# Patient Record
Sex: Female | Born: 1935 | Race: White | Hispanic: No | Smoking: Former smoker
Health system: Southern US, Community
[De-identification: ages and names within clinical notes are randomized; demographics above are authoritative.]

## PROBLEM LIST (undated history)

## (undated) DIAGNOSIS — C50919 Malignant neoplasm of unspecified site of unspecified female breast: Secondary | ICD-10-CM

## (undated) DIAGNOSIS — R200 Anesthesia of skin: Secondary | ICD-10-CM

## (undated) DIAGNOSIS — Z8489 Family history of other specified conditions: Secondary | ICD-10-CM

## (undated) DIAGNOSIS — E785 Hyperlipidemia, unspecified: Secondary | ICD-10-CM

## (undated) DIAGNOSIS — M719 Bursopathy, unspecified: Secondary | ICD-10-CM

## (undated) DIAGNOSIS — F419 Anxiety disorder, unspecified: Secondary | ICD-10-CM

## (undated) DIAGNOSIS — I493 Ventricular premature depolarization: Secondary | ICD-10-CM

## (undated) DIAGNOSIS — M67919 Unspecified disorder of synovium and tendon, unspecified shoulder: Secondary | ICD-10-CM

## (undated) DIAGNOSIS — R001 Bradycardia, unspecified: Secondary | ICD-10-CM

## (undated) DIAGNOSIS — I10 Essential (primary) hypertension: Secondary | ICD-10-CM

## (undated) DIAGNOSIS — IMO0002 Reserved for concepts with insufficient information to code with codable children: Secondary | ICD-10-CM

## (undated) DIAGNOSIS — J189 Pneumonia, unspecified organism: Secondary | ICD-10-CM

## (undated) DIAGNOSIS — M1710 Unilateral primary osteoarthritis, unspecified knee: Secondary | ICD-10-CM

## (undated) DIAGNOSIS — M216X9 Other acquired deformities of unspecified foot: Secondary | ICD-10-CM

## (undated) DIAGNOSIS — N390 Urinary tract infection, site not specified: Secondary | ICD-10-CM

## (undated) DIAGNOSIS — J449 Chronic obstructive pulmonary disease, unspecified: Secondary | ICD-10-CM

## (undated) DIAGNOSIS — M2170 Unequal limb length (acquired), unspecified site: Secondary | ICD-10-CM

## (undated) DIAGNOSIS — M21069 Valgus deformity, not elsewhere classified, unspecified knee: Secondary | ICD-10-CM

## (undated) HISTORY — DX: Valgus deformity, not elsewhere classified, unspecified knee: M21.069

## (undated) HISTORY — DX: Unilateral primary osteoarthritis, unspecified knee: M17.10

## (undated) HISTORY — DX: Ventricular premature depolarization: I49.3

## (undated) HISTORY — DX: Hyperlipidemia, unspecified: E78.5

## (undated) HISTORY — DX: Bradycardia, unspecified: R00.1

## (undated) HISTORY — PX: APPENDECTOMY: SHX54

## (undated) HISTORY — PX: JOINT REPLACEMENT: SHX530

## (undated) HISTORY — PX: TOTAL HIP ARTHROPLASTY: SHX124

## (undated) HISTORY — DX: Unspecified disorder of synovium and tendon, unspecified shoulder: M67.919

## (undated) HISTORY — PX: BREAST SURGERY: SHX581

## (undated) HISTORY — DX: Unspecified disorder of synovium and tendon, unspecified shoulder: M71.9

## (undated) HISTORY — DX: Reserved for concepts with insufficient information to code with codable children: IMO0002

## (undated) HISTORY — DX: Unequal limb length (acquired), unspecified site: M21.70

## (undated) HISTORY — DX: Malignant neoplasm of unspecified site of unspecified female breast: C50.919

## (undated) HISTORY — DX: Other acquired deformities of unspecified foot: M21.6X9

---

## 1989-01-17 HISTORY — PX: BREAST LUMPECTOMY: SHX2

## 2002-10-24 ENCOUNTER — Other Ambulatory Visit: Admission: RE | Admit: 2002-10-24 | Discharge: 2002-10-24 | Payer: Self-pay | Admitting: Physical Therapy

## 2004-01-02 ENCOUNTER — Emergency Department (HOSPITAL_COMMUNITY): Admission: EM | Admit: 2004-01-02 | Discharge: 2004-01-02 | Payer: Self-pay | Admitting: Emergency Medicine

## 2004-01-18 HISTORY — PX: NASAL SINUS SURGERY: SHX719

## 2004-03-11 ENCOUNTER — Encounter: Admission: RE | Admit: 2004-03-11 | Discharge: 2004-03-11 | Payer: Self-pay | Admitting: Obstetrics and Gynecology

## 2004-12-03 ENCOUNTER — Other Ambulatory Visit: Admission: RE | Admit: 2004-12-03 | Discharge: 2004-12-03 | Payer: Self-pay | Admitting: Obstetrics and Gynecology

## 2005-03-24 ENCOUNTER — Encounter: Admission: RE | Admit: 2005-03-24 | Discharge: 2005-03-24 | Payer: Self-pay | Admitting: Internal Medicine

## 2005-09-27 ENCOUNTER — Encounter: Admission: RE | Admit: 2005-09-27 | Discharge: 2005-09-27 | Payer: Self-pay | Admitting: Orthopedic Surgery

## 2005-11-21 ENCOUNTER — Encounter: Admission: RE | Admit: 2005-11-21 | Discharge: 2005-11-21 | Payer: Self-pay | Admitting: Obstetrics and Gynecology

## 2005-12-21 ENCOUNTER — Encounter: Admission: RE | Admit: 2005-12-21 | Discharge: 2005-12-21 | Payer: Self-pay | Admitting: *Deleted

## 2006-01-18 ENCOUNTER — Ambulatory Visit: Admission: RE | Admit: 2006-01-18 | Discharge: 2006-01-18 | Payer: Self-pay | Admitting: Orthopedic Surgery

## 2006-03-23 ENCOUNTER — Encounter: Admission: RE | Admit: 2006-03-23 | Discharge: 2006-03-23 | Payer: Self-pay | Admitting: Obstetrics and Gynecology

## 2006-04-27 ENCOUNTER — Encounter (INDEPENDENT_AMBULATORY_CARE_PROVIDER_SITE_OTHER): Payer: Self-pay | Admitting: Specialist

## 2006-04-27 ENCOUNTER — Observation Stay (HOSPITAL_COMMUNITY): Admission: RE | Admit: 2006-04-27 | Discharge: 2006-04-28 | Payer: Self-pay | Admitting: Otolaryngology

## 2006-05-02 ENCOUNTER — Encounter: Admission: RE | Admit: 2006-05-02 | Discharge: 2006-05-02 | Payer: Self-pay | Admitting: Otolaryngology

## 2006-05-09 ENCOUNTER — Emergency Department (HOSPITAL_COMMUNITY): Admission: EM | Admit: 2006-05-09 | Discharge: 2006-05-09 | Payer: Self-pay | Admitting: Emergency Medicine

## 2006-05-17 ENCOUNTER — Encounter: Admission: RE | Admit: 2006-05-17 | Discharge: 2006-05-17 | Payer: Self-pay | Admitting: Otolaryngology

## 2006-09-25 ENCOUNTER — Inpatient Hospital Stay (HOSPITAL_COMMUNITY): Admission: RE | Admit: 2006-09-25 | Discharge: 2006-09-28 | Payer: Self-pay | Admitting: Orthopedic Surgery

## 2007-03-26 ENCOUNTER — Encounter: Admission: RE | Admit: 2007-03-26 | Discharge: 2007-03-26 | Payer: Self-pay | Admitting: Obstetrics and Gynecology

## 2008-04-09 ENCOUNTER — Encounter: Admission: RE | Admit: 2008-04-09 | Discharge: 2008-04-09 | Payer: Self-pay | Admitting: Obstetrics and Gynecology

## 2008-11-18 ENCOUNTER — Ambulatory Visit: Payer: Self-pay | Admitting: Vascular Surgery

## 2008-11-18 ENCOUNTER — Ambulatory Visit: Admission: RE | Admit: 2008-11-18 | Discharge: 2008-11-18 | Payer: Self-pay | Admitting: Orthopedic Surgery

## 2008-11-18 ENCOUNTER — Encounter (INDEPENDENT_AMBULATORY_CARE_PROVIDER_SITE_OTHER): Payer: Self-pay | Admitting: Orthopedic Surgery

## 2009-01-17 DIAGNOSIS — R200 Anesthesia of skin: Secondary | ICD-10-CM

## 2009-01-17 HISTORY — DX: Anesthesia of skin: R20.0

## 2009-02-24 ENCOUNTER — Inpatient Hospital Stay (HOSPITAL_COMMUNITY): Admission: RE | Admit: 2009-02-24 | Discharge: 2009-02-27 | Payer: Self-pay | Admitting: Orthopedic Surgery

## 2009-04-27 ENCOUNTER — Encounter: Admission: RE | Admit: 2009-04-27 | Discharge: 2009-04-27 | Payer: Self-pay | Admitting: Obstetrics and Gynecology

## 2009-04-27 DIAGNOSIS — J45909 Unspecified asthma, uncomplicated: Secondary | ICD-10-CM | POA: Insufficient documentation

## 2009-09-01 ENCOUNTER — Ambulatory Visit: Payer: Self-pay | Admitting: Sports Medicine

## 2009-09-01 DIAGNOSIS — M719 Bursopathy, unspecified: Secondary | ICD-10-CM

## 2009-09-01 DIAGNOSIS — M2170 Unequal limb length (acquired), unspecified site: Secondary | ICD-10-CM

## 2009-09-01 DIAGNOSIS — M21069 Valgus deformity, not elsewhere classified, unspecified knee: Secondary | ICD-10-CM | POA: Insufficient documentation

## 2009-09-01 DIAGNOSIS — M1710 Unilateral primary osteoarthritis, unspecified knee: Secondary | ICD-10-CM

## 2009-09-01 DIAGNOSIS — M67919 Unspecified disorder of synovium and tendon, unspecified shoulder: Secondary | ICD-10-CM | POA: Insufficient documentation

## 2009-09-01 DIAGNOSIS — M216X9 Other acquired deformities of unspecified foot: Secondary | ICD-10-CM

## 2009-10-08 ENCOUNTER — Ambulatory Visit: Payer: Self-pay | Admitting: Sports Medicine

## 2010-02-07 ENCOUNTER — Encounter: Payer: Self-pay | Admitting: Otolaryngology

## 2010-02-07 ENCOUNTER — Encounter: Payer: Self-pay | Admitting: Obstetrics and Gynecology

## 2010-02-16 NOTE — Assessment & Plan Note (Signed)
Summary: NP,B KNEE ISSUES AND L SHOULDER PAIN   Vital Signs:  Patient profile:   77 year old female Height:      65 inches Weight:      144 pounds BMI:     24.05 BP sitting:   112 / 71  Vitals Entered By: Lillia Pauls CMA (September 01, 2009 9:50 AM)  CC:  Knee Pain.  History of Present Illness: 77 yo F:  1. Knee Pain: bilateral, L > R, x several years. + catching and locking, especially with going down stairs. Hx of left knee arthroscopy. last xray at Dr. Nilsa Nutting office - "not bone on bone yet." patient is very active - exercises at gym 4 days a week, has trainer 2 days a week. some night pain. PMHx of bilateral knee replacements, with resulting shorter left leg for which she wears a lift "most of the time." has had hyaluronic acid injections in left knee.   2. Shoulder Pain: right side, with decreased ROM, was seen by Dr. Charlann Boxer and given an injection about 4 months ago which helped. she is doing exercises prescribed by him for this. no Hx of xrays.  Allergies (verified): No Known Drug Allergies  Review of Systems MS:  Complains of joint pain and joint swelling; denies joint redness, loss of strength, low back pain, and muscle weakness. Neuro:  Denies falling down, numbness, and tingling.  Physical Exam  General:  Well-developed, well-nourished, in no acute distress; alert, appropriate and cooperative throughout examination. Vitals reviewed. Msk:  Knees Inspection: obvious arthritis changes bilaterally. Palpation: mild tenderness over medial joint lines bilaterally L>R. +crepitus bilaterally. ROM: FROM knees and hips. Ligaments: Normal endpoints. Special: by mouth felt with McMurrays on right but no pain. Standing. Genu valgus. + shorter left leg.   Feet: bilateral mild pes planus, bilateral splayed toes 2-3 and forefoot collapse, bilateral pronation.  Left Shoulder Inspection reveals no abnormalities, atrophy or asymmetry. Palpation:  no tenderness over AC joint or bicipital  groove. ROM: decreased abduction and external rotation. Rotator cuff strength decreased external rotation and abduction. No signs of impingement with negative Neer and Hawkin's tests, empty can. Speeds and Yergason's tests normal. No painful arc and no drop arm sign. No apprehension sign.      Impression & Recommendations:  Problem # 1:  DEGENERATIVE JOINT DISEASE, KNEE (ICD-715.96) see instructions  I think we should modify exercises and try to provide better support to lessen leg length diffence and degree of left genu valgum  Problem # 2:  UNEQUAL LEG LENGTH (ICD-736.81) will add lift ot shoes and develop a custom orthotic to correct  Problem # 3:  OTHER ACQUIRED DEFORMITY OF ANKLE AND FOOT OTHER (ICD-736.79) lots of fooot breakdwon consider orthotics  Problem # 4:  GENU VALGUM (ICD-736.41) 2/2 DJD but will try to lessen stress with wedges lifts in other shoes  Problem # 5:  ROTATOR CUFF SYNDROME, LEFT (ICD-726.10)  Patient Instructions: 1)  Exercises to do daily: See Handout 2)  Try the new heel lift for a few weeks. Come back for custom orthotics. We want to try to correct more of your leg length difference. Bring shoes that you like to wear at the next visit. 3)  Alternate 2 aerobic execises - treadmill, semi-recumbent bike (works your quads), elliptical (works your hips). Don't use an upright bike.  4)  Don't do full squats. No full quad extension. 5)  Keep knee flexion in the 30 degree range - no more that 45 degrees. 6)  Avoid the bent knee quad extension exercises with weights.

## 2010-02-16 NOTE — Assessment & Plan Note (Signed)
Summary: F/U,MC   Vital Signs:  Patient profile:   77 year old female BP sitting:   136 / 80  Vitals Entered By: Lillia Pauls CMA (October 08, 2009 3:16 PM)  History of Present Illness: Patient returns for follow  up  Left shoulder pain:  working motion  and definitely has improved this works with trainer who  is helping a lot does the home motion exercises pain is 30% less  Bilat knee pain wearing lift on left this has helped reduce knee pain w walking and standing avoiding any exercises w deep knee bend and this helps feels that recumbent bike has helped reduce pain at least 20%  Allergies: No Known Drug Allergies  Physical Exam  General:  Well-developed,well-nourished,in no acute distress; alert,appropriate and cooperative throughout examination Msk:  Left shoulder now within 10 deg of full flexion  can do full abduction and  elevation with some rotation of left scapula but mostly pain free IR is only slt limited on back scratch on left  ER this is 20 deg more but still somewhat uncomfortabel when she is abducted and ER  both knees show crepitation but less pain on testing  gait shows genu valgus bilat this improves with wege for relative leg length diff     Impression & Recommendations:  Problem # 1:  ROTATOR CUFF SYNDROME, LEFT (ICD-726.10) mproving keep working ROM w codman exercises  Problem # 2:  GENU VALGUM (ICD-736.41) lessen as much s possible w shoe inserts  doing well so we withheld going to custom orthotics today and will keep using lifts  Problem # 3:  UNEQUAL LEG LENGTH (ICD-736.81) felt lifts added to heels lift with taper added to left shoe and will give more of these when they come in  reck this in 4 mos

## 2010-03-19 ENCOUNTER — Other Ambulatory Visit: Payer: Self-pay | Admitting: Obstetrics and Gynecology

## 2010-03-19 DIAGNOSIS — Z1231 Encounter for screening mammogram for malignant neoplasm of breast: Secondary | ICD-10-CM

## 2010-03-22 ENCOUNTER — Inpatient Hospital Stay (HOSPITAL_BASED_OUTPATIENT_CLINIC_OR_DEPARTMENT_OTHER)
Admission: RE | Admit: 2010-03-22 | Discharge: 2010-03-22 | Disposition: A | Discharge status: Home / Self Care | Payer: Medicare Other | Source: Ambulatory Visit | Attending: Cardiology | Admitting: Cardiology

## 2010-03-22 DIAGNOSIS — R0989 Other specified symptoms and signs involving the circulatory and respiratory systems: Secondary | ICD-10-CM | POA: Insufficient documentation

## 2010-03-22 DIAGNOSIS — R0609 Other forms of dyspnea: Secondary | ICD-10-CM | POA: Insufficient documentation

## 2010-03-22 DIAGNOSIS — R9439 Abnormal result of other cardiovascular function study: Secondary | ICD-10-CM | POA: Insufficient documentation

## 2010-03-22 DIAGNOSIS — R0602 Shortness of breath: Secondary | ICD-10-CM | POA: Insufficient documentation

## 2010-03-22 DIAGNOSIS — I10 Essential (primary) hypertension: Secondary | ICD-10-CM | POA: Insufficient documentation

## 2010-03-26 NOTE — Procedures (Signed)
NAME:  [name] [name], [name] [name]              ACCOUNT NO.:  [id number]  MEDICAL RECORD NO.:  [id number]           PATIENT TYPE:  LOCATION:                                 FACILITY:  PHYSICIAN:  Armanda Magic, M.D.          DATE OF BIRTH:  DATE OF PROCEDURE:  03/22/2010 DATE OF DISCHARGE:                           CARDIAC CATHETERIZATION   REFERRING PHYSICIAN:  Mark A. Perini, MD  PROCEDURES: 1. Left heart catheterization. 2. Coronary angiography. 3. Left ventriculography.  OPERATOR:  Armanda Magic, MD  INDICATION:  Shortness of breath.  COMPLICATIONS:  None.  INTRAVENOUS ACCESS:  Via right femoral artery, 4-French sheath.  INTRAVENOUS MEDICATIONS:  Versed 1 mg and fentanyl 25 mcg.  This is a 77 year old female, who has a history of asthma in the past as well as PVCs and hypertension, who has been having some mild dyspnea on exertion.  Her nuclear stress test showed a very small defect in the anterior apical region, felt most consistent with a breast attenuation artifact, but because of her ongoing shortness of breath, she now presents for cardiac catheterization.  The patient was brought to the cardiac catheterization laboratory in a fasting nonsedated state.  Informed consent was obtained.  The patient was connected to continuous heart rate and pulse oximetry monitoring and blood pressure monitoring.  The right groin was prepped and draped in a sterile fashion.  A 1% Xylocaine was used for local anesthesia.  Using modified Seldinger technique, a 4-French sheath was placed in the right femoral artery.  Under fluoroscopic guidance, a 4-French JL-4 catheter was placed in the left coronary artery.  Multiple cine films were taken at 30-degree RAO and 40-degree LAO views.  This catheter was then exchanged out over a guidewire for a 4-French 3DRC catheter successfully engaging the right coronary ostium.  Multiple cine films were taken at 30-degree RAO and 40-degree LAO views.  This  catheter was then exchanged out over a guidewire for a 4-French angled pigtail catheter which was placed under fluoroscopic guidance in the left ventricular cavity.  The left ventriculography was performed in the 30-degree RAO view using total 25 mL of contrast at 12 mL per second.  The catheter was then pulled back across the aortic valve with no significant gradient noted. At the end of the procedure, all catheters and sheaths were removed. Manual compression was performed until adequate hemostasis was obtained. The patient was transferred back to room in stable condition.  RESULTS:  Left main coronary artery is widely patent and bifurcates into left anterior descending artery and left circumflex artery.  Left anterior descending artery is widely patent throughout its course and gives rise to a large first diagonal branch which is widely patent throughout its course.  Left circumflex is widely patent throughout its course in the AV groove. It gives rise to a high obtuse marginal branch 1 which is a very large vessel and is widely patent and bifurcates into two daughter vessels, both of which are widely patent.  The ongoing circumflex gives rise to a second small obtuse marginal branch and terminates in a third large obtuse marginal  branch which is widely patent.  The right coronary artery is widely patent and bifurcates distally into a posterior descending artery and posterior lateral artery, both of which are widely patent.  Left ventricular pressure 123/11 mmHg, aortic pressure 121/53 mmHg, LVEDP 18 mmHg, LV function normal with EF 55%.  ASSESSMENT: 1. Normal coronary arteries. 2. Normal left ventricular function. 3. Mildly elevated left ventricular end-diastolic pressure with     diastolic dysfunction.  PLAN:  Discharge home after IV fluid and bedrest.  We will continue current medications and add a low dose of hydrochlorothiazide 25 mg daily to help with her elevated  LVEDP.  I am going to drop back her lisinopril to just 10 mg a day from 15 mg a day because her blood pressure has been borderline low in the past and at the last office visit, her blood pressure was 104/58 mmHg.  She will remain on diltiazem 180 mg daily and Toprol-XL 25 mg 3 tablets daily, both for treatment of diastolic dysfunction as well as for hypertension control.  Again, we will drop her lisinopril back 10 mg one tablet a day and start hydrochlorothiazide 25 mg a day.  She will follow up with my nurse practitioner in 2 weeks.     Armanda Magic, M.D.     TT/MEDQ  D:  03/22/2010  T:  03/22/2010  Job:  [id number]  cc:   Loraine Leriche A. Perini, M.D.  Electronically Signed by Armanda Magic M.D. on 03/25/2010 04:20:00 PM

## 2010-03-30 ENCOUNTER — Other Ambulatory Visit: Payer: Self-pay | Admitting: Obstetrics and Gynecology

## 2010-04-02 ENCOUNTER — Ambulatory Visit
Admission: RE | Admit: 2010-04-02 | Discharge: 2010-04-02 | Disposition: A | Discharge status: Home / Self Care | Payer: Medicare Other | Source: Ambulatory Visit | Attending: Obstetrics and Gynecology | Admitting: Obstetrics and Gynecology

## 2010-04-02 ENCOUNTER — Other Ambulatory Visit: Payer: Self-pay | Admitting: Obstetrics and Gynecology

## 2010-04-02 DIAGNOSIS — N6459 Other signs and symptoms in breast: Secondary | ICD-10-CM

## 2010-04-07 LAB — CBC
HCT: 29 % — ABNORMAL LOW (ref 36.0–46.0)
HCT: 29.7 % — ABNORMAL LOW (ref 36.0–46.0)
Hemoglobin: 10.2 g/dL — ABNORMAL LOW (ref 12.0–15.0)
Hemoglobin: 14.2 g/dL (ref 12.0–15.0)
MCHC: 34 g/dL (ref 30.0–36.0)
MCV: 97.2 fL (ref 78.0–100.0)
MCV: 97.6 fL (ref 78.0–100.0)
Platelets: 140 10*3/uL — ABNORMAL LOW (ref 150–400)
Platelets: 159 10*3/uL (ref 150–400)
Platelets: 251 10*3/uL (ref 150–400)
RBC: 4.34 MIL/uL (ref 3.87–5.11)
RDW: 13.4 % (ref 11.5–15.5)
WBC: 9.3 10*3/uL (ref 4.0–10.5)
WBC: 9.5 10*3/uL (ref 4.0–10.5)

## 2010-04-07 LAB — BASIC METABOLIC PANEL
BUN: 13 mg/dL (ref 6–23)
BUN: 29 mg/dL — ABNORMAL HIGH (ref 6–23)
BUN: 8 mg/dL (ref 6–23)
CO2: 30 mEq/L (ref 19–32)
Chloride: 105 mEq/L (ref 96–112)
Chloride: 107 mEq/L (ref 96–112)
Chloride: 99 mEq/L (ref 96–112)
Creatinine, Ser: 0.71 mg/dL (ref 0.4–1.2)
Creatinine, Ser: 0.94 mg/dL (ref 0.4–1.2)
GFR calc Af Amer: >60 mL/min (ref >60)
GFR calc non Af Amer: >60 mL/min (ref >60)
Glucose, Bld: 128 mg/dL — ABNORMAL HIGH (ref 70–99)
Glucose, Bld: 86 mg/dL (ref 70–99)
Glucose, Bld: 93 mg/dL (ref 70–99)
Potassium: 4.4 mEq/L (ref 3.5–5.1)
Potassium: 4.4 mEq/L (ref 3.5–5.1)
Potassium: 4.7 mEq/L (ref 3.5–5.1)
Sodium: 135 mEq/L (ref 135–145)
Sodium: 138 mEq/L (ref 135–145)

## 2010-04-07 LAB — DIFFERENTIAL
Basophils Absolute: 0 10*3/uL (ref 0.0–0.1)
Basophils Relative: 0 % (ref 0–1)
Eosinophils Absolute: 0.1 10*3/uL (ref 0.0–0.7)
Lymphocytes Relative: 16 % (ref 12–46)
Lymphs Abs: 1.4 10*3/uL (ref 0.7–4.0)
Neutro Abs: 6.9 10*3/uL (ref 1.7–7.7)

## 2010-04-07 LAB — URINALYSIS, ROUTINE W REFLEX MICROSCOPIC
Nitrite: NEGATIVE
pH: 6 (ref 5.0–8.0)

## 2010-04-07 LAB — URINE MICROSCOPIC-ADD ON

## 2010-04-07 LAB — PROTIME-INR
INR: 0.99 (ref 0.00–1.49)
Prothrombin Time: 13 seconds (ref 11.6–15.2)

## 2010-05-03 ENCOUNTER — Ambulatory Visit: Payer: Medicare Other

## 2010-05-03 ENCOUNTER — Ambulatory Visit
Admission: RE | Admit: 2010-05-03 | Discharge: 2010-05-03 | Disposition: A | Discharge status: Home / Self Care | Payer: Medicare Other | Source: Ambulatory Visit | Attending: Obstetrics and Gynecology | Admitting: Obstetrics and Gynecology

## 2010-05-03 ENCOUNTER — Other Ambulatory Visit: Payer: Self-pay | Admitting: Diagnostic Radiology

## 2010-05-03 ENCOUNTER — Other Ambulatory Visit: Payer: Self-pay | Admitting: Obstetrics and Gynecology

## 2010-05-03 DIAGNOSIS — N6459 Other signs and symptoms in breast: Secondary | ICD-10-CM

## 2010-06-01 NOTE — Op Note (Signed)
NAME:  [name] [name], [name] [name]              ACCOUNT NO.:  [id number]   MEDICAL RECORD NO.:  [id number]          PATIENT TYPE:  INP   LOCATION:  1616                         FACILITY:  [facility]   PHYSICIAN:  Madlyn Frankel. Charlann Boxer, M.D.  DATE OF BIRTH:  [birth year]   DATE OF PROCEDURE:  09/25/2006  DATE OF DISCHARGE:                               OPERATIVE REPORT   PREOPERATIVE DIAGNOSIS:  Left hip end-stage osteoarthritis with large  acetabular cyst.   POSTOPERATIVE DIAGNOSIS:  Left hip end-stage osteoarthritis with large  acetabular cyst.   PROCEDURE:  Left total hip replacement associated with autograft of the  pelvis of the acetabular cyst.   COMPONENTS USED:  DePuy hip system with a size 54 pinnacle cup, a 32  neutral Marathon liner, a 6 high offset Tri-Lock stem with a 32+1 Delta  screw with ball.   SURGEON:  Charlann Boxer.   ASSISTANT:  Dwyane Luo, P.A.C.   ANESTHESIA:  Spinal plus MAC.   BLOOD LOSS:  250.   COMPLICATIONS:  None.   DRAINS:  None.   INDICATIONS FOR PROCEDURE:  [name] [name] is a 77 year old female who had  been followed for left hip arthritis for a couple of years.  She had  been trying to delay it as much as possible.  She decided about 6 to 8  months ago that she wanted to have this done but was having problems  with chronic sinuses.  Once this was addressed and treated  appropriately, she wished to proceed with this as soon as possible.  We  had reviewed the risks of infection, dislocation, DVT, component  failure, need for revision surgery.  All prior consent was obtained for  this.   PROCEDURE IN DETAIL:  The patient was brought to the operative theater.  Once adequate anesthesia and preoperative antibiotics administered, 1 g  of Ancef, the patient was positioned in the right lateral decubitus  position with the left side up.  The left lower extremity was then  prescrubbed and prepped and draped in a sterile fashion.   A lateral-based incision was made for a posterior  approach to the hip.  The iliotibial band and gluteus fascia were incised posteriorly.  The  short external rotators were identified and taken down separately from  the posterior capsule.  A L capsulotomy was then created and the hip  dislocated.  Landmarks were identified and then based off of  preoperative templating and the anatomic landmarks a neck osteotomy was  made.   Attention was first directed to the femur with starting reamer followed  by a hand reamer, then irrigating the canal to prevent fat emboli.  I  began broaching with a 0 broach and carried it all the way up to a size  6.  The size 6 sat a millimeter or two beneath my neck cut and on the  medial aspect I used a calcar planar to finish this off.  The femoral  canal was then packed and attention directed to the acetabulum.  Acetabular reaming began with a 45 reamer and it was carried up to a 51  reamer initially.  There was a large acetabular cyst noted radiographically and this was  identified intraoperatively.  There was a very large cyst measuring  about 1.5 cm wide and then probably about 1 cm deep.  I curetted this  out, removed all cystic lining.  I irrigated it and then packed it with  femoral head allograft and reamed femoral head out autograft.   This was packed and significant bone graft utilized.  At this point I  reamed again with a 53 reamer, utilized some of this reaming to fill in  further.   At this point a 54 pinnacle cup was impacted at 40 degrees of abduction  and at 20 degrees of forward flexion, anatomically positioned  posteriorly anteriorly beneath the anterior rim.   At this point a single cancellous screw was placed.  I then placed a  neutral Marathon liner.  Attention was now redirected back to the femur  where the fixed high offset stem was then impacted.  A trial reduction  was carried out with the high offset stem 32+1 ball.  Hip range of  motion was noted to be very stable, tolerating  internal rotation to 80  degrees.  There was a few millimeters of shuck, but the patient did have  spinal anesthesia and compared to her contralateral leg her leg lengths  were unchanged.  Patient was stable with extension on external rotation  as well as with abduction on external rotation.  At this point trial  components were removed and the hole eliminator placed.  The cup was  irrigated and the final 32 neutral Marathon liner placed.  On the  femoral side the 6 high offset stem was then impacted to the level with  neck cut and based on this and my trial reduction, a 32+1 Delta ceramic  ball was impacted onto the trunnion and the hip reduced.  The hip was  irrigated.  The posterior capsule was reapproximated superiorly using #1  Ethibond.  FloSeal 5 mL was injected into the posterior capsular  tissues.  The iliotibial band was reapproximated using #1 Ethibond, #1  Vicryl was then run in the gluteal fascia.  The remainder was closed  with #2-0 Vicryl and a running #4-0 Monocryl.  The hip was cleaned,  dried and dressed sterilely with Steri-Strips and a Mepilex dressing.  She was brought to the recovery room in stable condition.      Madlyn Frankel Charlann Boxer, M.D.  Electronically Signed     MDO/MEDQ  D:  09/25/2006  T:  09/25/2006  Job:  [id number]

## 2010-06-01 NOTE — Discharge Summary (Signed)
NAME:  [name] [name], [name] [name]              ACCOUNT NO.:  [id number]   MEDICAL RECORD NO.:  [id number]          PATIENT TYPE:  INP   LOCATION:  1616                         FACILITY:  [facility]   PHYSICIAN:  Madlyn Frankel. Charlann Boxer, M.D.  DATE OF BIRTH:  October 15, 1930   DATE OF ADMISSION:  09/25/2006  DATE OF DISCHARGE:  09/28/2006                               DISCHARGE SUMMARY   DISCHARGE DIAGNOSES:  1. Left hip osteoarthritis.  2. Anxiety.  3. Depression.  4. Hypertension.  5. Reflux disease.  6. Breast cancer.   PROCEDURE:  On September 25, 2006, the patient underwent total hip  replacement by Dr. Charlann Boxer.  Components used were DuPuy hip system.   HISTORY OF PRESENT ILLNESS:  [name] [name] is a 77 year old female who I  have followed in the office for a couple of years for left hip pain.  She had progressive worsening of symptoms, decreased quality of life.  Following workup for sinus issues, chronic sinus problems, she decided  to proceed with surgical intervention.  Risks and benefits have been  discussed and consent obtained prior to the hospitalization.   HOSPITAL COURSE:  The patient was admitted for same day surgery on  September 25, 2006.  She underwent a left total hip replacement which was  uncomplicated.  She was transferred to the orthopedic floor  postoperatively.  Based on the perioperative blood loss anemia, she did  require two units of blood on postoperative day #1 for a postoperative  hematocrit of 23.5.  On postoperative day #2, her hematocrit had  elevated appropriately to 29.5.  She was seen and evaluated by physical  therapy and mobilized without difficulty.  She had no hospital  complications.  On postoperative day #2 her dressing was changed and  found to be dry.  She remained neurovascularly intact, motor and  sensory.   Based on the social situation of her living alone, we opted for short  term nursing facility.  Case management was involved in helping with  placement.   DISCHARGE INSTRUCTIONS:  The patient will be weightbearing as tolerated  left lower extremity with a walker.  Hip precautions will be followed  for posterior hip surgery with no flexion greater than 90 degrees, no  internal rotation, and no adduction.   She will progress with a walker until further instructed in our office.   DISCHARGE MEDICATIONS:  1. Cardia XT 180 mg q.h.s.  2. Zoloft 50 mg q.a.m.  3. Metoprolol 25 mg three tablets in the morning.  4. Lipitor 10 mg q.a.m.  5. Lisinopril 10 mg 1-1/2 tablets q.a.m.  6. Vitamin D 50,000 International Units weekly on Monday.  7. Evista 60 mg q.a.m.  8. Vicodin 5/500 one to two tablets p.o. q.4-6 hours p.r.n. for pain.  9. Robaxin 500 mg p.o. q.i.d. p.r.n. for muscle spasms and pain.  10.Lovenox 40 mg subcu daily for a total of 11 days to end on      October 09, 2006, transitioning at that point to enteric-coated      aspirin 325 mg daily for four weeks.  11.Colace 100 mg p.o. b.i.d. p.r.n.  for constipation.  12.MiraLax 17 grams p.o. daily p.r.n. for constipation.  13.Iron sulfate 325 mg p.o. t.i.d. x3 weeks.   DISCHARGE INSTRUCTIONS:  She may bathe as long as her wound remains dry.  She will follow up in my office in two weeks for wound check and to  check on progress.  She knows to notify the office for any problems or  concerns that she may have including fevers over 102.5, wound drainage,  or persistent pain.      Madlyn Frankel Charlann Boxer, M.D.  Electronically Signed     MDO/MEDQ  D:  09/28/2006  T:  09/28/2006  Job:  [id number]

## 2010-06-01 NOTE — H&P (Signed)
NAME:  [name] [name], [name] [name]              ACCOUNT NO.:  [id number]   MEDICAL RECORD NO.:  [id number]          PATIENT TYPE:  INP   LOCATION:  NA                           FACILITY:  [facility]   PHYSICIAN:  Madlyn Frankel. Charlann Boxer, M.D.  DATE OF BIRTH:  Apr 20, 1930   DATE OF ADMISSION:  DATE OF DISCHARGE:                              HISTORY & PHYSICAL   DATE OF ADMISSION:  September 25, 2006.   PROCEDURE:  Left total hip arthroplasty.   CHIEF COMPLAINT:  Left hip pain.   HISTORY OF PRESENT ILLNESS:  A 77 year old female with a history of left  hip pain secondary to osteoarthritis.  It has been refractory to all  conservative treatments.  She has been presurgically assessed by Dr.  Waynard Edwards and Dr. Patterson Hammersmith.  She had been scheduled in the past, but due to  some persistent and chronic sinusitis, surgery had to be cancelled.  Prior to this surgery, Dr. Charlann Boxer spoke with Dr. Patterson Hammersmith and she was placed  on prophylactic Cipro before this surgery to alleviate any sinusitis.   PAST MEDICAL HISTORY:  Significant for:  1. Osteoarthritis.  2. Anxiety/depression.  3. Hypertension.  4. Reflux disease.  5. Breast cancer.   PAST SURGICAL HISTORY:  Include:  1. Arthroscopic left knee in 2007.  2. Sinus surgery 2008.  3. Breast lumpectomy.   FAMILY HISTORY:  Heart disease, hypertension, arthritis.   SOCIAL HISTORY:  Widowed.  Primary caregiver after surgery, she is  talking about using [facility] rather than SNF placement.   DRUG ALLERGIES:  No known drug allergies.   MEDICATIONS:  1. Toprol-XL 75 mg p.o. daily.  2. Zestril 15 mg p.o. daily.  3. Cardia 180 mg p.o. daily.  4. Zoloft 50 mg p.o. daily.  5. Lipitor 10 mg p.o. daily.  6. Evista p.o. daily.  7. Vitamin D 50,000 units once a week.  8. Cipro 750 mg p.o. b.i.d. 1 week prior to surgery.   REVIEW OF SYSTEMS:  PULMONARY:  History pneumonia 30 years ago.  GASTROINTESTINAL:  History of reflux and hemorrhoids sometimes.  HEMATOLOGY/ONCOLOGY:  Easily  bruises.  Otherwise, see HPI.   PHYSICAL EXAMINATION:  Pulse 72, respirations 18, blood pressure 138/82.  GENERAL:  Awake, alert and oriented, well-developed, well-nourished, no  acute distress.  NECK:  Supple.  No carotid bruits.  CHEST:  Lungs clear to auscultation bilaterally.  BREASTS:  Deferred.  HEART:  Regular rate and rhythm without gallops, clicks, rubs or  murmurs.  ABDOMEN:  Soft, nontender, nondistended.  Bowel sounds present.  GENITOURINARY:  Deferred.  EXTREMITIES:  Left hip has increased pain with internal range of motion.  SKIN:  She has an old bruise from a fall approximately six weeks ago  that is healing well.  NEUROLOGIC:  Intact distal sensibilities.   LABORATORIES:  CBC showed her hemoglobin and hematocrit be 13.2 and 38.3  respectively, platelets 251, white blood cells 5.8.  Basic metabolic:  Glucose 87, sodium 161, potassium 4.3, BUN 20, creatinine 0.8.  Liver  all within normal limits.  TSH 1.19.  UA negative.  EKG, chest x-ray  pending, including  urinalysis and coags.   IMPRESSION:  Left hip osteoarthritis.   PLAN OF ACTION:  Left total hip arthroplasty September 25, 2006, at  [facility] by surgeon Dr. Durene Romans.  Risks and  complications were discussed.  Questions were encouraged, answered and  reviewed.   At time of history and physical postoperative medications including  Lovenox, Robaxin, iron, aspirin, Colace and MiraLax provided.  Pain  medication will be provided at time of surgery.     ______________________________  [name] [name], [place]      Madlyn Frankel. Charlann Boxer, M.D.  Electronically Signed    BLM/MEDQ  D:  09/14/2006  T:  09/15/2006  Job:  [id number]   cc:   Loraine Leriche A. Perini, M.D.  Fax: [phone]

## 2010-06-04 NOTE — H&P (Signed)
NAME:  [name] [name], [name] [name]              ACCOUNT NO.:  [id number]   MEDICAL RECORD NO.:  [id number]          PATIENT TYPE:  INP   LOCATION:  NA                           FACILITY:  [facility]   PHYSICIAN:  Madlyn Frankel. Charlann Boxer, M.D.  DATE OF BIRTH:  1930/07/18   DATE OF ADMISSION:  01/24/2006  DATE OF DISCHARGE:                              HISTORY & PHYSICAL   PROCEDURE:  Left total hip replacement.   CHIEF COMPLAINT:  Left hip pain.   HISTORY OF PRESENT ILLNESS:  This is a 77 year old female with  persistent left hip pain secondary to degenerative joint disease.  Conservative measures have failed to provide significant relief.  She  also has a history of spinal stenosis and disk compression history as  well.  Due to the fact that there has been persistent pain and decreased  activities of daily living, she has elected to proceed forward with a  left total hip replacement due to the fact that there is no other  treatments that can provide significant relief.   PAST MEDICAL HISTORY:  1. Hypertension.  2. Anxiety.  3. Gastroesophageal reflux disease.  4. Urinary tract infection.  5. Breast cancer.  6. Osteoarthritis.  7. Postmenopausal.   PAST SURGICAL HISTORY:  Past surgical history includes a lumpectomy in  1994 and right knee arthroscopic surgery in July2007.   FAMILY HISTORY:  Coronary artery disease, hypertension.   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  Her niece  will be in town to help with her caregiving postoperatively.   DRUG ALLERGIES:  NO KNOWN DRUG ALLERGIES.   MEDICATIONS:  1. Toprol 100 mg one p.o. daily.  2. Zestril 15 mg one p.o. daily.  3. Cardizem 180 mg one p.o. daily.  4. Zoloft 50 mg one p.o. daily.  5. Lipitor 10 mg one p.o. daily.  6. Vitamin D 1000 with calcium.  7. Centrum Silver.  8. Flaxseed oil.   REVIEW OF SYSTEMS:  In the past 2 weeks she has had no new signs or  symptoms of any of her cardiovascular, respiratory,  gastrointestinal,  genitourinary, neurological, musculoskeletal systems.   PHYSICAL EXAMINATION:  VITAL SIGNS:  Temperature 98.3, pulse 60,  respirations 18, blood pressure 124/68.  GENERAL:  This is an awake, alert and oriented, well-developed, well-  nourished 77 year old female in no acute distress.  NECK:  No carotid bruits.  No lymphadenopathy.  CHEST:  Lungs clear to auscultation bilaterally.  BREASTS:  Deferred.  HEART:  Regular rate and rhythm without gallops, clicks, rubs or  murmurs.  ABDOMEN:  Soft, nontender, nondistended.  Bowel sounds present in all  four quadrants.  GENITOURINARY:  Deferred.  EXTREMITIES:  She has painful range of motion, diffuse tenderness.  SKIN:  Dorsalis pedis pulse is positive.  No skin breakdown.  NEUROLOGIC:  Intact sensibilities distally.   LABORATORIES:  Labs are pending January 18, 2006 [facility] presurgical  appointment.   X-rays were reviewed with the patient which show end-stage degenerative  joint disease of her left hip.   IMPRESSION:  Left hip advanced degenerative joint disease.  PLAN OF ACTION:  Left total hip replacement on January 24, 2006, which is  her birthday, by surgeon Dr. Durene Romans.  Risks and complications were  discussed.  All questions were encouraged, answered and reviewed.  Look  forward to treating this very pleasant 77 year old female.     ______________________________  Yetta Glassman. [address], [place]      Madlyn Frankel. Charlann Boxer, M.D.  Electronically Signed    BLM/MEDQ  D:  12/28/2005  T:  12/28/2005  Job:  [id number]

## 2010-06-04 NOTE — Consult Note (Signed)
NAME:  [name] [name], [name] [name]              ACCOUNT NO.:  [id number]   MEDICAL RECORD NO.:  [id number]          PATIENT TYPE:  EMS   LOCATION:  MAJO                         FACILITY:  [facility]   PHYSICIAN:  Antony Contras, MD     DATE OF BIRTH:  [birth year]   DATE OF CONSULTATION:  05/09/2006  DATE OF DISCHARGE:                                 CONSULTATION   REQUESTING SERVICE:  Emergency department.   CHIEF COMPLAINT:  Nose bleed.   HISTORY OF PRESENT ILLNESS:  The patient is a 77 year old white female  who underwent sinonasal surgery by Dr. Gerilyn Pilgrim on April 27, 2006 including  septoplasty, turbinate reductions, and bilateral sinus surgery.  She did  quite well after surgery until 4:30 this morning when she awoke with  severe bleeding from her right nose.  She came to the emergency  department where bleeding was controlled with cocaine pledgets.  Currently, she is not bleeding and has calmed down.  She has no  particular complaints presently except her nose is obstructed.   PAST MEDICAL HISTORY:  1. Hypertension.  2. Anxiety.  3. GERD.  4. UTI.  5. Breast cancer.  6. Osteoarthritis.   PAST SURGICAL HISTORY:  1. As above.  2. Lumpectomy.  3. Right knee laparoscopic surgery.   MEDICATIONS:  Toprol, Zestril, Cardizem, Zoloft, Lipitor, vitamin E,  centrum silver, flaxseed oil.   ALLERGIES:  NO KNOWN DRUG ALLERGIES.   FAMILY HISTORY:  Coronary artery disease, hypertension.   SOCIAL HISTORY:  The patient is widowed.  She denies smoking or alcohol  use.   REASON FOR ADMISSION:  Negative except as listed above.   PHYSICAL EXAMINATION:  VITAL SIGNS:  Pulse 72, blood pressure 138/77,  respirations 20.  GENERAL:  The patient is in no acute distress.  She is pleasant and  cooperative in the emergency department.  HEAD AND FACE:  There are no abnormalities of the head and face.  EARS:  External ears are normal.  External canals are patent.  NOSE:  The external nose is normal.  By anterior  rhinoscopy, there is  clot filling both nasal passages.  The left side clot was partially  removed with suction until no more could be removed effectively.  The  right side clot was not disturbed.  No active bleeding was seen.  Septum  is relatively midline.  MOUTH:  Oral cavity is normal.  There is a clot of blood in the  posterior pharynx.  NECK:  Neck is nontender with no abnormality.  LYMPHATICS:  There is no lymphadenopathy.  CRANIAL NERVES:  II-XII grossly intact.  VOICE:  Voice is normal.   LABORATORY DATA:  White blood count 9.9, hemoglobin 11, platelet count  232,000.  PT 12.8, INR 1, PTT 28.   ASSESSMENT:  1. The patient is a 77 year old white female with right epistaxis 12      days following sinonasal surgery.  2. Post-hemorrhagic anemia.   PLAN:  The bleeding has been effectively controlled by the emergency  room staff with cocaine pledgets.  I have instructed the patient to  completely avoid blowing for  the next couple of weeks.  I have asked her  to use Afrin nasal spray in both nasal passages twice daily for 3 days.  I have also asked her to use saline nasal spray every 4 hours in both  sides.  I have asked her to avoid strenuous activity.  She has a  scheduled appointment tomorrow for follow up regarding her sinonasal  surgery, and I have asked her to keep that appointment.  Regarding her  anemia, I have prescribed ferrous sulfate 325 mg three times a day for  one month, and folic acid 0.1 mg once daily for one month.  I have  instructed her to watch for signs of symptomatic anemia including light-  headedness, heart racing, and syncope.      Antony Contras, MD  Electronically Signed     DDB/MEDQ  D:  05/09/2006  T:  05/09/2006  Job:  [id number]

## 2010-06-04 NOTE — Op Note (Signed)
NAME:  [name] [name]              ACCOUNT NO.:  [id number]   MEDICAL RECORD NO.:  [id number]          PATIENT TYPE:  OBV   LOCATION:  3309                         FACILITY:  [facility]   PHYSICIAN:  Lucky Cowboy, MD         DATE OF BIRTH:  1931/01/06   DATE OF PROCEDURE:  04/27/2006  DATE OF DISCHARGE:                               OPERATIVE REPORT   PREOPERATIVE DIAGNOSES:  1. Left septal deviation.  2. Bilateral inferior turbinate hypertrophy.  3. Chronic bilateral frontal sinusitis.   POSTOPERATIVE DIAGNOSES:  1. Left septal deviation.  2. Bilateral inferior turbinate hypertrophy.  3. Chronic bilateral frontal sinusitis.   PROCEDURE:  Septoplasty, bilateral inferior turbinate reductions, left  total ethmoidectomy, right anterior ethmoidectomy, bilateral frontal  recess explorations, image guidance using Stealth system. Reduction of  both of the middle turbinates with reduction of right concha bullosa.   COMPLICATIONS:  None.   INDICATIONS:  The patient is a 77 year old female with development of  severe bilateral frontal pain and recent episode of profuse left-sided  epistaxis requiring pack placement.  CT scan in the office has revealed  substantial ongoing sinus infection despite extended medical therapy and  steroid therapy.  For these reasons, the above procedures are performed.   PROCEDURE:  The patient was taken to the operating room and placed on  the table in the supine position. She was then placed under endotracheal  anesthesia and rotated clockwise. The neck was gently rotated to the  right.  The septum was injected with 1% lidocaine with 1:100,000  epinephrine.  The face was prepped with Betadine and draped in the  sterile fashion.  After allowing time for vasoconstrictive effect, a  left hemitransfixion incision was made using a #15 blade.  Submucoperichondrial and mucoperiosteal flaps were elevated using a  Risk analyst and a Therapist, nutritional.  The bony  cartilaginous junction  was divided using a Therapist, nutritional and contralateral flaps elevated.  The septum was divided high with open Laren Boom forceps.  Inferiorly, it was taken down using Takahashi forceps.  The anterior  septum was quite deviated to the left as this was the location of the  maxillary crest.  The cartilaginous septum was disconnected from the  underlying bony crest.  The bony crest was partially taken down using a  4-mm osteotome.  This allowed the septum to remain deviated more toward  the midline.  At this point, the inferior 1-2 mm were taken down off of  the quadrangular  cartilage to allow permanent placement more towards  the midline. This allowed much better visualization of the left nasal  cavity.  The Stealth image guided system was applied.  The zero degree  Storz-Hopkins endoscope was used to visualize the left nasal cavity and  1% lidocaine with 1:100,000 of epinephrine used to inject the left  lateral nasal wall in the area of the uncinate process and the  turbinate.  The backbiting forceps was used to take down the mid portion  of the uncinate process.  There was an accessory ostia identified and  connected with the natural ostia  using the backbiting forceps and the  microdebrider.  Very thick mucoid fluid was suctioned out of the left  maxillary sinus.  Once this was performed, the middle turbinate was  medialized and the superior portion taken down with the microdebrider  and Tru-cut forceps.  The ethmoid bulla was entered with the straight  image guided system using the Stealth type system.  Then the  microdebrider was used to free the ethmoid bulla and ethmoid air cells.  The posterior air cells were also was removed in a inferior direction.  This was performed, the frontal recess was identified.  There appeared  to be a very tiny tract from this to the opacified portion of the  frontal sinus.  There was a very hard dense bone which prevented  entry  into the frontal sinus.  This was performed using image guidance while  using the 30 degree Storz-Hopkins endoscope.  Attempts were made to  break through the area and also to use the microdebrider.  These were  not successful and it was decided to defer this until a later date so  that this could be discussed with the patient and the drill for this  region obtained.  At this point, attention was then turned to the right  nasal cavity.  In a similar fashion, the nasal cavity was decongested  with Afrin on cottonoid pledgets and 1% lidocaine with 1:100,000 of  epinephrine used to inject the uncinate process and the middle  turbinate. The concha bullosa was taken down by a vertical incision in  the head of the middle turbinate and taken down the lateral portion of  the middle turbinate.  The inferior portion was also taken down leaving  about one half of the turbinate.  The inferior and superior uncinate  process were taken down using the microdebrider and backbiting forceps.  The maxillary ostia was identified and widely opened using the debrider.  Very thick mucus was suctioned out of the sinus cavity.  The ethmoid  bulla on this side was very contracted and more lateralized.  This was I  entered using a straight image guided probe suction tip.  The  microdebrider was also used.  Dissection continued in an anterior to  posterior direction to the level of the ground lamella and likely  posterior as well.  The agger nasi cell was taken down superiorly and  the frontal recess area identified.  It was entered into. Pus was  evacuated immediately.  A culture was obtained.  The sinus was filled  with purulent fluid.  Once this was performed, both of the inferior  turbinates were injected with 1% lidocaine with 1:100,000 of  epinephrine.  The microdebrider was used to debride the inferior one- half of both of the inferior turbinates and the exposed bone taken down  with Tru-cut forceps.   The suction cautery was used for hemostasis,  bilaterally.  Kennedy packs coated with Bactroban cream were placed in  each of the sinus cavities. Bactroban cream coated Telfa packs were  placed in the nasal cavity.  All packs were tied to one another anterior  to the columella. The nasopharynx and oral cavities were suctioned free  of blood and debris.  The table was rotated counterclockwise 90 degrees  to the original table  position.  The patient was extubated in the  operating room and taken to PACU  stable condition.      Lucky Cowboy, MD  Electronically Signed     SJ/MEDQ  D:  04/27/2006  T:  04/28/2006  Job:  [id number]   cc:   Madlyn Frankel Charlann Boxer, M.D.  Armanda Magic, M.D.

## 2010-08-10 ENCOUNTER — Encounter (INDEPENDENT_AMBULATORY_CARE_PROVIDER_SITE_OTHER): Payer: Medicare Other | Admitting: Ophthalmology

## 2010-08-10 DIAGNOSIS — H3530 Unspecified macular degeneration: Secondary | ICD-10-CM

## 2010-08-10 DIAGNOSIS — H43819 Vitreous degeneration, unspecified eye: Secondary | ICD-10-CM

## 2010-08-10 DIAGNOSIS — H2510 Age-related nuclear cataract, unspecified eye: Secondary | ICD-10-CM

## 2010-08-10 DIAGNOSIS — H35039 Hypertensive retinopathy, unspecified eye: Secondary | ICD-10-CM

## 2010-08-25 ENCOUNTER — Encounter (INDEPENDENT_AMBULATORY_CARE_PROVIDER_SITE_OTHER): Payer: Medicare Other | Admitting: Ophthalmology

## 2010-08-25 DIAGNOSIS — H3530 Unspecified macular degeneration: Secondary | ICD-10-CM

## 2010-08-25 DIAGNOSIS — H43819 Vitreous degeneration, unspecified eye: Secondary | ICD-10-CM

## 2010-10-29 LAB — COMPREHENSIVE METABOLIC PANEL
ALT: 13
AST: 25
Albumin: 3.6
Alkaline Phosphatase: 89
CO2: 31
Chloride: 105
GFR calc Af Amer: >60
Potassium: 4.1
Total Bilirubin: 0.8

## 2010-10-29 LAB — CBC
MCHC: 34.7
MCHC: 34.8
MCV: 89.8
MCV: 93.5
Platelets: 163
Platelets: 215
RBC: 2.52 — ABNORMAL LOW
RBC: 3.87
RDW: 15 — ABNORMAL HIGH
WBC: 7.8
WBC: 7

## 2010-10-29 LAB — BASIC METABOLIC PANEL
BUN: 10
BUN: 5 — ABNORMAL LOW
CO2: 26
Calcium: 7.7 — ABNORMAL LOW
Calcium: 8 — ABNORMAL LOW
Chloride: 103
Chloride: 107
Creatinine, Ser: 0.6
Creatinine, Ser: 0.73
GFR calc Af Amer: >60
GFR calc Af Amer: >60
Glucose, Bld: 115 — ABNORMAL HIGH

## 2010-10-29 LAB — TYPE AND SCREEN: ABO/RH(D): O NEG

## 2010-10-29 LAB — URINALYSIS, ROUTINE W REFLEX MICROSCOPIC
Bilirubin Urine: NEGATIVE
Glucose, UA: NEGATIVE
Hgb urine dipstick: NEGATIVE
Nitrite: NEGATIVE
Specific Gravity, Urine: 1.021
pH: 5.5

## 2010-10-29 LAB — PREPARE RBC (CROSSMATCH)

## 2010-12-09 ENCOUNTER — Emergency Department (HOSPITAL_COMMUNITY): Payer: Medicare Other

## 2010-12-09 ENCOUNTER — Encounter (HOSPITAL_COMMUNITY): Payer: Self-pay | Admitting: Emergency Medicine

## 2010-12-09 ENCOUNTER — Emergency Department (HOSPITAL_COMMUNITY)
Admission: EM | Admit: 2010-12-09 | Discharge: 2010-12-10 | Disposition: A | Discharge status: Home / Self Care | Payer: Medicare Other | Attending: Emergency Medicine | Admitting: Emergency Medicine

## 2010-12-09 DIAGNOSIS — S81009A Unspecified open wound, unspecified knee, initial encounter: Secondary | ICD-10-CM | POA: Insufficient documentation

## 2010-12-09 DIAGNOSIS — S91009A Unspecified open wound, unspecified ankle, initial encounter: Secondary | ICD-10-CM | POA: Insufficient documentation

## 2010-12-09 DIAGNOSIS — M79609 Pain in unspecified limb: Secondary | ICD-10-CM | POA: Insufficient documentation

## 2010-12-09 DIAGNOSIS — S8010XA Contusion of unspecified lower leg, initial encounter: Secondary | ICD-10-CM | POA: Insufficient documentation

## 2010-12-09 DIAGNOSIS — Y92009 Unspecified place in unspecified non-institutional (private) residence as the place of occurrence of the external cause: Secondary | ICD-10-CM | POA: Insufficient documentation

## 2010-12-09 DIAGNOSIS — S81812A Laceration without foreign body, left lower leg, initial encounter: Secondary | ICD-10-CM

## 2010-12-09 DIAGNOSIS — Z79899 Other long term (current) drug therapy: Secondary | ICD-10-CM | POA: Insufficient documentation

## 2010-12-09 DIAGNOSIS — IMO0002 Reserved for concepts with insufficient information to code with codable children: Secondary | ICD-10-CM | POA: Insufficient documentation

## 2010-12-09 HISTORY — DX: Essential (primary) hypertension: I10

## 2010-12-09 MED ORDER — TETANUS-DIPHTH-ACELL PERTUSSIS 5-2.5-18.5 LF-MCG/0.5 IM SUSP
0.5000 mL | Freq: Once | INTRAMUSCULAR | Status: AC
  Administered 2010-12-09: 0.5 mL via INTRAMUSCULAR
  Filled 2010-12-09: qty 0.5

## 2010-12-09 MED ORDER — ACETAMINOPHEN 325 MG PO TABS
650.0000 mg | ORAL_TABLET | Freq: Once | ORAL | Status: AC
  Administered 2010-12-09: 650 mg via ORAL
  Filled 2010-12-09: qty 2

## 2010-12-09 NOTE — ED Notes (Signed)
States that she did not hit her head, reports that was was ambulatory after falling.  Pt has 2+ pulses in bilateral feet.  (L) leg is noted to be wrapped-will leave the wrap on until MD arrives.  Skin warm, dry and intact.  Neuro intact.

## 2010-12-09 NOTE — ED Notes (Signed)
Pt reports walking through the yard and stepping in a hole.  States that she

## 2010-12-09 NOTE — ED Provider Notes (Signed)
History     CSN: [id number] Arrival date & time: 12/09/2010  9:10 PM   First MD Initiated Contact with Patient 12/09/10 2143      Chief Complaint  Patient presents with  . Fall    (Consider location/radiation/quality/duration/timing/severity/associated sxs/prior treatment) HPI History provided by pt.   Pt stepped into a deep, cement hole in dark backyard just PTA.  Sustained laceration to left anterior lower leg.  Pain minimal but bleeding uncontrolled.  Pt is not anti-coagulated.  Last tetanus unknown.  Past Medical History  Diagnosis Date  . Hypertension     Past Surgical History  Procedure Date  . Total hip arthroplasty     No family history on file.  History  Substance Use Topics  . Smoking status: Former Games developer  . Smokeless tobacco: Not on file  . Alcohol Use: Yes     OCCASIONAL    OB History    Grav Para Term Preterm Abortions TAB SAB Ect Mult Living                  Review of Systems  All other systems reviewed and are negative.    Allergies  Review of patient's allergies indicates no known allergies.  Home Medications   Current Outpatient Rx  Name Route Sig Dispense Refill  . ATORVASTATIN CALCIUM 20 MG PO TABS Oral Take 20 mg by mouth daily.      [name] DILTIAZEM HCL 180 MG PO CP24 Oral Take 180 mg by mouth daily.      [name] LISINOPRIL-HYDROCHLOROTHIAZIDE 10-12.5 MG PO TABS Oral Take 1 tablet by mouth daily.      [name] METOPROLOL SUCCINATE 25 MG PO TB24 Oral Take 25 mg by mouth daily.        BP 121/65  Pulse 68  Temp(Src) 97.9 F (36.6 C) (Oral)  SpO2 98%  Physical Exam  Nursing note and vitals reviewed. Constitutional: She is oriented to person, place, and time. She appears well-developed and well-nourished. No distress.  HENT:  Head: Normocephalic and atraumatic.  Eyes:       Normal appearance  Neck: Normal range of motion.  Musculoskeletal:       10cm curved laceration, through adipose tissue, over mid-left tibia.   Large overlying hematoma.     Neurological: She is alert and oriented to person, place, and time.  Psychiatric: She has a normal mood and affect. Her behavior is normal.    ED Course  Procedures (including critical care time) LACERATION REPAIR Performed by: Otilio Miu Authorized by: Otilio Miu Consent: Verbal consent obtained. Risks and benefits: risks, benefits and alternatives were discussed Consent given by: patient Patient identity confirmed: provided demographic data Prepped and Draped in normal sterile fashion Wound explored  Laceration Location: left shin  Laceration Length: 10 cm; curved  No Foreign Bodies seen or palpated  Anesthesia: local infiltration  Local anesthetic: 10mL lidocaine 2% w/ epinephrine and 10ml lidocaine w/out epi  Irrigation method: syringe Amount of cleaning: extensive  Skin closure: prolene 3.0  Number of sutures: 17  Technique: simple interrupted; sterile  Patient tolerance: Patient tolerated the procedure well with no immediate complications.  Labs Reviewed - No data to display Dg Tibia/fibula Left  12/09/2010  *RADIOLOGY REPORT*  Clinical Data: Larey Seat.  Laceration and swelling involving the left lower leg.  LEFT TIBIA AND FIBULA - 2 VIEW 12/09/2010:  Comparison: None.  Findings: Lateral image was obtained using cross-table technique. Soft tissue injury overlying the mid tibia anteriorly.  No acute fracture  involving the tibia or fibula.  Mild osteopenia.  Ankle joint intact.  Severe lateral compartment joint space narrowing at the knee with associated hypertrophic spurring.  Medial joint compartment well preserved.  Mild patellofemoral joint space narrowing.  IMPRESSION: No acute osseous abnormality.  Mild osteopenia.  Osteoarthritis involving the knee.  Soft tissue injury.  Original Report Authenticated By: Arnell Sieving, M.D.     1. Laceration of left lower leg       MDM  Pt presents w/ lac to left lower leg.  Tetanus updated.   Wound cleaned and sutured.  Xray neg for fx/foreign body.  Pt receiving IV morphine as well as ancef currently and will be d/c'd home w/ keflex and vicodin.  She will follow up with Dr. Charlann Boxer for recheck.  Signs of infection discussed.          Otilio Miu, [place] 12/10/10 [phone]

## 2010-12-09 NOTE — ED Notes (Signed)
PT. TRIPPED AFTER STEPPING ON A HOLE AT YARD THIS EVENING , NO LOC, PAIN AT LEFT ANTERIOR SHIN / SKIN TEAR AT LEFT SHIN , DRESSING APPLIED AT TRIAGE.

## 2010-12-10 MED ORDER — CEPHALEXIN 500 MG PO CAPS: 500.0000 mg | ORAL_CAPSULE | Freq: Four times a day (QID) | ORAL | Status: DC

## 2010-12-10 MED ORDER — CEPHALEXIN 500 MG PO CAPS: 500.0000 mg | ORAL_CAPSULE | Freq: Three times a day (TID) | ORAL | Status: DC

## 2010-12-10 MED ORDER — HYDROCODONE-ACETAMINOPHEN 5-325 MG PO TABS: 1.0000 | ORAL_TABLET | ORAL | Status: DC | PRN

## 2010-12-10 MED ORDER — HYDROCODONE-ACETAMINOPHEN 5-500 MG PO TABS: 1.0000 | ORAL_TABLET | Freq: Four times a day (QID) | ORAL | Status: AC | PRN

## 2010-12-10 MED ORDER — CEFAZOLIN SODIUM 1-5 GM-% IV SOLN
1.0000 g | Freq: Once | INTRAVENOUS | Status: AC
  Administered 2010-12-10: 1 g via INTRAVENOUS
  Filled 2010-12-10: qty 50

## 2010-12-10 MED ORDER — MORPHINE SULFATE 4 MG/ML IJ SOLN
4.0000 mg | Freq: Once | INTRAMUSCULAR | Status: DC
  Filled 2010-12-10: qty 1

## 2010-12-10 MED ORDER — CEPHALEXIN 500 MG PO CAPS: 500.0000 mg | ORAL_CAPSULE | Freq: Four times a day (QID) | ORAL | Status: AC

## 2010-12-10 NOTE — ED Provider Notes (Addendum)
Medical screening examination/treatment/procedure(s) were conducted as a shared visit with non-physician practitioner(s) and myself.  I personally evaluated the patient during the encounter.  I assisted with the repair of this complex laceration.  There was extensive clotting within the wound which were removed, wound irrigated out copiously and repaired to the best of our ability.  The repair was performed in a double layered method.  Vicryl sutures were used to approximate the subcutaneous tissues, and ethilon sutures to close the skin.  There was no foreign body noted.    Geoffery Lyons, MD 12/10/10 1610  Geoffery Lyons, MD 12/17/10 [phone]

## 2011-01-18 DIAGNOSIS — J189 Pneumonia, unspecified organism: Secondary | ICD-10-CM

## 2011-01-18 HISTORY — DX: Pneumonia, unspecified organism: J18.9

## 2011-02-18 DIAGNOSIS — R0989 Other specified symptoms and signs involving the circulatory and respiratory systems: Secondary | ICD-10-CM | POA: Diagnosis not present

## 2011-02-18 DIAGNOSIS — R5381 Other malaise: Secondary | ICD-10-CM | POA: Diagnosis not present

## 2011-02-18 DIAGNOSIS — F411 Generalized anxiety disorder: Secondary | ICD-10-CM | POA: Diagnosis not present

## 2011-02-18 DIAGNOSIS — I519 Heart disease, unspecified: Secondary | ICD-10-CM | POA: Diagnosis not present

## 2011-02-23 DIAGNOSIS — E785 Hyperlipidemia, unspecified: Secondary | ICD-10-CM | POA: Diagnosis not present

## 2011-02-24 DIAGNOSIS — R0609 Other forms of dyspnea: Secondary | ICD-10-CM | POA: Diagnosis not present

## 2011-03-22 DIAGNOSIS — I4949 Other premature depolarization: Secondary | ICD-10-CM | POA: Diagnosis not present

## 2011-03-22 DIAGNOSIS — I495 Sick sinus syndrome: Secondary | ICD-10-CM | POA: Diagnosis not present

## 2011-03-22 DIAGNOSIS — R002 Palpitations: Secondary | ICD-10-CM | POA: Diagnosis not present

## 2011-03-22 DIAGNOSIS — I10 Essential (primary) hypertension: Secondary | ICD-10-CM | POA: Diagnosis not present

## 2011-03-23 DIAGNOSIS — H2510 Age-related nuclear cataract, unspecified eye: Secondary | ICD-10-CM | POA: Diagnosis not present

## 2011-03-23 DIAGNOSIS — IMO0002 Reserved for concepts with insufficient information to code with codable children: Secondary | ICD-10-CM | POA: Diagnosis not present

## 2011-03-28 DIAGNOSIS — I495 Sick sinus syndrome: Secondary | ICD-10-CM | POA: Diagnosis not present

## 2011-03-28 DIAGNOSIS — I4949 Other premature depolarization: Secondary | ICD-10-CM | POA: Diagnosis not present

## 2011-03-28 DIAGNOSIS — R002 Palpitations: Secondary | ICD-10-CM | POA: Diagnosis not present

## 2011-03-28 DIAGNOSIS — I10 Essential (primary) hypertension: Secondary | ICD-10-CM | POA: Diagnosis not present

## 2011-03-29 ENCOUNTER — Other Ambulatory Visit: Payer: Self-pay | Admitting: Obstetrics and Gynecology

## 2011-03-29 DIAGNOSIS — N63 Unspecified lump in unspecified breast: Secondary | ICD-10-CM

## 2011-04-25 DIAGNOSIS — H26499 Other secondary cataract, unspecified eye: Secondary | ICD-10-CM | POA: Diagnosis not present

## 2011-04-25 DIAGNOSIS — Z961 Presence of intraocular lens: Secondary | ICD-10-CM | POA: Diagnosis not present

## 2011-04-25 DIAGNOSIS — H18419 Arcus senilis, unspecified eye: Secondary | ICD-10-CM | POA: Diagnosis not present

## 2011-04-28 DIAGNOSIS — Z130 Encounter for screening for diseases of the blood and blood-forming organs and certain disorders involving the immune mechanism: Secondary | ICD-10-CM | POA: Diagnosis not present

## 2011-04-28 DIAGNOSIS — D509 Iron deficiency anemia, unspecified: Secondary | ICD-10-CM | POA: Diagnosis not present

## 2011-04-28 DIAGNOSIS — Z124 Encounter for screening for malignant neoplasm of cervix: Secondary | ICD-10-CM | POA: Diagnosis not present

## 2011-04-28 DIAGNOSIS — N390 Urinary tract infection, site not specified: Secondary | ICD-10-CM | POA: Diagnosis not present

## 2011-04-28 DIAGNOSIS — N72 Inflammatory disease of cervix uteri: Secondary | ICD-10-CM | POA: Diagnosis not present

## 2011-05-05 ENCOUNTER — Ambulatory Visit
Admission: RE | Admit: 2011-05-05 | Discharge: 2011-05-05 | Disposition: A | Discharge status: Home / Self Care | Payer: Medicare Other | Source: Ambulatory Visit | Attending: Obstetrics and Gynecology | Admitting: Obstetrics and Gynecology

## 2011-05-05 DIAGNOSIS — N63 Unspecified lump in unspecified breast: Secondary | ICD-10-CM

## 2011-05-05 DIAGNOSIS — R928 Other abnormal and inconclusive findings on diagnostic imaging of breast: Secondary | ICD-10-CM | POA: Diagnosis not present

## 2011-05-06 DIAGNOSIS — I10 Essential (primary) hypertension: Secondary | ICD-10-CM | POA: Diagnosis not present

## 2011-05-06 DIAGNOSIS — I959 Hypotension, unspecified: Secondary | ICD-10-CM | POA: Diagnosis not present

## 2011-05-06 DIAGNOSIS — R002 Palpitations: Secondary | ICD-10-CM | POA: Diagnosis not present

## 2011-05-20 DIAGNOSIS — R002 Palpitations: Secondary | ICD-10-CM | POA: Diagnosis not present

## 2011-05-20 DIAGNOSIS — I10 Essential (primary) hypertension: Secondary | ICD-10-CM | POA: Diagnosis not present

## 2011-06-06 DIAGNOSIS — Z130 Encounter for screening for diseases of the blood and blood-forming organs and certain disorders involving the immune mechanism: Secondary | ICD-10-CM | POA: Diagnosis not present

## 2011-08-26 ENCOUNTER — Encounter (INDEPENDENT_AMBULATORY_CARE_PROVIDER_SITE_OTHER): Payer: Medicare Other | Admitting: Ophthalmology

## 2011-08-26 DIAGNOSIS — H35039 Hypertensive retinopathy, unspecified eye: Secondary | ICD-10-CM

## 2011-08-26 DIAGNOSIS — H43819 Vitreous degeneration, unspecified eye: Secondary | ICD-10-CM | POA: Diagnosis not present

## 2011-08-26 DIAGNOSIS — H2510 Age-related nuclear cataract, unspecified eye: Secondary | ICD-10-CM

## 2011-08-26 DIAGNOSIS — H3530 Unspecified macular degeneration: Secondary | ICD-10-CM

## 2011-08-26 DIAGNOSIS — I10 Essential (primary) hypertension: Secondary | ICD-10-CM

## 2011-09-07 DIAGNOSIS — M1710 Unilateral primary osteoarthritis, unspecified knee: Secondary | ICD-10-CM | POA: Diagnosis not present

## 2011-09-07 DIAGNOSIS — M19019 Primary osteoarthritis, unspecified shoulder: Secondary | ICD-10-CM | POA: Diagnosis not present

## 2011-09-12 DIAGNOSIS — I495 Sick sinus syndrome: Secondary | ICD-10-CM | POA: Diagnosis not present

## 2011-09-12 DIAGNOSIS — I4949 Other premature depolarization: Secondary | ICD-10-CM | POA: Diagnosis not present

## 2011-09-12 DIAGNOSIS — I10 Essential (primary) hypertension: Secondary | ICD-10-CM | POA: Diagnosis not present

## 2011-09-12 DIAGNOSIS — R42 Dizziness and giddiness: Secondary | ICD-10-CM | POA: Diagnosis not present

## 2011-09-13 DIAGNOSIS — M19019 Primary osteoarthritis, unspecified shoulder: Secondary | ICD-10-CM | POA: Diagnosis not present

## 2011-09-29 DIAGNOSIS — R05 Cough: Secondary | ICD-10-CM | POA: Diagnosis not present

## 2011-09-29 DIAGNOSIS — J40 Bronchitis, not specified as acute or chronic: Secondary | ICD-10-CM | POA: Diagnosis not present

## 2011-10-19 DIAGNOSIS — I4949 Other premature depolarization: Secondary | ICD-10-CM | POA: Diagnosis not present

## 2011-10-19 DIAGNOSIS — I10 Essential (primary) hypertension: Secondary | ICD-10-CM | POA: Diagnosis not present

## 2011-10-19 DIAGNOSIS — R002 Palpitations: Secondary | ICD-10-CM | POA: Diagnosis not present

## 2011-11-08 DIAGNOSIS — Z23 Encounter for immunization: Secondary | ICD-10-CM | POA: Diagnosis not present

## 2011-11-17 DIAGNOSIS — J320 Chronic maxillary sinusitis: Secondary | ICD-10-CM | POA: Diagnosis not present

## 2011-11-17 DIAGNOSIS — J321 Chronic frontal sinusitis: Secondary | ICD-10-CM | POA: Diagnosis not present

## 2012-02-08 DIAGNOSIS — E785 Hyperlipidemia, unspecified: Secondary | ICD-10-CM | POA: Diagnosis not present

## 2012-02-08 DIAGNOSIS — M949 Disorder of cartilage, unspecified: Secondary | ICD-10-CM | POA: Diagnosis not present

## 2012-02-08 DIAGNOSIS — I10 Essential (primary) hypertension: Secondary | ICD-10-CM | POA: Diagnosis not present

## 2012-02-08 DIAGNOSIS — M899 Disorder of bone, unspecified: Secondary | ICD-10-CM | POA: Diagnosis not present

## 2012-02-08 DIAGNOSIS — R82998 Other abnormal findings in urine: Secondary | ICD-10-CM | POA: Diagnosis not present

## 2012-02-15 DIAGNOSIS — Z1331 Encounter for screening for depression: Secondary | ICD-10-CM | POA: Diagnosis not present

## 2012-02-15 DIAGNOSIS — Z0000 Encounter for general adult medical examination without abnormal findings: Secondary | ICD-10-CM | POA: Diagnosis not present

## 2012-02-15 DIAGNOSIS — I519 Heart disease, unspecified: Secondary | ICD-10-CM | POA: Diagnosis not present

## 2012-02-15 DIAGNOSIS — Z79899 Other long term (current) drug therapy: Secondary | ICD-10-CM | POA: Diagnosis not present

## 2012-02-16 DIAGNOSIS — Z1212 Encounter for screening for malignant neoplasm of rectum: Secondary | ICD-10-CM | POA: Diagnosis not present

## 2012-04-02 DIAGNOSIS — I10 Essential (primary) hypertension: Secondary | ICD-10-CM | POA: Diagnosis not present

## 2012-04-02 DIAGNOSIS — R002 Palpitations: Secondary | ICD-10-CM | POA: Diagnosis not present

## 2012-04-04 ENCOUNTER — Other Ambulatory Visit: Payer: Self-pay | Admitting: Obstetrics and Gynecology

## 2012-04-04 DIAGNOSIS — N6039 Fibrosclerosis of unspecified breast: Secondary | ICD-10-CM

## 2012-05-07 ENCOUNTER — Ambulatory Visit
Admission: RE | Admit: 2012-05-07 | Discharge: 2012-05-07 | Disposition: A | Discharge status: Home / Self Care | Payer: Medicare Other | Source: Ambulatory Visit | Attending: Obstetrics and Gynecology | Admitting: Obstetrics and Gynecology

## 2012-05-07 DIAGNOSIS — N6039 Fibrosclerosis of unspecified breast: Secondary | ICD-10-CM

## 2012-05-07 DIAGNOSIS — Z853 Personal history of malignant neoplasm of breast: Secondary | ICD-10-CM | POA: Diagnosis not present

## 2012-05-09 DIAGNOSIS — J189 Pneumonia, unspecified organism: Secondary | ICD-10-CM | POA: Diagnosis not present

## 2012-05-09 DIAGNOSIS — R0989 Other specified symptoms and signs involving the circulatory and respiratory systems: Secondary | ICD-10-CM | POA: Diagnosis not present

## 2012-05-17 DIAGNOSIS — R319 Hematuria, unspecified: Secondary | ICD-10-CM | POA: Diagnosis not present

## 2012-05-17 DIAGNOSIS — Z124 Encounter for screening for malignant neoplasm of cervix: Secondary | ICD-10-CM | POA: Diagnosis not present

## 2012-05-25 DIAGNOSIS — J45909 Unspecified asthma, uncomplicated: Secondary | ICD-10-CM | POA: Diagnosis not present

## 2012-05-25 DIAGNOSIS — I10 Essential (primary) hypertension: Secondary | ICD-10-CM | POA: Diagnosis not present

## 2012-05-25 DIAGNOSIS — IMO0002 Reserved for concepts with insufficient information to code with codable children: Secondary | ICD-10-CM | POA: Diagnosis not present

## 2012-05-25 DIAGNOSIS — J189 Pneumonia, unspecified organism: Secondary | ICD-10-CM | POA: Diagnosis not present

## 2012-05-25 DIAGNOSIS — J449 Chronic obstructive pulmonary disease, unspecified: Secondary | ICD-10-CM | POA: Diagnosis not present

## 2012-05-25 DIAGNOSIS — Z23 Encounter for immunization: Secondary | ICD-10-CM | POA: Diagnosis not present

## 2012-05-29 DIAGNOSIS — J449 Chronic obstructive pulmonary disease, unspecified: Secondary | ICD-10-CM | POA: Diagnosis not present

## 2012-05-29 DIAGNOSIS — M79609 Pain in unspecified limb: Secondary | ICD-10-CM | POA: Diagnosis not present

## 2012-05-29 DIAGNOSIS — J8410 Pulmonary fibrosis, unspecified: Secondary | ICD-10-CM | POA: Diagnosis not present

## 2012-05-29 DIAGNOSIS — L03039 Cellulitis of unspecified toe: Secondary | ICD-10-CM | POA: Diagnosis not present

## 2012-06-06 ENCOUNTER — Other Ambulatory Visit: Payer: Self-pay | Admitting: Internal Medicine

## 2012-06-06 DIAGNOSIS — N63 Unspecified lump in unspecified breast: Secondary | ICD-10-CM

## 2012-06-14 ENCOUNTER — Other Ambulatory Visit: Payer: Medicare Other

## 2012-06-18 DIAGNOSIS — I4949 Other premature depolarization: Secondary | ICD-10-CM | POA: Diagnosis not present

## 2012-06-18 DIAGNOSIS — I495 Sick sinus syndrome: Secondary | ICD-10-CM | POA: Diagnosis not present

## 2012-06-18 DIAGNOSIS — I10 Essential (primary) hypertension: Secondary | ICD-10-CM | POA: Diagnosis not present

## 2012-06-19 ENCOUNTER — Ambulatory Visit
Admission: RE | Admit: 2012-06-19 | Discharge: 2012-06-19 | Disposition: A | Discharge status: Home / Self Care | Payer: Medicare Other | Source: Ambulatory Visit | Attending: Internal Medicine | Admitting: Internal Medicine

## 2012-06-19 DIAGNOSIS — N63 Unspecified lump in unspecified breast: Secondary | ICD-10-CM

## 2012-06-19 DIAGNOSIS — N390 Urinary tract infection, site not specified: Secondary | ICD-10-CM | POA: Diagnosis not present

## 2012-06-19 DIAGNOSIS — R319 Hematuria, unspecified: Secondary | ICD-10-CM | POA: Diagnosis not present

## 2012-06-19 DIAGNOSIS — Z853 Personal history of malignant neoplasm of breast: Secondary | ICD-10-CM | POA: Diagnosis not present

## 2012-06-19 DIAGNOSIS — R922 Inconclusive mammogram: Secondary | ICD-10-CM | POA: Diagnosis not present

## 2012-06-26 DIAGNOSIS — N760 Acute vaginitis: Secondary | ICD-10-CM | POA: Diagnosis not present

## 2012-06-26 DIAGNOSIS — R350 Frequency of micturition: Secondary | ICD-10-CM | POA: Diagnosis not present

## 2012-06-26 DIAGNOSIS — N898 Other specified noninflammatory disorders of vagina: Secondary | ICD-10-CM | POA: Diagnosis not present

## 2012-06-26 DIAGNOSIS — R3915 Urgency of urination: Secondary | ICD-10-CM | POA: Diagnosis not present

## 2012-08-15 DIAGNOSIS — F411 Generalized anxiety disorder: Secondary | ICD-10-CM | POA: Diagnosis not present

## 2012-08-15 DIAGNOSIS — J449 Chronic obstructive pulmonary disease, unspecified: Secondary | ICD-10-CM | POA: Diagnosis not present

## 2012-08-15 DIAGNOSIS — IMO0002 Reserved for concepts with insufficient information to code with codable children: Secondary | ICD-10-CM | POA: Diagnosis not present

## 2012-08-15 DIAGNOSIS — I10 Essential (primary) hypertension: Secondary | ICD-10-CM | POA: Diagnosis not present

## 2012-08-27 ENCOUNTER — Ambulatory Visit (INDEPENDENT_AMBULATORY_CARE_PROVIDER_SITE_OTHER): Payer: Medicare Other | Admitting: Ophthalmology

## 2012-08-27 DIAGNOSIS — H35039 Hypertensive retinopathy, unspecified eye: Secondary | ICD-10-CM

## 2012-08-27 DIAGNOSIS — I10 Essential (primary) hypertension: Secondary | ICD-10-CM | POA: Diagnosis not present

## 2012-08-27 DIAGNOSIS — H3530 Unspecified macular degeneration: Secondary | ICD-10-CM | POA: Diagnosis not present

## 2012-08-27 DIAGNOSIS — H43819 Vitreous degeneration, unspecified eye: Secondary | ICD-10-CM | POA: Diagnosis not present

## 2012-10-08 DIAGNOSIS — J984 Other disorders of lung: Secondary | ICD-10-CM | POA: Diagnosis not present

## 2012-10-08 DIAGNOSIS — J449 Chronic obstructive pulmonary disease, unspecified: Secondary | ICD-10-CM | POA: Diagnosis not present

## 2012-10-19 DIAGNOSIS — M1710 Unilateral primary osteoarthritis, unspecified knee: Secondary | ICD-10-CM | POA: Diagnosis not present

## 2012-10-19 DIAGNOSIS — M25559 Pain in unspecified hip: Secondary | ICD-10-CM | POA: Diagnosis not present

## 2012-10-26 DIAGNOSIS — Z23 Encounter for immunization: Secondary | ICD-10-CM | POA: Diagnosis not present

## 2012-11-09 DIAGNOSIS — M1710 Unilateral primary osteoarthritis, unspecified knee: Secondary | ICD-10-CM | POA: Diagnosis not present

## 2012-12-08 ENCOUNTER — Encounter: Payer: Self-pay | Admitting: Cardiology

## 2012-12-08 ENCOUNTER — Encounter: Payer: Self-pay | Admitting: *Deleted

## 2012-12-17 ENCOUNTER — Encounter: Payer: Self-pay | Admitting: Cardiology

## 2012-12-17 ENCOUNTER — Ambulatory Visit (INDEPENDENT_AMBULATORY_CARE_PROVIDER_SITE_OTHER): Payer: Medicare Other | Admitting: Cardiology

## 2012-12-17 VITALS — BP 114/78 | HR 55 | Ht 65.0 in | Wt 137.0 lb

## 2012-12-17 DIAGNOSIS — I4949 Other premature depolarization: Secondary | ICD-10-CM

## 2012-12-17 DIAGNOSIS — I10 Essential (primary) hypertension: Secondary | ICD-10-CM | POA: Diagnosis not present

## 2012-12-17 DIAGNOSIS — I493 Ventricular premature depolarization: Secondary | ICD-10-CM

## 2012-12-17 DIAGNOSIS — I498 Other specified cardiac arrhythmias: Secondary | ICD-10-CM | POA: Diagnosis not present

## 2012-12-17 DIAGNOSIS — R001 Bradycardia, unspecified: Secondary | ICD-10-CM | POA: Insufficient documentation

## 2012-12-17 NOTE — Progress Notes (Signed)
  [address] [place], [place]  [address] Phone: [phone] Fax:  [phone]  Date:  12/17/2012   ID:  [name] [name], DOB [birth year], MRN [id number]  PCP:  Ezequiel Kayser, MD  Cardiologist: Armanda Magic, MD     History of Present Illness: [name] [name] is a 79 y.o. female with a history of PVC's and HTN.  She is doing great.  She went to [place] on a trip this summer and did well.  She denies any palpitations, chest pain, SOB, DOE, LE edema, dizziness or syncope.   Wt Readings from Last 3 Encounters:  12/17/12 137 lb (62.143 kg)  09/01/09 144 lb (65.318 kg)     Past Medical History  Diagnosis Date  . DEGENERATIVE JOINT DISEASE, KNEE   . GENU VALGUM   . 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 ROTATOR CUFF SYNDROME, LEFT   . UNEQUAL LEG LENGTH   . Bradycardia     asymptomatic  . PVC's (premature ventricular contractions)   . Hypertension   . Hyperlipidemia   . Breast CA     Current Outpatient Prescriptions  Medication Sig Dispense Refill  . aspirin 81 MG tablet Take 81 mg by mouth daily.      [name] atorvastatin (LIPITOR) 20 MG tablet Take 20 mg by mouth daily.        . Cyanocobalamin (B-12) 1000 MCG CAPS Take by mouth daily.      [name] escitalopram (LEXAPRO) 5 MG tablet Take 5 mg by mouth daily.      . Ibandronate Sodium (BONIVA PO) Take 100 mg by mouth. once a month      . IRON, FERROUS GLUCONATE, PO Take by mouth daily.      [name] lisinopril (PRINIVIL,ZESTRIL) 20 MG tablet Take 20 mg by mouth daily.      [name] LORazepam (ATIVAN) 0.5 MG tablet Take 0.5 mg by mouth every 4 (four) hours as needed for anxiety.      . metoprolol (LOPRESSOR) 100 MG tablet Take 100 mg by mouth 2 (two) times daily.       No current facility-administered medications for this visit.    Allergies:    Allergies  Allergen Reactions  . Cardizem [Diltiazem Hcl]     hypotension    Social History:  The patient  reports that she has quit smoking. She does not have any smokeless  tobacco history on file. She reports that she drinks alcohol.   Family History:  The patient's family history is not on file.   ROS:  Please see the history of present illness.      All other systems reviewed and negative.   PHYSICAL EXAM: VS:  BP 114/78  Pulse 55  Ht 5\' 5"  (1.651 m)  Wt 137 lb (62.143 kg)  BMI 22.80 kg/m2 Well nourished, well developed, in no acute distress HEENT: normal Neck: no JVD Cardiac:  normal S1, S2; RRR; no murmur Lungs:  clear to auscultation bilaterally, no wheezing, rhonchi or rales Abd: soft, nontender, no hepatomegaly Ext: no edema Skin: warm and dry Neuro:  CNs 2-12 intact, no focal abnormalities noted  EKG:  Sinus bradycardia     ASSESSMENT AND PLAN:  1. HTN - well controlled  - continue Lisinopril and metoprolol 2. PVC's - continue beta blocker 3.  Asymptomatic bradycardia  Followup with me in 6 months  Signed, Armanda Magic, MD 12/17/2012 3:18 PM

## 2012-12-17 NOTE — Patient Instructions (Signed)
Your physician recommends that you continue on your current medications as directed. Please refer to the Current Medication list given to you today.  Your physician wants you to follow-up in: 6 Months with Dr Turner You will receive a reminder letter in the mail two months in advance. If you don't receive a letter, please call our office to schedule the follow-up appointment.  

## 2013-01-04 DIAGNOSIS — Z79899 Other long term (current) drug therapy: Secondary | ICD-10-CM | POA: Diagnosis not present

## 2013-01-04 DIAGNOSIS — I10 Essential (primary) hypertension: Secondary | ICD-10-CM | POA: Diagnosis not present

## 2013-01-31 DIAGNOSIS — M1710 Unilateral primary osteoarthritis, unspecified knee: Secondary | ICD-10-CM | POA: Diagnosis not present

## 2013-01-31 DIAGNOSIS — IMO0002 Reserved for concepts with insufficient information to code with codable children: Secondary | ICD-10-CM | POA: Diagnosis not present

## 2013-02-22 DIAGNOSIS — I10 Essential (primary) hypertension: Secondary | ICD-10-CM | POA: Diagnosis not present

## 2013-02-22 DIAGNOSIS — R82998 Other abnormal findings in urine: Secondary | ICD-10-CM | POA: Diagnosis not present

## 2013-02-22 DIAGNOSIS — M899 Disorder of bone, unspecified: Secondary | ICD-10-CM | POA: Diagnosis not present

## 2013-02-22 DIAGNOSIS — M949 Disorder of cartilage, unspecified: Secondary | ICD-10-CM | POA: Diagnosis not present

## 2013-02-22 DIAGNOSIS — E785 Hyperlipidemia, unspecified: Secondary | ICD-10-CM | POA: Diagnosis not present

## 2013-02-27 DIAGNOSIS — IMO0002 Reserved for concepts with insufficient information to code with codable children: Secondary | ICD-10-CM | POA: Diagnosis not present

## 2013-02-27 DIAGNOSIS — M1710 Unilateral primary osteoarthritis, unspecified knee: Secondary | ICD-10-CM | POA: Diagnosis not present

## 2013-02-27 DIAGNOSIS — M25569 Pain in unspecified knee: Secondary | ICD-10-CM | POA: Diagnosis not present

## 2013-03-01 DIAGNOSIS — F411 Generalized anxiety disorder: Secondary | ICD-10-CM | POA: Diagnosis not present

## 2013-03-01 DIAGNOSIS — J309 Allergic rhinitis, unspecified: Secondary | ICD-10-CM | POA: Diagnosis not present

## 2013-03-01 DIAGNOSIS — Z0000 Encounter for general adult medical examination without abnormal findings: Secondary | ICD-10-CM | POA: Diagnosis not present

## 2013-03-01 DIAGNOSIS — J449 Chronic obstructive pulmonary disease, unspecified: Secondary | ICD-10-CM | POA: Diagnosis not present

## 2013-03-01 DIAGNOSIS — J45909 Unspecified asthma, uncomplicated: Secondary | ICD-10-CM | POA: Diagnosis not present

## 2013-03-01 DIAGNOSIS — I519 Heart disease, unspecified: Secondary | ICD-10-CM | POA: Diagnosis not present

## 2013-03-01 DIAGNOSIS — E785 Hyperlipidemia, unspecified: Secondary | ICD-10-CM | POA: Diagnosis not present

## 2013-03-01 DIAGNOSIS — Z1331 Encounter for screening for depression: Secondary | ICD-10-CM | POA: Diagnosis not present

## 2013-03-01 DIAGNOSIS — IMO0002 Reserved for concepts with insufficient information to code with codable children: Secondary | ICD-10-CM | POA: Diagnosis not present

## 2013-03-06 DIAGNOSIS — M899 Disorder of bone, unspecified: Secondary | ICD-10-CM | POA: Diagnosis not present

## 2013-03-13 DIAGNOSIS — M19019 Primary osteoarthritis, unspecified shoulder: Secondary | ICD-10-CM | POA: Diagnosis not present

## 2013-03-27 ENCOUNTER — Other Ambulatory Visit: Payer: Self-pay

## 2013-03-27 DIAGNOSIS — Z1231 Encounter for screening mammogram for malignant neoplasm of breast: Secondary | ICD-10-CM

## 2013-04-18 DIAGNOSIS — K08109 Complete loss of teeth, unspecified cause, unspecified class: Secondary | ICD-10-CM | POA: Diagnosis not present

## 2013-05-09 ENCOUNTER — Ambulatory Visit: Payer: Medicare Other

## 2013-05-16 ENCOUNTER — Ambulatory Visit: Payer: Medicare Other

## 2013-06-05 DIAGNOSIS — M25569 Pain in unspecified knee: Secondary | ICD-10-CM | POA: Diagnosis not present

## 2013-06-05 DIAGNOSIS — M129 Arthropathy, unspecified: Secondary | ICD-10-CM | POA: Diagnosis not present

## 2013-06-11 ENCOUNTER — Ambulatory Visit: Payer: Medicare Other

## 2013-06-11 ENCOUNTER — Ambulatory Visit
Admission: RE | Admit: 2013-06-11 | Discharge: 2013-06-11 | Disposition: A | Discharge status: Home / Self Care | Payer: Medicare Other | Source: Ambulatory Visit

## 2013-06-11 DIAGNOSIS — Z1231 Encounter for screening mammogram for malignant neoplasm of breast: Secondary | ICD-10-CM

## 2013-06-12 DIAGNOSIS — Z01419 Encounter for gynecological examination (general) (routine) without abnormal findings: Secondary | ICD-10-CM | POA: Diagnosis not present

## 2013-06-24 ENCOUNTER — Telehealth: Payer: Self-pay | Admitting: *Deleted

## 2013-06-24 NOTE — Telephone Encounter (Signed)
Patient requests metoprolol refill, but she stated that she takes the metoprolol xl 100mg  qd. It this correct? This is not what is reflected on the chart. Please advise. Thanks, MI

## 2013-06-24 NOTE — Telephone Encounter (Signed)
yes

## 2013-06-24 NOTE — Telephone Encounter (Signed)
Dr. Radford Pax, [facility] records show Toprol Xl 100 mg qd, but list here shows Lopressor 100 mg BID. Can pt continue Toprol Xl 100 mg qd?

## 2013-06-25 ENCOUNTER — Other Ambulatory Visit: Payer: Self-pay | Admitting: *Deleted

## 2013-06-25 MED ORDER — METOPROLOL SUCCINATE ER 100 MG PO TB24: 100.0000 mg | ORAL_TABLET | Freq: Every day | ORAL | Status: DC

## 2013-06-25 NOTE — Telephone Encounter (Signed)
To Miny, please fill.

## 2013-07-03 ENCOUNTER — Telehealth: Payer: Self-pay | Admitting: Cardiology

## 2013-07-08 ENCOUNTER — Ambulatory Visit (INDEPENDENT_AMBULATORY_CARE_PROVIDER_SITE_OTHER): Payer: Medicare Other | Admitting: General Surgery

## 2013-07-08 ENCOUNTER — Telehealth: Payer: Self-pay | Admitting: Cardiology

## 2013-07-08 DIAGNOSIS — I493 Ventricular premature depolarization: Secondary | ICD-10-CM

## 2013-07-08 DIAGNOSIS — I4949 Other premature depolarization: Secondary | ICD-10-CM

## 2013-07-08 NOTE — Telephone Encounter (Signed)
yes

## 2013-07-08 NOTE — Telephone Encounter (Signed)
TO Dr Radford Pax did you need her to do EKG?

## 2013-07-08 NOTE — Progress Notes (Signed)
Pt came in today to have a EKG per Dr Radford Pax due to a Episode yesterday of PVCs as pt stated. She stated that today she only took 50 Mg of her toprol vs her 100 MG like she is prescribed. I stated until she hears otherwise from Dr Radford Pax I would like her to take her full dosage as prescribed. She said once she goes home she will take the other half of the pill. BP  Was 121/68 Pulse 52  Pt was oked to go by DOD Dr. Marlou Porch who looked at EKG for me.   TO Dr Radford Pax to make aware.

## 2013-07-08 NOTE — Telephone Encounter (Signed)
TO TT to make aware.

## 2013-07-08 NOTE — Telephone Encounter (Signed)
New message     Pt states that she talked to Dr Radford Pax sunday and she want her to come in today for an EKG because she is having PVC's.  Pt want to come in this afternoon. Please call

## 2013-07-08 NOTE — Telephone Encounter (Signed)
Nurse visits only done in AM. I will have pt come in at 3 so I will be available to do EKG for Dr Radford Pax.   Sending to Dr Radford Pax to make aware. I will ask Dr Marlou Porch to look at EKG before I let pt go and then will keep it on your cart to review in office  Tomorrow.

## 2013-07-09 ENCOUNTER — Other Ambulatory Visit: Payer: Self-pay | Admitting: General Surgery

## 2013-07-09 DIAGNOSIS — I493 Ventricular premature depolarization: Secondary | ICD-10-CM

## 2013-07-09 DIAGNOSIS — R001 Bradycardia, unspecified: Secondary | ICD-10-CM

## 2013-07-09 MED ORDER — METOPROLOL SUCCINATE ER 50 MG PO TB24: 50.0000 mg | ORAL_TABLET | Freq: Every day | ORAL | Status: DC

## 2013-07-09 NOTE — Progress Notes (Signed)
Pt is aware, Med list updated and monitor ordered for pt.

## 2013-07-09 NOTE — Progress Notes (Signed)
To Dr Radford Pax to make aware.

## 2013-07-09 NOTE — Progress Notes (Signed)
Please have her keep Toprol at 50mg  daily instead of 100mg  due to bradycardia and get a 24 hour Holter monitor to assess average HR on Toprol 50mg  daily and assess PVC load

## 2013-07-15 ENCOUNTER — Encounter (INDEPENDENT_AMBULATORY_CARE_PROVIDER_SITE_OTHER): Payer: Medicare Other

## 2013-07-15 ENCOUNTER — Encounter: Payer: Self-pay | Admitting: *Deleted

## 2013-07-15 DIAGNOSIS — I498 Other specified cardiac arrhythmias: Secondary | ICD-10-CM

## 2013-07-15 DIAGNOSIS — I4949 Other premature depolarization: Secondary | ICD-10-CM

## 2013-07-15 DIAGNOSIS — I493 Ventricular premature depolarization: Secondary | ICD-10-CM

## 2013-07-15 DIAGNOSIS — R001 Bradycardia, unspecified: Secondary | ICD-10-CM

## 2013-07-15 NOTE — Progress Notes (Unsigned)
Patient ID: [name] [name], female   DOB: November 21, 1930, 80 y.o.   MRN: [id number] E-cardio 24 hour holter monitor applied to patient.

## 2013-07-18 ENCOUNTER — Telehealth: Payer: Self-pay | Admitting: Cardiology

## 2013-07-18 NOTE — Telephone Encounter (Signed)
Received request from Nurse fax box, documents faxed for surgical clearance. To: Cydney Ok Fax number: [phone] Attention: 7.2.15/km

## 2013-07-24 ENCOUNTER — Telehealth: Payer: Self-pay | Admitting: Cardiology

## 2013-07-24 NOTE — Telephone Encounter (Signed)
Please let patient know that heart monitor showed NSR with rare PVC's.  PVC load 2%. Her complaints of palpitations correspond with her PVC's.  Her average HR is 67bpm.  Unless her palpitations are real bothersome, I recommend continuing current therapy

## 2013-07-25 NOTE — Telephone Encounter (Signed)
lmtrc

## 2013-07-29 ENCOUNTER — Telehealth: Payer: Self-pay | Admitting: Cardiology

## 2013-07-29 DIAGNOSIS — S59909A Unspecified injury of unspecified elbow, initial encounter: Secondary | ICD-10-CM | POA: Diagnosis not present

## 2013-07-29 DIAGNOSIS — S022XXA Fracture of nasal bones, initial encounter for closed fracture: Secondary | ICD-10-CM | POA: Diagnosis not present

## 2013-07-29 DIAGNOSIS — S99919A Unspecified injury of unspecified ankle, initial encounter: Secondary | ICD-10-CM | POA: Diagnosis not present

## 2013-07-29 DIAGNOSIS — S298XXA Other specified injuries of thorax, initial encounter: Secondary | ICD-10-CM | POA: Diagnosis not present

## 2013-07-29 DIAGNOSIS — W19XXXA Unspecified fall, initial encounter: Secondary | ICD-10-CM | POA: Diagnosis not present

## 2013-07-29 DIAGNOSIS — T07XXXA Unspecified multiple injuries, initial encounter: Secondary | ICD-10-CM | POA: Diagnosis not present

## 2013-07-29 DIAGNOSIS — S1093XA Contusion of unspecified part of neck, initial encounter: Secondary | ICD-10-CM | POA: Diagnosis not present

## 2013-07-29 DIAGNOSIS — S0003XA Contusion of scalp, initial encounter: Secondary | ICD-10-CM | POA: Diagnosis not present

## 2013-07-29 DIAGNOSIS — M79609 Pain in unspecified limb: Secondary | ICD-10-CM | POA: Diagnosis not present

## 2013-07-29 DIAGNOSIS — S0990XA Unspecified injury of head, initial encounter: Secondary | ICD-10-CM | POA: Diagnosis not present

## 2013-07-29 DIAGNOSIS — S41009A Unspecified open wound of unspecified shoulder, initial encounter: Secondary | ICD-10-CM | POA: Diagnosis not present

## 2013-07-29 DIAGNOSIS — I10 Essential (primary) hypertension: Secondary | ICD-10-CM | POA: Diagnosis not present

## 2013-07-29 DIAGNOSIS — M542 Cervicalgia: Secondary | ICD-10-CM | POA: Diagnosis not present

## 2013-07-29 DIAGNOSIS — IMO0002 Reserved for concepts with insufficient information to code with codable children: Secondary | ICD-10-CM | POA: Diagnosis not present

## 2013-07-29 DIAGNOSIS — Z23 Encounter for immunization: Secondary | ICD-10-CM | POA: Diagnosis not present

## 2013-07-29 DIAGNOSIS — S51809A Unspecified open wound of unspecified forearm, initial encounter: Secondary | ICD-10-CM | POA: Diagnosis not present

## 2013-07-29 DIAGNOSIS — J449 Chronic obstructive pulmonary disease, unspecified: Secondary | ICD-10-CM | POA: Diagnosis not present

## 2013-07-29 DIAGNOSIS — W108XXA Fall (on) (from) other stairs and steps, initial encounter: Secondary | ICD-10-CM | POA: Diagnosis not present

## 2013-07-29 DIAGNOSIS — S6990XA Unspecified injury of unspecified wrist, hand and finger(s), initial encounter: Secondary | ICD-10-CM | POA: Diagnosis not present

## 2013-07-29 NOTE — Telephone Encounter (Signed)
The patient is aware of Dr. Theodosia Blender impression on her monitor.

## 2013-07-29 NOTE — Telephone Encounter (Signed)
New message     Want monitor results 

## 2013-07-29 NOTE — Telephone Encounter (Signed)
I left a message for the patient to call. 

## 2013-07-29 NOTE — Telephone Encounter (Signed)
Attempted to call the patient. Went to Designer, television/film set. I will call back.

## 2013-07-29 NOTE — Telephone Encounter (Signed)
Follow up     Returned Heather's call

## 2013-07-30 NOTE — Telephone Encounter (Signed)
LMTRC

## 2013-07-31 ENCOUNTER — Encounter: Payer: Self-pay | Admitting: General Surgery

## 2013-07-31 NOTE — Telephone Encounter (Signed)
Letter sent to pt to make aware.

## 2013-08-05 DIAGNOSIS — S51809A Unspecified open wound of unspecified forearm, initial encounter: Secondary | ICD-10-CM | POA: Diagnosis not present

## 2013-08-05 DIAGNOSIS — M1710 Unilateral primary osteoarthritis, unspecified knee: Secondary | ICD-10-CM | POA: Diagnosis not present

## 2013-08-07 DIAGNOSIS — S51809A Unspecified open wound of unspecified forearm, initial encounter: Secondary | ICD-10-CM | POA: Diagnosis not present

## 2013-08-09 DIAGNOSIS — S51809A Unspecified open wound of unspecified forearm, initial encounter: Secondary | ICD-10-CM | POA: Diagnosis not present

## 2013-08-27 ENCOUNTER — Ambulatory Visit (INDEPENDENT_AMBULATORY_CARE_PROVIDER_SITE_OTHER): Payer: Medicare Other | Admitting: Ophthalmology

## 2013-08-27 DIAGNOSIS — I10 Essential (primary) hypertension: Secondary | ICD-10-CM | POA: Diagnosis not present

## 2013-08-27 DIAGNOSIS — H35039 Hypertensive retinopathy, unspecified eye: Secondary | ICD-10-CM | POA: Diagnosis not present

## 2013-08-27 DIAGNOSIS — H3530 Unspecified macular degeneration: Secondary | ICD-10-CM

## 2013-08-27 DIAGNOSIS — H35379 Puckering of macula, unspecified eye: Secondary | ICD-10-CM | POA: Diagnosis not present

## 2013-08-27 DIAGNOSIS — H43819 Vitreous degeneration, unspecified eye: Secondary | ICD-10-CM

## 2013-08-28 NOTE — Progress Notes (Signed)
  [place], [address], [place]  [address] Phone: [phone] Fax:  [phone]  Date:  08/29/2013   ID:  [name] [name], DOB 07-May-1930, MRN [id number]  PCP:  Jerlyn Ly, MD  Cardiologist:  Fransico Him, MD     History of Present Illness: [name] [name] is a 80 y.o. female with a history of PVC's and HTN.  She denies any palpitations, chest pain, SOB, DOE, LE edema, dizziness or syncope.    Wt Readings from Last 3 Encounters:  08/29/13 139 lb 6.4 oz (63.231 kg)  12/17/12 137 lb (62.143 kg)  09/01/09 144 lb (65.318 kg)     Past Medical History  Diagnosis Date  . DEGENERATIVE JOINT DISEASE, KNEE   . GENU VALGUM   . 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 ROTATOR CUFF SYNDROME, LEFT   . UNEQUAL LEG LENGTH   . Bradycardia     asymptomatic  . PVC's (premature ventricular contractions)   . Hypertension   . Hyperlipidemia   . Breast CA     Current Outpatient Prescriptions  Medication Sig Dispense Refill  . aspirin 81 MG tablet Take 81 mg by mouth daily.      [name] atorvastatin (LIPITOR) 20 MG tablet Take 20 mg by mouth daily.        . Cyanocobalamin (B-12) 1000 MCG CAPS Take by mouth daily.      [name] escitalopram (LEXAPRO) 5 MG tablet Take 10 mg by mouth daily.       . Ibandronate Sodium (BONIVA PO) Take 150 mg by mouth. once a month      . IRON, FERROUS GLUCONATE, PO Take by mouth daily.      [name] lisinopril (PRINIVIL,ZESTRIL) 20 MG tablet Take 20 mg by mouth daily.      [name] LORazepam (ATIVAN) 0.5 MG tablet Take 0.5 mg by mouth every 4 (four) hours as needed for anxiety.      . metoprolol succinate (TOPROL-XL) 50 MG 24 hr tablet Take 1 tablet (50 mg total) by mouth daily. Take with or immediately following a meal.  90 tablet  0   No current facility-administered medications for this visit.    Allergies:    Allergies  Allergen Reactions  . Cardizem [Diltiazem Hcl]     hypotension    Social History:  The patient  reports that she has quit smoking.  She does not have any smokeless tobacco history on file. She reports that she drinks alcohol.   Family History:  The patient's family history is not on file.   ROS:  Please see the history of present illness.      All other systems reviewed and negative.   PHYSICAL EXAM: VS:  BP 120/78  Pulse 60  Wt 139 lb 6.4 oz (63.231 kg) Well nourished, well developed, in no acute distress HEENT: normal Neck: no JVD Cardiac:  normal S1, S2; RRR; no murmur Lungs:  clear to auscultation bilaterally, no wheezing, rhonchi or rales Abd: soft, nontender, no hepatomegaly Ext: no edema Skin: warm and dry Neuro:  CNs 2-12 intact, no focal abnormalities noted  ASSESSMENT AND PLAN:  1. HTN - well controlled - continue Lisinopril and metoprolol  2. PVC's - continue beta blocker       3. Asymptomatic bradycardia   Followup with me in 6 months    Signed, Fransico Him, MD 08/29/2013 4:27 PM

## 2013-08-29 ENCOUNTER — Ambulatory Visit (INDEPENDENT_AMBULATORY_CARE_PROVIDER_SITE_OTHER): Payer: Medicare Other | Admitting: Cardiology

## 2013-08-29 ENCOUNTER — Encounter: Payer: Self-pay | Admitting: Cardiology

## 2013-08-29 VITALS — BP 120/78 | HR 60 | Wt 139.4 lb

## 2013-08-29 DIAGNOSIS — I4949 Other premature depolarization: Secondary | ICD-10-CM

## 2013-08-29 DIAGNOSIS — I10 Essential (primary) hypertension: Secondary | ICD-10-CM

## 2013-08-29 DIAGNOSIS — I498 Other specified cardiac arrhythmias: Secondary | ICD-10-CM

## 2013-08-29 DIAGNOSIS — I493 Ventricular premature depolarization: Secondary | ICD-10-CM

## 2013-08-29 DIAGNOSIS — R001 Bradycardia, unspecified: Secondary | ICD-10-CM

## 2013-08-29 NOTE — Patient Instructions (Signed)
Your physician recommends that you continue on your current medications as directed. Please refer to the Current Medication list given to you today.  Your physician wants you to follow-up in: 6 months with Dr Turner You will receive a reminder letter in the mail two months in advance. If you don't receive a letter, please call our office to schedule the follow-up appointment.  

## 2013-08-30 DIAGNOSIS — M25569 Pain in unspecified knee: Secondary | ICD-10-CM | POA: Diagnosis not present

## 2013-09-06 ENCOUNTER — Other Ambulatory Visit (HOSPITAL_COMMUNITY): Payer: Self-pay | Admitting: Orthopedic Surgery

## 2013-09-06 ENCOUNTER — Encounter (HOSPITAL_COMMUNITY): Payer: Self-pay | Admitting: Pharmacy Technician

## 2013-09-06 NOTE — Patient Instructions (Addendum)
[place]  09/06/2013   Your procedure is scheduled on: Tuesday September 1st, 2015  Report to [facility] Main Entrance and follow signs to  [place] at 955 AM.  Call this number if you have problems the morning of surgery [phone]   Remember:  Do not eat food or drink liquids :After Midnight.     Take these medicines the morning of surgery with A SIP OF WATER: TOPROL, LEXAPRO                               You may not have any metal on your body including hair pins and piercings  Do not wear jewelry, make-up, lotions, powders, or deodorant.   Men may shave face and neck.  Do not bring valuables to the hospital. [place].  Contacts, dentures or bridgework may not be worn into surgery.  Leave suitcase in the car. After surgery it may be brought to your room.  For patients admitted to the hospital, checkout time is 11:00 AM the day of discharge.   ________________________________________________________________________  [facility] - Preparing for Surgery Before surgery, you can play an important role.  Because skin is not sterile, your skin needs to be as free of germs as possible.  You can reduce the number of germs on your skin by washing with CHG (chlorahexidine gluconate) soap before surgery.  CHG is an antiseptic cleaner which kills germs and bonds with the skin to continue killing germs even after washing. Please DO NOT use if you have an allergy to CHG or antibacterial soaps.  If your skin becomes reddened/irritated stop using the CHG and inform your nurse when you arrive at Short Stay. Do not shave (including legs and underarms) for at least 48 hours prior to the first CHG shower.  You may shave your face/neck. Please follow these instructions carefully:  1.  Shower with CHG Soap the night before surgery and the  morning of Surgery.  2.  If you choose to wash your hair, wash your hair first as usual with your   normal  shampoo.  3.  After you shampoo, rinse your hair and body thoroughly to remove the  shampoo.                           4.  Use CHG as you would any other liquid soap.  You can apply chg directly  to the skin and wash                       Gently with a scrungie or clean washcloth.  5.  Apply the CHG Soap to your body ONLY FROM THE NECK DOWN.   Do not use on face/ open                           Wound or open sores. Avoid contact with eyes, ears mouth and genitals (private parts).                       Wash face,  Genitals (private parts) with your normal soap.             6.  Wash thoroughly, paying special attention to the area where your surgery  will be performed.  7.  Thoroughly rinse your body with warm water from the neck down.  8.  DO NOT shower/wash with your normal soap after using and rinsing off  the CHG Soap.                9.  Pat yourself dry with a clean towel.            10.  Wear clean pajamas.            11.  Place clean sheets on your bed the night of your first shower and do not  sleep with pets. Day of Surgery : Do not apply any lotions/deodorants the morning of surgery.  Please wear clean clothes to the hospital/surgery center.  FAILURE TO FOLLOW THESE INSTRUCTIONS MAY RESULT IN THE CANCELLATION OF YOUR SURGERY PATIENT SIGNATURE_________________________________  NURSE SIGNATURE__________________________________  ________________________________________________________________________   Adam Phenix  An incentive spirometer is a tool that can help keep your lungs clear and active. This tool measures how well you are filling your lungs with each breath. Taking long deep breaths may help reverse or decrease the chance of developing breathing (pulmonary) problems (especially infection) following:  A long period of time when you are unable to move or be active. BEFORE THE PROCEDURE   If the spirometer includes an indicator to show your best effort, your  nurse or respiratory therapist will set it to a desired goal.  If possible, sit up straight or lean slightly forward. Try not to slouch.  Hold the incentive spirometer in an upright position. INSTRUCTIONS FOR USE  1. Sit on the edge of your bed if possible, or sit up as far as you can in bed or on a chair. 2. Hold the incentive spirometer in an upright position. 3. Breathe out normally. 4. Place the mouthpiece in your mouth and seal your lips tightly around it. 5. Breathe in slowly and as deeply as possible, raising the piston or the ball toward the top of the column. 6. Hold your breath for 3-5 seconds or for as long as possible. Allow the piston or ball to fall to the bottom of the column. 7. Remove the mouthpiece from your mouth and breathe out normally. 8. Rest for a few seconds and repeat Steps 1 through 7 at least 10 times every 1-2 hours when you are awake. Take your time and take a few normal breaths between deep breaths. 9. The spirometer may include an indicator to show your best effort. Use the indicator as a goal to work toward during each repetition. 10. After each set of 10 deep breaths, practice coughing to be sure your lungs are clear. If you have an incision (the cut made at the time of surgery), support your incision when coughing by placing a pillow or rolled up towels firmly against it. Once you are able to get out of bed, walk around indoors and cough well. You may stop using the incentive spirometer when instructed by your caregiver.  RISKS AND COMPLICATIONS  Take your time so you do not get dizzy or light-headed.  If you are in pain, you may need to take or ask for pain medication before doing incentive spirometry. It is harder to take a deep breath if you are having pain. AFTER USE  Rest and breathe slowly and easily.  It can be helpful to keep track of a log of your progress. Your caregiver can provide you with a simple table to help with this. If you are using the  spirometer at home, follow these instructions: [place] IF:   You are having difficultly using the spirometer.  You have trouble using the spirometer as often as instructed.  Your pain medication is not giving enough relief while using the spirometer.  You develop fever of 100.5 F (38.1 C) or higher. SEEK IMMEDIATE MEDICAL CARE IF:   You cough up bloody sputum that had not been present before.  You develop fever of 102 F (38.9 C) or greater.  You develop worsening pain at or near the incision site. MAKE SURE YOU:   Understand these instructions.  Will watch your condition.  Will get help right away if you are not doing well or get worse. Document Released: 05/16/2006 Document Revised: 03/28/2011 Document Reviewed: 07/17/2006 Exit[employer] Patient Information 2014 Exit[employer], [place].   ________________________________________________________________________  WHAT IS A BLOOD TRANSFUSION? Blood Transfusion Information  A transfusion is the replacement of blood or some of its parts. Blood is made up of multiple cells which provide different functions.  Red blood cells carry oxygen and are used for blood loss replacement.  White blood cells fight against infection.  Platelets control bleeding.  Plasma helps clot blood.  Other blood products are available for specialized needs, such as hemophilia or other clotting disorders. BEFORE THE TRANSFUSION  Who gives blood for transfusions?   Healthy volunteers who are fully evaluated to make sure their blood is safe. This is blood bank blood. Transfusion therapy is the safest it has ever been in the practice of medicine. Before blood is taken from a donor, a complete history is taken to make sure that person has no history of diseases nor engages in risky social behavior (examples are intravenous drug use or sexual activity with multiple partners). The donor's travel history is screened to minimize risk of transmitting  infections, such as malaria. The donated blood is tested for signs of infectious diseases, such as HIV and hepatitis. The blood is then tested to be sure it is compatible with you in order to minimize the chance of a transfusion reaction. If you or a relative donates blood, this is often done in anticipation of surgery and is not appropriate for emergency situations. It takes many days to process the donated blood. RISKS AND COMPLICATIONS Although transfusion therapy is very safe and saves many lives, the main dangers of transfusion include:   Getting an infectious disease.  Developing a transfusion reaction. This is an allergic reaction to something in the blood you were given. Every precaution is taken to prevent this. The decision to have a blood transfusion has been considered carefully by your caregiver before blood is given. Blood is not given unless the benefits outweigh the risks. AFTER THE TRANSFUSION  Right after receiving a blood transfusion, you will usually feel much better and more energetic. This is especially true if your red blood cells have gotten low (anemic). The transfusion raises the level of the red blood cells which carry oxygen, and this usually causes an energy increase.  The nurse administering the transfusion will monitor you carefully for complications. HOME CARE INSTRUCTIONS  No special instructions are needed after a transfusion. You may find your energy is better. Speak with your caregiver about any limitations on activity for underlying diseases you may have. SEEK MEDICAL CARE IF:   Your condition is not improving after your transfusion.  You develop redness or irritation at the intravenous (IV) site. SEEK IMMEDIATE MEDICAL CARE IF:  Any of the following symptoms occur over the  next 12 hours:  Shaking chills.  You have a temperature by mouth above 102 F (38.9 C), not controlled by medicine.  Chest, back, or muscle pain.  People around you feel you are  not acting correctly or are confused.  Shortness of breath or difficulty breathing.  Dizziness and fainting.  You get a rash or develop hives.  You have a decrease in urine output.  Your urine turns a dark color or changes to pink, red, or brown. Any of the following symptoms occur over the next 10 days:  You have a temperature by mouth above 102 F (38.9 C), not controlled by medicine.  Shortness of breath.  Weakness after normal activity.  The white part of the eye turns yellow (jaundice).  You have a decrease in the amount of urine or are urinating less often.  Your urine turns a dark color or changes to pink, red, or brown. Document Released: 01/01/2000 Document Revised: 03/28/2011 Document Reviewed: 08/20/2007 [facility] Patient Information 2014 [place], [place].  _______________________________________________________________________

## 2013-09-06 NOTE — Progress Notes (Signed)
Cardiac clearance note dr Tressia Miners turner on chart Medical clearnce note dr Joylene Draft on chart lov dr turner cardiology 08-29-13 epic ekg 07-08-13 epic holter monitor report 07-15-13 epic

## 2013-09-06 NOTE — Telephone Encounter (Signed)
error 

## 2013-09-10 DIAGNOSIS — R82998 Other abnormal findings in urine: Secondary | ICD-10-CM | POA: Diagnosis not present

## 2013-09-10 DIAGNOSIS — N390 Urinary tract infection, site not specified: Secondary | ICD-10-CM | POA: Diagnosis not present

## 2013-09-11 ENCOUNTER — Encounter (HOSPITAL_COMMUNITY): Payer: Self-pay

## 2013-09-11 ENCOUNTER — Ambulatory Visit (HOSPITAL_COMMUNITY)
Admission: RE | Admit: 2013-09-11 | Discharge: 2013-09-11 | Disposition: A | Discharge status: Home / Self Care | Payer: Medicare Other | Source: Ambulatory Visit | Attending: Anesthesiology | Admitting: Anesthesiology

## 2013-09-11 ENCOUNTER — Encounter (HOSPITAL_COMMUNITY)
Admission: RE | Admit: 2013-09-11 | Discharge: 2013-09-11 | Disposition: A | Discharge status: Home / Self Care | Payer: Medicare Other | Source: Ambulatory Visit | Attending: Orthopedic Surgery | Admitting: Orthopedic Surgery

## 2013-09-11 DIAGNOSIS — C50919 Malignant neoplasm of unspecified site of unspecified female breast: Secondary | ICD-10-CM | POA: Insufficient documentation

## 2013-09-11 DIAGNOSIS — M19019 Primary osteoarthritis, unspecified shoulder: Secondary | ICD-10-CM | POA: Diagnosis not present

## 2013-09-11 DIAGNOSIS — M4120 Other idiopathic scoliosis, site unspecified: Secondary | ICD-10-CM | POA: Insufficient documentation

## 2013-09-11 DIAGNOSIS — IMO0002 Reserved for concepts with insufficient information to code with codable children: Secondary | ICD-10-CM | POA: Insufficient documentation

## 2013-09-11 DIAGNOSIS — I10 Essential (primary) hypertension: Secondary | ICD-10-CM | POA: Diagnosis not present

## 2013-09-11 HISTORY — DX: Anxiety disorder, unspecified: F41.9

## 2013-09-11 HISTORY — DX: Urinary tract infection, site not specified: N39.0

## 2013-09-11 HISTORY — DX: Family history of other specified conditions: Z84.89

## 2013-09-11 HISTORY — DX: Pneumonia, unspecified organism: J18.9

## 2013-09-11 HISTORY — DX: Chronic obstructive pulmonary disease, unspecified: J44.9

## 2013-09-11 LAB — CBC
HCT: 40.7 % (ref 36.0–46.0)
HEMOGLOBIN: 13.7 g/dL (ref 12.0–15.0)
MCH: 32.5 pg (ref 26.0–34.0)
MCHC: 33.7 g/dL (ref 30.0–36.0)
MCV: 96.4 fL (ref 78.0–100.0)
Platelets: 194 10*3/uL (ref 150–400)
RBC: 4.22 MIL/uL (ref 3.87–5.11)
RDW: 12.8 % (ref 11.5–15.5)
WBC: 6 10*3/uL (ref 4.0–10.5)

## 2013-09-11 LAB — SURGICAL PCR SCREEN
MRSA, PCR: NEGATIVE
STAPHYLOCOCCUS AUREUS: NEGATIVE

## 2013-09-11 LAB — BASIC METABOLIC PANEL
ANION GAP: 11 (ref 5–15)
BUN: 17 mg/dL (ref 6–23)
CALCIUM: 9.5 mg/dL (ref 8.4–10.5)
CHLORIDE: 98 meq/L (ref 96–112)
CO2: 26 mEq/L (ref 19–32)
CREATININE: 0.81 mg/dL (ref 0.50–1.10)
GFR calc non Af Amer: 66 mL/min — ABNORMAL LOW (ref >90)
GFR, EST AFRICAN AMERICAN: 77 mL/min — AB (ref >90)
Glucose, Bld: 91 mg/dL (ref 70–99)
Potassium: 4.7 mEq/L (ref 3.7–5.3)
Sodium: 135 mEq/L — ABNORMAL LOW (ref 137–147)

## 2013-09-11 LAB — PROTIME-INR
INR: 1.03 (ref 0.00–1.49)
PROTHROMBIN TIME: 13.5 s (ref 11.6–15.2)

## 2013-09-11 LAB — APTT: APTT: 29 s (ref 24–37)

## 2013-09-11 NOTE — H&P (Signed)
TOTAL KNEE ADMISSION H&P  Patient is being admitted for left total knee arthroplasty.  Subjective:  Chief Complaint:    Left knee pain.  HPI: JALEISA BROSE, 80 y.o. female, has a history of pain and functional disability in the left knee due to arthritis and has failed non-surgical conservative treatments for greater than 12 weeks to include NSAID's and/or analgesics, corticosteriod injections and activity modification.  Onset of symptoms was gradual, starting 2+ years ago with gradually worsening course since that time. The patient noted no past surgery on the left knee(s).  Patient currently rates pain in the left knee(s) at 5 out of 10 with activity. Patient has worsening of pain with activity and weight bearing, pain that interferes with activities of daily living, pain with passive range of motion, crepitus and joint swelling.  Patient has evidence of periarticular osteophytes and joint space narrowing by imaging studies.  There is no active infection.  Risks, benefits and expectations were discussed with the patient.  Risks including but not limited to the risk of anesthesia, blood clots, nerve damage, blood vessel damage, failure of the prosthesis, infection and up to and including death.  Patient understand the risks, benefits and expectations and wishes to proceed with surgery.   PCP: Jerlyn Ly, MD  D/C Plans:      SNF - [facility]  Post-op Meds:       No Rx given  Tranexamic Acid:      To be given - IV   Decadron:      Is to be given  FYI:     ASA post-op  Norco post-op    Patient Active Problem List   Diagnosis Date Noted  . Bradycardia   . PVC's (premature ventricular contractions)   . Hypertension   . DEGENERATIVE JOINT DISEASE, KNEE 09/01/2009  . ROTATOR CUFF SYNDROME, LEFT 09/01/2009  . GENU VALGUM 09/01/2009  . 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 UNEQUAL LEG LENGTH 09/01/2009   Past Medical History  Diagnosis Date  . DEGENERATIVE  JOINT DISEASE, KNEE   . GENU VALGUM   . 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 ROTATOR CUFF SYNDROME, LEFT     CANNOT LIFT LEFT ARM ALL THE WAY UP  . UNEQUAL LEG LENGTH   . Bradycardia     asymptomatic  . PVC's (premature ventricular contractions)   . Hypertension   . Hyperlipidemia   . COPD (chronic obstructive pulmonary disease)     MILD, NO INHALERS USED  . Pneumonia 2013    X 2  . Anxiety   . UTI (urinary tract infection)     STARTED AUGMENTIN  ON 09-10-13  . Breast CA 1993 OR 1994    LEFT, SURGERY AND RADIATION DONE  . Family history of anesthesia complication     NEPHEW NAUSEA/VOMITING    Past Surgical History  Procedure Laterality Date  . Total hip arthroplasty Bilateral LEFT 2008 AND RIGHT 2010  . Breast surgery Left 1993 OR 1994    LUMPECTOMY AND RADIATION DONE  . Appendectomy  AGE 77  . Nasal sinus surgery  2006    No prescriptions prior to admission   Allergies  Allergen Reactions  . Cardizem [Diltiazem Hcl]     hypotension  . Other     PAIN MEDICATION CAUSES NAUSEA--? name    History  Substance Use Topics  . Smoking status: Former Smoker -- 0.25 packs/day for 15 years    Types:  Cigarettes    Quit date: 01/18/1992  . Smokeless tobacco: Never Used  . Alcohol Use: Yes     Comment: WINE 2 GLASSES PER DAY    No family history on file.   Review of Systems  Constitutional: Negative.   HENT: Negative.   Eyes: Negative.   Respiratory: Negative.   Cardiovascular: Negative.   Gastrointestinal: Negative.   Genitourinary: Negative.   Musculoskeletal: Positive for joint pain.  Skin: Negative.   Neurological: Negative.   Endo/Heme/Allergies: Negative.   Psychiatric/Behavioral: The patient is nervous/anxious.     Objective:  Physical Exam  Constitutional: She is oriented to person, place, and time. She appears well-developed and well-nourished.  HENT:  Head: Normocephalic and atraumatic.  Mouth/Throat: Oropharynx is clear and moist.   Eyes: Pupils are equal, round, and reactive to light.  Neck: Neck supple. No JVD present. No tracheal deviation present. No thyromegaly present.  Cardiovascular: Normal rate, regular rhythm, normal heart sounds and intact distal pulses.   Respiratory: Effort normal and breath sounds normal. No stridor. No respiratory distress. She has no wheezes.  GI: Soft. There is no tenderness. There is no guarding.  Musculoskeletal:       Left knee: She exhibits decreased range of motion, swelling, abnormal alignment (valgus) and bony tenderness. She exhibits no deformity, no laceration and no erythema. Tenderness found.  Lymphadenopathy:    She has no cervical adenopathy.  Neurological: She is alert and oriented to person, place, and time.  Skin: Skin is warm and dry.  Psychiatric: She has a normal mood and affect.    Vital signs in last 24 hours: Temp:  [98 F (36.7 C)] 98 F (36.7 C) (08/26 1113) Pulse Rate:  [60] 60 (08/26 1113) Resp:  [18] 18 (08/26 1113) BP: (130)/(78) 130/78 mmHg (08/26 1113) SpO2:  [98 %] 98 % (08/26 1113) Weight:  [63.504 kg (140 lb)] 63.504 kg (140 lb) (08/26 1113)  Labs:   Estimated body mass index is 22.80 kg/(m^2) as calculated from the following:   Height as of 12/17/12: 5\' 5"  (1.651 m).   Weight as of 12/17/12: 62.143 kg (137 lb).   Imaging Review Plain radiographs demonstrate moderate degenerative joint disease of the left knee(s). The overall alignment is mild valgus. The bone quality appears to be good for age and reported activity level.  Assessment/Plan:  End stage arthritis, left knee   The patient history, physical examination, clinical judgment of the provider and imaging studies are consistent with end stage degenerative joint disease of the left knee(s) and total knee arthroplasty is deemed medically necessary. The treatment options including medical management, injection therapy arthroscopy and arthroplasty were discussed at length. The risks and  benefits of total knee arthroplasty were presented and reviewed. The risks due to aseptic loosening, infection, stiffness, patella tracking problems, thromboembolic complications and other imponderables were discussed. The patient acknowledged the explanation, agreed to proceed with the plan and consent was signed. Patient is being admitted for inpatient treatment for surgery, pain control, PT, OT, prophylactic antibiotics, VTE prophylaxis, progressive ambulation and ADL's and discharge planning. The patient is planning to be discharged to skilled nursing facility.     [place] Djimon Lundstrom   PA-C  09/11/2013, 3:11 PM

## 2013-09-11 NOTE — Progress Notes (Signed)
UA AND MICRO 09-10-13 DR  MARK PERINI ON CHART

## 2013-09-17 ENCOUNTER — Inpatient Hospital Stay (HOSPITAL_COMMUNITY)
Admission: RE | Admit: 2013-09-17 | Discharge: 2013-09-19 | DRG: 470 | Disposition: A | Discharge status: Skilled Nursing Facility | Payer: Medicare Other | Source: Ambulatory Visit | Attending: Orthopedic Surgery | Admitting: Orthopedic Surgery

## 2013-09-17 ENCOUNTER — Encounter (HOSPITAL_COMMUNITY): Payer: Medicare Other | Admitting: Anesthesiology

## 2013-09-17 ENCOUNTER — Inpatient Hospital Stay (HOSPITAL_COMMUNITY): Payer: Medicare Other | Admitting: Anesthesiology

## 2013-09-17 ENCOUNTER — Encounter (HOSPITAL_COMMUNITY)
Admission: RE | Disposition: A | Discharge status: Skilled Nursing Facility | Payer: Self-pay | Source: Ambulatory Visit | Attending: Orthopedic Surgery

## 2013-09-17 ENCOUNTER — Encounter (HOSPITAL_COMMUNITY): Payer: Self-pay

## 2013-09-17 DIAGNOSIS — D500 Iron deficiency anemia secondary to blood loss (chronic): Secondary | ICD-10-CM | POA: Diagnosis present

## 2013-09-17 DIAGNOSIS — Z87891 Personal history of nicotine dependence: Secondary | ICD-10-CM | POA: Diagnosis not present

## 2013-09-17 DIAGNOSIS — M2540 Effusion, unspecified joint: Secondary | ICD-10-CM | POA: Diagnosis present

## 2013-09-17 DIAGNOSIS — Z471 Aftercare following joint replacement surgery: Secondary | ICD-10-CM | POA: Diagnosis not present

## 2013-09-17 DIAGNOSIS — Z96649 Presence of unspecified artificial hip joint: Secondary | ICD-10-CM | POA: Diagnosis not present

## 2013-09-17 DIAGNOSIS — D649 Anemia, unspecified: Secondary | ICD-10-CM | POA: Diagnosis not present

## 2013-09-17 DIAGNOSIS — M25569 Pain in unspecified knee: Secondary | ICD-10-CM | POA: Diagnosis not present

## 2013-09-17 DIAGNOSIS — Z96659 Presence of unspecified artificial knee joint: Secondary | ICD-10-CM | POA: Diagnosis not present

## 2013-09-17 DIAGNOSIS — M1710 Unilateral primary osteoarthritis, unspecified knee: Secondary | ICD-10-CM | POA: Diagnosis not present

## 2013-09-17 DIAGNOSIS — I10 Essential (primary) hypertension: Secondary | ICD-10-CM | POA: Diagnosis present

## 2013-09-17 DIAGNOSIS — J4489 Other specified chronic obstructive pulmonary disease: Secondary | ICD-10-CM | POA: Diagnosis present

## 2013-09-17 DIAGNOSIS — E785 Hyperlipidemia, unspecified: Secondary | ICD-10-CM | POA: Diagnosis present

## 2013-09-17 DIAGNOSIS — Z96652 Presence of left artificial knee joint: Secondary | ICD-10-CM

## 2013-09-17 DIAGNOSIS — Z853 Personal history of malignant neoplasm of breast: Secondary | ICD-10-CM | POA: Diagnosis not present

## 2013-09-17 DIAGNOSIS — M6281 Muscle weakness (generalized): Secondary | ICD-10-CM | POA: Diagnosis not present

## 2013-09-17 DIAGNOSIS — J449 Chronic obstructive pulmonary disease, unspecified: Secondary | ICD-10-CM | POA: Diagnosis not present

## 2013-09-17 DIAGNOSIS — D62 Acute posthemorrhagic anemia: Secondary | ICD-10-CM | POA: Diagnosis not present

## 2013-09-17 DIAGNOSIS — F329 Major depressive disorder, single episode, unspecified: Secondary | ICD-10-CM | POA: Diagnosis not present

## 2013-09-17 DIAGNOSIS — F3289 Other specified depressive episodes: Secondary | ICD-10-CM | POA: Diagnosis not present

## 2013-09-17 HISTORY — PX: TOTAL KNEE ARTHROPLASTY: SHX125

## 2013-09-17 HISTORY — DX: Anesthesia of skin: R20.0

## 2013-09-17 LAB — TYPE AND SCREEN
ABO/RH(D): O NEG
Antibody Screen: NEGATIVE

## 2013-09-17 SURGERY — ARTHROPLASTY, KNEE, TOTAL
Anesthesia: Spinal | Site: Knee | Laterality: Left

## 2013-09-17 MED ORDER — MIDAZOLAM HCL 5 MG/5ML IJ SOLN
INTRAMUSCULAR | Status: DC | PRN
  Administered 2013-09-17 (×2): 1 mg via INTRAVENOUS

## 2013-09-17 MED ORDER — DEXAMETHASONE SODIUM PHOSPHATE 10 MG/ML IJ SOLN: 10.0000 mg | Freq: Once | INTRAMUSCULAR | Status: DC

## 2013-09-17 MED ORDER — PROMETHAZINE HCL 25 MG/ML IJ SOLN: 6.2500 mg | INTRAMUSCULAR | Status: DC | PRN

## 2013-09-17 MED ORDER — OXYCODONE HCL 5 MG/5ML PO SOLN
5.0000 mg | Freq: Once | ORAL | Status: DC | PRN
  Filled 2013-09-17: qty 5

## 2013-09-17 MED ORDER — FERROUS SULFATE 325 (65 FE) MG PO TABS
325.0000 mg | ORAL_TABLET | Freq: Three times a day (TID) | ORAL | Status: DC
  Administered 2013-09-17 – 2013-09-19 (×5): 325 mg via ORAL
  Filled 2013-09-17 (×8): qty 1

## 2013-09-17 MED ORDER — OXYCODONE HCL 5 MG PO TABS: 5.0000 mg | ORAL_TABLET | Freq: Once | ORAL | Status: DC | PRN

## 2013-09-17 MED ORDER — ESCITALOPRAM OXALATE 10 MG PO TABS
10.0000 mg | ORAL_TABLET | Freq: Every morning | ORAL | Status: DC
  Administered 2013-09-18 – 2013-09-19 (×2): 10 mg via ORAL
  Filled 2013-09-17 (×2): qty 1

## 2013-09-17 MED ORDER — PHENYLEPHRINE HCL 10 MG/ML IJ SOLN
INTRAMUSCULAR | Status: DC | PRN
  Administered 2013-09-17: 80 ug via INTRAVENOUS

## 2013-09-17 MED ORDER — CEFAZOLIN SODIUM-DEXTROSE 2-3 GM-% IV SOLR
2.0000 g | Freq: Four times a day (QID) | INTRAVENOUS | Status: AC
  Administered 2013-09-17 – 2013-09-18 (×2): 2 g via INTRAVENOUS
  Filled 2013-09-17 (×2): qty 50

## 2013-09-17 MED ORDER — SODIUM CHLORIDE 0.9 % IR SOLN
Status: DC | PRN
  Administered 2013-09-17: 1000 mL

## 2013-09-17 MED ORDER — SODIUM CHLORIDE 0.9 % IV SOLN
1000.0000 mg | Freq: Once | INTRAVENOUS | Status: AC
  Administered 2013-09-17: 1000 mg via INTRAVENOUS
  Filled 2013-09-17: qty 10

## 2013-09-17 MED ORDER — BUPIVACAINE-EPINEPHRINE (PF) 0.25% -1:200000 IJ SOLN
INTRAMUSCULAR | Status: AC
  Filled 2013-09-17: qty 30

## 2013-09-17 MED ORDER — KETOROLAC TROMETHAMINE 30 MG/ML IJ SOLN
INTRAMUSCULAR | Status: AC
  Filled 2013-09-17: qty 1

## 2013-09-17 MED ORDER — METOCLOPRAMIDE HCL 10 MG PO TABS: 5.0000 mg | ORAL_TABLET | Freq: Three times a day (TID) | ORAL | Status: DC | PRN

## 2013-09-17 MED ORDER — CELECOXIB 200 MG PO CAPS
200.0000 mg | ORAL_CAPSULE | Freq: Two times a day (BID) | ORAL | Status: DC
  Administered 2013-09-17 – 2013-09-19 (×4): 200 mg via ORAL
  Filled 2013-09-17 (×5): qty 1

## 2013-09-17 MED ORDER — DIPHENHYDRAMINE HCL 25 MG PO CAPS: 25.0000 mg | ORAL_CAPSULE | Freq: Four times a day (QID) | ORAL | Status: DC | PRN

## 2013-09-17 MED ORDER — BUPIVACAINE LIPOSOME 1.3 % IJ SUSP
20.0000 mL | Freq: Once | INTRAMUSCULAR | Status: AC
  Administered 2013-09-17: 20 mL
  Filled 2013-09-17: qty 20

## 2013-09-17 MED ORDER — LACTATED RINGERS IV SOLN
INTRAVENOUS | Status: DC
  Administered 2013-09-17: 1000 mL via INTRAVENOUS
  Administered 2013-09-17 (×2): via INTRAVENOUS

## 2013-09-17 MED ORDER — MENTHOL 3 MG MT LOZG
1.0000 | LOZENGE | OROMUCOSAL | Status: DC | PRN
  Filled 2013-09-17: qty 9

## 2013-09-17 MED ORDER — PROPOFOL 10 MG/ML IV BOLUS
INTRAVENOUS | Status: DC | PRN
  Administered 2013-09-17: 20 mg via INTRAVENOUS

## 2013-09-17 MED ORDER — PROPOFOL INFUSION 10 MG/ML OPTIME
INTRAVENOUS | Status: DC | PRN
  Administered 2013-09-17: 75 ug/kg/min via INTRAVENOUS

## 2013-09-17 MED ORDER — POTASSIUM CHLORIDE 2 MEQ/ML IV SOLN
INTRAVENOUS | Status: DC
  Administered 2013-09-17: 19:00:00 via INTRAVENOUS
  Filled 2013-09-17 (×6): qty 1000

## 2013-09-17 MED ORDER — PROPOFOL 10 MG/ML IV BOLUS
INTRAVENOUS | Status: AC
  Filled 2013-09-17: qty 20

## 2013-09-17 MED ORDER — FENTANYL CITRATE 0.05 MG/ML IJ SOLN
INTRAMUSCULAR | Status: DC | PRN
  Administered 2013-09-17: 50 ug via INTRAVENOUS

## 2013-09-17 MED ORDER — MIDAZOLAM HCL 2 MG/2ML IJ SOLN
INTRAMUSCULAR | Status: AC
  Filled 2013-09-17: qty 2

## 2013-09-17 MED ORDER — DIPHENOXYLATE-ATROPINE 2.5-0.025 MG PO TABS: 1.0000 | ORAL_TABLET | Freq: Four times a day (QID) | ORAL | Status: DC | PRN

## 2013-09-17 MED ORDER — ONDANSETRON HCL 4 MG/2ML IJ SOLN: 4.0000 mg | Freq: Four times a day (QID) | INTRAMUSCULAR | Status: DC | PRN

## 2013-09-17 MED ORDER — HYDROMORPHONE HCL PF 1 MG/ML IJ SOLN: 0.2500 mg | INTRAMUSCULAR | Status: DC | PRN

## 2013-09-17 MED ORDER — ALUM & MAG HYDROXIDE-SIMETH 200-200-20 MG/5ML PO SUSP: 30.0000 mL | ORAL | Status: DC | PRN

## 2013-09-17 MED ORDER — ONDANSETRON HCL 4 MG/2ML IJ SOLN
INTRAMUSCULAR | Status: DC | PRN
  Administered 2013-09-17: 4 mg via INTRAVENOUS

## 2013-09-17 MED ORDER — ONDANSETRON HCL 4 MG/2ML IJ SOLN
INTRAMUSCULAR | Status: AC
  Filled 2013-09-17: qty 2

## 2013-09-17 MED ORDER — METOPROLOL SUCCINATE ER 50 MG PO TB24
50.0000 mg | ORAL_TABLET | Freq: Every morning | ORAL | Status: DC
  Administered 2013-09-19: 50 mg via ORAL
  Filled 2013-09-17 (×2): qty 1

## 2013-09-17 MED ORDER — HYDROCODONE-ACETAMINOPHEN 7.5-325 MG PO TABS
1.0000 | ORAL_TABLET | ORAL | Status: DC
  Administered 2013-09-17 – 2013-09-18 (×2): 1 via ORAL
  Filled 2013-09-17 (×2): qty 1

## 2013-09-17 MED ORDER — DEXAMETHASONE SODIUM PHOSPHATE 10 MG/ML IJ SOLN
10.0000 mg | Freq: Once | INTRAMUSCULAR | Status: AC
  Administered 2013-09-18: 10 mg via INTRAVENOUS
  Filled 2013-09-17: qty 1

## 2013-09-17 MED ORDER — DOCUSATE SODIUM 100 MG PO CAPS
100.0000 mg | ORAL_CAPSULE | Freq: Two times a day (BID) | ORAL | Status: DC
  Administered 2013-09-17 – 2013-09-19 (×4): 100 mg via ORAL

## 2013-09-17 MED ORDER — ONDANSETRON HCL 4 MG PO TABS
4.0000 mg | ORAL_TABLET | Freq: Four times a day (QID) | ORAL | Status: DC | PRN
  Filled 2013-09-17: qty 1

## 2013-09-17 MED ORDER — FENTANYL CITRATE 0.05 MG/ML IJ SOLN
INTRAMUSCULAR | Status: AC
  Filled 2013-09-17: qty 5

## 2013-09-17 MED ORDER — CEFAZOLIN SODIUM-DEXTROSE 2-3 GM-% IV SOLR
2.0000 g | INTRAVENOUS | Status: AC
  Administered 2013-09-17: 2 g via INTRAVENOUS

## 2013-09-17 MED ORDER — KETOROLAC TROMETHAMINE 30 MG/ML IJ SOLN
INTRAMUSCULAR | Status: DC | PRN
  Administered 2013-09-17: 30 mg via INTRAVENOUS

## 2013-09-17 MED ORDER — MAGNESIUM CITRATE PO SOLN: 1.0000 | Freq: Once | ORAL | Status: AC | PRN

## 2013-09-17 MED ORDER — METHOCARBAMOL 500 MG PO TABS
500.0000 mg | ORAL_TABLET | Freq: Four times a day (QID) | ORAL | Status: DC | PRN
  Administered 2013-09-18 – 2013-09-19 (×3): 500 mg via ORAL
  Filled 2013-09-17 (×3): qty 1

## 2013-09-17 MED ORDER — SODIUM CHLORIDE 0.9 % IJ SOLN
INTRAMUSCULAR | Status: DC | PRN
  Administered 2013-09-17: 30 mL via INTRAVENOUS

## 2013-09-17 MED ORDER — METOCLOPRAMIDE HCL 5 MG/ML IJ SOLN: 5.0000 mg | Freq: Three times a day (TID) | INTRAMUSCULAR | Status: DC | PRN

## 2013-09-17 MED ORDER — MEPERIDINE HCL 50 MG/ML IJ SOLN: 6.2500 mg | INTRAMUSCULAR | Status: DC | PRN

## 2013-09-17 MED ORDER — HYDROMORPHONE HCL PF 1 MG/ML IJ SOLN: 0.5000 mg | INTRAMUSCULAR | Status: DC | PRN

## 2013-09-17 MED ORDER — KETOTIFEN FUMARATE 0.025 % OP SOLN
1.0000 [drp] | Freq: Two times a day (BID) | OPHTHALMIC | Status: DC | PRN
  Filled 2013-09-17: qty 5

## 2013-09-17 MED ORDER — ASPIRIN EC 325 MG PO TBEC
325.0000 mg | DELAYED_RELEASE_TABLET | Freq: Two times a day (BID) | ORAL | Status: DC
  Administered 2013-09-18 – 2013-09-19 (×3): 325 mg via ORAL
  Filled 2013-09-17 (×5): qty 1

## 2013-09-17 MED ORDER — 0.9 % SODIUM CHLORIDE (POUR BTL) OPTIME
TOPICAL | Status: DC | PRN
  Administered 2013-09-17: 1000 mL

## 2013-09-17 MED ORDER — PHENOL 1.4 % MT LIQD
1.0000 | OROMUCOSAL | Status: DC | PRN
Start: 2013-09-17 — End: 2013-09-19
  Filled 2013-09-17: qty 177

## 2013-09-17 MED ORDER — POLYETHYLENE GLYCOL 3350 17 G PO PACK
17.0000 g | PACK | Freq: Two times a day (BID) | ORAL | Status: DC
  Administered 2013-09-18 – 2013-09-19 (×3): 17 g via ORAL

## 2013-09-17 MED ORDER — BISACODYL 10 MG RE SUPP: 10.0000 mg | Freq: Every day | RECTAL | Status: DC | PRN

## 2013-09-17 MED ORDER — EPHEDRINE SULFATE 50 MG/ML IJ SOLN
INTRAMUSCULAR | Status: DC | PRN
  Administered 2013-09-17: 10 mg via INTRAVENOUS

## 2013-09-17 MED ORDER — SODIUM CHLORIDE 0.9 % IJ SOLN
INTRAMUSCULAR | Status: AC
  Filled 2013-09-17: qty 10

## 2013-09-17 MED ORDER — BUPIVACAINE-EPINEPHRINE (PF) 0.25% -1:200000 IJ SOLN
INTRAMUSCULAR | Status: DC | PRN
  Administered 2013-09-17: 30 mL

## 2013-09-17 MED ORDER — CEFAZOLIN SODIUM-DEXTROSE 2-3 GM-% IV SOLR
INTRAVENOUS | Status: AC
  Filled 2013-09-17: qty 50

## 2013-09-17 MED ORDER — METHOCARBAMOL 1000 MG/10ML IJ SOLN
500.0000 mg | Freq: Four times a day (QID) | INTRAMUSCULAR | Status: DC | PRN
  Administered 2013-09-17: 500 mg via INTRAVENOUS
  Filled 2013-09-17: qty 5

## 2013-09-17 MED ORDER — PHENYLEPHRINE HCL 10 MG/ML IJ SOLN
10.0000 mg | INTRAVENOUS | Status: DC | PRN
  Administered 2013-09-17: 10 ug/min via INTRAVENOUS

## 2013-09-17 MED ORDER — ATORVASTATIN CALCIUM 20 MG PO TABS
20.0000 mg | ORAL_TABLET | Freq: Every evening | ORAL | Status: DC
  Administered 2013-09-17 – 2013-09-18 (×2): 20 mg via ORAL
  Filled 2013-09-17 (×3): qty 1

## 2013-09-17 SURGICAL SUPPLY — 53 items
ADH SKN CLS APL DERMABOND .7 (GAUZE/BANDAGES/DRESSINGS) ×1
BAG SPEC THK2 15X12 ZIP CLS (MISCELLANEOUS)
BAG ZIPLOCK 12X15 (MISCELLANEOUS) IMPLANT
BANDAGE ELASTIC 6 VELCRO ST LF (GAUZE/BANDAGES/DRESSINGS) ×2 IMPLANT
BANDAGE ESMARK 6X9 LF (GAUZE/BANDAGES/DRESSINGS) ×1 IMPLANT
BLADE SAW SGTL 13.0X1.19X90.0M (BLADE) ×2 IMPLANT
BNDG CMPR 9X6 STRL LF SNTH (GAUZE/BANDAGES/DRESSINGS) ×1
BNDG ESMARK 6X9 LF (GAUZE/BANDAGES/DRESSINGS) ×2
BOWL SMART MIX CTS (DISPOSABLE) ×2 IMPLANT
CAPT RP KNEE ×1 IMPLANT
CEMENT HV SMART SET (Cement) ×2 IMPLANT
CUFF TOURN SGL QUICK 34 (TOURNIQUET CUFF) ×2
CUFF TRNQT CYL 34X4X40X1 (TOURNIQUET CUFF) ×1 IMPLANT
DERMABOND ADVANCED (GAUZE/BANDAGES/DRESSINGS) ×1
DERMABOND ADVANCED .7 DNX12 (GAUZE/BANDAGES/DRESSINGS) ×1 IMPLANT
DRAPE EXTREMITY T 121X128X90 (DRAPE) ×2 IMPLANT
DRAPE POUCH INSTRU U-SHP 10X18 (DRAPES) ×2 IMPLANT
DRAPE U-SHAPE 47X51 STRL (DRAPES) ×2 IMPLANT
DRSG AQUACEL AG ADV 3.5X10 (GAUZE/BANDAGES/DRESSINGS) ×2 IMPLANT
DURAPREP 26ML APPLICATOR (WOUND CARE) ×4 IMPLANT
ELECT REM PT RETURN 9FT ADLT (ELECTROSURGICAL) ×2
ELECTRODE REM PT RTRN 9FT ADLT (ELECTROSURGICAL) ×1 IMPLANT
FACESHIELD WRAPAROUND (MASK) ×12 IMPLANT
FACESHIELD WRAPAROUND OR TEAM (MASK) ×5 IMPLANT
GLOVE BIOGEL PI IND STRL 7.5 (GLOVE) ×1 IMPLANT
GLOVE BIOGEL PI IND STRL 8.5 (GLOVE) ×1 IMPLANT
GLOVE BIOGEL PI INDICATOR 7.5 (GLOVE) ×1
GLOVE BIOGEL PI INDICATOR 8.5 (GLOVE) ×1
GLOVE ECLIPSE 8.0 STRL XLNG CF (GLOVE) ×2 IMPLANT
GLOVE ORTHO TXT STRL SZ7.5 (GLOVE) ×4 IMPLANT
GOWN SPEC L3 XXLG W/TWL (GOWN DISPOSABLE) ×3 IMPLANT
GOWN STRL REUS W/TWL LRG LVL3 (GOWN DISPOSABLE) ×4 IMPLANT
HANDPIECE INTERPULSE COAX TIP (DISPOSABLE) ×2
KIT BASIN OR (CUSTOM PROCEDURE TRAY) ×2 IMPLANT
MANIFOLD NEPTUNE II (INSTRUMENTS) ×2 IMPLANT
NDL SAFETY ECLIPSE 18X1.5 (NEEDLE) ×1 IMPLANT
NEEDLE HYPO 18GX1.5 SHARP (NEEDLE) ×2
PACK TOTAL JOINT (CUSTOM PROCEDURE TRAY) ×2 IMPLANT
POSITIONER SURGICAL ARM (MISCELLANEOUS) ×2 IMPLANT
SET HNDPC FAN SPRY TIP SCT (DISPOSABLE) ×1 IMPLANT
SET PAD KNEE POSITIONER (MISCELLANEOUS) ×2 IMPLANT
SUCTION FRAZIER 12FR DISP (SUCTIONS) ×2 IMPLANT
SUT MNCRL AB 4-0 PS2 18 (SUTURE) ×2 IMPLANT
SUT VIC AB 1 CT1 36 (SUTURE) ×2 IMPLANT
SUT VIC AB 2-0 CT1 27 (SUTURE) ×6
SUT VIC AB 2-0 CT1 TAPERPNT 27 (SUTURE) ×3 IMPLANT
SUT VLOC 180 0 24IN GS25 (SUTURE) ×2 IMPLANT
SYR 50ML LL SCALE MARK (SYRINGE) ×2 IMPLANT
TOWEL OR 17X26 10 PK STRL BLUE (TOWEL DISPOSABLE) ×2 IMPLANT
TOWEL OR NON WOVEN STRL DISP B (DISPOSABLE) IMPLANT
TRAY FOLEY CATH 14FRSI W/METER (CATHETERS) ×2 IMPLANT
WATER STERILE IRR 1500ML POUR (IV SOLUTION) ×2 IMPLANT
WRAP KNEE MAXI GEL POST OP (GAUZE/BANDAGES/DRESSINGS) ×2 IMPLANT

## 2013-09-17 NOTE — Transfer of Care (Signed)
Immediate Anesthesia Transfer of Care Note  Patient: [name] [name]  Procedure(s) Performed: Procedure(s) (LRB): LEFT TOTAL KNEE ARTHROPLASTY (Left)  Patient Location: PACU  Anesthesia Type: Spinal  Level of Consciousness: sedated, patient cooperative and responds to stimulation  Airway & Oxygen Therapy: Patient Spontanous Breathing and Patient connected to face mask oxgen  Post-op Assessment: Report given to PACU RN and Post -op Vital signs reviewed and stable  Post vital signs: Reviewed and stable  Complications: No apparent anesthesia complications

## 2013-09-17 NOTE — Op Note (Signed)
NAME:  [name] [name]                      MEDICAL RECORD NO.:  [id number]                             FACILITY:  [facility]      PHYSICIAN:  Pietro Cassis. Alvan Dame, M.D.  DATE OF BIRTH:  [birth year]      DATE OF PROCEDURE:  09/17/2013                                     OPERATIVE REPORT         PREOPERATIVE DIAGNOSIS:  Left knee osteoarthritis.      POSTOPERATIVE DIAGNOSIS:  Left knee osteoarthritis.      FINDINGS:  The patient was noted to have complete loss of cartilage and   bone-on-bone arthritis with associated osteophytes in the lateral and patellofemoral compartments of   the knee with a significant synovitis and associated effusion.      PROCEDURE:  Left total knee replacement.      COMPONENTS USED:  DePuy rotating platform posterior stabilized knee   system, a size 3 femur, 3 tibia, 12.5 mm PS insert, and 38 patellar   button.      SURGEON:  Pietro Cassis. Alvan Dame, M.D.      ASSISTANT:  Danae Orleans, PA-C.      ANESTHESIA:  Spinal.      SPECIMENS:  None.      COMPLICATION:  None.      DRAINS:  None.  EBL: <100cc      TOURNIQUET TIME:   Total Tourniquet Time Documented: Thigh (Left) - 31 minutes Total: Thigh (Left) - 31 minutes  .      The patient was stable to the recovery room.      INDICATION FOR PROCEDURE:  [name] [name] is a 80 y.o. female patient of   mine.  The patient had been seen, evaluated, and treated conservatively in the   office with medication, activity modification, and injections.  The patient had   radiographic changes of bone-on-bone arthritis with endplate sclerosis and osteophytes noted.      The patient failed conservative measures including medication, injections, and activity modification, and at this point was ready for more definitive measures.   Based on the radiographic changes and failed conservative measures, the patient   decided to proceed with total knee replacement.  Risks of infection,   DVT, component failure, need for revision  surgery, postop course, and   expectations were all   discussed and reviewed.  Consent was obtained for benefit of pain   relief.      PROCEDURE IN DETAIL:  The patient was brought to the operative theater.   Once adequate anesthesia, preoperative antibiotics, 2 gm of Ancef administered, the patient was positioned supine with the left thigh tourniquet placed.  The  left lower extremity was prepped and draped in sterile fashion.  A time-   out was performed identifying the patient, planned procedure, and   extremity.      The left lower extremity was placed in the [facility] leg holder.  The leg was   exsanguinated, tourniquet elevated to 250 mmHg.  A midline incision was   made followed by median parapatellar arthrotomy.  Following initial   exposure, attention was first  directed to the patella.  Precut   measurement was noted to be 23 mm.  I resected down to 14 mm and used a   38 patellar button to restore patellar height as well as cover the cut   surface.      The lug holes were drilled and a metal shim was placed to protect the   patella from retractors and saw blades.      At this point, attention was now directed to the femur.  The femoral   canal was opened with a drill, irrigated to try to prevent fat emboli.  An   intramedullary rod was passed at 5 degrees valgus, 9 mm of bone was   resected off the distal femur due to pre-operative hyperextension.  Following this resection, the tibia was   subluxated anteriorly.  Using the extramedullary guide, 4 mm of bone was resected off   the proximal lateral tibia.  We confirmed the gap would be   stable medially and laterally with a 10 mm insert as well as confirmed   the cut was perpendicular in the coronal plane, checking with an alignment rod.      Once this was done, I sized the femur to be a size 3 in the anterior-   posterior dimension, chose a standard component based on medial and   lateral dimension.  The size 3 rotation block  was then pinned in   position anterior referenced using the C-clamp to set rotation.  The   anterior, posterior, and  chamfer cuts were made without difficulty nor   notching making certain that I was along the anterior cortex to help   with flexion gap stability.      The final box cut was made off the lateral aspect of distal femur.      At this point, the tibia was sized to be a size 3, the size 3 tray was   then pinned in position through the medial third of the tubercle,   drilled, and keel punched.  Trial reduction was now carried with a 3 femur,  3 tibia, a 10 then 12.5 mm insert, and the 38 patella botton.  The knee was brought to   extension, full extension with good flexion stability with the patella   tracking through the trochlea without application of pressure.  Given   all these findings, the trial components removed.  Final components were   opened and cement was mixed.  The knee was irrigated with normal saline   solution and pulse lavage.  The synovial lining was   then injected with 20cc of Exparel, 30cc of 0.25% Marcaine with epinephrine and 1 cc of Toradol,   total of 61 cc.      The knee was irrigated.  Final implants were then cemented onto clean and   dried cut surfaces of bone with the knee brought to extension with a 12.5 mm trial insert.      Once the cement had fully cured, the excess cement was removed   throughout the knee.  I confirmed I was satisfied with the range of   motion and stability, and the final 12.5 mm PS insert was chosen.  It was   placed into the knee.      The tourniquet had been let down at 31 minutes.  No significant   hemostasis required.  The   extensor mechanism was then reapproximated using #1 Vicryl and #0 V-lock sutures with the knee  in flexion.  The   remaining wound was closed with 2-0 Vicryl and running 4-0 Monocryl.   The knee was cleaned, dried, dressed sterilely using Dermabond and   Aquacel dressing. .  The patient was  then   brought to recovery room in stable condition, tolerating the procedure   well.   Please note that Physician Assistant, Danae Orleans, was present for the entirety of the case, and was utilized for pre-operative positioning, peri-operative retractor management, general facilitation of the procedure.  He was also utilized for primary wound closure at the end of the case.              Pietro Cassis Alvan Dame, M.D.    09/17/2013 10:47 PM

## 2013-09-17 NOTE — Anesthesia Postprocedure Evaluation (Signed)
  Anesthesia Post-op Note  Patient: [name] [name]  Procedure(s) Performed: Procedure(s) (LRB): LEFT TOTAL KNEE ARTHROPLASTY (Left)  Patient Location: PACU  Anesthesia Type: Spinal  Level of Consciousness: awake and alert   Airway and Oxygen Therapy: Patient Spontanous Breathing  Post-op Pain: mild  Post-op Assessment: Post-op Vital signs reviewed, Patient's Cardiovascular Status Stable, Respiratory Function Stable, Patent Airway and No signs of Nausea or vomiting  Last Vitals:  Filed Vitals:   09/17/13 1600  BP: 129/68  Pulse: 64  Temp:   Resp: 19    Post-op Vital Signs: stable   Complications: No apparent anesthesia complications

## 2013-09-17 NOTE — Progress Notes (Signed)
Pt arrived unit from PACU, alert and oriented, able to verbalized needs. VSS, received report from [place], [place] from PACU. WILL continue with current plan of care.

## 2013-09-17 NOTE — Anesthesia Preprocedure Evaluation (Addendum)
Anesthesia Evaluation  Patient identified by MRN, date of birth, ID band Patient awake    Reviewed: Allergy & Precautions, H&P , NPO status , Patient's Chart, lab work & pertinent test results  History of Anesthesia Complications Negative for: history of anesthetic complications  Airway Mallampati: II TM Distance: >3 FB Neck ROM: Full    Dental no notable dental hx.    Pulmonary pneumonia -, COPDformer smoker,  breath sounds clear to auscultation  Pulmonary exam normal       Cardiovascular hypertension, Pt. on medications Rhythm:Regular Rate:Normal     Neuro/Psych negative neurological ROS  negative psych ROS   GI/Hepatic negative GI ROS, Neg liver ROS,   Endo/Other  negative endocrine ROS  Renal/GU negative Renal ROS     Musculoskeletal  (+) Arthritis -, Osteoarthritis,    Abdominal   Peds  Hematology negative hematology ROS (+)   Anesthesia Other Findings   Reproductive/Obstetrics negative OB ROS                          Anesthesia Physical Anesthesia Plan  ASA: III  Anesthesia Plan: Spinal   Post-op Pain Management:    Induction:   Airway Management Planned:   Additional Equipment:   Intra-op Plan:   Post-operative Plan:   Informed Consent: I have reviewed the patients History and Physical, chart, labs and discussed the procedure including the risks, benefits and alternatives for the proposed anesthesia with the patient or authorized representative who has indicated his/her understanding and acceptance.   Dental advisory given  Plan Discussed with: CRNA  Anesthesia Plan Comments:        Anesthesia Quick Evaluation

## 2013-09-17 NOTE — Interval H&P Note (Signed)
History and Physical Interval Note:  09/17/2013 1:26 PM  [name] [name]  has presented today for surgery, with the diagnosis of left knee osteoarthritis  The various methods of treatment have been discussed with the patient and family. After consideration of risks, benefits and other options for treatment, the patient has consented to  Procedure(s): LEFT TOTAL KNEE ARTHROPLASTY (Left) as a surgical intervention .  The patient's history has been reviewed, patient examined, no change in status, stable for surgery.  I have reviewed the patient's chart and labs.  Questions were answered to the patient's satisfaction.     Mauri Pole

## 2013-09-18 ENCOUNTER — Encounter (HOSPITAL_COMMUNITY): Payer: Self-pay | Admitting: Orthopedic Surgery

## 2013-09-18 DIAGNOSIS — D500 Iron deficiency anemia secondary to blood loss (chronic): Secondary | ICD-10-CM | POA: Diagnosis not present

## 2013-09-18 LAB — CBC
HCT: 32 % — ABNORMAL LOW (ref 36.0–46.0)
Hemoglobin: 10.7 g/dL — ABNORMAL LOW (ref 12.0–15.0)
MCH: 32.4 pg (ref 26.0–34.0)
MCHC: 33.4 g/dL (ref 30.0–36.0)
MCV: 97 fL (ref 78.0–100.0)
PLATELETS: 154 10*3/uL (ref 150–400)
RBC: 3.3 MIL/uL — ABNORMAL LOW (ref 3.87–5.11)
RDW: 12.7 % (ref 11.5–15.5)
WBC: 6.5 10*3/uL (ref 4.0–10.5)

## 2013-09-18 LAB — BASIC METABOLIC PANEL
ANION GAP: 9 (ref 5–15)
BUN: 14 mg/dL (ref 6–23)
CO2: 24 mEq/L (ref 19–32)
Calcium: 8.5 mg/dL (ref 8.4–10.5)
Chloride: 104 mEq/L (ref 96–112)
Creatinine, Ser: 0.78 mg/dL (ref 0.50–1.10)
GFR, EST AFRICAN AMERICAN: 88 mL/min — AB (ref >90)
GFR, EST NON AFRICAN AMERICAN: 76 mL/min — AB (ref >90)
Glucose, Bld: 87 mg/dL (ref 70–99)
Potassium: 3.9 mEq/L (ref 3.7–5.3)
SODIUM: 137 meq/L (ref 137–147)

## 2013-09-18 MED ORDER — FERROUS SULFATE 325 (65 FE) MG PO TABS: 325.0000 mg | ORAL_TABLET | Freq: Three times a day (TID) | ORAL | Status: DC

## 2013-09-18 MED ORDER — POLYETHYLENE GLYCOL 3350 17 G PO PACK: 17.0000 g | PACK | Freq: Two times a day (BID) | ORAL | Status: DC | PRN

## 2013-09-18 MED ORDER — ACETAMINOPHEN 325 MG PO TABS: 325.0000 mg | ORAL_TABLET | Freq: Four times a day (QID) | ORAL | Status: DC | PRN

## 2013-09-18 MED ORDER — DSS 100 MG PO CAPS: 100.0000 mg | ORAL_CAPSULE | Freq: Two times a day (BID) | ORAL | Status: DC | PRN

## 2013-09-18 MED ORDER — TRAMADOL HCL 50 MG PO TABS: 50.0000 mg | ORAL_TABLET | Freq: Four times a day (QID) | ORAL | Status: DC | PRN

## 2013-09-18 MED ORDER — ACETAMINOPHEN 325 MG PO TABS
325.0000 mg | ORAL_TABLET | Freq: Four times a day (QID) | ORAL | Status: DC | PRN
  Administered 2013-09-18 (×2): 325 mg via ORAL
  Administered 2013-09-19: 650 mg via ORAL
  Filled 2013-09-18 (×2): qty 1
  Filled 2013-09-18: qty 2

## 2013-09-18 MED ORDER — ASPIRIN 325 MG PO TBEC: 325.0000 mg | DELAYED_RELEASE_TABLET | Freq: Two times a day (BID) | ORAL | Status: AC

## 2013-09-18 NOTE — Progress Notes (Signed)
Clinical Social Work Department BRIEF PSYCHOSOCIAL ASSESSMENT 09/18/2013  Patient:  [name] [name], [name] [name]     Account Number:  [id number]     Admit date:  09/17/2013  Clinical Social Worker:  Lacie Scotts  Date/Time:  09/18/2013 12:20 PM  Referred by:  Physician  Date Referred:  09/18/2013 Referred for  SNF Placement   Other Referral:   Interview type:  Patient Other interview type:    PSYCHOSOCIAL DATA Living Status:  ALONE Admitted from facility:   Level of care:   Primary support name:  Charlette Caffey Primary support relationship to patient:  FAMILY Degree of support available:   supportive    CURRENT CONCERNS Current Concerns  Post-Acute Placement   Other Concerns:    SOCIAL WORK ASSESSMENT / PLAN Pt is an 80 yr old female living at home prior to hospitalization. CSW met with pt to assist with d/c planning . This is a planned admission. Pt will most likely will need ST Rehab following hospital d/c. CSW has contacted SNF and D/C plan has been confirmed. CSW will continue to follow to assist with d/c planning to SNF.   Assessment/plan status:  Psychosocial Support/Ongoing Assessment of Needs Other assessment/ plan:   Information/referral to community resources:   Insurance coverage for SNF and ambulance transport reviewed.    PATIENT'S/FAMILY'S RESPONSE TO PLAN OF CARE: Pt's mood is bright. She had little sleep following surgery but pain is being controlled. Pt is looking forward to rehab at [facility].    Werner Lean LCSW [phone]

## 2013-09-18 NOTE — Progress Notes (Signed)
OT Cancellation Note  Patient Details Name: [name] [name] MRN: [id number] DOB: 1930/09/17   Cancelled Treatment:    Reason Eval/Treat Not Completed: Other (comment) Pt is Medicare/Medicaid and current D/C plan is SNF. No apparent immediate acute care OT needs, therefore will defer OT to SNF. If OT eval is needed please call Acute Rehab Dept. at [facility] 09/18/2013, 8:07 AM Lesle Chris, OTR/L [phone] 09/18/2013

## 2013-09-18 NOTE — Progress Notes (Signed)
A swish/ click noise noted when PT was bending the knee. PA called. No new orders given. PA stated they will follow up in the AM 09/19/13.

## 2013-09-18 NOTE — Discharge Summary (Signed)
Physician Discharge Summary  Patient ID: [name] [name] MRN: [id number] DOB/AGE: 04/05/1930 80 y.o.  Admit date: 09/17/2013 Discharge date:    09/19/2013  Procedures:  Procedure(s) (LRB): LEFT TOTAL KNEE ARTHROPLASTY (Left)  Attending Physician:  Dr. Paralee Cancel   Admission Diagnoses:   Left knee pain  Discharge Diagnoses:  Principal Problem:   S/P left TKA Active Problems:   Expected blood loss anemia  Past Medical History  Diagnosis Date  . DEGENERATIVE JOINT DISEASE, KNEE   . GENU VALGUM   . 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 ROTATOR CUFF SYNDROME, LEFT     CANNOT LIFT LEFT ARM ALL THE WAY UP  . UNEQUAL LEG LENGTH   . Bradycardia     asymptomatic  . PVC's (premature ventricular contractions)   . Hypertension   . Hyperlipidemia   . COPD (chronic obstructive pulmonary disease)     MILD, NO INHALERS USED  . Pneumonia 2013    X 2  . Anxiety   . UTI (urinary tract infection)     STARTED AUGMENTIN  ON 09-10-13  . Breast CA 1993 OR 1994    LEFT, SURGERY AND RADIATION DONE  . Family history of anesthesia complication     NEPHEW NAUSEA/VOMITING  . Numbness of leg 2011    just left shin    HPI: [name] [name], 80 y.o. female, has a history of pain and functional disability in the left knee due to arthritis and has failed non-surgical conservative treatments for greater than 12 weeks to include NSAID's and/or analgesics, corticosteriod injections and activity modification. Onset of symptoms was gradual, starting 2+ years ago with gradually worsening course since that time. The patient noted no past surgery on the left knee(s). Patient currently rates pain in the left knee(s) at 5 out of 10 with activity. Patient has worsening of pain with activity and weight bearing, pain that interferes with activities of daily living, pain with passive range of motion, crepitus and joint swelling. Patient has evidence of periarticular osteophytes and joint space  narrowing by imaging studies. There is no active infection. Risks, benefits and expectations were discussed with the patient. Risks including but not limited to the risk of anesthesia, blood clots, nerve damage, blood vessel damage, failure of the prosthesis, infection and up to and including death. Patient understand the risks, benefits and expectations and wishes to proceed with surgery.  PCP: Jerlyn Ly, MD   Discharged Condition: good  Hospital Course:  Patient underwent the above stated procedure on 09/17/2013. Patient tolerated the procedure well and brought to the recovery room in good condition and subsequently to the floor.  POD #1 BP: 130/59 ; Pulse: 51 ; Temp: 98.3 F (36.8 C) ; Resp: 16  Patient reports pain as mild, pain controlled. No events throughout the night. Still planning on SNF upon discharge. Dorsiflexion/plantar flexion intact, incision: dressing C/D/I, no cellulitis present and compartment soft.   LABS  Basename    HGB  10.7  HCT  32.0   POD #2  BP: 123/60 ; Pulse: 74 ; Temp: 98.6 F (37 C) ; Resp: 16  Patient reports pain as mild, controlled with APAP. No events throughout the night. Ready to be discharged to skilled nursing facility. Dorsiflexion/plantar flexion intact, incision: dressing C/D/I, no cellulitis present and compartment soft.   LABS  Basename    HGB  10.8  HCT  32.2    Discharge Exam: General appearance: alert, cooperative and no distress Extremities:  Homans sign is negative, no sign of DVT, no edema, redness or tenderness in the calves or thighs and no ulcers, gangrene or trophic changes  Disposition:     Skilled nursing facility with follow up in 2 weeks   Follow-up Information   Follow up with Mauri Pole, MD. Schedule an appointment as soon as possible for a visit in 2 weeks.   Specialty:  Orthopedic Surgery   Contact information:   [address] [place] [address] [phone]       Discharge  Instructions   Call MD / Call 911    Complete by:  As directed   If you experience chest pain or shortness of breath, CALL 911 and be transported to the hospital emergency room.  If you develope a fever above 101 F, pus (white drainage) or increased drainage or redness at the wound, or calf pain, call your surgeon's office.     Change dressing    Complete by:  As directed   Maintain surgical dressing for 10-14 days, or until follow up in the clinic.     Constipation Prevention    Complete by:  As directed   Drink plenty of fluids.  Prune juice may be helpful.  You may use a stool softener, such as Colace (over the counter) 100 mg twice a day.  Use MiraLax (over the counter) for constipation as needed.     Diet - low sodium heart healthy    Complete by:  As directed      Discharge instructions    Complete by:  As directed   Maintain surgical dressing for 10-14 days, or until follow up in the clinic. Follow up in 2 weeks at [facility]. Call with any questions or concerns.     Increase activity slowly as tolerated    Complete by:  As directed      TED hose    Complete by:  As directed   Use stockings (TED hose) for 2 weeks on both leg(s).  You may remove them at night for sleeping.     Weight bearing as tolerated    Complete by:  As directed   Laterality:  left  Extremity:  Lower             Medication List    STOP taking these medications       amoxicillin-clavulanate 500-125 MG per tablet  Commonly known as:  AUGMENTIN     BONIVA PO     CHEWABLE IRON PO      TAKE these medications       acetaminophen 325 MG tablet  Commonly known as:  TYLENOL  Take 1-2 tablets (325-650 mg total) by mouth every 6 (six) hours as needed for mild pain or moderate pain.     aspirin 325 MG EC tablet  Take 1 tablet (325 mg total) by mouth 2 (two) times daily.     atorvastatin 20 MG tablet  Commonly known as:  LIPITOR  Take 20 mg by mouth every evening.     DSS 100 MG Caps    Take 100 mg by mouth 2 (two) times daily as needed for mild constipation.     escitalopram 10 MG tablet  Commonly known as:  LEXAPRO  Take 10 mg by mouth every morning.     ferrous sulfate 325 (65 FE) MG tablet  Take 1 tablet (325 mg total) by mouth 3 (three) times daily after meals.     lisinopril 20  MG tablet  Commonly known as:  PRINIVIL,ZESTRIL  Take 20 mg by mouth every morning.     LOMOTIL 2.5-0.025 MG per tablet  Generic drug:  diphenoxylate-atropine  Take 1 tablet by mouth 4 (four) times daily as needed for diarrhea or loose stools.     metoprolol succinate 50 MG 24 hr tablet  Commonly known as:  TOPROL-XL  Take 50 mg by mouth every morning. Take with or immediately following a meal.     multivitamin with minerals Tabs tablet  Take 1 tablet by mouth daily.     polyethylene glycol packet  Commonly known as:  MIRALAX / GLYCOLAX  Take 17 g by mouth 2 (two) times daily as needed.     THERA TEARS ALLERGY 0.025 % ophthalmic solution  Generic drug:  ketotifen  Place 1 drop into both eyes 2 (two) times daily as needed (Dry eyes).     traMADol 50 MG tablet  Commonly known as:  ULTRAM  Take 1-2 tablets (50-100 mg total) by mouth every 6 (six) hours as needed.     Vitamin D (Ergocalciferol) 50000 UNITS Caps capsule  Commonly known as:  DRISDOL  Take 50,000 Units by mouth every 7 (seven) days. Mondays         Signed: [place] Pugh. Anniebell Bedore   PA-C  09/18/2013, 8:58 PM

## 2013-09-18 NOTE — Evaluation (Signed)
Physical Therapy Evaluation Patient Details Name: [name] [name] MRN: [id number] DOB: [birth year] Today's Date: 09/18/2013   History of Present Illness  L TKA, h/o B THA  Clinical Impression  **Pt is s/p TKA resulting in the deficits listed below (see PT Problem List). ** Pt will benefit from skilled PT to increase their independence and safety with mobility to allow discharge to the venue listed below.   Pt ambulated 29' with RW with minimal pain and performed TKA exercises with min A. She is independent with SLR, L knee ROM 15-85*. Overall good mobility. Activity tolerance limited by dizziness. VSS. Pt would like to try decreasing pain medication, RN notified. Excellent progress expected. ** *     SNF    Equipment Recommendations  None recommended by PT    Recommendations for Other Services OT consult     Precautions / Restrictions Precautions Precautions: Knee Restrictions Weight Bearing Restrictions: No Other Position/Activity Restrictions: WBAT      Mobility  Bed Mobility Overal bed mobility: Needs Assistance Bed Mobility: Supine to Sit     Supine to sit: HOB elevated     General bed mobility comments: min A to support LLE  Transfers Overall transfer level: Needs assistance Equipment used: Rolling walker (2 wheeled) Transfers: Sit to/from Stand Sit to Stand: Min guard         General transfer comment: min/guard for safety  Ambulation/Gait   Ambulation Distance (Feet): 35 Feet Assistive device: Rolling walker (2 wheeled) Gait Pattern/deviations: Step-to pattern   Gait velocity interpretation: Below normal speed for age/gender General Gait Details: distance limited by dizziness, VSS  Stairs            Wheelchair Mobility    Modified Rankin (Stroke Patients Only)       Balance                                             Pertinent Vitals/Pain Pain Assessment: 0-10 Pain Score: 3  Pain Location: L knee with  walking Pain Descriptors / Indicators: Sore Pain Intervention(s): Monitored during session;Premedicated before session;Ice applied  BP 124/69 sitting with reported dizziness SaO2 96% on RA HR 57    Home Living Family/patient expects to be discharged to:: Skilled nursing facility Living Arrangements: Alone     Home Access: Stairs to enter   Entrance Stairs-Number of Steps: 2 Home Layout: One level Home Equipment: Walker - 2 wheels;Cane - single point;Bedside commode      Prior Function Level of Independence: Independent               Hand Dominance        Extremity/Trunk Assessment   Upper Extremity Assessment: LUE deficits/detail       LUE Deficits / Details: "bone on bone" in L shoulder, limited elevation   Lower Extremity Assessment: LLE deficits/detail   LLE Deficits / Details: knee flexion AAROM 85*, extension -15* AROM in sitting; SLR 3/5  Cervical / Trunk Assessment: Normal  Communication   Communication: No difficulties  Cognition Arousal/Alertness: Awake/alert Behavior During Therapy: WFL for tasks assessed/performed Overall Cognitive Status: Within Functional Limits for tasks assessed                      General Comments      Exercises Total Joint Exercises Ankle Circles/Pumps: AROM;Both;15 reps;Supine Quad Sets: AROM;Left;15 reps;Supine Short Arc Quad:  AROM;Left;10 reps;Supine Heel Slides: AAROM;Left;15 reps;Supine Long Arc Quad: AROM;Left;5 reps;Seated Goniometric ROM: 85* flexion AAROM, -15* AROM extension L knee      Assessment/Plan    PT Assessment Patient needs continued PT services  PT Diagnosis Difficulty walking;Acute pain   PT Problem List Decreased strength;Decreased range of motion;Decreased activity tolerance;Decreased mobility  PT Treatment Interventions Gait training;DME instruction;Therapeutic exercise;Therapeutic activities;Patient/family education;Neuromuscular re-education;Functional mobility training   PT  Goals (Current goals can be found in the Care Plan section) Acute Rehab PT Goals Patient Stated Goal: return to active, independent livestyle, does strength training with a trainer PT Goal Formulation: With patient Time For Goal Achievement: 10/02/13 Potential to Achieve Goals: Good    Frequency 7X/week   Barriers to discharge        Co-evaluation               End of Session Equipment Utilized During Treatment: Gait belt Activity Tolerance: Patient tolerated treatment well Patient left: in chair;with call bell/phone within reach Nurse Communication: Mobility status         Time: 3220-2542 PT Time Calculation (min): 47 min   Charges:   PT Evaluation $Initial PT Evaluation Tier I: 1 Procedure PT Treatments $Gait Training: 8-22 mins $Therapeutic Exercise: 8-22 mins   PT G Codes:          Philomena Doheny 09/18/2013, 9:36 AM [phone]

## 2013-09-18 NOTE — Progress Notes (Signed)
Clinical Social Work Department CLINICAL SOCIAL WORK PLACEMENT NOTE 09/18/2013  Patient:  [name] [name], [name] [name]  Account Number:  [id number] Admit date:  09/17/2013  Clinical Social Worker:  Werner Lean, LCSW  Date/time:  09/18/2013 01:18 PM  Clinical Social Work is seeking post-discharge placement for this patient at the following level of care:   SKILLED NURSING   (*CSW will update this form in Epic as items are completed)     Patient/family provided with [facility] Department of Clinical Social Work's list of facilities offering this level of care within the geographic area requested by the patient (or if unable, by the patient's family).  09/18/2013  Patient/family informed of their freedom to choose among providers that offer the needed level of care, that participate in Medicare, Medicaid or managed care program needed by the patient, have an available bed and are willing to accept the patient.    Patient/family informed of [facility]' ownership interest in [facility], as well as of the fact that they are under no obligation to receive care at this facility.  PASARR submitted to EDS on 09/18/2013 PASARR number received on 09/18/2013  FL2 transmitted to all facilities in geographic area requested by pt/family on  09/18/2013 FL2 transmitted to all facilities within larger geographic area on   Patient informed that his/her managed care company has contracts with or will negotiate with  certain facilities, including the following:     Patient/family informed of bed offers received:  09/18/2013 Patient chooses bed at [place] Physician recommends and patient chooses bed at    Patient to be transferred to  on   Patient to be transferred to facility by  Patient and family notified of transfer on  Name of family member notified:    The following physician request were entered in Epic:   Additional Comments:  Werner Lean LCSW [phone]

## 2013-09-18 NOTE — Progress Notes (Signed)
Physical Therapy Treatment Patient Details Name: [name] [name] MRN: [id number] DOB: 03/13/1930 Today's Date: 09/18/2013    History of Present Illness L TKA, h/o B THA    PT Comments    *With knee flexion exercises audible squishing/slurping sound was noted coming from L knee. RN Amy notified, she paged MD who stated this was likely excess fluid behind patella and is not concerning. Pt is making excellent progress with mobility. She walked 37' with RW with supervision and completed TKA exercises without assist. She is independent with SLR. Pt is highly motivated and puts forth good effort.  **  Follow Up Recommendations  SNF     Equipment Recommendations  None recommended by PT    Recommendations for Other Services OT consult     Precautions / Restrictions Precautions Precautions: Knee Restrictions Weight Bearing Restrictions: No Other Position/Activity Restrictions: WBAT    Mobility  Bed Mobility Overal bed mobility: Needs Assistance Bed Mobility: Supine to Sit     Supine to sit: HOB elevated     General bed mobility comments: up in chair  Transfers Overall transfer level: Needs assistance Equipment used: Rolling walker (2 wheeled) Transfers: Sit to/from Stand Sit to Stand: Supervision         General transfer comment: cues for hand placement  Ambulation/Gait Ambulation/Gait assistance: Supervision Ambulation Distance (Feet): 155 Feet Assistive device: Rolling walker (2 wheeled) Gait Pattern/deviations: Step-to pattern Gait velocity: WFL Gait velocity interpretation: Below normal speed for age/gender General Gait Details: supervision for safety, cues for posture   Stairs            Wheelchair Mobility    Modified Rankin (Stroke Patients Only)       Balance                                    Cognition Arousal/Alertness: Awake/alert Behavior During Therapy: WFL for tasks assessed/performed Overall Cognitive Status:  Within Functional Limits for tasks assessed                      Exercises Total Joint Exercises Ankle Circles/Pumps: AROM;Both;15 reps;Supine Quad Sets: AROM;Left;15 reps;Supine Short Arc Quad: AROM;Left;Supine;20 reps Heel Slides: AAROM;Left;15 reps;Supine Hip ABduction/ADduction: AROM;Left;10 reps;Supine Straight Leg Raises: AROM;Left;10 reps;Supine Long Arc Quad: AROM;Left;Seated;15 reps Goniometric ROM: 85* flexion AAROM, -15* extension AROM L knee    General Comments        Pertinent Vitals/Pain Pain Assessment: 0-10 Pain Score: 3  Pain Location: L knee with walking Pain Descriptors / Indicators: Sore Pain Intervention(s): Monitored during session;Premedicated before session;Ice applied    Home Living Family/patient expects to be discharged to:: Skilled nursing facility Living Arrangements: Alone     Home Access: Stairs to enter   Home Layout: One level Home Equipment: Walker - 2 wheels;Cane - single point;Bedside commode      Prior Function Level of Independence: Independent          PT Goals (current goals can now be found in the care plan section) Acute Rehab PT Goals Patient Stated Goal: return to active, independent livestyle, does strength training with a trainer PT Goal Formulation: With patient Time For Goal Achievement: 10/02/13 Potential to Achieve Goals: Good Progress towards PT goals: Progressing toward goals    Frequency  7X/week    PT Plan Current plan remains appropriate    Co-evaluation  End of Session Equipment Utilized During Treatment: Gait belt Activity Tolerance: Patient tolerated treatment well Patient left: in chair;with call bell/phone within reach     Time: 1128-1208 PT Time Calculation (min): 40 min  Charges:  $Gait Training: 8-22 mins $Therapeutic Exercise: 8-22 mins $Therapeutic Activity: 8-22 mins                    G Codes:      Philomena Doheny 09/18/2013, 12:19  PM [phone]

## 2013-09-18 NOTE — Care Management Note (Signed)
    Page 1 of 1   09/18/2013     2:38:12 PM CARE MANAGEMENT NOTE 09/18/2013  Patient:  [name] [name], [name] [name]   Account Number:  [id number]  Date Initiated:  09/18/2013  Documentation initiated by:  [facility]  Subjective/Objective Assessment:   YQM:GNOI TOTAL KNEE ARTHROPLASTY (Left)     Action/Plan:   followed by Arville Go but SNF for rehab   Anticipated DC Date:  09/18/2013   Anticipated DC Plan:  [place]  CM consult      Choice offered to / List presented to:             Status of service:  Completed, signed off Medicare Important Message given?  NA - LOS <3 / Initial given by admissions (If response is "NO", the following Medicare IM given date fields will be blank) Date Medicare IM given:   Medicare IM given by:   Date Additional Medicare IM given:   Additional Medicare IM given by:    Discharge Disposition:  [place]  Per UR Regulation:  Reviewed for med. necessity/level of care/duration of stay  If discussed at [place] of Stay Meetings, dates discussed:    Comments:  09/18/13 14:30 CM notes though pt followed by Arville Go, recc for SNF; CSW arranging for SNF dc today.  No other CM needs were communicated.  Mariane Masters, BSN, CM [phone].

## 2013-09-18 NOTE — Progress Notes (Signed)
     Subjective: 1 Day Post-Op Procedure(s) (LRB): LEFT TOTAL KNEE ARTHROPLASTY (Left)   Patient reports pain as mild, pain controlled. No events throughout the night. Still planning on SNF upon discharge.  Objective:   VITALS:   Filed Vitals:   09/18/13 0450  BP: 130/59  Pulse: 51  Temp: 98.3 F (36.8 C)  Resp: 16    Dorsiflexion/Plantar flexion intact Incision: dressing C/D/I No cellulitis present Compartment soft  LABS  Recent Labs  09/18/13 0435  HGB 10.7*  HCT 32.0*  WBC 6.5  PLT 154     Recent Labs  09/18/13 0435  NA 137  K 3.9  BUN 14  CREATININE 0.78  GLUCOSE 87     Assessment/Plan: 1 Day Post-Op Procedure(s) (LRB): LEFT TOTAL KNEE ARTHROPLASTY (Left) Foley cath d/c'ed Advance diet Up with therapy D/C IV fluids Discharge to SNF eventually, when ready  Expected ABLA  Treated with iron and will observe      West Pugh. Anniebell Bedore   PAC  09/18/2013, 8:27 AM

## 2013-09-19 DIAGNOSIS — Z96659 Presence of unspecified artificial knee joint: Secondary | ICD-10-CM | POA: Diagnosis not present

## 2013-09-19 DIAGNOSIS — F3289 Other specified depressive episodes: Secondary | ICD-10-CM | POA: Diagnosis not present

## 2013-09-19 DIAGNOSIS — I10 Essential (primary) hypertension: Secondary | ICD-10-CM | POA: Diagnosis not present

## 2013-09-19 DIAGNOSIS — M25569 Pain in unspecified knee: Secondary | ICD-10-CM | POA: Diagnosis not present

## 2013-09-19 DIAGNOSIS — M1710 Unilateral primary osteoarthritis, unspecified knee: Secondary | ICD-10-CM | POA: Diagnosis not present

## 2013-09-19 DIAGNOSIS — E785 Hyperlipidemia, unspecified: Secondary | ICD-10-CM | POA: Diagnosis not present

## 2013-09-19 DIAGNOSIS — F329 Major depressive disorder, single episode, unspecified: Secondary | ICD-10-CM | POA: Diagnosis not present

## 2013-09-19 DIAGNOSIS — D649 Anemia, unspecified: Secondary | ICD-10-CM | POA: Diagnosis not present

## 2013-09-19 DIAGNOSIS — M6281 Muscle weakness (generalized): Secondary | ICD-10-CM | POA: Diagnosis not present

## 2013-09-19 DIAGNOSIS — IMO0002 Reserved for concepts with insufficient information to code with codable children: Secondary | ICD-10-CM | POA: Diagnosis not present

## 2013-09-19 DIAGNOSIS — D500 Iron deficiency anemia secondary to blood loss (chronic): Secondary | ICD-10-CM | POA: Diagnosis not present

## 2013-09-19 DIAGNOSIS — Z471 Aftercare following joint replacement surgery: Secondary | ICD-10-CM | POA: Diagnosis not present

## 2013-09-19 DIAGNOSIS — J449 Chronic obstructive pulmonary disease, unspecified: Secondary | ICD-10-CM | POA: Diagnosis not present

## 2013-09-19 LAB — CBC
HCT: 32.2 % — ABNORMAL LOW (ref 36.0–46.0)
Hemoglobin: 10.8 g/dL — ABNORMAL LOW (ref 12.0–15.0)
MCH: 32.7 pg (ref 26.0–34.0)
MCHC: 33.5 g/dL (ref 30.0–36.0)
MCV: 97.6 fL (ref 78.0–100.0)
PLATELETS: 165 10*3/uL (ref 150–400)
RBC: 3.3 MIL/uL — ABNORMAL LOW (ref 3.87–5.11)
RDW: 12.7 % (ref 11.5–15.5)
WBC: 8.9 10*3/uL (ref 4.0–10.5)

## 2013-09-19 LAB — BASIC METABOLIC PANEL
ANION GAP: 9 (ref 5–15)
BUN: 11 mg/dL (ref 6–23)
CALCIUM: 8.6 mg/dL (ref 8.4–10.5)
CHLORIDE: 106 meq/L (ref 96–112)
CO2: 24 mEq/L (ref 19–32)
Creatinine, Ser: 0.68 mg/dL (ref 0.50–1.10)
GFR calc Af Amer: >90 mL/min (ref >90)
GFR calc non Af Amer: 80 mL/min — ABNORMAL LOW (ref >90)
Glucose, Bld: 116 mg/dL — ABNORMAL HIGH (ref 70–99)
Potassium: 4.4 mEq/L (ref 3.7–5.3)
SODIUM: 139 meq/L (ref 137–147)

## 2013-09-19 NOTE — Progress Notes (Signed)
     Subjective: 2 Days Post-Op Procedure(s) (LRB): LEFT TOTAL KNEE ARTHROPLASTY (Left)   Patient reports pain as mild, controlled with APAP. No events throughout the night. Ready to be discharged to skilled nursing facility.  Objective:   VITALS:   Filed Vitals:   09/19/13 0638  BP: 123/60  Pulse: 74  Temp: 98.6 F (37 C)  Resp: 16    Dorsiflexion/Plantar flexion intact Incision: dressing C/D/I No cellulitis present Compartment soft  LABS  Recent Labs  09/18/13 0435 09/19/13 0453  HGB 10.7* 10.8*  HCT 32.0* 32.2*  WBC 6.5 8.9  PLT 154 165     Recent Labs  09/18/13 0435 09/19/13 0453  NA 137 139  K 3.9 4.4  BUN 14 11  CREATININE 0.78 0.68  GLUCOSE 87 116*     Assessment/Plan: 2 Days Post-Op Procedure(s) (LRB): LEFT TOTAL KNEE ARTHROPLASTY (Left) Up with therapy Discharge to SNF Follow up in 2 weeks at [facility]. Follow up with OLIN,Keevon Henney D in 2 weeks.  Contact information:  [facility] [address] [place] [phone]    Expected ABLA  Treated with iron and will observe    West Pugh. Bryten Maher   PAC  09/19/2013, 8:49 AM

## 2013-09-19 NOTE — Progress Notes (Signed)
Physical Therapy Treatment Patient Details Name: [name] [name] MRN: [id number] DOB: 1930-12-28 Today's Date: 09/19/2013    History of Present Illness L TKA, h/o B THA    PT Comments    Pt  Progressing very well. For rehab today.  Follow Up Recommendations  SNF     Equipment Recommendations  None recommended by PT    Recommendations for Other Services       Precautions / Restrictions Precautions Precautions: Knee Restrictions Weight Bearing Restrictions: No    Mobility  Bed Mobility                  Transfers Overall transfer level: Needs assistance Equipment used: Rolling walker (2 wheeled) Transfers: Sit to/from Stand Sit to Stand: Supervision         General transfer comment: cues for hand placement  Ambulation/Gait Ambulation/Gait assistance: Supervision Ambulation Distance (Feet): 250 Feet Assistive device: Rolling walker (2 wheeled) Gait Pattern/deviations: Step-through pattern Gait velocity: WFL   General Gait Details: supervision for safety, cues for posture, try to heel strike   Stairs            Wheelchair Mobility    Modified Rankin (Stroke Patients Only)       Balance                                    Cognition Arousal/Alertness: Awake/alert                          Exercises Total Joint Exercises Ankle Circles/Pumps: AROM;Both;15 reps;Supine Quad Sets: AROM;Left;15 reps;Supine;Both Straight Leg Raises: AROM;Left;10 reps;Seated Long Arc Quad: AROM;Left;Seated;15 reps Knee Flexion: AROM;Left;10 reps;Seated Goniometric ROM: 10-90 A ROM L Knee    General Comments        Pertinent Vitals/Pain Pain Assessment: No/denies pain    Home Living                      Prior Function            PT Goals (current goals can now be found in the care plan section) Progress towards PT goals: Progressing toward goals    Frequency  7X/week    PT Plan Current plan remains  appropriate    Co-evaluation             End of Session           Time: 4580-9983 PT Time Calculation (min): 30 min  Charges:  $Gait Training: 8-22 mins $Therapeutic Exercise: 8-22 mins                    G Codes:      Marcelino Freestone PT [phone]  09/19/2013, 10:51 AM

## 2013-09-20 ENCOUNTER — Encounter: Payer: Self-pay | Admitting: Adult Health

## 2013-09-20 ENCOUNTER — Other Ambulatory Visit: Payer: Self-pay | Admitting: *Deleted

## 2013-09-20 ENCOUNTER — Non-Acute Institutional Stay (SKILLED_NURSING_FACILITY): Payer: Medicare Other | Admitting: Adult Health

## 2013-09-20 DIAGNOSIS — F329 Major depressive disorder, single episode, unspecified: Secondary | ICD-10-CM | POA: Diagnosis not present

## 2013-09-20 DIAGNOSIS — K5901 Slow transit constipation: Secondary | ICD-10-CM | POA: Insufficient documentation

## 2013-09-20 DIAGNOSIS — D500 Iron deficiency anemia secondary to blood loss (chronic): Secondary | ICD-10-CM | POA: Diagnosis not present

## 2013-09-20 DIAGNOSIS — I10 Essential (primary) hypertension: Secondary | ICD-10-CM | POA: Diagnosis not present

## 2013-09-20 DIAGNOSIS — F3289 Other specified depressive episodes: Secondary | ICD-10-CM

## 2013-09-20 DIAGNOSIS — IMO0002 Reserved for concepts with insufficient information to code with codable children: Secondary | ICD-10-CM | POA: Diagnosis not present

## 2013-09-20 DIAGNOSIS — M1710 Unilateral primary osteoarthritis, unspecified knee: Secondary | ICD-10-CM | POA: Diagnosis not present

## 2013-09-20 DIAGNOSIS — F32A Depression, unspecified: Secondary | ICD-10-CM

## 2013-09-20 DIAGNOSIS — F339 Major depressive disorder, recurrent, unspecified: Secondary | ICD-10-CM | POA: Insufficient documentation

## 2013-09-20 DIAGNOSIS — K5900 Constipation, unspecified: Secondary | ICD-10-CM

## 2013-09-20 MED ORDER — DIPHENOXYLATE-ATROPINE 2.5-0.025 MG PO TABS: 1.0000 | ORAL_TABLET | Freq: Four times a day (QID) | ORAL | Status: DC | PRN

## 2013-09-20 NOTE — Progress Notes (Signed)
Patient ID: [name] [name], female   DOB: 11-03-1930, 80 y.o.   MRN: [id number]              PROGRESS NOTE  DATE: 09/20/2013  FACILITY: Nursing Home Location: [facility]  LEVEL OF CARE: SNF (31)  Acute Visit  CHIEF COMPLAINT:  Follow-up Hospitalization  HISTORY OF PRESENT ILLNESS: This is an 80 year old female who has been admitted to [facility] on 09/19/13 from [facility] with DJD status post left total knee arthroplasty. She has been admitted for a short-term rehabilitation.     REASSESSMENT OF ONGOING PROBLEM(S):  HTN: Pt 's HTN remains stable.  Denies CP, sob, DOE, pedal edema, headaches, dizziness or visual disturbances.  No complications from the medications currently being used.  Last BP : 119/73  ANEMIA: The anemia has been stable. The patient denies fatigue, melena or hematochezia. No complications from the medications currently being used. 9/15 hgb 10.8  DEPRESSION: The depression remains stable. Patient denies ongoing feelings of sadness, insomnia, anedhonia or lack of appetite. No complications reported from the medications currently being used. Staff do not report behavioral problems.  PAST MEDICAL HISTORY : Reviewed.  No changes/see problem list  CURRENT MEDICATIONS: Reviewed per MAR/see medication list  REVIEW OF SYSTEMS:  GENERAL: no change in appetite, no fatigue, no weight changes, no fever, chills or weakness RESPIRATORY: no cough, SOB, DOE, wheezing, hemoptysis CARDIAC: no chest pain, edema or palpitations GI: no abdominal pain, diarrhea, constipation, heart burn, nausea or vomiting  PHYSICAL EXAMINATION  GENERAL: no acute distress, normal body habitus SKIN:  Left knee surgical wound is dry EYES: conjunctivae normal, sclerae normal, normal eye lids NECK: supple, trachea midline, no neck masses, no thyroid tenderness, no thyromegaly LYMPHATICS: no LAN in the neck, no supraclavicular LAN RESPIRATORY: breathing is even &  unlabored, BS CTAB CARDIAC: RRR, no murmur,no extra heart sounds, no edema GI: abdomen soft, normal BS, no masses, no tenderness, no hepatomegaly, no splenomegaly EXTREMITIES:  Able to move all 4 extremities; walks with walker PSYCHIATRIC: the patient is alert & oriented to person, affect & behavior appropriate  LABS/RADIOLOGY: Labs reviewed: Basic Metabolic Panel:  Recent Labs  09/11/13 1150 09/18/13 0435 09/19/13 0453  NA 135* 137 139  K 4.7 3.9 4.4  CL 98 104 106  CO2 26 24 24   GLUCOSE 91 87 116*  BUN 17 14 11   CREATININE 0.81 0.78 0.68  CALCIUM 9.5 8.5 8.6   CBC:  Recent Labs  09/11/13 1150 09/18/13 0435 09/19/13 0453  WBC 6.0 6.5 8.9  HGB 13.7 10.7* 10.8*  HCT 40.7 32.0* 32.2*  MCV 96.4 97.0 97.6  PLT 194 154 165   EXAM: CHEST  2 VIEW   COMPARISON:  05/17/2006   FINDINGS: Cardiomediastinal silhouette is stable. Mild mid thoracic dextroscoliosis. Mild lower thoracic levoscoliosis. Degenerative changes bilateral shoulders. No acute infiltrate or pulmonary edema. Hyperinflation again noted. Mild degenerative changes thoracic spine.   IMPRESSION: No active cardiopulmonary disease. Hyperinflation. Degenerative changes. Mild S-shaped thoracic scoliosis.   ASSESSMENT/PLAN:  DJD status post left total knee arthroplasty - for rehabilitation  Anemia, acute blood loss -  continue ferrous sulfate Hyperlipidemia - continue Lipitor Depression - continue Lexapro Hypertension - well controlled; continue lisinopril Constipation - stable; continue MiraLax   CPT CODE: 74081  Seth Bake - NP [facility] [phone]

## 2013-09-20 NOTE — Telephone Encounter (Signed)
[facility] 

## 2013-09-24 ENCOUNTER — Non-Acute Institutional Stay (SKILLED_NURSING_FACILITY): Payer: Medicare Other | Admitting: Internal Medicine

## 2013-09-24 DIAGNOSIS — D500 Iron deficiency anemia secondary to blood loss (chronic): Secondary | ICD-10-CM | POA: Diagnosis not present

## 2013-09-24 DIAGNOSIS — IMO0002 Reserved for concepts with insufficient information to code with codable children: Secondary | ICD-10-CM | POA: Diagnosis not present

## 2013-09-24 DIAGNOSIS — M1710 Unilateral primary osteoarthritis, unspecified knee: Secondary | ICD-10-CM

## 2013-09-24 DIAGNOSIS — J449 Chronic obstructive pulmonary disease, unspecified: Secondary | ICD-10-CM | POA: Diagnosis not present

## 2013-09-24 DIAGNOSIS — I10 Essential (primary) hypertension: Secondary | ICD-10-CM

## 2013-09-24 DIAGNOSIS — J4489 Other specified chronic obstructive pulmonary disease: Secondary | ICD-10-CM

## 2013-09-26 DIAGNOSIS — G8929 Other chronic pain: Secondary | ICD-10-CM | POA: Diagnosis not present

## 2013-09-26 DIAGNOSIS — J449 Chronic obstructive pulmonary disease, unspecified: Secondary | ICD-10-CM | POA: Insufficient documentation

## 2013-09-26 DIAGNOSIS — M25569 Pain in unspecified knee: Secondary | ICD-10-CM | POA: Diagnosis not present

## 2013-09-26 NOTE — Progress Notes (Signed)
HISTORY & PHYSICAL  DATE: 09/24/2013   FACILITY: [facility]  LEVEL OF CARE: SNF (31)  ALLERGIES:  Allergies  Allergen Reactions  . Cardizem [Diltiazem Hcl]     hypotension  . Other     PAIN MEDICATION CAUSES NAUSEA--? name    CHIEF COMPLAINT:  Manage left knee osteoarthritis, acute blood loss anemia and COPD  HISTORY OF PRESENT ILLNESS: Patient is an 80 year old Caucasian female.  KNEE OSTEOARTHRITIS: Patient had a history of pain and functional disability in the knee due to end-stage osteoarthritis and has failed nonsurgical conservative treatments. Patient had worsening of pain with activity and weight bearing, pain that interfered with activities of daily living & pain with passive range of motion. Therefore patient underwent total knee arthroplasty and tolerated the procedure well. Patient is admitted to this facility for sort short-term rehabilitation. Patient denies knee pain.  ANEMIA: The anemia has been stable. The patient denies fatigue, melena or hematochezia. No complications from the medications currently being used. Postoperatively the patient suffered acute blood loss. Last hemoglobins are 10.7 and 10.8.  COPD: the COPD remains stable.  Pt denies sob, cough, wheezing or declining exercise tolerance.  No complications from the medications presently being used.  PAST MEDICAL HISTORY :  Past Medical History  Diagnosis Date  . DEGENERATIVE JOINT DISEASE, KNEE   . GENU VALGUM   . 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 ROTATOR CUFF SYNDROME, LEFT     CANNOT LIFT LEFT ARM ALL THE WAY UP  . UNEQUAL LEG LENGTH   . Bradycardia     asymptomatic  . PVC's (premature ventricular contractions)   . Hypertension   . Hyperlipidemia   . COPD (chronic obstructive pulmonary disease)     MILD, NO INHALERS USED  . Pneumonia 2013    X 2  . Anxiety   . UTI (urinary tract infection)     STARTED AUGMENTIN  ON 09-10-13  . Breast CA 1993  OR 1994    LEFT, SURGERY AND RADIATION DONE  . Family history of anesthesia complication     NEPHEW NAUSEA/VOMITING  . Numbness of leg 2011    just left shin    PAST SURGICAL HISTORY: Past Surgical History  Procedure Laterality Date  . Total hip arthroplasty Bilateral LEFT 2008 AND RIGHT 2010  . Breast surgery Left 1993 OR 1994    LUMPECTOMY AND RADIATION DONE  . Appendectomy  AGE 42  . Nasal sinus surgery  2006  . Total knee arthroplasty Left 09/17/2013    Procedure: LEFT TOTAL KNEE ARTHROPLASTY;  Surgeon: Mauri Pole, MD;  Location: [facility];  Service: Orthopedics;  Laterality: Left;    SOCIAL HISTORY:  reports that she quit smoking about 21 years ago. Her smoking use included Cigarettes. She has a 3.75 pack-year smoking history. She has never used smokeless tobacco. She reports that she drinks alcohol. She reports that she does not use illicit drugs.  FAMILY HISTORY: None  CURRENT MEDICATIONS: Reviewed per MAR/see medication list  REVIEW OF SYSTEMS:  See HPI otherwise 14 point ROS is negative.  PHYSICAL EXAMINATION  VS:  See VS section  GENERAL: no acute distress, normal body habitus EYES: conjunctivae normal, sclerae normal, normal eye lids MOUTH/THROAT: lips without lesions,no lesions in the mouth,tongue is without lesions,uvula elevates in midline NECK: supple, trachea midline, no neck masses, no thyroid tenderness, no thyromegaly LYMPHATICS: no LAN in the neck, no supraclavicular LAN RESPIRATORY:  breathing is even & unlabored, BS CTAB CARDIAC: RRR, no murmur,no extra heart sounds, +2 lower extremity edema GI:  ABDOMEN: abdomen soft, normal BS, no masses, no tenderness  LIVER/SPLEEN: no hepatomegaly, no splenomegaly MUSCULOSKELETAL: HEAD: normal to inspection  EXTREMITIES: LEFT UPPER EXTREMITY: full range of motion, normal strength & tone RIGHT UPPER EXTREMITY:  full range of motion, normal strength & tone LEFT LOWER EXTREMITY:  range of motion not tested due to  surgery, normal strength & tone RIGHT LOWER EXTREMITY:  full range of motion, normal strength & tone PSYCHIATRIC: the patient is alert & oriented to person, affect & behavior appropriate  LABS/RADIOLOGY:  Labs reviewed: Basic Metabolic Panel:  Recent Labs  09/11/13 1150 09/18/13 0435 09/19/13 0453  NA 135* 137 139  K 4.7 3.9 4.4  CL 98 104 106  CO2 26 24 24   GLUCOSE 91 87 116*  BUN 17 14 11   CREATININE 0.81 0.78 0.68  CALCIUM 9.5 8.5 8.6   CBC:  Recent Labs  09/11/13 1150 09/18/13 0435 09/19/13 0453  WBC 6.0 6.5 8.9  HGB 13.7 10.7* 10.8*  HCT 40.7 32.0* 32.2*  MCV 96.4 97.0 97.6  PLT 194 154 165    CHEST  2 VIEW   COMPARISON:  05/17/2006   FINDINGS: Cardiomediastinal silhouette is stable. Mild mid thoracic dextroscoliosis. Mild lower thoracic levoscoliosis. Degenerative changes bilateral shoulders. No acute infiltrate or pulmonary edema. Hyperinflation again noted. Mild degenerative changes thoracic spine.   IMPRESSION: No active cardiopulmonary disease. Hyperinflation. Degenerative changes. Mild S-shaped thoracic scoliosis.    ASSESSMENT/PLAN:  Left knee osteoarthritis-status post total knee arthroplasty. The patient had completed in facility rehabilitation and is scheduled to be discharged today. Acute blood loss anemia-stable. Continue iron. COPD-compensated Hypertension-well-controlled Hyperlipidemia-continue Lipitor  I have reviewed patient's medical records received at admission/from hospitalization.  CPT CODE: 38937  Jianna Drabik Y Reshard Guillet, [place] [phone]

## 2013-09-27 ENCOUNTER — Telehealth: Payer: Self-pay | Admitting: Cardiology

## 2013-09-27 NOTE — Telephone Encounter (Signed)
New message      Pt took her bp 92/60 and pulse 60.  She had surgery on her knee 3 days ago.  She has not been drinking a lot of water.  Should she go back to taking 10mg  of lisinopril and not the toprol.  Last night she had a rapid heart beat.

## 2013-09-27 NOTE — Telephone Encounter (Signed)
Pt is aware.  

## 2013-09-27 NOTE — Telephone Encounter (Signed)
To Dr Turner to advise 

## 2013-09-27 NOTE — Telephone Encounter (Signed)
Please advise 

## 2013-09-27 NOTE — Telephone Encounter (Signed)
Take 1/2 tablet of Toprol today and hold Lisinopril.  Increase PO intake.  If BP>110/60 tomorrow then ok to go back to full dose Toprol and Lisinopril

## 2013-09-30 DIAGNOSIS — G8929 Other chronic pain: Secondary | ICD-10-CM | POA: Diagnosis not present

## 2013-09-30 DIAGNOSIS — M25569 Pain in unspecified knee: Secondary | ICD-10-CM | POA: Diagnosis not present

## 2013-10-01 ENCOUNTER — Telehealth: Payer: Self-pay | Admitting: Cardiology

## 2013-10-01 NOTE — Telephone Encounter (Signed)
TO Dr Turner to advise.  

## 2013-10-01 NOTE — Telephone Encounter (Signed)
Hold on restarting lisinopril for now

## 2013-10-01 NOTE — Telephone Encounter (Signed)
Pt is aware. Med list was updated.

## 2013-10-01 NOTE — Telephone Encounter (Signed)
New Message  Pt called to report she has taken her BP.She has readings for the pat two days   09/14 BP 114/81 pulse 72  09/15 BP 117/74 pulse 74   Pt states she was advised to stop the lisinopril on 09/12. Her pulse has slowed down. Pt states that she has taken metoprolol for the past 2 days at 100 mg and today she has began 50 mg of metoprolol. She has not restarted lisinopril. Pt is requesting a call back to determine when she should resume lisinopril. Please call back to discuss.

## 2013-10-02 DIAGNOSIS — M25569 Pain in unspecified knee: Secondary | ICD-10-CM | POA: Diagnosis not present

## 2013-10-02 DIAGNOSIS — G8929 Other chronic pain: Secondary | ICD-10-CM | POA: Diagnosis not present

## 2013-10-03 DIAGNOSIS — M25469 Effusion, unspecified knee: Secondary | ICD-10-CM | POA: Diagnosis not present

## 2013-10-03 DIAGNOSIS — Z96659 Presence of unspecified artificial knee joint: Secondary | ICD-10-CM | POA: Diagnosis not present

## 2013-10-04 ENCOUNTER — Telehealth: Payer: Self-pay | Admitting: Cardiology

## 2013-10-04 DIAGNOSIS — M25569 Pain in unspecified knee: Secondary | ICD-10-CM | POA: Diagnosis not present

## 2013-10-04 DIAGNOSIS — G8929 Other chronic pain: Secondary | ICD-10-CM | POA: Diagnosis not present

## 2013-10-04 NOTE — Telephone Encounter (Signed)
Pt is aware. She has PT today adn will see how she's feeling after

## 2013-10-04 NOTE — Telephone Encounter (Signed)
Pt had some swelling on cath sight. Dr Dr Sueanne Margarita gave pt muscle relaxer. She took it last night and she didn't sleep well. When she woke up she felt dizzy and then the BP was 80/67 P 64. Pt states she has had some dizziness still. But she feels a little better. Pt did not have this problem prior to taking the Tizanidine. She wanted you to be aware she still is only taking the metoprolol. We had discontinued her Lisinopril earlier this week. Pt has been drinking water and resting as well. Pt stated that the Tizanidine could cause Low blood pressure. Pt stated she will never take that medication ever again. To Dr Radford Pax to advise.

## 2013-10-04 NOTE — Telephone Encounter (Signed)
New problem:           Pt called about her BP went down very low got up this morning felt dizzy 80/57   10:05am.   TIZANIDINE 4mg  Dr Sueanne Margarita pescrid this.  Pt has questions about her medications please give her a call back.   Pt will be home until 12:30p

## 2013-10-04 NOTE — Telephone Encounter (Signed)
Please have patient see her PCP today 

## 2013-10-04 NOTE — Telephone Encounter (Signed)
Pt states when we call back it is ok to LVM

## 2013-10-07 DIAGNOSIS — G8929 Other chronic pain: Secondary | ICD-10-CM | POA: Diagnosis not present

## 2013-10-07 DIAGNOSIS — M25569 Pain in unspecified knee: Secondary | ICD-10-CM | POA: Diagnosis not present

## 2013-10-09 DIAGNOSIS — M25569 Pain in unspecified knee: Secondary | ICD-10-CM | POA: Diagnosis not present

## 2013-10-09 DIAGNOSIS — G8929 Other chronic pain: Secondary | ICD-10-CM | POA: Diagnosis not present

## 2013-10-11 DIAGNOSIS — M25569 Pain in unspecified knee: Secondary | ICD-10-CM | POA: Diagnosis not present

## 2013-10-11 DIAGNOSIS — G8929 Other chronic pain: Secondary | ICD-10-CM | POA: Diagnosis not present

## 2013-10-14 DIAGNOSIS — M25569 Pain in unspecified knee: Secondary | ICD-10-CM | POA: Diagnosis not present

## 2013-10-14 DIAGNOSIS — G8929 Other chronic pain: Secondary | ICD-10-CM | POA: Diagnosis not present

## 2013-10-16 DIAGNOSIS — M25569 Pain in unspecified knee: Secondary | ICD-10-CM | POA: Diagnosis not present

## 2013-10-18 DIAGNOSIS — G8929 Other chronic pain: Secondary | ICD-10-CM | POA: Diagnosis not present

## 2013-10-18 DIAGNOSIS — M25562 Pain in left knee: Secondary | ICD-10-CM | POA: Diagnosis not present

## 2013-10-21 DIAGNOSIS — M25562 Pain in left knee: Secondary | ICD-10-CM | POA: Diagnosis not present

## 2013-10-21 DIAGNOSIS — G8929 Other chronic pain: Secondary | ICD-10-CM | POA: Diagnosis not present

## 2013-10-23 ENCOUNTER — Other Ambulatory Visit: Payer: Self-pay | Admitting: *Deleted

## 2013-10-23 DIAGNOSIS — G8929 Other chronic pain: Secondary | ICD-10-CM | POA: Diagnosis not present

## 2013-10-23 DIAGNOSIS — M25562 Pain in left knee: Secondary | ICD-10-CM | POA: Diagnosis not present

## 2013-10-23 MED ORDER — METOPROLOL SUCCINATE ER 50 MG PO TB24: 50.0000 mg | ORAL_TABLET | Freq: Every morning | ORAL | Status: DC

## 2013-10-25 DIAGNOSIS — M25562 Pain in left knee: Secondary | ICD-10-CM | POA: Diagnosis not present

## 2013-10-25 DIAGNOSIS — G8929 Other chronic pain: Secondary | ICD-10-CM | POA: Diagnosis not present

## 2013-10-29 DIAGNOSIS — Z471 Aftercare following joint replacement surgery: Secondary | ICD-10-CM | POA: Diagnosis not present

## 2013-10-29 DIAGNOSIS — Z96652 Presence of left artificial knee joint: Secondary | ICD-10-CM | POA: Diagnosis not present

## 2013-11-08 DIAGNOSIS — Z23 Encounter for immunization: Secondary | ICD-10-CM | POA: Diagnosis not present

## 2013-11-08 DIAGNOSIS — D649 Anemia, unspecified: Secondary | ICD-10-CM | POA: Diagnosis not present

## 2013-11-08 DIAGNOSIS — Z283 Underimmunization status: Secondary | ICD-10-CM | POA: Diagnosis not present

## 2013-11-08 DIAGNOSIS — Z79899 Other long term (current) drug therapy: Secondary | ICD-10-CM | POA: Diagnosis not present

## 2013-12-09 ENCOUNTER — Telehealth: Payer: Self-pay | Admitting: Cardiology

## 2013-12-09 MED ORDER — LISINOPRIL 10 MG PO TABS: 10.0000 mg | ORAL_TABLET | Freq: Every day | ORAL | Status: DC

## 2013-12-09 NOTE — Telephone Encounter (Signed)
Instructed patient to take 10 mg Lisinopril daily and to check BP everyday for a week and call with results.

## 2013-12-09 NOTE — Telephone Encounter (Signed)
New message     Patient calling regarding blood pressure. Will explain when the nurse call back did not want to go into details on the phone .

## 2013-12-09 NOTE — Telephone Encounter (Signed)
Pt st her BP on Saturday was 155/87. Previously, pt was taken off Lisinopril for hypotension s/p knee replacement. Pt st on Saturday, she started taking Lisinopril 20 mg daily again for blood pressure. This AM, BP = 115/65.   Pt wants to know if Dr. Radford Pax is OK with adding the Lisinopril or if she wants to add another medication instead.  To Dr. Radford Pax for review and recommendations.

## 2013-12-09 NOTE — Telephone Encounter (Signed)
Have her take Lisinopril 10mg  daily and check BP daily for a week and call with results

## 2013-12-16 ENCOUNTER — Telehealth: Payer: Self-pay | Admitting: Cardiology

## 2013-12-16 NOTE — Telephone Encounter (Signed)
New message     Need to give nurse bp readings

## 2013-12-17 NOTE — Telephone Encounter (Signed)
F/U   Patient returning call to report BP readings beginning:  Mon 132/65 pulse 67, 2:30pm 123/68 pulse 66 Tues  12:45 pm 107/70 pulse 67  Wednesday 3:05pm 105/69 pulse 70, 6:45pm 127/69 pulse 63 Thurs 1:25pm 110/70 pulse 73  Fridiay 8:00 am 137/78 61 pulse,  12:11pm 127/74 pulse 64  Sat 7:20am 144/82 pulse 57,  9:20pm 120/70 pulse 64 Sun 10:45a 116/64 pulse 53,  6:00pm 130/72 pulse 64  Please contact patient at  [phone]

## 2013-12-17 NOTE — Telephone Encounter (Signed)
BP controlled - no changes in meds

## 2013-12-17 NOTE — Telephone Encounter (Signed)
To Dr. Turner for review. 

## 2013-12-17 NOTE — Telephone Encounter (Signed)
Left message to call back  

## 2013-12-18 NOTE — Telephone Encounter (Signed)
Informed patient that per Dr. Radford Pax, her BP is controlled and there will be no changes in medications.  Patient agrees with treatment plan.

## 2013-12-25 DIAGNOSIS — R1013 Epigastric pain: Secondary | ICD-10-CM | POA: Diagnosis not present

## 2013-12-27 ENCOUNTER — Encounter: Payer: Self-pay | Admitting: Cardiology

## 2013-12-27 DIAGNOSIS — Z6823 Body mass index (BMI) 23.0-23.9, adult: Secondary | ICD-10-CM | POA: Diagnosis not present

## 2013-12-27 DIAGNOSIS — E785 Hyperlipidemia, unspecified: Secondary | ICD-10-CM | POA: Diagnosis not present

## 2013-12-27 DIAGNOSIS — R1013 Epigastric pain: Secondary | ICD-10-CM | POA: Diagnosis not present

## 2013-12-30 DIAGNOSIS — R1013 Epigastric pain: Secondary | ICD-10-CM | POA: Diagnosis not present

## 2013-12-30 DIAGNOSIS — I10 Essential (primary) hypertension: Secondary | ICD-10-CM | POA: Diagnosis not present

## 2013-12-30 DIAGNOSIS — M5136 Other intervertebral disc degeneration, lumbar region: Secondary | ICD-10-CM | POA: Diagnosis not present

## 2014-01-02 ENCOUNTER — Other Ambulatory Visit: Payer: Self-pay | Admitting: Cardiology

## 2014-01-02 DIAGNOSIS — R1013 Epigastric pain: Secondary | ICD-10-CM | POA: Diagnosis not present

## 2014-01-16 DIAGNOSIS — R350 Frequency of micturition: Secondary | ICD-10-CM | POA: Diagnosis not present

## 2014-01-16 DIAGNOSIS — K529 Noninfective gastroenteritis and colitis, unspecified: Secondary | ICD-10-CM | POA: Diagnosis not present

## 2014-01-22 DIAGNOSIS — R197 Diarrhea, unspecified: Secondary | ICD-10-CM | POA: Diagnosis not present

## 2014-01-22 DIAGNOSIS — Z471 Aftercare following joint replacement surgery: Secondary | ICD-10-CM | POA: Diagnosis not present

## 2014-01-22 DIAGNOSIS — K219 Gastro-esophageal reflux disease without esophagitis: Secondary | ICD-10-CM | POA: Diagnosis not present

## 2014-01-22 DIAGNOSIS — Z96652 Presence of left artificial knee joint: Secondary | ICD-10-CM | POA: Diagnosis not present

## 2014-01-27 DIAGNOSIS — R197 Diarrhea, unspecified: Secondary | ICD-10-CM | POA: Diagnosis not present

## 2014-01-27 DIAGNOSIS — A09 Infectious gastroenteritis and colitis, unspecified: Secondary | ICD-10-CM | POA: Diagnosis not present

## 2014-02-26 ENCOUNTER — Ambulatory Visit (INDEPENDENT_AMBULATORY_CARE_PROVIDER_SITE_OTHER): Payer: Medicare Other | Admitting: Cardiology

## 2014-02-26 ENCOUNTER — Encounter: Payer: Self-pay | Admitting: Cardiology

## 2014-02-26 VITALS — BP 124/86 | HR 59 | Ht 64.5 in | Wt 142.4 lb

## 2014-02-26 DIAGNOSIS — I493 Ventricular premature depolarization: Secondary | ICD-10-CM | POA: Diagnosis not present

## 2014-02-26 DIAGNOSIS — R001 Bradycardia, unspecified: Secondary | ICD-10-CM

## 2014-02-26 DIAGNOSIS — I10 Essential (primary) hypertension: Secondary | ICD-10-CM | POA: Diagnosis not present

## 2014-02-26 NOTE — Patient Instructions (Signed)
Your physician recommends that you continue on your current medications as directed. Please refer to the Current Medication list given to you today.  Your physician wants you to follow-up in: 6 months with Dr Turner You will receive a reminder letter in the mail two months in advance. If you don't receive a letter, please call our office to schedule the follow-up appointment.  

## 2014-02-26 NOTE — Progress Notes (Signed)
Cardiology Office Note   Date:  02/26/2014   ID:  [name] [name], DOB 03-Feb-1930, MRN [id number]  PCP:  Jerlyn Ly, MD  Cardiologist:   Sueanne Margarita, MD   No chief complaint on file.     History of Present Illness: [name] [name] is a 81 y.o. female with a history of PVC's and HTN. She denies any palpitations, chest pain, SOB, DOE, LE edema, dizziness or syncope. She has not had any PVC's since I saw her last and she thinks that they are anxiety drivin.       Past Medical History  Diagnosis Date  . DEGENERATIVE JOINT DISEASE, KNEE   . GENU VALGUM   . 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 ROTATOR CUFF SYNDROME, LEFT     CANNOT LIFT LEFT ARM ALL THE WAY UP  . UNEQUAL LEG LENGTH   . Bradycardia     asymptomatic  . PVC's (premature ventricular contractions)   . Hypertension   . Hyperlipidemia   . COPD (chronic obstructive pulmonary disease)     MILD, NO INHALERS USED  . Pneumonia 2013    X 2  . Anxiety   . UTI (urinary tract infection)     STARTED AUGMENTIN  ON 09-10-13  . Breast CA 1993 OR 1994    LEFT, SURGERY AND RADIATION DONE  . Family history of anesthesia complication     NEPHEW NAUSEA/VOMITING  . Numbness of leg 2011    just left shin    Past Surgical History  Procedure Laterality Date  . Total hip arthroplasty Bilateral LEFT 2008 AND RIGHT 2010  . Breast surgery Left 1993 OR 1994    LUMPECTOMY AND RADIATION DONE  . Appendectomy  AGE 48  . Nasal sinus surgery  2006  . Total knee arthroplasty Left 09/17/2013    Procedure: LEFT TOTAL KNEE ARTHROPLASTY;  Surgeon: Mauri Pole, MD;  Location: [facility];  Service: Orthopedics;  Laterality: Left;     Current Outpatient Prescriptions  Medication Sig Dispense Refill  . ANUCORT-HC 25 MG suppository Place 25 mg rectally as needed. For irritation  1  . atorvastatin (LIPITOR) 20 MG tablet Take 20 mg by mouth every evening.     . diphenoxylate-atropine (LOMOTIL) 2.5-0.025 MG per tablet Take  1 tablet by mouth 4 (four) times daily as needed for diarrhea or loose stools. 120 tablet 0  . escitalopram (LEXAPRO) 10 MG tablet Take 10 mg by mouth every morning.    [name] ketotifen (THERA TEARS ALLERGY) 0.025 % ophthalmic solution Place 1 drop into both eyes 2 (two) times daily as needed (Dry eyes).    [name] lisinopril (PRINIVIL,ZESTRIL) 10 MG tablet Take 1 tablet (10 mg total) by mouth daily. 30 tablet 3  . metoprolol succinate (TOPROL-XL) 50 MG 24 hr tablet TAKE 1 TABLET EVERY MORNING WITH OR IMMEDIATELY FOLLOWING A MEAL 90 tablet 0  . Multiple Vitamin (MULTIVITAMIN WITH MINERALS) TABS tablet Take 1 tablet by mouth daily.    [name] NEXIUM 40 MG capsule Take 40 mg by mouth as needed. For indigestion  0  . Vitamin D, Ergocalciferol, (DRISDOL) 50000 UNITS CAPS capsule Take 50,000 Units by mouth every 7 (seven) days. Mondays     No current facility-administered medications for this visit.    Allergies:   Cardizem and Other    Social History:  The patient  reports that she quit smoking about 22 years ago. Her smoking use included Cigarettes. She has a 3.75 pack-year  smoking history. She has never used smokeless tobacco. She reports that she drinks alcohol. She reports that she does not use illicit drugs.   Family History:  The patient's family history includes Diabetes in her mother; Heart Problems in her father; Hypertension in her brother.    ROS:  Please see the history of present illness.   Otherwise, review of systems are positive for none.   All other systems are reviewed and negative.    PHYSICAL EXAM: VS:  BP 124/86 mmHg  Pulse 59  Ht 5' 4.5" (1.638 m)  Wt 142 lb 6.4 oz (64.592 kg)  BMI 24.07 kg/m2  SpO2 98% , BMI Body mass index is 24.07 kg/(m^2). GEN: Well nourished, well developed, in no acute distress HEENT: normal Neck: no JVD, carotid bruits, or masses Cardiac: RRR; no murmurs, rubs, or gallops,no edema  Respiratory:  clear to auscultation bilaterally, normal work of  breathing GI: soft, nontender, nondistended, + BS MS: no deformity or atrophy Skin: warm and dry, no rash Neuro:  Strength and sensation are intact Psych: euthymic mood, full affect   EKG:  EKG is not ordered today.    Recent Labs: 09/19/2013: BUN 11; Creatinine 0.68; Hemoglobin 10.8*; Platelets 165; Potassium 4.4; Sodium 139    Lipid Panel No results found for: CHOL, TRIG, HDL, CHOLHDL, VLDL, LDLCALC, LDLDIRECT    Wt Readings from Last 3 Encounters:  02/26/14 142 lb 6.4 oz (64.592 kg)  09/20/13 140 lb (63.504 kg)  09/17/13 140 lb (63.504 kg)     ASSESSMENT AND PLAN:  1. HTN - well controlled - continue Lisinopril and metoprolol  2. PVC's - continue beta blocker  3.   Asymptomatic bradycardia     Current medicines are reviewed at length with the patient today.  The patient does not have concerns regarding medicines.  The following changes have been made:  no change  Labs/ tests ordered today include: BMET  No orders of the defined types were placed in this encounter.     Disposition:   FU with me in 6 months   Signed, Sueanne Margarita, MD  02/26/2014 3:46 PM    [facility] [place], [place], [place]  [address] Phone: [phone]; Fax: [phone]

## 2014-03-04 DIAGNOSIS — M19012 Primary osteoarthritis, left shoulder: Secondary | ICD-10-CM | POA: Diagnosis not present

## 2014-03-07 ENCOUNTER — Telehealth: Payer: Self-pay | Admitting: Cardiology

## 2014-03-07 DIAGNOSIS — M859 Disorder of bone density and structure, unspecified: Secondary | ICD-10-CM | POA: Diagnosis not present

## 2014-03-07 DIAGNOSIS — Z008 Encounter for other general examination: Secondary | ICD-10-CM | POA: Diagnosis not present

## 2014-03-07 DIAGNOSIS — I10 Essential (primary) hypertension: Secondary | ICD-10-CM | POA: Diagnosis not present

## 2014-03-07 DIAGNOSIS — E785 Hyperlipidemia, unspecified: Secondary | ICD-10-CM | POA: Diagnosis not present

## 2014-03-07 DIAGNOSIS — N390 Urinary tract infection, site not specified: Secondary | ICD-10-CM | POA: Diagnosis not present

## 2014-03-07 NOTE — Telephone Encounter (Signed)
New message      Pt c/o BP issue: STAT if pt c/o blurred vision, one-sided weakness or slurred speech  1. What are your last 5 BP readings? 87/60, 109/68  2. Are you having any other symptoms (ex. Dizziness, headache, blurred vision, passed out)? Feel light headed 3. What is your BP issue? Pt feels like her bp is too low

## 2014-03-07 NOTE — Telephone Encounter (Signed)
Left message for patient to stay hydrated this weekend and to not take her BP medications if her BP is 110/65 or below.  Instructed patient to go to the ER if her BP drops low and she has symptoms.  Instructed patient I will follow up with her on Monday to check on her.

## 2014-03-10 NOTE — Telephone Encounter (Signed)
Patient's BP logs for the weekend: 2/19: 1040PM 122/79 2/20: 0920 138/92 - took Toprol          0925 115/84          1200 137/88 - took lisinopril          2230 108/69 2/21: AM 113/85- took Toprol          Lunch time- 133/83 -took lisinopril          2018 105/60  Patient extremely worried about her BP, saying some readings were too low. Explained to patient that all her readings were normal values of no concern unless she is symptomatic as well. Patient st she has been having sinus issues, so she is unsure whether her "lightheadedness" described Friday is due to low BP or sinus pressure.  Patient requests Dr. Radford Pax to review her medications and provide recommendations.

## 2014-03-10 NOTE — Telephone Encounter (Signed)
Decrease Lisinopril to 5mg  daily and recheck BP daily for a week and call

## 2014-03-11 MED ORDER — LISINOPRIL 5 MG PO TABS: 5.0000 mg | ORAL_TABLET | Freq: Every day | ORAL | Status: DC

## 2014-03-11 NOTE — Telephone Encounter (Signed)
Instructed patient to DECREASE LISINOPRIL to 5 mg daily and to check BP daily for a week and call with results. Patient agrees with treatment plan.

## 2014-03-14 DIAGNOSIS — I5189 Other ill-defined heart diseases: Secondary | ICD-10-CM | POA: Diagnosis not present

## 2014-03-14 DIAGNOSIS — Z0000 Encounter for general adult medical examination without abnormal findings: Secondary | ICD-10-CM | POA: Diagnosis not present

## 2014-03-14 DIAGNOSIS — Z6823 Body mass index (BMI) 23.0-23.9, adult: Secondary | ICD-10-CM | POA: Diagnosis not present

## 2014-03-14 DIAGNOSIS — Z1389 Encounter for screening for other disorder: Secondary | ICD-10-CM | POA: Diagnosis not present

## 2014-03-14 DIAGNOSIS — I10 Essential (primary) hypertension: Secondary | ICD-10-CM | POA: Diagnosis not present

## 2014-03-14 DIAGNOSIS — J45909 Unspecified asthma, uncomplicated: Secondary | ICD-10-CM | POA: Diagnosis not present

## 2014-03-14 DIAGNOSIS — E785 Hyperlipidemia, unspecified: Secondary | ICD-10-CM | POA: Diagnosis not present

## 2014-03-14 DIAGNOSIS — J449 Chronic obstructive pulmonary disease, unspecified: Secondary | ICD-10-CM | POA: Diagnosis not present

## 2014-03-14 DIAGNOSIS — J309 Allergic rhinitis, unspecified: Secondary | ICD-10-CM | POA: Diagnosis not present

## 2014-03-14 DIAGNOSIS — M1990 Unspecified osteoarthritis, unspecified site: Secondary | ICD-10-CM | POA: Diagnosis not present

## 2014-03-14 DIAGNOSIS — M5380 Other specified dorsopathies, site unspecified: Secondary | ICD-10-CM | POA: Diagnosis not present

## 2014-03-16 ENCOUNTER — Other Ambulatory Visit: Payer: Self-pay | Admitting: Cardiology

## 2014-03-18 DIAGNOSIS — Z1212 Encounter for screening for malignant neoplasm of rectum: Secondary | ICD-10-CM | POA: Diagnosis not present

## 2014-03-19 ENCOUNTER — Telehealth: Payer: Self-pay | Admitting: Cardiology

## 2014-03-19 NOTE — Telephone Encounter (Signed)
To Dr. Turner.  

## 2014-03-19 NOTE — Telephone Encounter (Signed)
Left message for patient that BP is good and to continue current medications. Instructed her to call if she has any questions.

## 2014-03-19 NOTE — Telephone Encounter (Signed)
BP good - continue current meds

## 2014-03-19 NOTE — Telephone Encounter (Signed)
Pt c/o BP issue: STAT if pt c/o blurred vision, one-sided weakness or slurred speech  1. What are your last 5 BP readings? 2.24.16   113/78   63 pulse 2.25.16   116/75   65 pulse 2.26.16    132/83  68 pulse 2.27.16    125/82  66 pulse 2.28.16    117/70  58 pulse 2.29.16     115/79  70 pulse 3.1.16      115/77  66 pulse  2. Are you having any other symptoms (ex. Dizziness, headache, blurred vision, passed out)? NO  3. What is your BP issue? Pt was told by Valetta Fuller to call back with these BP readings. Please call pt if you have any questions.

## 2014-05-01 ENCOUNTER — Other Ambulatory Visit: Payer: Self-pay

## 2014-05-01 DIAGNOSIS — Z853 Personal history of malignant neoplasm of breast: Secondary | ICD-10-CM

## 2014-05-01 DIAGNOSIS — Z9889 Other specified postprocedural states: Secondary | ICD-10-CM

## 2014-05-01 DIAGNOSIS — Z1231 Encounter for screening mammogram for malignant neoplasm of breast: Secondary | ICD-10-CM

## 2014-05-28 DIAGNOSIS — M19012 Primary osteoarthritis, left shoulder: Secondary | ICD-10-CM | POA: Diagnosis not present

## 2014-05-28 DIAGNOSIS — M25512 Pain in left shoulder: Secondary | ICD-10-CM | POA: Diagnosis not present

## 2014-05-28 DIAGNOSIS — M542 Cervicalgia: Secondary | ICD-10-CM | POA: Diagnosis not present

## 2014-05-28 DIAGNOSIS — M5030 Other cervical disc degeneration, unspecified cervical region: Secondary | ICD-10-CM | POA: Diagnosis not present

## 2014-06-06 ENCOUNTER — Encounter: Payer: Self-pay | Admitting: Cardiology

## 2014-06-18 ENCOUNTER — Ambulatory Visit
Admission: RE | Admit: 2014-06-18 | Discharge: 2014-06-18 | Disposition: A | Discharge status: Home / Self Care | Payer: Medicare Other | Source: Ambulatory Visit

## 2014-06-18 DIAGNOSIS — Z1231 Encounter for screening mammogram for malignant neoplasm of breast: Secondary | ICD-10-CM | POA: Diagnosis not present

## 2014-06-18 DIAGNOSIS — Z853 Personal history of malignant neoplasm of breast: Secondary | ICD-10-CM

## 2014-06-18 DIAGNOSIS — Z9889 Other specified postprocedural states: Secondary | ICD-10-CM

## 2014-06-23 DIAGNOSIS — E785 Hyperlipidemia, unspecified: Secondary | ICD-10-CM | POA: Diagnosis not present

## 2014-06-24 DIAGNOSIS — M1990 Unspecified osteoarthritis, unspecified site: Secondary | ICD-10-CM | POA: Diagnosis not present

## 2014-07-02 DIAGNOSIS — M19012 Primary osteoarthritis, left shoulder: Secondary | ICD-10-CM | POA: Diagnosis not present

## 2014-07-12 DIAGNOSIS — M542 Cervicalgia: Secondary | ICD-10-CM | POA: Diagnosis not present

## 2014-08-04 DIAGNOSIS — J449 Chronic obstructive pulmonary disease, unspecified: Secondary | ICD-10-CM | POA: Diagnosis not present

## 2014-08-04 DIAGNOSIS — J309 Allergic rhinitis, unspecified: Secondary | ICD-10-CM | POA: Diagnosis not present

## 2014-08-04 DIAGNOSIS — Z6822 Body mass index (BMI) 22.0-22.9, adult: Secondary | ICD-10-CM | POA: Diagnosis not present

## 2014-08-04 DIAGNOSIS — I10 Essential (primary) hypertension: Secondary | ICD-10-CM | POA: Diagnosis not present

## 2014-08-04 DIAGNOSIS — J45909 Unspecified asthma, uncomplicated: Secondary | ICD-10-CM | POA: Diagnosis not present

## 2014-08-04 DIAGNOSIS — R05 Cough: Secondary | ICD-10-CM | POA: Diagnosis not present

## 2014-08-04 DIAGNOSIS — M5380 Other specified dorsopathies, site unspecified: Secondary | ICD-10-CM | POA: Diagnosis not present

## 2014-08-15 DIAGNOSIS — M19012 Primary osteoarthritis, left shoulder: Secondary | ICD-10-CM | POA: Diagnosis not present

## 2014-08-15 DIAGNOSIS — M542 Cervicalgia: Secondary | ICD-10-CM | POA: Diagnosis not present

## 2014-08-27 ENCOUNTER — Ambulatory Visit (INDEPENDENT_AMBULATORY_CARE_PROVIDER_SITE_OTHER): Payer: Medicare Other | Admitting: Cardiology

## 2014-08-27 ENCOUNTER — Encounter: Payer: Self-pay | Admitting: Cardiology

## 2014-08-27 VITALS — BP 146/80 | HR 58 | Ht 64.5 in | Wt 137.8 lb

## 2014-08-27 DIAGNOSIS — I10 Essential (primary) hypertension: Secondary | ICD-10-CM | POA: Diagnosis not present

## 2014-08-27 DIAGNOSIS — R001 Bradycardia, unspecified: Secondary | ICD-10-CM | POA: Diagnosis not present

## 2014-08-27 DIAGNOSIS — I493 Ventricular premature depolarization: Secondary | ICD-10-CM

## 2014-08-27 NOTE — Progress Notes (Signed)
Cardiology Office Note   Date:  08/27/2014   ID:  [name] [name], DOB 01-12-1931, MRN [id number]  PCP:  Jerlyn Ly, MD    Chief Complaint  Patient presents with  . Follow-up    PVC's      History of Present Illness: [name] [name] is a 81 y.o. female with a history of PVC's and HTN. She denies any palpitations, chest pain, SOB, DOE, LE edema, dizziness or syncope. She has not had any PVC's since I saw her last and she thinks that they are anxiety drivin.      Past Medical History  Diagnosis Date  . DEGENERATIVE JOINT DISEASE, KNEE   . GENU VALGUM   . 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 ROTATOR CUFF SYNDROME, LEFT     CANNOT LIFT LEFT ARM ALL THE WAY UP  . UNEQUAL LEG LENGTH   . Bradycardia     asymptomatic  . PVC's (premature ventricular contractions)   . Hypertension   . Hyperlipidemia   . COPD (chronic obstructive pulmonary disease)     MILD, NO INHALERS USED  . Pneumonia 2013    X 2  . Anxiety   . UTI (urinary tract infection)     STARTED AUGMENTIN  ON 09-10-13  . Breast CA 1993 OR 1994    LEFT, SURGERY AND RADIATION DONE  . Family history of anesthesia complication     NEPHEW NAUSEA/VOMITING  . Numbness of leg 2011    just left shin    Past Surgical History  Procedure Laterality Date  . Total hip arthroplasty Bilateral LEFT 2008 AND RIGHT 2010  . Breast surgery Left 1993 OR 1994    LUMPECTOMY AND RADIATION DONE  . Appendectomy  AGE 74  . Nasal sinus surgery  2006  . Total knee arthroplasty Left 09/17/2013    Procedure: LEFT TOTAL KNEE ARTHROPLASTY;  Surgeon: Mauri Pole, MD;  Location: [facility];  Service: Orthopedics;  Laterality: Left;     Current Outpatient Prescriptions  Medication Sig Dispense Refill  . ANUCORT-HC 25 MG suppository Place 25 mg rectally as needed. For irritation  1  . atorvastatin (LIPITOR) 20 MG tablet Take 20 mg by mouth every evening.     [name] BONIVA 150 MG tablet Take 150 mg by mouth  every 30 (thirty) days.    . diphenoxylate-atropine (LOMOTIL) 2.5-0.025 MG per tablet Take 1 tablet by mouth 4 (four) times daily as needed for diarrhea or loose stools. 120 tablet 0  . escitalopram (LEXAPRO) 10 MG tablet Take 10 mg by mouth every morning.    . eszopiclone (LUNESTA) 2 MG TABS tablet Take 2 mg by mouth as needed for sleep.   0  . ketotifen (THERA TEARS ALLERGY) 0.025 % ophthalmic solution Place 1 drop into both eyes 2 (two) times daily as needed (Dry eyes).    [name] lisinopril (PRINIVIL,ZESTRIL) 5 MG tablet Take 1 tablet (5 mg total) by mouth daily. 30 tablet 6  . metoprolol succinate (TOPROL-XL) 50 MG 24 hr tablet Take 1 tablet (50 mg total) by mouth daily. 90 tablet 1  . Multiple Vitamin (MULTIVITAMIN WITH MINERALS) TABS tablet Take 1 tablet by mouth daily.    [name] NEXIUM 40 MG capsule Take 40 mg by mouth as needed. For indigestion  0  . predniSONE (DELTASONE) 5 MG tablet Take 5 mg by mouth daily.  0  .  Vitamin D, Ergocalciferol, (DRISDOL) 50000 UNITS CAPS capsule Take 50,000 Units by mouth every 7 (seven) days. Mondays     No current facility-administered medications for this visit.    Allergies:   Cardizem and Other    Social History:  The patient  reports that she quit smoking about 22 years ago. Her smoking use included Cigarettes. She has a 3.75 pack-year smoking history. She has never used smokeless tobacco. She reports that she drinks alcohol. She reports that she does not use illicit drugs.   Family History:  The patient's family history includes Diabetes in her mother; Heart Problems in her father; Hypertension in her brother.    ROS:  Please see the history of present illness.   Otherwise, review of systems are positive for none.   All other systems are reviewed and negative.    PHYSICAL EXAM: VS:  BP 146/80 mmHg  Pulse 58  Ht 5' 4.5" (1.638 m)  Wt 137 lb 12.8 oz (62.506 kg)  BMI 23.30 kg/m2 , BMI Body mass index is 23.3 kg/(m^2). GEN: Well nourished, well  developed, in no acute distress HEENT: normal Neck: no JVD, carotid bruits, or masses Cardiac: RRR; no murmurs, rubs, or gallops,no edema  Respiratory:  clear to auscultation bilaterally, normal work of breathing GI: soft, nontender, nondistended, + BS MS: no deformity or atrophy Skin: warm and dry, no rash Neuro:  Strength and sensation are intact Psych: euthymic mood, full affect   EKG:  EKG is ordered today. The ekg ordered today demonstrates sinus bradycardia at 58bpm with PVC   Recent Labs: 09/19/2013: BUN 11; Creatinine, Ser 0.68; Hemoglobin 10.8*; Platelets 165; Potassium 4.4; Sodium 139    Lipid Panel No results found for: CHOL, TRIG, HDL, CHOLHDL, VLDL, LDLCALC, LDLDIRECT    Wt Readings from Last 3 Encounters:  08/27/14 137 lb 12.8 oz (62.506 kg)  02/26/14 142 lb 6.4 oz (64.592 kg)  09/20/13 140 lb (63.504 kg)    ASSESSMENT AND PLAN:  1. HTN - borderline controlled but at home - continue Lisinopril and metoprolol  2. PVC's - continue beta blocker  3. Asymptomatic bradycardia     Current medicines are reviewed at length with the patient today.  The patient does not have concerns regarding medicines.  The following changes have been made:  no change  Labs/ tests ordered today: See above Assessment and Plan No orders of the defined types were placed in this encounter.     Disposition:   FU with me in 1 year  Signed, Sueanne Margarita, MD  08/27/2014 4:20 PM    [facility] [place], [place], [place]  [address] Phone: [phone]; Fax: [phone]

## 2014-08-27 NOTE — Patient Instructions (Signed)

## 2014-08-28 ENCOUNTER — Telehealth: Payer: Self-pay | Admitting: Cardiology

## 2014-08-28 MED ORDER — LISINOPRIL 5 MG PO TABS: 5.0000 mg | ORAL_TABLET | Freq: Every day | ORAL | Status: DC

## 2014-08-28 NOTE — Telephone Encounter (Signed)
New message     Pt c/o BP issue: STAT if pt c/o blurred vision, one-sided weakness or slurred speech  1. What are your last 5 BP readings? B/p today @ 8:30am 155/95 pulse 60   2. Are you having any other symptoms (ex. Dizziness, headache, blurred vision, passed out)? No  3. What is your BP issue? B/p running higher than normal  Pt is taking prednisone that pt and Dr Radford Pax talked about Please call to discuss

## 2014-08-28 NOTE — Telephone Encounter (Signed)
Patient st she was concerned because her BP was 155/95 this morning before she took her medications. A couple hours after she took her medications, patient's BP dropped down to 136/79. Informed patient that this is fine - to continue to monitor BP and to call if her BP remains elevated after her medication. Patient agrees with treatment plan and is grateful for call back. Patient also requests Lisinopril refill called to [facility]. Rx sent.

## 2014-08-29 ENCOUNTER — Telehealth: Payer: Self-pay | Admitting: Cardiology

## 2014-08-29 MED ORDER — LISINOPRIL 10 MG PO TABS: 10.0000 mg | ORAL_TABLET | Freq: Every day | ORAL | Status: DC

## 2014-08-29 NOTE — Telephone Encounter (Signed)
New message     Pt c/o BP issue: STAT if pt c/o blurred vision, one-sided weakness or slurred speech  1. What are your last 5 BP readings? 162/97 this morning @ 8:10  2. Are you having any other symptoms (ex. Dizziness, headache, blurred vision, passed out)? No  3. What is your BP issue? Running higher than normal; Pt states feeling a little pressure in back of head

## 2014-08-29 NOTE — Telephone Encounter (Signed)
OK to increase Lisinopril to 10mg  daily and check BP daily for a week and call the office with results.  If BP remains >150/74mmHg call the office.

## 2014-08-29 NOTE — Telephone Encounter (Signed)
Called patient back about her elevated BP. This morning her BP was 162/97 an hour after taking her medications.  Patient has not taken her prednisone today. Patient wants to see if she can take another lisinopril to help. Patient stated she was on lisinopril 20 mg in the past. Patient still has 10 mg and 20 mg tablets of the lisinopril she can take. Informed patient that message will be sent to Dr. Radford Pax to see if she can increase her lisinopril. Patient seems very anxious about her BP. Encouraged patient to take it easy and relax, that the office will call her back shortly. Will send message to Dr. Radford Pax.

## 2014-08-29 NOTE — Telephone Encounter (Signed)
Advised patient of Dr. Theodosia Blender recommendations. New rx sent to [facility] per pt request. Patient verbalized understanding and agreeable to plan.

## 2014-09-05 ENCOUNTER — Telehealth: Payer: Self-pay | Admitting: Cardiology

## 2014-09-05 ENCOUNTER — Ambulatory Visit (INDEPENDENT_AMBULATORY_CARE_PROVIDER_SITE_OTHER): Payer: Medicare Other | Admitting: Ophthalmology

## 2014-09-05 NOTE — Telephone Encounter (Signed)
Pt provided below BP and HR readings from the past week. Pt worried her BP is still elevatedfrom time to time.  Pt educated that BP is expected to elevated from time to time over the day, she is not to worry as long as it is not staying there. Pt was on Prednisone but finished it a little over a week ago.  Pt stated that she would like to go back to working out with her trainer, doing light weights, if Dr. Radford Pax says it is okay. Pt states she has not worked with her trainer or exercised over the past week as she was worried it would increase her BP to much.

## 2014-09-05 NOTE — Telephone Encounter (Signed)
Pt aware she can exercise and BP look good according to Dr. Radford Pax. Pt stated she is seeing Dr. Alvan Dame next week and make sure he and Dr. Radford Pax are on the same page with her medications. Pt has no other questions at this time.

## 2014-09-05 NOTE — Telephone Encounter (Signed)
Patient's BP has improved after stopping steroids.  OK to exercise and training

## 2014-09-05 NOTE — Telephone Encounter (Signed)
Left message for pt to call back to let her know Dr. Radford Pax says it is okay for her to exercise and train.

## 2014-09-05 NOTE — Telephone Encounter (Signed)
New message     Pt was to call today with b/p reading for the week: 8-11 8:30am 155/95  Pulse 60; 1pm 136/79 pulse 65 8-12 162/97 pulse 65 8-13 118/77 pulse 63 8-14 11:30am 149/97 pulse 58; 1pm 124/77 pulse 58 8-15 117/78 pulse 62 8-16 129/77 pulse 63 8-17 131/77 pulse 65 8-18 121/76 pulse 61    Can pt do light weight training now? Pt is now taking lorazapam 1/2 pill along with lisinopril Please call to discuss

## 2014-09-10 DIAGNOSIS — I10 Essential (primary) hypertension: Secondary | ICD-10-CM | POA: Diagnosis not present

## 2014-09-10 DIAGNOSIS — H3531 Nonexudative age-related macular degeneration: Secondary | ICD-10-CM | POA: Diagnosis not present

## 2014-09-10 DIAGNOSIS — H18413 Arcus senilis, bilateral: Secondary | ICD-10-CM | POA: Diagnosis not present

## 2014-09-10 DIAGNOSIS — Z961 Presence of intraocular lens: Secondary | ICD-10-CM | POA: Diagnosis not present

## 2014-09-11 ENCOUNTER — Other Ambulatory Visit: Payer: Self-pay | Admitting: Cardiology

## 2014-09-30 DIAGNOSIS — G5602 Carpal tunnel syndrome, left upper limb: Secondary | ICD-10-CM | POA: Diagnosis not present

## 2014-10-13 DIAGNOSIS — M19012 Primary osteoarthritis, left shoulder: Secondary | ICD-10-CM | POA: Diagnosis not present

## 2014-10-13 DIAGNOSIS — M25512 Pain in left shoulder: Secondary | ICD-10-CM | POA: Diagnosis not present

## 2014-10-15 ENCOUNTER — Ambulatory Visit (INDEPENDENT_AMBULATORY_CARE_PROVIDER_SITE_OTHER): Payer: Medicare Other | Admitting: Ophthalmology

## 2014-10-15 DIAGNOSIS — H43813 Vitreous degeneration, bilateral: Secondary | ICD-10-CM

## 2014-10-15 DIAGNOSIS — H35033 Hypertensive retinopathy, bilateral: Secondary | ICD-10-CM

## 2014-10-15 DIAGNOSIS — H35372 Puckering of macula, left eye: Secondary | ICD-10-CM | POA: Diagnosis not present

## 2014-10-15 DIAGNOSIS — I10 Essential (primary) hypertension: Secondary | ICD-10-CM

## 2014-10-15 DIAGNOSIS — H3531 Nonexudative age-related macular degeneration: Secondary | ICD-10-CM | POA: Diagnosis not present

## 2014-10-17 DIAGNOSIS — M25512 Pain in left shoulder: Secondary | ICD-10-CM | POA: Diagnosis not present

## 2014-10-17 DIAGNOSIS — M5032 Other cervical disc degeneration, mid-cervical region: Secondary | ICD-10-CM | POA: Diagnosis not present

## 2014-10-17 DIAGNOSIS — M542 Cervicalgia: Secondary | ICD-10-CM | POA: Diagnosis not present

## 2014-10-17 DIAGNOSIS — M19012 Primary osteoarthritis, left shoulder: Secondary | ICD-10-CM | POA: Diagnosis not present

## 2014-10-23 DIAGNOSIS — G5602 Carpal tunnel syndrome, left upper limb: Secondary | ICD-10-CM | POA: Diagnosis not present

## 2014-11-05 DIAGNOSIS — G5602 Carpal tunnel syndrome, left upper limb: Secondary | ICD-10-CM | POA: Diagnosis not present

## 2014-11-17 DIAGNOSIS — G5602 Carpal tunnel syndrome, left upper limb: Secondary | ICD-10-CM | POA: Diagnosis not present

## 2014-11-17 DIAGNOSIS — Z4789 Encounter for other orthopedic aftercare: Secondary | ICD-10-CM | POA: Diagnosis not present

## 2014-11-22 DIAGNOSIS — Z23 Encounter for immunization: Secondary | ICD-10-CM | POA: Diagnosis not present

## 2014-12-15 DIAGNOSIS — Z4789 Encounter for other orthopedic aftercare: Secondary | ICD-10-CM | POA: Diagnosis not present

## 2014-12-15 DIAGNOSIS — G5602 Carpal tunnel syndrome, left upper limb: Secondary | ICD-10-CM | POA: Diagnosis not present

## 2015-01-21 DIAGNOSIS — M25512 Pain in left shoulder: Secondary | ICD-10-CM | POA: Diagnosis not present

## 2015-01-21 DIAGNOSIS — M4802 Spinal stenosis, cervical region: Secondary | ICD-10-CM | POA: Diagnosis not present

## 2015-02-05 DIAGNOSIS — M19012 Primary osteoarthritis, left shoulder: Secondary | ICD-10-CM | POA: Diagnosis not present

## 2015-02-06 DIAGNOSIS — G5602 Carpal tunnel syndrome, left upper limb: Secondary | ICD-10-CM | POA: Diagnosis not present

## 2015-02-06 DIAGNOSIS — Z4789 Encounter for other orthopedic aftercare: Secondary | ICD-10-CM | POA: Diagnosis not present

## 2015-02-13 DIAGNOSIS — M19012 Primary osteoarthritis, left shoulder: Secondary | ICD-10-CM | POA: Diagnosis not present

## 2015-05-04 DIAGNOSIS — N390 Urinary tract infection, site not specified: Secondary | ICD-10-CM | POA: Diagnosis not present

## 2015-05-04 DIAGNOSIS — I10 Essential (primary) hypertension: Secondary | ICD-10-CM | POA: Diagnosis not present

## 2015-05-04 DIAGNOSIS — R8299 Other abnormal findings in urine: Secondary | ICD-10-CM | POA: Diagnosis not present

## 2015-05-04 DIAGNOSIS — E784 Other hyperlipidemia: Secondary | ICD-10-CM | POA: Diagnosis not present

## 2015-05-08 ENCOUNTER — Other Ambulatory Visit: Payer: Self-pay

## 2015-05-08 DIAGNOSIS — Z9889 Other specified postprocedural states: Secondary | ICD-10-CM

## 2015-05-08 DIAGNOSIS — Z853 Personal history of malignant neoplasm of breast: Secondary | ICD-10-CM

## 2015-05-08 DIAGNOSIS — Z1231 Encounter for screening mammogram for malignant neoplasm of breast: Secondary | ICD-10-CM

## 2015-05-11 DIAGNOSIS — Z23 Encounter for immunization: Secondary | ICD-10-CM | POA: Diagnosis not present

## 2015-05-11 DIAGNOSIS — R600 Localized edema: Secondary | ICD-10-CM | POA: Diagnosis not present

## 2015-05-11 DIAGNOSIS — Z0000 Encounter for general adult medical examination without abnormal findings: Secondary | ICD-10-CM | POA: Diagnosis not present

## 2015-05-11 DIAGNOSIS — C50919 Malignant neoplasm of unspecified site of unspecified female breast: Secondary | ICD-10-CM | POA: Diagnosis not present

## 2015-05-11 DIAGNOSIS — H9190 Unspecified hearing loss, unspecified ear: Secondary | ICD-10-CM | POA: Insufficient documentation

## 2015-05-11 DIAGNOSIS — J45998 Other asthma: Secondary | ICD-10-CM | POA: Diagnosis not present

## 2015-05-11 DIAGNOSIS — J449 Chronic obstructive pulmonary disease, unspecified: Secondary | ICD-10-CM | POA: Diagnosis not present

## 2015-05-11 DIAGNOSIS — I5189 Other ill-defined heart diseases: Secondary | ICD-10-CM | POA: Diagnosis not present

## 2015-05-11 DIAGNOSIS — Z1389 Encounter for screening for other disorder: Secondary | ICD-10-CM | POA: Diagnosis not present

## 2015-05-11 DIAGNOSIS — F418 Other specified anxiety disorders: Secondary | ICD-10-CM | POA: Diagnosis not present

## 2015-05-11 DIAGNOSIS — M899 Disorder of bone, unspecified: Secondary | ICD-10-CM | POA: Insufficient documentation

## 2015-05-11 DIAGNOSIS — D6489 Other specified anemias: Secondary | ICD-10-CM | POA: Diagnosis not present

## 2015-05-11 DIAGNOSIS — Z6822 Body mass index (BMI) 22.0-22.9, adult: Secondary | ICD-10-CM | POA: Diagnosis not present

## 2015-05-11 DIAGNOSIS — E784 Other hyperlipidemia: Secondary | ICD-10-CM | POA: Diagnosis not present

## 2015-05-15 DIAGNOSIS — Z961 Presence of intraocular lens: Secondary | ICD-10-CM | POA: Diagnosis not present

## 2015-05-15 DIAGNOSIS — H00019 Hordeolum externum unspecified eye, unspecified eyelid: Secondary | ICD-10-CM | POA: Diagnosis not present

## 2015-06-03 DIAGNOSIS — H00019 Hordeolum externum unspecified eye, unspecified eyelid: Secondary | ICD-10-CM | POA: Diagnosis not present

## 2015-06-03 DIAGNOSIS — Z961 Presence of intraocular lens: Secondary | ICD-10-CM | POA: Diagnosis not present

## 2015-06-12 DIAGNOSIS — M859 Disorder of bone density and structure, unspecified: Secondary | ICD-10-CM | POA: Diagnosis not present

## 2015-06-19 ENCOUNTER — Ambulatory Visit: Payer: Medicare Other

## 2015-06-30 ENCOUNTER — Ambulatory Visit
Admission: RE | Admit: 2015-06-30 | Discharge: 2015-06-30 | Disposition: A | Discharge status: Home / Self Care | Payer: Medicare Other | Source: Ambulatory Visit

## 2015-06-30 DIAGNOSIS — Z1231 Encounter for screening mammogram for malignant neoplasm of breast: Secondary | ICD-10-CM | POA: Diagnosis not present

## 2015-06-30 DIAGNOSIS — Z9889 Other specified postprocedural states: Secondary | ICD-10-CM

## 2015-06-30 DIAGNOSIS — Z853 Personal history of malignant neoplasm of breast: Secondary | ICD-10-CM

## 2015-07-01 ENCOUNTER — Encounter: Payer: Self-pay | Admitting: Cardiology

## 2015-07-01 ENCOUNTER — Ambulatory Visit (INDEPENDENT_AMBULATORY_CARE_PROVIDER_SITE_OTHER): Payer: Medicare Other | Admitting: Cardiology

## 2015-07-01 VITALS — BP 124/64 | HR 60 | Ht 64.5 in | Wt 137.0 lb

## 2015-07-01 DIAGNOSIS — I10 Essential (primary) hypertension: Secondary | ICD-10-CM | POA: Diagnosis not present

## 2015-07-01 DIAGNOSIS — R001 Bradycardia, unspecified: Secondary | ICD-10-CM | POA: Diagnosis not present

## 2015-07-01 DIAGNOSIS — I493 Ventricular premature depolarization: Secondary | ICD-10-CM | POA: Diagnosis not present

## 2015-07-01 NOTE — Progress Notes (Signed)
Cardiology Office Note    Date:  07/01/2015   ID:  [name] [name], DOB 1930-04-21, MRN [id number]  PCP:  Jerlyn Ly, MD  Cardiologist:  Fransico Him, MD   Chief Complaint  Patient presents with  . Hypertension  . Irregular Heart Beat    History of Present Illness:  [name] [name] is a 82 y.o. female with a history of PVC's and HTN. She denies any palpitations, chest pain, SOB, DOE, LE edema, dizziness or syncope. She has had 1 episode of palpitations back in February while riding the train to [place] and lasted about 1/2 hour but has not had any since then.     Past Medical History  Diagnosis Date  . DEGENERATIVE JOINT DISEASE, KNEE   . GENU VALGUM   . 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 ROTATOR CUFF SYNDROME, LEFT     CANNOT LIFT LEFT ARM ALL THE WAY UP  . UNEQUAL LEG LENGTH   . Bradycardia     asymptomatic  . PVC's (premature ventricular contractions)   . Hypertension   . Hyperlipidemia   . COPD (chronic obstructive pulmonary disease) (HCC)     MILD, NO INHALERS USED  . Pneumonia 2013    X 2  . Anxiety   . UTI (urinary tract infection)     STARTED AUGMENTIN  ON 09-10-13  . Breast CA ([facility] [place]) 1993 OR 1994    LEFT, SURGERY AND RADIATION DONE  . Family history of anesthesia complication     NEPHEW NAUSEA/VOMITING  . Numbness of leg 2011    just left shin    Past Surgical History  Procedure Laterality Date  . Total hip arthroplasty Bilateral LEFT 2008 AND RIGHT 2010  . Breast surgery Left 1993 OR 1994    LUMPECTOMY AND RADIATION DONE  . Appendectomy  AGE 44  . Nasal sinus surgery  2006  . Total knee arthroplasty Left 09/17/2013    Procedure: LEFT TOTAL KNEE ARTHROPLASTY;  Surgeon: Mauri Pole, MD;  Location: [facility];  Service: Orthopedics;  Laterality: Left;    Current Medications: Outpatient Prescriptions Prior to Visit  Medication Sig Dispense Refill  . ANUCORT-HC 25 MG suppository Place 25 mg rectally as needed. For irritation  1  .  atorvastatin (LIPITOR) 20 MG tablet Take 20 mg by mouth every evening.     [name] BONIVA 150 MG tablet Take 150 mg by mouth every 30 (thirty) days.    . diphenoxylate-atropine (LOMOTIL) 2.5-0.025 MG per tablet Take 1 tablet by mouth 4 (four) times daily as needed for diarrhea or loose stools. 120 tablet 0  . escitalopram (LEXAPRO) 10 MG tablet Take 10 mg by mouth every morning.    . eszopiclone (LUNESTA) 2 MG TABS tablet Take 2 mg by mouth as needed for sleep.   0  . ketotifen (THERA TEARS ALLERGY) 0.025 % ophthalmic solution Place 1 drop into both eyes 2 (two) times daily as needed (Dry eyes).    [name] lisinopril (PRINIVIL,ZESTRIL) 10 MG tablet Take 1 tablet (10 mg total) by mouth daily. 90 tablet 3  . metoprolol succinate (TOPROL-XL) 50 MG 24 hr tablet TAKE 1 TABLET DAILY 90 tablet 3  . Multiple Vitamin (MULTIVITAMIN WITH MINERALS) TABS tablet Take 1 tablet by mouth daily.    [name] NEXIUM 40 MG capsule Take 40 mg by mouth as needed. For indigestion  0  . Vitamin D, Ergocalciferol, (DRISDOL) 50000 UNITS CAPS capsule Take 50,000 Units by mouth every 7 (  seven) days. Mondays    . predniSONE (DELTASONE) 5 MG tablet Take 5 mg by mouth daily. Reported on 07/01/2015  0   No facility-administered medications prior to visit.     Allergies:   Cardizem and Other   Social History   Social History  . Marital Status: Widowed    Spouse Name: N/A  . Number of Children: N/A  . Years of Education: N/A   Social History Main Topics  . Smoking status: Former Smoker -- 0.25 packs/day for 15 years    Types: Cigarettes    Quit date: 01/18/1992  . Smokeless tobacco: Never Used  . Alcohol Use: Yes     Comment: WINE 2 GLASSES PER DAY  . Drug Use: No  . Sexual Activity: Not Asked   Other Topics Concern  . None   Social History Narrative     Family History:  The patient's family history includes Diabetes in her mother; Heart Problems in her father; Hypertension in her brother.   ROS:   Please see the history of  present illness.    ROS All other systems reviewed and are negative.   PHYSICAL EXAM:   VS:  BP 124/64 mmHg  Pulse 60  Ht 5' 4.5" (1.638 m)  Wt 137 lb (62.143 kg)  BMI 23.16 kg/m2   GEN: Well nourished, well developed, in no acute distress HEENT: normal Neck: no JVD, carotid bruits, or masses Cardiac: RRR; no murmurs, rubs, or gallops,no edema.  Intact distal pulses bilaterally.  Respiratory:  clear to auscultation bilaterally, normal work of breathing GI: soft, nontender, nondistended, + BS MS: no deformity or atrophy Skin: warm and dry, no rash Neuro:  Alert and Oriented x 3, Strength and sensation are intact Psych: euthymic mood, full affect  Wt Readings from Last 3 Encounters:  07/01/15 137 lb (62.143 kg)  08/27/14 137 lb 12.8 oz (62.506 kg)  02/26/14 142 lb 6.4 oz (64.592 kg)      Studies/Labs Reviewed:   EKG:  EKG is not ordered today.    Recent Labs: No results found for requested labs within last 365 days.   Lipid Panel No results found for: CHOL, TRIG, HDL, CHOLHDL, VLDL, LDLCALC, LDLDIRECT  Additional studies/ records that were reviewed today include:  none    ASSESSMENT:    1. PVC's (premature ventricular contractions)   2. Essential hypertension   3. Bradycardia      PLAN:  In order of problems listed above:  1. PVC's - controlled on BB therapy.  Continue Toprol. 2. HTN - controlled.  Continue BB and ACE I. 3. Bradycardia - drug induced.  HR 60 today and asymptomatic.    Medication Adjustments/Labs and Tests Ordered: Current medicines are reviewed at length with the patient today.  Concerns regarding medicines are outlined above.  Medication changes, Labs and Tests ordered today are listed in the Patient Instructions below.  There are no Patient Instructions on file for this visit.   Signed, Fransico Him, MD  07/01/2015 8:54 AM    [facility], [place], [place]  [address] Phone: [phone]; Fax:  [phone]

## 2015-07-01 NOTE — Patient Instructions (Signed)

## 2015-07-06 ENCOUNTER — Other Ambulatory Visit: Payer: Self-pay | Admitting: Cardiology

## 2015-07-16 NOTE — H&P (Signed)
[name] [name] is an 82 y.o. female.    Chief Complaint: left shoulder pain  HPI: Pt is a 82 y.o. female complaining of left shoulder pain for multiple years. Pain had continually increased since the beginning. X-rays in the clinic show end-stage arthritic changes of the left shoulder. Pt has tried various conservative treatments which have failed to alleviate their symptoms, including injections and therapy. Various options are discussed with the patient. Risks, benefits and expectations were discussed with the patient. Patient understand the risks, benefits and expectations and wishes to proceed with surgery.   PCP:  Jerlyn Ly, MD  D/C Plans: Home  PMH: Past Medical History  Diagnosis Date  . DEGENERATIVE JOINT DISEASE, KNEE   . GENU VALGUM   . 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 ROTATOR CUFF SYNDROME, LEFT     CANNOT LIFT LEFT ARM ALL THE WAY UP  . UNEQUAL LEG LENGTH   . Bradycardia     asymptomatic  . PVC's (premature ventricular contractions)   . Hypertension   . Hyperlipidemia   . COPD (chronic obstructive pulmonary disease) (HCC)     MILD, NO INHALERS USED  . Pneumonia 2013    X 2  . Anxiety   . UTI (urinary tract infection)     STARTED AUGMENTIN  ON 09-10-13  . Breast CA ([place]) 1993 OR 1994    LEFT, SURGERY AND RADIATION DONE  . Family history of anesthesia complication     NEPHEW NAUSEA/VOMITING  . Numbness of leg 2011    just left shin    PSH: Past Surgical History  Procedure Laterality Date  . Total hip arthroplasty Bilateral LEFT 2008 AND RIGHT 2010  . Breast surgery Left 1993 OR 1994    LUMPECTOMY AND RADIATION DONE  . Appendectomy  AGE 28  . Nasal sinus surgery  2006  . Total knee arthroplasty Left 09/17/2013    Procedure: LEFT TOTAL KNEE ARTHROPLASTY;  Surgeon: Mauri Pole, MD;  Location: [facility];  Service: Orthopedics;  Laterality: Left;    Social History:  reports that she quit smoking about 23 years ago. Her smoking use  included Cigarettes. She has a 3.75 pack-year smoking history. She has never used smokeless tobacco. She reports that she drinks alcohol. She reports that she does not use illicit drugs.  Allergies:  Allergies  Allergen Reactions  . Cardizem [Diltiazem Hcl]     hypotension  . Other     PAIN MEDICATION CAUSES NAUSEA--? name    Medications: No current facility-administered medications for this encounter.   Current Outpatient Prescriptions  Medication Sig Dispense Refill  . ANUCORT-HC 25 MG suppository Place 25 mg rectally as needed. For irritation  1  . atorvastatin (LIPITOR) 20 MG tablet Take 20 mg by mouth every evening.     [name] BONIVA 150 MG tablet Take 150 mg by mouth every 30 (thirty) days.    . diphenoxylate-atropine (LOMOTIL) 2.5-0.025 MG per tablet Take 1 tablet by mouth 4 (four) times daily as needed for diarrhea or loose stools. 120 tablet 0  . escitalopram (LEXAPRO) 10 MG tablet Take 10 mg by mouth every morning.    . eszopiclone (LUNESTA) 2 MG TABS tablet Take 2 mg by mouth as needed for sleep.   0  . ketotifen (THERA TEARS ALLERGY) 0.025 % ophthalmic solution Place 1 drop into both eyes 2 (two) times daily as needed (Dry eyes).    [name] lisinopril (PRINIVIL,ZESTRIL) 10 MG tablet take 1 tablet  by mouth daily 90 tablet 3  . metoprolol succinate (TOPROL-XL) 50 MG 24 hr tablet TAKE 1 TABLET DAILY 90 tablet 3  . Multiple Vitamin (MULTIVITAMIN WITH MINERALS) TABS tablet Take 1 tablet by mouth daily.    [name] NEXIUM 40 MG capsule Take 40 mg by mouth as needed. For indigestion  0  . Vitamin D, Ergocalciferol, (DRISDOL) 50000 UNITS CAPS capsule Take 50,000 Units by mouth every 7 (seven) days. Mondays      No results found for this or any previous visit (from the past 48 hour(s)). No results found.  ROS: Pain with rom of the left upper extremity  Physical Exam:  Alert and oriented 82 y.o. female in no acute distress Cranial nerves 2-12 intact Cervical spine: full rom with no  tenderness, nv intact distally Chest: active breath sounds bilaterally, no wheeze rhonchi or rales Heart: regular rate and rhythm, no murmur Abd: non tender non distended with active bowel sounds Hip is stable with rom  Left shoulder with moderate limitation with rom and strength nv intact distally No rashes or edema  Assessment/Plan Assessment: left rotator cuff arthropathy  Plan: Patient will undergo a left reverse total shoulder by Dr. Veverly Fells at [facility]. Risks benefits and expectations were discussed with the patient. Patient understand risks, benefits and expectations and wishes to proceed.

## 2015-07-17 ENCOUNTER — Telehealth: Payer: Self-pay | Admitting: Cardiology

## 2015-07-17 NOTE — Telephone Encounter (Signed)
Patient reports she took her medications today, checked her BP about 1 hour later, and the reading was 153/75. Patient is asymptomatic. Reassured the patient that BP of 153/75 is not life threatening. She will check her BP 2 hours after taking BP medications over the weekend and call next week with results. She was grateful for call.

## 2015-07-17 NOTE — Telephone Encounter (Signed)
Pt has another appt and will be leaving soon-pt requesting call before she leaves 1130 if possible

## 2015-07-17 NOTE — Telephone Encounter (Signed)
New Message:   Please call,her blood pressure is up,it is 153/75.This was an hour after she had taken the medicine.

## 2015-07-23 ENCOUNTER — Encounter (HOSPITAL_COMMUNITY)
Admission: RE | Admit: 2015-07-23 | Discharge: 2015-07-23 | Disposition: A | Discharge status: Home / Self Care | Payer: Medicare Other | Source: Ambulatory Visit | Attending: Orthopedic Surgery | Admitting: Orthopedic Surgery

## 2015-07-23 ENCOUNTER — Encounter (HOSPITAL_COMMUNITY): Payer: Self-pay

## 2015-07-23 DIAGNOSIS — M19012 Primary osteoarthritis, left shoulder: Secondary | ICD-10-CM | POA: Insufficient documentation

## 2015-07-23 DIAGNOSIS — Z7983 Long term (current) use of bisphosphonates: Secondary | ICD-10-CM | POA: Insufficient documentation

## 2015-07-23 DIAGNOSIS — E785 Hyperlipidemia, unspecified: Secondary | ICD-10-CM | POA: Diagnosis not present

## 2015-07-23 DIAGNOSIS — Z01812 Encounter for preprocedural laboratory examination: Secondary | ICD-10-CM | POA: Insufficient documentation

## 2015-07-23 DIAGNOSIS — Z79899 Other long term (current) drug therapy: Secondary | ICD-10-CM | POA: Insufficient documentation

## 2015-07-23 DIAGNOSIS — Z87891 Personal history of nicotine dependence: Secondary | ICD-10-CM | POA: Diagnosis not present

## 2015-07-23 DIAGNOSIS — J449 Chronic obstructive pulmonary disease, unspecified: Secondary | ICD-10-CM | POA: Insufficient documentation

## 2015-07-23 DIAGNOSIS — I10 Essential (primary) hypertension: Secondary | ICD-10-CM | POA: Diagnosis not present

## 2015-07-23 LAB — CBC
HEMATOCRIT: 39 % (ref 36.0–46.0)
HEMOGLOBIN: 12.6 g/dL (ref 12.0–15.0)
MCH: 32.2 pg (ref 26.0–34.0)
MCHC: 32.3 g/dL (ref 30.0–36.0)
MCV: 99.7 fL (ref 78.0–100.0)
Platelets: 193 10*3/uL (ref 150–400)
RBC: 3.91 MIL/uL (ref 3.87–5.11)
RDW: 12.7 % (ref 11.5–15.5)
WBC: 6.3 10*3/uL (ref 4.0–10.5)

## 2015-07-23 LAB — BASIC METABOLIC PANEL
Anion gap: 4 — ABNORMAL LOW (ref 5–15)
BUN: 19 mg/dL (ref 6–20)
CALCIUM: 9.1 mg/dL (ref 8.9–10.3)
CHLORIDE: 106 mmol/L (ref 101–111)
CO2: 28 mmol/L (ref 22–32)
CREATININE: 0.88 mg/dL (ref 0.44–1.00)
GFR calc non Af Amer: 59 mL/min — ABNORMAL LOW (ref >60)
Glucose, Bld: 108 mg/dL — ABNORMAL HIGH (ref 65–99)
Potassium: 4 mmol/L (ref 3.5–5.1)
SODIUM: 138 mmol/L (ref 135–145)

## 2015-07-23 LAB — SURGICAL PCR SCREEN
MRSA, PCR: NEGATIVE
STAPHYLOCOCCUS AUREUS: NEGATIVE

## 2015-07-23 NOTE — Pre-Procedure Instructions (Signed)
    FRANCOISE EGGETT  07/23/2015      [facility], [place] [address] Phone: [phone] Fax: [phone]  [place], [place] [address] Phone: [phone] Fax: [phone]    Your procedure is scheduled on 07/31/15.  Report to [facility] Admitting at 7 A.M.  Call this number if you have problems the morning of surgery:  [phone]   Remember:  Do not eat food or drink liquids after midnight.  Take these medicines the morning of surgery with A SIP OF WATER --all inhalers,lexapro,metoprolol,singular,nexium   Do not wear jewelry, make-up or nail polish.  Do not wear lotions, powders, or perfumes.  You may wear deoderant.  Do not shave 48 hours prior to surgery.  Men may shave face and neck.  Do not bring valuables to the hospital.  [facility] is not responsible for any belongings or valuables.  Contacts, dentures or bridgework may not be worn into surgery.  Leave your suitcase in the car.  After surgery it may be brought to your room.  For patients admitted to the hospital, discharge time will be determined by your treatment team.  Patients discharged the day of surgery will not be allowed to drive home.   Name and phone number of your driver:    Special instructions:   Please read over the following fact sheets that you were given. MRSA Information

## 2015-07-31 ENCOUNTER — Encounter (HOSPITAL_COMMUNITY)
Admission: RE | Disposition: A | Discharge status: Home Health | Payer: Self-pay | Source: Ambulatory Visit | Attending: Orthopedic Surgery

## 2015-07-31 ENCOUNTER — Inpatient Hospital Stay (HOSPITAL_COMMUNITY): Payer: Medicare Other | Admitting: Anesthesiology

## 2015-07-31 ENCOUNTER — Encounter (HOSPITAL_COMMUNITY): Payer: Self-pay | Admitting: Surgery

## 2015-07-31 ENCOUNTER — Inpatient Hospital Stay (HOSPITAL_COMMUNITY): Payer: Medicare Other

## 2015-07-31 ENCOUNTER — Inpatient Hospital Stay (HOSPITAL_COMMUNITY)
Admission: RE | Admit: 2015-07-31 | Discharge: 2015-08-01 | DRG: 483 | Disposition: A | Discharge status: Home Health | Payer: Medicare Other | Source: Ambulatory Visit | Attending: Orthopedic Surgery | Admitting: Orthopedic Surgery

## 2015-07-31 DIAGNOSIS — J449 Chronic obstructive pulmonary disease, unspecified: Secondary | ICD-10-CM | POA: Diagnosis present

## 2015-07-31 DIAGNOSIS — G8918 Other acute postprocedural pain: Secondary | ICD-10-CM | POA: Diagnosis not present

## 2015-07-31 DIAGNOSIS — Z853 Personal history of malignant neoplasm of breast: Secondary | ICD-10-CM

## 2015-07-31 DIAGNOSIS — F419 Anxiety disorder, unspecified: Secondary | ICD-10-CM | POA: Diagnosis present

## 2015-07-31 DIAGNOSIS — E785 Hyperlipidemia, unspecified: Secondary | ICD-10-CM | POA: Diagnosis present

## 2015-07-31 DIAGNOSIS — Z96643 Presence of artificial hip joint, bilateral: Secondary | ICD-10-CM | POA: Diagnosis present

## 2015-07-31 DIAGNOSIS — M19012 Primary osteoarthritis, left shoulder: Secondary | ICD-10-CM | POA: Diagnosis present

## 2015-07-31 DIAGNOSIS — Z96612 Presence of left artificial shoulder joint: Secondary | ICD-10-CM

## 2015-07-31 DIAGNOSIS — Z96619 Presence of unspecified artificial shoulder joint: Secondary | ICD-10-CM

## 2015-07-31 DIAGNOSIS — Z87891 Personal history of nicotine dependence: Secondary | ICD-10-CM | POA: Diagnosis not present

## 2015-07-31 DIAGNOSIS — I10 Essential (primary) hypertension: Secondary | ICD-10-CM | POA: Diagnosis present

## 2015-07-31 DIAGNOSIS — Z79899 Other long term (current) drug therapy: Secondary | ICD-10-CM

## 2015-07-31 DIAGNOSIS — Z471 Aftercare following joint replacement surgery: Secondary | ICD-10-CM | POA: Diagnosis not present

## 2015-07-31 DIAGNOSIS — Z96652 Presence of left artificial knee joint: Secondary | ICD-10-CM | POA: Diagnosis present

## 2015-07-31 DIAGNOSIS — M25512 Pain in left shoulder: Secondary | ICD-10-CM | POA: Diagnosis not present

## 2015-07-31 HISTORY — PX: REVERSE SHOULDER ARTHROPLASTY: SHX5054

## 2015-07-31 SURGERY — ARTHROPLASTY, SHOULDER, TOTAL, REVERSE
Anesthesia: General | Site: Shoulder | Laterality: Left

## 2015-07-31 MED ORDER — FENTANYL CITRATE (PF) 100 MCG/2ML IJ SOLN
INTRAMUSCULAR | Status: AC
  Administered 2015-07-31: 50 ug via INTRAVENOUS
  Filled 2015-07-31: qty 2

## 2015-07-31 MED ORDER — METOCLOPRAMIDE HCL 5 MG/ML IJ SOLN: 5.0000 mg | Freq: Three times a day (TID) | INTRAMUSCULAR | Status: DC | PRN

## 2015-07-31 MED ORDER — ONDANSETRON HCL 4 MG/2ML IJ SOLN: 4.0000 mg | Freq: Four times a day (QID) | INTRAMUSCULAR | Status: DC | PRN

## 2015-07-31 MED ORDER — CHLORHEXIDINE GLUCONATE 4 % EX LIQD: 60.0000 mL | Freq: Once | CUTANEOUS | Status: DC

## 2015-07-31 MED ORDER — FENTANYL CITRATE (PF) 100 MCG/2ML IJ SOLN
INTRAMUSCULAR | Status: DC | PRN
  Administered 2015-07-31: 100 ug via INTRAVENOUS

## 2015-07-31 MED ORDER — LIDOCAINE HCL (CARDIAC) 20 MG/ML IV SOLN
INTRAVENOUS | Status: DC | PRN
  Administered 2015-07-31: 30 mg via INTRAVENOUS

## 2015-07-31 MED ORDER — SENNA 8.6 MG PO TABS
1.0000 | ORAL_TABLET | Freq: Two times a day (BID) | ORAL | Status: DC
  Administered 2015-07-31 – 2015-08-01 (×3): 8.6 mg via ORAL
  Filled 2015-07-31 (×2): qty 1

## 2015-07-31 MED ORDER — MENTHOL 3 MG MT LOZG: 1.0000 | LOZENGE | OROMUCOSAL | Status: DC | PRN

## 2015-07-31 MED ORDER — ACETAMINOPHEN 325 MG PO TABS
650.0000 mg | ORAL_TABLET | Freq: Four times a day (QID) | ORAL | Status: DC | PRN
  Administered 2015-08-01: 650 mg via ORAL
  Filled 2015-07-31: qty 2

## 2015-07-31 MED ORDER — NEOSTIGMINE METHYLSULFATE 10 MG/10ML IV SOLN
INTRAVENOUS | Status: DC | PRN
  Administered 2015-07-31: 3 mg via INTRAVENOUS

## 2015-07-31 MED ORDER — CEFAZOLIN SODIUM-DEXTROSE 2-4 GM/100ML-% IV SOLN
2.0000 g | Freq: Four times a day (QID) | INTRAVENOUS | Status: AC
  Administered 2015-07-31 – 2015-08-01 (×3): 2 g via INTRAVENOUS
  Filled 2015-07-31 (×3): qty 100

## 2015-07-31 MED ORDER — 0.9 % SODIUM CHLORIDE (POUR BTL) OPTIME
TOPICAL | Status: DC | PRN
  Administered 2015-07-31: 1000 mL

## 2015-07-31 MED ORDER — MORPHINE SULFATE (PF) 2 MG/ML IV SOLN: 1.0000 mg | INTRAVENOUS | Status: DC | PRN

## 2015-07-31 MED ORDER — ACETAMINOPHEN 325 MG PO TABS: 325.0000 mg | ORAL_TABLET | ORAL | Status: DC | PRN

## 2015-07-31 MED ORDER — DEXTROSE 5 % IV SOLN
10.0000 mg | INTRAVENOUS | Status: DC | PRN
  Administered 2015-07-31: 50 ug/min via INTRAVENOUS

## 2015-07-31 MED ORDER — PHENYLEPHRINE HCL 10 MG/ML IJ SOLN
INTRAMUSCULAR | Status: DC | PRN
  Administered 2015-07-31: 80 ug via INTRAVENOUS

## 2015-07-31 MED ORDER — EPHEDRINE SULFATE 50 MG/ML IJ SOLN
INTRAMUSCULAR | Status: DC | PRN
  Administered 2015-07-31: 5 mg via INTRAVENOUS

## 2015-07-31 MED ORDER — MIDAZOLAM HCL 2 MG/2ML IJ SOLN
INTRAMUSCULAR | Status: AC
  Filled 2015-07-31: qty 2

## 2015-07-31 MED ORDER — POLYETHYLENE GLYCOL 3350 17 G PO PACK: 17.0000 g | PACK | Freq: Every day | ORAL | Status: DC | PRN

## 2015-07-31 MED ORDER — OXYCODONE HCL 5 MG PO TABS: 5.0000 mg | ORAL_TABLET | Freq: Once | ORAL | Status: DC | PRN

## 2015-07-31 MED ORDER — METOCLOPRAMIDE HCL 5 MG PO TABS: 5.0000 mg | ORAL_TABLET | Freq: Three times a day (TID) | ORAL | Status: DC | PRN

## 2015-07-31 MED ORDER — LACTATED RINGERS IV SOLN
INTRAVENOUS | Status: DC
  Administered 2015-07-31 (×2): via INTRAVENOUS

## 2015-07-31 MED ORDER — PHENOL 1.4 % MT LIQD
1.0000 | OROMUCOSAL | Status: DC | PRN
Start: 2015-07-31 — End: 2015-08-01

## 2015-07-31 MED ORDER — ONDANSETRON HCL 4 MG PO TABS: 4.0000 mg | ORAL_TABLET | Freq: Four times a day (QID) | ORAL | Status: DC | PRN

## 2015-07-31 MED ORDER — HYDROCODONE-ACETAMINOPHEN 5-325 MG PO TABS: 1.0000 | ORAL_TABLET | ORAL | Status: DC | PRN

## 2015-07-31 MED ORDER — FENTANYL CITRATE (PF) 100 MCG/2ML IJ SOLN
50.0000 ug | Freq: Once | INTRAMUSCULAR | Status: AC
  Administered 2015-07-31: 50 ug via INTRAVENOUS

## 2015-07-31 MED ORDER — BUPIVACAINE-EPINEPHRINE (PF) 0.5% -1:200000 IJ SOLN
INTRAMUSCULAR | Status: DC | PRN
  Administered 2015-07-31: 25 mL via PERINEURAL

## 2015-07-31 MED ORDER — ACETAMINOPHEN 160 MG/5ML PO SOLN: 325.0000 mg | ORAL | Status: DC | PRN

## 2015-07-31 MED ORDER — HYDROMORPHONE HCL 1 MG/ML IJ SOLN: 0.2500 mg | INTRAMUSCULAR | Status: DC | PRN

## 2015-07-31 MED ORDER — FENTANYL CITRATE (PF) 250 MCG/5ML IJ SOLN
INTRAMUSCULAR | Status: AC
  Filled 2015-07-31: qty 5

## 2015-07-31 MED ORDER — SODIUM CHLORIDE 0.9 % IV SOLN
INTRAVENOUS | Status: DC
  Administered 2015-07-31: 15:00:00 via INTRAVENOUS

## 2015-07-31 MED ORDER — ONDANSETRON HCL 4 MG/2ML IJ SOLN
INTRAMUSCULAR | Status: DC | PRN
  Administered 2015-07-31: 4 mg via INTRAVENOUS

## 2015-07-31 MED ORDER — OXYCODONE HCL 5 MG/5ML PO SOLN: 5.0000 mg | Freq: Once | ORAL | Status: DC | PRN

## 2015-07-31 MED ORDER — PROPOFOL 10 MG/ML IV BOLUS
INTRAVENOUS | Status: DC | PRN
  Administered 2015-07-31: 100 mg via INTRAVENOUS

## 2015-07-31 MED ORDER — CEFAZOLIN SODIUM-DEXTROSE 2-4 GM/100ML-% IV SOLN
2.0000 g | INTRAVENOUS | Status: AC
  Administered 2015-07-31: 2 g via INTRAVENOUS
  Filled 2015-07-31: qty 100

## 2015-07-31 MED ORDER — GLYCOPYRROLATE 0.2 MG/ML IJ SOLN
INTRAMUSCULAR | Status: DC | PRN
  Administered 2015-07-31: .5 mg via INTRAVENOUS

## 2015-07-31 MED ORDER — ACETAMINOPHEN 650 MG RE SUPP: 650.0000 mg | Freq: Four times a day (QID) | RECTAL | Status: DC | PRN

## 2015-07-31 MED ORDER — BUPIVACAINE-EPINEPHRINE 0.25% -1:200000 IJ SOLN
INTRAMUSCULAR | Status: DC | PRN
  Administered 2015-07-31: 8 mL

## 2015-07-31 MED ORDER — HYDROCODONE-ACETAMINOPHEN 5-325 MG PO TABS: 1.0000 | ORAL_TABLET | Freq: Four times a day (QID) | ORAL | Status: DC | PRN

## 2015-07-31 MED ORDER — ACETAMINOPHEN 500 MG PO TABS
1000.0000 mg | ORAL_TABLET | Freq: Four times a day (QID) | ORAL | Status: AC
Start: 2015-07-31 — End: 2015-08-01
  Administered 2015-07-31 – 2015-08-01 (×3): 1000 mg via ORAL
  Filled 2015-07-31 (×4): qty 2

## 2015-07-31 MED ORDER — BUPIVACAINE-EPINEPHRINE (PF) 0.25% -1:200000 IJ SOLN
INTRAMUSCULAR | Status: AC
  Filled 2015-07-31: qty 30

## 2015-07-31 MED ORDER — ROCURONIUM BROMIDE 100 MG/10ML IV SOLN
INTRAVENOUS | Status: DC | PRN
  Administered 2015-07-31: 30 mg via INTRAVENOUS

## 2015-07-31 SURGICAL SUPPLY — 68 items
BIT DRILL 170X2.5X (BIT) IMPLANT
BIT DRILL 5/64X5 DISP (BIT) ×3 IMPLANT
BIT DRL 170X2.5X (BIT)
BLADE SAG 18X100X1.27 (BLADE) ×3 IMPLANT
CAPT SHLDR REVTOTAL 1 ×2 IMPLANT
CLOSURE WOUND 1/2 X4 (GAUZE/BANDAGES/DRESSINGS) ×1
COVER SURGICAL LIGHT HANDLE (MISCELLANEOUS) ×3 IMPLANT
DRAPE IMP U-DRAPE 54X76 (DRAPES) ×6 IMPLANT
DRAPE INCISE IOBAN 66X45 STRL (DRAPES) ×3 IMPLANT
DRAPE ORTHO SPLIT 77X108 STRL (DRAPES) ×6
DRAPE SURG ORHT 6 SPLT 77X108 (DRAPES) ×2 IMPLANT
DRAPE U-SHAPE 47X51 STRL (DRAPES) ×3 IMPLANT
DRAPE X-RAY CASS 24X20 (DRAPES) IMPLANT
DRILL 2.5 (BIT)
DRSG ADAPTIC 3X8 NADH LF (GAUZE/BANDAGES/DRESSINGS) ×3 IMPLANT
DRSG PAD ABDOMINAL 8X10 ST (GAUZE/BANDAGES/DRESSINGS) ×3 IMPLANT
DURAPREP 26ML APPLICATOR (WOUND CARE) ×3 IMPLANT
ELECT BLADE 4.0 EZ CLEAN MEGAD (MISCELLANEOUS) ×3
ELECT NDL TIP 2.8 STRL (NEEDLE) ×1 IMPLANT
ELECT NEEDLE TIP 2.8 STRL (NEEDLE) ×3 IMPLANT
ELECT REM PT RETURN 9FT ADLT (ELECTROSURGICAL) ×3
ELECTRODE BLDE 4.0 EZ CLN MEGD (MISCELLANEOUS) ×1 IMPLANT
ELECTRODE REM PT RTRN 9FT ADLT (ELECTROSURGICAL) ×1 IMPLANT
GAUZE SPONGE 4X4 12PLY STRL (GAUZE/BANDAGES/DRESSINGS) ×3 IMPLANT
GLOVE BIOGEL PI ORTHO PRO 7.5 (GLOVE) ×2
GLOVE BIOGEL PI ORTHO PRO SZ8 (GLOVE) ×2
GLOVE ORTHO TXT STRL SZ7.5 (GLOVE) ×3 IMPLANT
GLOVE PI ORTHO PRO STRL 7.5 (GLOVE) ×1 IMPLANT
GLOVE PI ORTHO PRO STRL SZ8 (GLOVE) ×1 IMPLANT
GLOVE SURG ORTHO 8.5 STRL (GLOVE) ×3 IMPLANT
GOWN STRL REUS W/ TWL LRG LVL3 (GOWN DISPOSABLE) ×1 IMPLANT
GOWN STRL REUS W/ TWL XL LVL3 (GOWN DISPOSABLE) ×2 IMPLANT
GOWN STRL REUS W/TWL LRG LVL3 (GOWN DISPOSABLE) ×3
GOWN STRL REUS W/TWL XL LVL3 (GOWN DISPOSABLE) ×6
HANDPIECE INTERPULSE COAX TIP (DISPOSABLE)
KIT BASIN OR (CUSTOM PROCEDURE TRAY) ×3 IMPLANT
KIT ROOM TURNOVER OR (KITS) ×3 IMPLANT
MANIFOLD NEPTUNE II (INSTRUMENTS) ×3 IMPLANT
NDL 1/2 CIR MAYO (NEEDLE) ×1 IMPLANT
NDL HYPO 25GX1X1/2 BEV (NEEDLE) ×1 IMPLANT
NEEDLE 1/2 CIR MAYO (NEEDLE) ×3 IMPLANT
NEEDLE HYPO 25GX1X1/2 BEV (NEEDLE) ×3 IMPLANT
NS IRRIG 1000ML POUR BTL (IV SOLUTION) ×3 IMPLANT
PACK SHOULDER (CUSTOM PROCEDURE TRAY) ×3 IMPLANT
PAD ARMBOARD 7.5X6 YLW CONV (MISCELLANEOUS) ×6 IMPLANT
PIN METAGLENE 2.5 (PIN) IMPLANT
SET HNDPC FAN SPRY TIP SCT (DISPOSABLE) IMPLANT
SLING ARM FOAM STRAP MED (SOFTGOODS) ×2 IMPLANT
SLING ARM LRG ADULT FOAM STRAP (SOFTGOODS) IMPLANT
SLING ARM MED ADULT FOAM STRAP (SOFTGOODS) IMPLANT
SPONGE LAP 18X18 X RAY DECT (DISPOSABLE) IMPLANT
SPONGE LAP 4X18 X RAY DECT (DISPOSABLE) ×3 IMPLANT
STRIP CLOSURE SKIN 1/2X4 (GAUZE/BANDAGES/DRESSINGS) ×2 IMPLANT
SUCTION FRAZIER HANDLE 10FR (MISCELLANEOUS) ×2
SUCTION TUBE FRAZIER 10FR DISP (MISCELLANEOUS) ×1 IMPLANT
SUT FIBERWIRE #2 38 T-5 BLUE (SUTURE) ×6
SUT MNCRL AB 4-0 PS2 18 (SUTURE) ×3 IMPLANT
SUT VIC AB 2-0 CT1 27 (SUTURE) ×3
SUT VIC AB 2-0 CT1 TAPERPNT 27 (SUTURE) ×1 IMPLANT
SUT VICRYL 0 CT 1 36IN (SUTURE) ×3 IMPLANT
SUTURE FIBERWR #2 38 T-5 BLUE (SUTURE) ×2 IMPLANT
SYR CONTROL 10ML LL (SYRINGE) ×3 IMPLANT
TOWEL OR 17X24 6PK STRL BLUE (TOWEL DISPOSABLE) ×3 IMPLANT
TOWEL OR 17X26 10 PK STRL BLUE (TOWEL DISPOSABLE) ×3 IMPLANT
TOWER CARTRIDGE SMART MIX (DISPOSABLE) IMPLANT
TRAY FOLEY CATH 16FRSI W/METER (SET/KITS/TRAYS/PACK) IMPLANT
WATER STERILE IRR 1000ML POUR (IV SOLUTION) ×3 IMPLANT
YANKAUER SUCT BULB TIP NO VENT (SUCTIONS) ×3 IMPLANT

## 2015-07-31 NOTE — Op Note (Signed)
NAME:  [name] [name], [name] [name]              ACCOUNT NO.:  [id number]  MEDICAL RECORD NO.:  [id number]  LOCATION:  [facility]                         FACILITY:  [facility]  PHYSICIAN:  Doran Heater. Veverly Fells, M.D. DATE OF BIRTH:  October 06, 1930  DATE OF PROCEDURE:  07/31/2015 DATE OF DISCHARGE:                              OPERATIVE REPORT   PREOPERATIVE DIAGNOSIS:  Left shoulder end-stage arthritis with rotator cuff insufficiency.  POSTOPERATIVE DIAGNOSIS:  Left shoulder end-stage arthritis with rotator cuff insufficiency.  PROCEDURE PERFORMED:  Left shoulder reverse total shoulder arthroplasty using DePuy Delta Xtend prosthesis.  ATTENDING SURGEON:  Doran Heater. Veverly Fells, MD.  ASSISTANT:  Abbott Pao. Dixon, PA-C, who was scrubbed the entire procedure and necessary for satisfactory completion of surgery.  ANESTHESIA:  General anesthesia was used plus interscalene block.  ESTIMATED BLOOD LOSS:  Minimal.  FLUID REPLACEMENT:  1200 mL crystalloid.  INSTRUMENT COUNTS:  Correct.  COMPLICATIONS:  There were no complications.  ANTIBIOTICS:  Perioperative antibiotics were given.  INDICATIONS:  The patient is an 82 year old female with worsening left shoulder pain and dysfunction secondary to rotator cuff tear arthropathy.  The patient has had progressive pain despite conservative management and also significant functional loss.  This is now interfering with ADLs and impacting her independence.  We discussed the options for management with the patient including continued conservative management versus surgical treatment with reverse shoulder arthroplasty. The patient elected to proceed with surgery to restore function, eliminate pain and informed consent obtained.  DESCRIPTION OF PROCEDURE:  After an adequate level of anesthesia achieved, the patient was positioned in the modified beach-chair position.  Left shoulder was correctly identified, sterilely prepped and draped in usual manner.  Time-out was called.  We  entered the shoulder using standard deltopectoral incision started at the coracoid process, extending down to the anterior humerus.  Dissection carried out through the subcutaneous tissues using Bovie electrocautery.  Cephalic vein identified, taken laterally with the deltoid, pectoralis taken medially. Conjoint tendon identified and retracted medially.  The biceps was tenodesed through the pectoralis insertion.  We then released the subscapularis subperiosteally off the lesser tuberosity.  This subscapularis was attenuated and thinned, and we went ahead and whipstitched with #2 FiberWire suture just to provide protection for the axillary nerve.  We retracted the soft tissue sleeve/subscapularis off the medial humerus and down through to the inferior humeral neck.  We then released the biceps tendon and placed the Brown retractor, extending the shoulder and then released the remaining rotator cuff, which was in poor condition.  We did preserve the teres minor, that muscle felt may help the patient functionally.  We then went ahead and entered the proximal humerus with a 6-mm reamer.  We reamed up to a size 12 and then placed our intramedullary head resection guide.  We resected the head in 10 degrees of retroversion and then removed excess osteophytes and then did our metaphyseal preparation with the drill and reamed with the metaphyseal reamer for the epi-1 left metaphyseal component.  We then attached the epi-1 left metaphysis to the 12 body and impacted that in position in 10 degrees of retroversion.  We then reduced the shoulder and subluxed it posteriorly, we removed the  remaining portion of the supraspinatus and infraspinatus tendon remnant and left the teres minor intact.  We removed the biceps and the labrum, we gained 360-degree exposure, there was significant retroversion noted and thinning of the glenoid.  We were careful with our preparation, find the center point with our  guidepin and then reaming gently down to subchondral bone.  We drilled our central peg hole and impacted the metaglene in position in a good position inferiorly on the glenoid and then was able to get a 48 screw inferiorly in the inter-table between the anterior portion and posterior portion of the scapular body.  We then placed a 30 at the base of the coracoid.  These were both lock screws and an 18 nonlocked anteriorly.  We did not have bone that to be place one posteriorly.  We had a well-fixed metaglene and then placed a 38 standard glenosphere and positioned, impacted that and screwed into place.  We were careful to protect the axillary nerve.  We then went ahead and reduced the shoulder with initially a 38+ 3 poly, felt like we could probably get a +6.  We then removed the trial components, irrigated the humeral shaft well and then using impaction grafting technique with bone graft from the humeral head, we impacted the HA- coated 12 body epi-1 left metaphysis, set on 0 and place in 10 degrees retroversion and impacted that in position with great purchase and stability.  We then used a 38+ 6 real poly, impacted that in place, reduced the shoulder with a nice little snap and good stability both in neutral, and extension and external rotation.  We checked the axillary nerve that was not under undue tension.  We irrigated thoroughly, the subscap could not be repaired, we removed that remaining tissue and then went ahead and did our deltopectoral repair with 0 Vicryl suture followed by 2-0 Vicryl for subcutaneous closure and 4-0 Monocryl for skin.  Steri-Strips applied followed by a sterile dressing.  The patient tolerated the surgery well.     Doran Heater. Veverly Fells, M.D.     SRN/MEDQ  D:  07/31/2015  T:  07/31/2015  Job:  [id number]

## 2015-07-31 NOTE — Brief Op Note (Signed)
07/31/2015  10:26 AM  PATIENT:  Elgie Congo Langston  82 y.o. female  PRE-OPERATIVE DIAGNOSIS:  End stage shoulder OA, rotator cuff insufficiency  POST-OPERATIVE DIAGNOSIS:  End stage shoulder OA, rotator cuff insufficiency  PROCEDURE:  Procedure(s): LEFT REVERSE SHOULDER ARTHROPLASTY (Left) DePuy Delta Xtend  SURGEON:  Surgeon(s) and Role:    * Netta Cedars, MD - Primary  PHYSICIAN ASSISTANT:   ASSISTANTS: Ventura Bruns, PA-C   ANESTHESIA:   regional and general  EBL:  Total I/O In: 1000 [I.V.:1000] Out: 100 [Blood:100]  BLOOD ADMINISTERED:none  DRAINS: none   LOCAL MEDICATIONS USED:  MARCAINE     SPECIMEN:  No Specimen  DISPOSITION OF SPECIMEN:  N/A  COUNTS:  YES  TOURNIQUET:  * No tourniquets in log *  DICTATION: .Other Dictation: Dictation Number [id number]  PLAN OF CARE: Admit to inpatient   PATIENT DISPOSITION:  PACU - hemodynamically stable.   Delay start of Pharmacological VTE agent (>24hrs) due to surgical blood loss or risk of bleeding: not applicable

## 2015-07-31 NOTE — Anesthesia Postprocedure Evaluation (Signed)
Anesthesia Post Note  Patient: [name] [name]  Procedure(s) Performed: Procedure(s) (LRB): LEFT REVERSE SHOULDER ARTHROPLASTY (Left)  Patient location during evaluation: PACU Anesthesia Type: General and Regional Level of consciousness: awake Pain management: pain level controlled Vital Signs Assessment: post-procedure vital signs reviewed and stable Respiratory status: spontaneous breathing Cardiovascular status: stable Postop Assessment: no signs of nausea or vomiting Anesthetic complications: no    Last Vitals:  Filed Vitals:   07/31/15 1330 07/31/15 1347  BP:  127/56  Pulse: 65 62  Temp: 36.7 C 36.4 C  Resp: 21     Last Pain:  Filed Vitals:   07/31/15 1414  PainSc: 0-No pain                 Regina Coppolino

## 2015-07-31 NOTE — Transfer of Care (Signed)
Immediate Anesthesia Transfer of Care Note  Patient: [name] [name]  Procedure(s) Performed: Procedure(s): LEFT REVERSE SHOULDER ARTHROPLASTY (Left)  Patient Location: PACU  Anesthesia Type:GA combined with regional for post-op pain  Level of Consciousness: awake, alert  and oriented  Airway & Oxygen Therapy: Patient Spontanous Breathing and Patient connected to nasal cannula oxygen  Post-op Assessment: Report given to RN and Post -op Vital signs reviewed and stable  Post vital signs: Reviewed and stable  Last Vitals:  Filed Vitals:   07/31/15 0830 07/31/15 0835  BP: 163/68 177/84  Pulse: 58 64  Temp:    Resp: 20 19    Last Pain: There were no vitals filed for this visit.    Patients Stated Pain Goal: 3 ([phone])  Complications: No apparent anesthesia complications

## 2015-07-31 NOTE — Discharge Instructions (Signed)
Ice to the shoulder as much as possible.  May remove the sling as you would like to.  If needing to rest the arm, can hug a pillow.  Do instructed exercises on your own every hour to promote circulation for the arm.    Keep the arm in front of you so that you can see your elbow.  Keep the incision clean and dry and covered for one week, then ok to get it wet in the shower.  Do not push up out of the chair with the operative arm  Follow up in two weeks in the office  [phone]

## 2015-07-31 NOTE — Interval H&P Note (Signed)
History and Physical Interval Note:  07/31/2015 8:07 AM  [name] [name]  has presented today for surgery, with the diagnosis of arthritis  The various methods of treatment have been discussed with the patient and family. After consideration of risks, benefits and other options for treatment, the patient has consented to  Procedure(s): LEFT REVERSE SHOULDER ARTHROPLASTY (Left) as a surgical intervention .  The patient's history has been reviewed, patient examined, no change in status, stable for surgery.  I have reviewed the patient's chart and labs.  Questions were answered to the patient's satisfaction.     Phebe Dettmer,STEVEN R

## 2015-07-31 NOTE — Anesthesia Preprocedure Evaluation (Addendum)
Anesthesia Evaluation  Patient identified by MRN, date of birth, ID band Patient awake    Reviewed: Allergy & Precautions, H&P , NPO status , Patient's Chart, lab work & pertinent test results  History of Anesthesia Complications Negative for: history of anesthetic complications  Airway Mallampati: II  TM Distance: >3 FB Neck ROM: Full    Dental no notable dental hx.    Pulmonary pneumonia, COPD, former smoker,    Pulmonary exam normal breath sounds clear to auscultation       Cardiovascular hypertension, Pt. on medications Normal cardiovascular exam Rhythm:Regular Rate:Normal     Neuro/Psych negative neurological ROS  negative psych ROS   GI/Hepatic negative GI ROS, Neg liver ROS,   Endo/Other  negative endocrine ROS  Renal/GU negative Renal ROS     Musculoskeletal  (+) Arthritis , Osteoarthritis,    Abdominal   Peds  Hematology negative hematology ROS (+)   Anesthesia Other Findings   Reproductive/Obstetrics negative OB ROS                            Anesthesia Physical  Anesthesia Plan  ASA: III  Anesthesia Plan: General and Regional   Post-op Pain Management: GA combined w/ Regional for post-op pain   Induction: Intravenous  Airway Management Planned: Oral ETT  Additional Equipment:   Intra-op Plan:   Post-operative Plan: Extubation in OR  Informed Consent: I have reviewed the patients History and Physical, chart, labs and discussed the procedure including the risks, benefits and alternatives for the proposed anesthesia with the patient or authorized representative who has indicated his/her understanding and acceptance.   Dental advisory given  Plan Discussed with: Anesthesiologist and Surgeon  Anesthesia Plan Comments:        Anesthesia Quick Evaluation

## 2015-07-31 NOTE — Anesthesia Procedure Notes (Addendum)
Procedure Name: Intubation Date/Time: 07/31/2015 8:51 AM Performed by: Maryland Pink Pre-anesthesia Checklist: Patient identified, Emergency Drugs available, Suction available, Patient being monitored and Timeout performed Patient Re-evaluated:Patient Re-evaluated prior to inductionOxygen Delivery Method: Circle system utilized Preoxygenation: Pre-oxygenation with 100% oxygen Intubation Type: IV induction Ventilation: Mask ventilation without difficulty Laryngoscope Size: Mac and 4 Grade View: Grade I Tube type: Oral Tube size: 7.0 mm Number of attempts: 1 Airway Equipment and Method: Stylet and LTA kit utilized Placement Confirmation: ETT inserted through vocal cords under direct vision,  positive ETCO2 and breath sounds checked- equal and bilateral Secured at: 20 cm Tube secured with: Tape Dental Injury: Teeth and Oropharynx as per pre-operative assessment    Anesthesia Regional Block:  Interscalene brachial plexus block  Pre-Anesthetic Checklist: ,, timeout performed, Correct Patient, Correct Site, Correct Laterality, Correct Procedure, Correct Position, site marked, Risks and benefits discussed,  Surgical consent,  Pre-op evaluation,  At surgeon's request and post-op pain management  Laterality: Upper and Left  Prep: chloraprep       Needles:  Injection technique: Single-shot  Needle Type: Echogenic Stimulator Needle          Additional Needles:  Procedures: ultrasound guided (picture in chart) Interscalene brachial plexus block Narrative:  Injection made incrementally with aspirations every 5 mL.  Performed by: Personally  Anesthesiologist: Caraline Deutschman  Additional Notes: H+P and labs reviewed, risks and benefits discussed with patient, procedure tolerated well without complications

## 2015-07-31 NOTE — Progress Notes (Signed)
Upper denture plate ret to pt & put in mouth.

## 2015-08-01 LAB — BASIC METABOLIC PANEL
Anion gap: 7 (ref 5–15)
BUN: 8 mg/dL (ref 6–20)
CHLORIDE: 102 mmol/L (ref 101–111)
CO2: 30 mmol/L (ref 22–32)
Calcium: 8.7 mg/dL — ABNORMAL LOW (ref 8.9–10.3)
Creatinine, Ser: 0.76 mg/dL (ref 0.44–1.00)
GFR calc Af Amer: >60 mL/min (ref >60)
GFR calc non Af Amer: >60 mL/min (ref >60)
Glucose, Bld: 101 mg/dL — ABNORMAL HIGH (ref 65–99)
POTASSIUM: 4.1 mmol/L (ref 3.5–5.1)
SODIUM: 139 mmol/L (ref 135–145)

## 2015-08-01 LAB — HEMOGLOBIN AND HEMATOCRIT, BLOOD
HCT: 32.5 % — ABNORMAL LOW (ref 36.0–46.0)
Hemoglobin: 10.6 g/dL — ABNORMAL LOW (ref 12.0–15.0)

## 2015-08-01 NOTE — Progress Notes (Signed)
   Subjective:  Patient reports pain as mild to moderate.  Denies N/V/CP/SOB. No c/o.   Objective:   VITALS:   Filed Vitals:   07/31/15 1404 07/31/15 2012 07/31/15 2347 08/01/15 0417  BP:  92/48 91/57 112/57  Pulse:  58 61 61  Temp:  97.7 F (36.5 C) 97.8 F (36.6 C) 98.4 F (36.9 C)  TempSrc:  Oral Axillary Oral  Resp:  22 18 16   Weight:      SpO2: 98% 94% 93% 97%    ABD soft Incision: dressing C/D/I (+) AIN, PIN, U SILT Ax, MC, R, U, M 2+ radial  Lab Results  Component Value Date   WBC 6.3 07/23/2015   HGB 10.6* 08/01/2015   HCT 32.5* 08/01/2015   MCV 99.7 07/23/2015   PLT 193 07/23/2015   BMET    Component Value Date/Time   NA 139 08/01/2015 0606   K 4.1 08/01/2015 0606   CL 102 08/01/2015 0606   CO2 30 08/01/2015 0606   GLUCOSE 101* 08/01/2015 0606   BUN 8 08/01/2015 0606   CREATININE 0.76 08/01/2015 0606   CALCIUM 8.7* 08/01/2015 0606   GFRNONAA >60 08/01/2015 0606   GFRAA >60 08/01/2015 0606     Assessment/Plan: 1 Day Post-Op   Active Problems:   S/P shoulder replacement   Advance diet Up with therapy PO pain control OT D/C home today vs tomorrow depending on pain control with therapy   Lyric Rossano, Horald Pollen 08/01/2015, 8:15 AM   Rod Can, MD Cell [phone]

## 2015-08-01 NOTE — Discharge Summary (Signed)
Physician Discharge Summary  Patient ID: [name] [name] MRN: [id number] DOB/AGE: Jul 01, 1930 82 y.o.  Admit date: 07/31/2015 Discharge date: 08/02/2015  Admission Diagnoses:  S/P shoulder replacement  Discharge Diagnoses:  Principal Problem:   S/P shoulder replacement   Past Medical History  Diagnosis Date  . DEGENERATIVE JOINT DISEASE, KNEE   . GENU VALGUM   . 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 ROTATOR CUFF SYNDROME, LEFT     CANNOT LIFT LEFT ARM ALL THE WAY UP  . UNEQUAL LEG LENGTH   . Bradycardia     asymptomatic  . PVC's (premature ventricular contractions)   . Hypertension   . Hyperlipidemia   . COPD (chronic obstructive pulmonary disease) (HCC)     MILD, NO INHALERS USED  . Pneumonia 2013    X 2  . Anxiety   . UTI (urinary tract infection)     STARTED AUGMENTIN  ON 09-10-13  . Breast CA ([place]) 1993 OR 1994    LEFT, SURGERY AND RADIATION DONE  . Family history of anesthesia complication     NEPHEW NAUSEA/VOMITING  . Numbness of leg 2011    just left shin    Surgeries: Procedure(s): LEFT REVERSE SHOULDER ARTHROPLASTY on 07/31/2015   Consultants (if any):    Discharged Condition: Improved  Hospital Course: [name] [name] is an 82 y.o. female who was admitted 07/31/2015 with a diagnosis of S/P shoulder replacement and went to the operating room on 07/31/2015 and underwent the above named procedures.    She was given perioperative antibiotics:      Anti-infectives    Start     Dose/Rate Route Frequency Ordered Stop   07/31/15 1430  ceFAZolin (ANCEF) IVPB 2g/100 mL premix     2 g 200 mL/hr over 30 Minutes Intravenous Every 6 hours 07/31/15 1404 08/01/15 0324   07/31/15 0900  ceFAZolin (ANCEF) IVPB 2g/100 mL premix     2 g 200 mL/hr over 30 Minutes Intravenous To [facility] 07/31/15 0708 07/31/15 0851    .  She was given sequential compression devices and early ambulation for DVT prophylaxis.  She benefited maximally from the  hospital stay and there were no complications.    Recent vital signs:  Filed Vitals:   07/31/15 2347 08/01/15 0417  BP: 91/57 112/57  Pulse: 61 61  Temp: 97.8 F (36.6 C) 98.4 F (36.9 C)  Resp: 18 16    Recent laboratory studies:  Lab Results  Component Value Date   HGB 10.6* 08/01/2015   HGB 12.6 07/23/2015   HGB 10.8* 09/19/2013   Lab Results  Component Value Date   WBC 6.3 07/23/2015   PLT 193 07/23/2015   Lab Results  Component Value Date   INR 1.03 09/11/2013   Lab Results  Component Value Date   NA 139 08/01/2015   K 4.1 08/01/2015   CL 102 08/01/2015   CO2 30 08/01/2015   BUN 8 08/01/2015   CREATININE 0.76 08/01/2015   GLUCOSE 101* 08/01/2015    Discharge Medications:     Medication List    TAKE these medications        ALIGN PO  Take 1 tablet by mouth daily.     amoxicillin 500 MG capsule  Commonly known as:  AMOXIL  Take 2,000 mg by mouth See admin instructions. Pt takes prior to dental appointments     atorvastatin 20 MG tablet  Commonly known as:  LIPITOR  Take 20 mg by mouth  every evening.     BIOTIN PO  Take 1 tablet by mouth daily as needed (supplement).     diphenoxylate-atropine 2.5-0.025 MG tablet  Commonly known as:  LOMOTIL  Take 1 tablet by mouth 4 (four) times daily as needed for diarrhea or loose stools.     escitalopram 10 MG tablet  Commonly known as:  LEXAPRO  Take 10 mg by mouth every morning.     eszopiclone 2 MG Tabs tablet  Commonly known as:  LUNESTA  Take 1 mg by mouth at bedtime as needed for sleep.     HYDROcodone-acetaminophen 5-325 MG tablet  Commonly known as:  NORCO  Take 1 tablet by mouth every 6 (six) hours as needed for moderate pain.     lisinopril 10 MG tablet  Commonly known as:  PRINIVIL,ZESTRIL  take 1 tablet by mouth daily     LORazepam 0.5 MG tablet  Commonly known as:  ATIVAN  Take 0.25 mg by mouth every 8 (eight) hours as needed for anxiety.     metoprolol succinate 50 MG 24 hr tablet   Commonly known as:  TOPROL-XL  TAKE 1 TABLET DAILY     montelukast 10 MG tablet  Commonly known as:  SINGULAIR  Take 10 mg by mouth daily.     multivitamin with minerals Tabs tablet  Take 1 tablet by mouth daily.     NEXIUM 40 MG capsule  Generic drug:  esomeprazole  Take 40 mg by mouth daily as needed (indigestion). For indigestion     PROAIR RESPICLICK 123XX123 (90 Base) MCG/ACT Aepb  Generic drug:  Albuterol Sulfate  Inhale 1 puff into the lungs every 6 (six) hours as needed (wheezing).     THERATEARS ALLERGY 0.025 % ophthalmic solution  Generic drug:  ketotifen  Place 1 drop into both eyes 2 (two) times daily as needed (Dry eyes).     Vitamin D (Ergocalciferol) 50000 units Caps capsule  Commonly known as:  DRISDOL  Take 50,000 Units by mouth every 7 (seven) days. Mondays     ZYLET OP  Place 1 drop into both eyes daily as needed (dry eyes).        Diagnostic Studies: Dg Shoulder Left Port  07/31/2015  CLINICAL DATA:  Status post left total hip arthroplasty. EXAM: LEFT SHOULDER - 1 VIEW COMPARISON:  A a FINDINGS: The surgical changes from a reversed shoulder arthroplasty. The glenoid and humeral components appear well seated. No complicating features are demonstrated. The [facility] joint is intact. The visualized left lung is grossly clear. IMPRESSION: Well seated components of a reverse left total shoulder arthroplasty. Electronically Signed   By: Marijo Sanes M.D.   On: 07/31/2015 11:35    Disposition: 01-Home or Self Care  Discharge Instructions    Call MD / Call 911    Complete by:  As directed   If you experience chest pain or shortness of breath, CALL 911 and be transported to the hospital emergency room.  If you develope a fever above 101 F, pus (white drainage) or increased drainage or redness at the wound, or calf pain, call your surgeon's office.     Constipation Prevention    Complete by:  As directed   Drink plenty of fluids.  Prune juice may be helpful.  You may use a  stool softener, such as Colace (over the counter) 100 mg twice a day.  Use MiraLax (over the counter) for constipation as needed.     Diet - low sodium  heart healthy    Complete by:  As directed      Driving restrictions    Complete by:  As directed   No driving for 6 weeks     Increase activity slowly as tolerated    Complete by:  As directed      Lifting restrictions    Complete by:  As directed   No lifting for 6 weeks           Follow-up Information    Follow up with NORRIS,STEVEN R, MD. Call in 2 weeks.   Specialty:  Orthopedic Surgery   Why:  [id number]   Contact information:   [address] [place] [address] [id number]        Signed: Elie Goody 08/02/2015, 8:50 AM

## 2015-08-01 NOTE — Progress Notes (Signed)
Occupational Therapy Evaluation Patient Details Name: [name] [name] MRN: [id number] DOB: [birth year] Today's Date: 08/01/2015    History of Present Illness s/p L reverse TSA   Clinical Impression   PTA, pt independent with ADL and mobility and very active. Pt making excellent progress with ADL and ROM of LUE. Will return @ lunchtime for second session and complete education. Pt will be appropriate for D/C when medically stable. Recommend HHOT/PT due to pt living alone and having intermittent assistance. Pt motivated to return to PLOF.    Follow Up Recommendations  Home health OT;Supervision - Intermittent    Equipment Recommendations  None recommended by OT    Recommendations for Other Services       Precautions / Restrictions Precautions Precautions: Shoulder Type of Shoulder Precautions: AROM/PROM shoulder FF 0-90; ER 0-30; Abd 0-60 Shoulder Interventions: Shoulder sling/immobilizer;For comfort (needs to sleep in sling) Precaution Booklet Issued: Yes (comment) Required Braces or Orthoses: Sling Restrictions Weight Bearing Restrictions: Yes LUE Weight Bearing: Non weight bearing      Mobility Bed Mobility Overal bed mobility: Modified Independent                Transfers Overall transfer level: Needs assistance Equipment used: 1 person hand held assist Transfers: Sit to/from [employer] Transfers Sit to Stand: Supervision Stand pivot transfers: Min guard       General transfer comment: minimal unsteadiness    Balance Overall balance assessment: Needs assistance   Sitting balance-Leahy Scale: Normal       Standing balance-Leahy Scale: Fair                              ADL Overall ADL's : Needs assistance/impaired Eating/Feeding: Set up;Sitting   Grooming: Set up;Standing   Upper Body Bathing: Minimal assitance;Sitting   Lower Body Bathing: Set up;Sit to/from stand   Upper Body Dressing : Moderate assistance   Lower Body  Dressing: Set up;Sit to/from stand   Toilet Transfer: Ambulation;Min Psychiatric nurse Details (indicate cue type and reason): minimal unsteadiness Toileting- Clothing Manipulation and Hygiene: Minimal assistance       Functional mobility during ADLs: Min guard General ADL Comments: Began education on compensatory techniques for ADL and shoulder precautions     Vision     Perception     Praxis      Pertinent Vitals/Pain Pain Assessment: 0-10 Pain Score: 5  Pain Location: L shoulder Pain Descriptors / Indicators: Aching;Sore;Discomfort Pain Intervention(s): Limited activity within patient's tolerance;Repositioned;Ice applied     Hand Dominance Right   Extremity/Trunk Assessment Upper Extremity Assessment Upper Extremity Assessment: LUE deficits/detail LUE Deficits / Details: minimal finger numbness. Able to achieve full elbow flex/ext. able to touch L hand to ear and complete lap slides. completed FF 0-30; Abd 0-30; ER o-30 LUE Coordination: decreased gross motor   Lower Extremity Assessment Lower Extremity Assessment: Overall WFL for tasks assessed   Cervical / Trunk Assessment Cervical / Trunk Assessment: Normal   Communication Communication Communication: No difficulties   Cognition Arousal/Alertness: Awake/alert Behavior During Therapy: WFL for tasks assessed/performed Overall Cognitive Status: Within Functional Limits for tasks assessed                     General Comments       Exercises Exercises: Shoulder     Shoulder Instructions Shoulder Instructions Donning/doffing shirt without moving shoulder: Moderate assistance Method for sponge bathing under operated UE: Minimal assistance Donning/doffing sling/immobilizer:  Moderate assistance Correct positioning of sling/immobilizer: Minimal assistance ROM for elbow, wrist and digits of operated UE: Supervision/safety Sling wearing schedule (on at all times/off for ADL's): Minimal  assistance Proper positioning of operated UE when showering: Minimal assistance Positioning of UE while sleeping: Minimal assistance    Home Living Family/patient expects to be discharged to:: Private residence Living Arrangements: Alone;Non-relatives/Friends Available Help at Discharge: Friend(s);Available PRN/intermittently Type of Home: House Home Access: Stairs to enter [employer] of Steps: 2 Entrance Stairs-Rails: None Home Layout: One level     Bathroom Shower/Tub: Occupational psychologist: Standard Bathroom Accessibility: Yes How Accessible: Accessible via walker Home Equipment: Grab bars - tub/shower;Shower seat - built in          Prior Functioning/Environment Level of Independence: Independent        Comments: very active. exercises 3x/wk; has trainer    OT Diagnosis: Generalized weakness;Acute pain   OT Problem List: Decreased strength;Decreased range of motion;Impaired balance (sitting and/or standing);Decreased safety awareness;Decreased knowledge of use of DME or AE;Decreased knowledge of precautions;Impaired UE functional use;Pain   OT Treatment/Interventions: Self-care/ADL training;Therapeutic exercise;DME and/or AE instruction;Therapeutic activities;Patient/family education    OT Goals(Current goals can be found in the care plan section) Acute Rehab OT Goals Patient Stated Goal: to get back to being active OT Goal Formulation: With patient Time For Goal Achievement: 08/08/15 Potential to Achieve Goals: Good ADL Goals Pt Will Perform Upper Body Bathing: with supervision;sitting Pt Will Perform Upper Body Dressing: with supervision;sitting Pt/caregiver will Perform Home Exercise Program: Increased ROM;Left upper extremity;With Supervision;With written HEP provided (active protocol per orders)  OT Frequency: Min 2X/week   Barriers to D/C:            Co-evaluation              End of Session Equipment Utilized During  Treatment: Gait belt Nurse Communication: Mobility status  Activity Tolerance: Patient tolerated treatment well Patient left: in chair;with call bell/phone within reach   Time: 0830-0920 OT Time Calculation (min): 50 min Charges:  OT General Charges $OT Visit: 1 Procedure OT Evaluation $OT Eval Moderate Complexity: 1 Procedure OT Treatments $Self Care/Home Management : 23-37 mins G-Codes:    Meleah Demeyer,HILLARY 08-19-2015, 10:54 AM   Maurie Boettcher, OTR/L  [phone] August 19, 2015

## 2015-08-01 NOTE — Progress Notes (Signed)
Occupational Therapy Treatment Patient Details Name: [name] [name] MRN: [id number] DOB: 1930/08/30 Today's Date: 08/01/2015    History of present illness s/p L reverse TSA   OT comments  Making excellent progress but requires cues for HEP and to not "overuse" LUE for functional tasks. Recommend pt have HHPT/OT for safe D/C home and maximize functional use LUE as pt lives alone. Pt safe to D/C home when medically stable.   Follow Up Recommendations  Home health OT;Supervision - Intermittent    Equipment Recommendations  None recommended by OT    Recommendations for Other Services      Precautions / Restrictions Precautions Precautions: Shoulder Type of Shoulder Precautions: AROM/PROM shoulder FF 0-90; ER 0-30; Abd 0-60 Shoulder Interventions: Shoulder sling/immobilizer;For comfort Precaution Booklet Issued: Yes (comment) Required Braces or Orthoses: Sling Restrictions Weight Bearing Restrictions: Yes LUE Weight Bearing: Non weight bearing       Mobility Bed Mobility                  Transfers Overall transfer level: Modified independent                    Balance Overall balance assessment: Modified Independent                                 ADL                                       Functional mobility during ADLs: Supervision/safety General ADL Comments: Reveiwed compensatory techniques for ADL. Pt verbalized understanding. Pt able to demonstrte donning/doffing sling independently and able to state how to correctly position LUE when sitting and when in supine. Requiires cues to not "overuse" LUE.       Vision                     Perception     Praxis      Cognition   Behavior During Therapy: [name] for tasks assessed/performed Overall Cognitive Status: Within Functional Limits for tasks assessed                       Extremity/Trunk Assessment               Exercises Shoulder  Exercises Shoulder Flexion: AAROM;Left;10 reps Shoulder ABduction: AAROM;Left;10 reps Shoulder External Rotation: AROM;Left;10 reps Elbow Flexion: AROM;Left;10 reps;Seated Elbow Extension: AROM;Left;10 reps;Seated Digit Composite Flexion: AROM;Left;10 reps Composite Extension: AROM;Left;10 reps Donning/doffing shirt without moving shoulder: Supervision/safety;Patient able to independently direct caregiver Method for sponge bathing under operated UE: Supervision/safety Donning/doffing sling/immobilizer: Supervision/safety Correct positioning of sling/immobilizer: Modified independent ROM for elbow, wrist and digits of operated UE: Supervision/safety Sling wearing schedule (on at all times/off for ADL's): Supervision/safety Proper positioning of operated UE when showering: Supervision/safety Positioning of UE while sleeping: Supervision/safety   Shoulder Instructions Shoulder Instructions Donning/doffing shirt without moving shoulder: Supervision/safety;Patient able to independently direct caregiver Method for sponge bathing under operated UE: Supervision/safety Donning/doffing sling/immobilizer: Supervision/safety Correct positioning of sling/immobilizer: Modified independent ROM for elbow, wrist and digits of operated UE: Supervision/safety Sling wearing schedule (on at all times/off for ADL's): Supervision/safety Proper positioning of operated UE when showering: Supervision/safety Positioning of UE while sleeping: Supervision/safety     General Comments      Pertinent Vitals/ Pain  Pain Assessment: 0-10 Pain Score: 4  Pain Location: L shoulder Pain Descriptors / Indicators: Aching;Sore Pain Intervention(s): Limited activity within patient's tolerance;Repositioned;Ice applied  Home Living                                          Prior Functioning/Environment              Frequency Min 2X/week     Progress Toward Goals  OT Goals(current goals  can now be found in the care plan section)  Progress towards OT goals: Progressing toward goals  Acute Rehab OT Goals Patient Stated Goal: to get back to being active OT Goal Formulation: With patient Time For Goal Achievement: 08/08/15 Potential to Achieve Goals: Good ADL Goals Pt Will Perform Upper Body Bathing: with supervision;sitting Pt Will Perform Upper Body Dressing: with supervision;sitting Pt/caregiver will Perform Home Exercise Program: Increased ROM;Left upper extremity;With Supervision;With written HEP provided  Plan Discharge plan remains appropriate    Co-evaluation                 End of Session Equipment Utilized During Treatment: Gait belt   Activity Tolerance Patient tolerated treatment well   Patient Left in chair;with call bell/phone within reach   Nurse Communication Mobility status        Time: 1402-1430 OT Time Calculation (min): 28 min  Charges: OT General Charges $OT Visit: 1 Procedure OT Treatments $Self Care/Home Management : 8-22 mins $Therapeutic Exercise: 8-22 mins  Areona Homer,HILLARY 08/01/2015, 2:41 PM   [facility], OTR/L  [phone] 08/01/2015

## 2015-08-02 NOTE — Care Management Note (Signed)
Case Management Note  Patient Details  Name: HILDRED RYKS MRN: [id number] Date of Birth: 1930-02-04  Subjective/Objective:                  S/P shoulder replacement Action/Plan: Discharge planning Expected Discharge Date:  08/01/15               Expected Discharge Plan:  [place]  In-House Referral:     Discharge planning Services  CM Consult  Post Acute Care Choice:  Home Health Choice offered to:  Patient  DME Arranged:    DME Agency:     HH Arranged:  PT, OT [place] Agency:  [place] (now [facility])  Status of Service:  Completed, signed off  If discussed at [employer] of Stay Meetings, dates discussed:    Additional Comments: CM spoke with pt to offer choice of home health agency.  Pt chooses [facility] services.  Referral called to [employer], [place]. No other CM needs were communicated. Dellie Catholic, RN 08/02/2015, 10:46 AM

## 2015-08-03 ENCOUNTER — Encounter (HOSPITAL_COMMUNITY): Payer: Self-pay | Admitting: Orthopedic Surgery

## 2015-08-03 DIAGNOSIS — I10 Essential (primary) hypertension: Secondary | ICD-10-CM | POA: Diagnosis not present

## 2015-08-03 DIAGNOSIS — J449 Chronic obstructive pulmonary disease, unspecified: Secondary | ICD-10-CM | POA: Diagnosis not present

## 2015-08-03 DIAGNOSIS — Z471 Aftercare following joint replacement surgery: Secondary | ICD-10-CM | POA: Diagnosis not present

## 2015-08-03 DIAGNOSIS — F419 Anxiety disorder, unspecified: Secondary | ICD-10-CM | POA: Diagnosis not present

## 2015-08-03 DIAGNOSIS — Z96612 Presence of left artificial shoulder joint: Secondary | ICD-10-CM | POA: Diagnosis not present

## 2015-08-03 DIAGNOSIS — M179 Osteoarthritis of knee, unspecified: Secondary | ICD-10-CM | POA: Diagnosis not present

## 2015-08-04 DIAGNOSIS — M179 Osteoarthritis of knee, unspecified: Secondary | ICD-10-CM | POA: Diagnosis not present

## 2015-08-04 DIAGNOSIS — I10 Essential (primary) hypertension: Secondary | ICD-10-CM | POA: Diagnosis not present

## 2015-08-04 DIAGNOSIS — Z96612 Presence of left artificial shoulder joint: Secondary | ICD-10-CM | POA: Diagnosis not present

## 2015-08-04 DIAGNOSIS — F419 Anxiety disorder, unspecified: Secondary | ICD-10-CM | POA: Diagnosis not present

## 2015-08-04 DIAGNOSIS — Z471 Aftercare following joint replacement surgery: Secondary | ICD-10-CM | POA: Diagnosis not present

## 2015-08-04 DIAGNOSIS — J449 Chronic obstructive pulmonary disease, unspecified: Secondary | ICD-10-CM | POA: Diagnosis not present

## 2015-08-06 DIAGNOSIS — Z96612 Presence of left artificial shoulder joint: Secondary | ICD-10-CM | POA: Diagnosis not present

## 2015-08-06 DIAGNOSIS — Z471 Aftercare following joint replacement surgery: Secondary | ICD-10-CM | POA: Diagnosis not present

## 2015-08-06 DIAGNOSIS — I10 Essential (primary) hypertension: Secondary | ICD-10-CM | POA: Diagnosis not present

## 2015-08-06 DIAGNOSIS — J449 Chronic obstructive pulmonary disease, unspecified: Secondary | ICD-10-CM | POA: Diagnosis not present

## 2015-08-06 DIAGNOSIS — F419 Anxiety disorder, unspecified: Secondary | ICD-10-CM | POA: Diagnosis not present

## 2015-08-06 DIAGNOSIS — M179 Osteoarthritis of knee, unspecified: Secondary | ICD-10-CM | POA: Diagnosis not present

## 2015-08-07 DIAGNOSIS — M179 Osteoarthritis of knee, unspecified: Secondary | ICD-10-CM | POA: Diagnosis not present

## 2015-08-07 DIAGNOSIS — F419 Anxiety disorder, unspecified: Secondary | ICD-10-CM | POA: Diagnosis not present

## 2015-08-07 DIAGNOSIS — Z471 Aftercare following joint replacement surgery: Secondary | ICD-10-CM | POA: Diagnosis not present

## 2015-08-07 DIAGNOSIS — J449 Chronic obstructive pulmonary disease, unspecified: Secondary | ICD-10-CM | POA: Diagnosis not present

## 2015-08-07 DIAGNOSIS — Z96612 Presence of left artificial shoulder joint: Secondary | ICD-10-CM | POA: Diagnosis not present

## 2015-08-07 DIAGNOSIS — I10 Essential (primary) hypertension: Secondary | ICD-10-CM | POA: Diagnosis not present

## 2015-08-10 DIAGNOSIS — J449 Chronic obstructive pulmonary disease, unspecified: Secondary | ICD-10-CM | POA: Diagnosis not present

## 2015-08-10 DIAGNOSIS — Z96612 Presence of left artificial shoulder joint: Secondary | ICD-10-CM | POA: Diagnosis not present

## 2015-08-10 DIAGNOSIS — M179 Osteoarthritis of knee, unspecified: Secondary | ICD-10-CM | POA: Diagnosis not present

## 2015-08-10 DIAGNOSIS — Z471 Aftercare following joint replacement surgery: Secondary | ICD-10-CM | POA: Diagnosis not present

## 2015-08-10 DIAGNOSIS — I10 Essential (primary) hypertension: Secondary | ICD-10-CM | POA: Diagnosis not present

## 2015-08-10 DIAGNOSIS — F419 Anxiety disorder, unspecified: Secondary | ICD-10-CM | POA: Diagnosis not present

## 2015-08-12 DIAGNOSIS — Z96612 Presence of left artificial shoulder joint: Secondary | ICD-10-CM | POA: Diagnosis not present

## 2015-08-12 DIAGNOSIS — F419 Anxiety disorder, unspecified: Secondary | ICD-10-CM | POA: Diagnosis not present

## 2015-08-12 DIAGNOSIS — I10 Essential (primary) hypertension: Secondary | ICD-10-CM | POA: Diagnosis not present

## 2015-08-12 DIAGNOSIS — Z471 Aftercare following joint replacement surgery: Secondary | ICD-10-CM | POA: Diagnosis not present

## 2015-08-12 DIAGNOSIS — M179 Osteoarthritis of knee, unspecified: Secondary | ICD-10-CM | POA: Diagnosis not present

## 2015-08-12 DIAGNOSIS — J449 Chronic obstructive pulmonary disease, unspecified: Secondary | ICD-10-CM | POA: Diagnosis not present

## 2015-08-13 DIAGNOSIS — Z471 Aftercare following joint replacement surgery: Secondary | ICD-10-CM | POA: Diagnosis not present

## 2015-08-13 DIAGNOSIS — Z96612 Presence of left artificial shoulder joint: Secondary | ICD-10-CM | POA: Diagnosis not present

## 2015-08-17 DIAGNOSIS — Z96612 Presence of left artificial shoulder joint: Secondary | ICD-10-CM | POA: Diagnosis not present

## 2015-08-17 DIAGNOSIS — F419 Anxiety disorder, unspecified: Secondary | ICD-10-CM | POA: Diagnosis not present

## 2015-08-17 DIAGNOSIS — Z471 Aftercare following joint replacement surgery: Secondary | ICD-10-CM | POA: Diagnosis not present

## 2015-08-17 DIAGNOSIS — J449 Chronic obstructive pulmonary disease, unspecified: Secondary | ICD-10-CM | POA: Diagnosis not present

## 2015-08-17 DIAGNOSIS — M179 Osteoarthritis of knee, unspecified: Secondary | ICD-10-CM | POA: Diagnosis not present

## 2015-08-17 DIAGNOSIS — I10 Essential (primary) hypertension: Secondary | ICD-10-CM | POA: Diagnosis not present

## 2015-09-06 ENCOUNTER — Other Ambulatory Visit: Payer: Self-pay | Admitting: Cardiology

## 2015-09-10 DIAGNOSIS — Z96612 Presence of left artificial shoulder joint: Secondary | ICD-10-CM | POA: Diagnosis not present

## 2015-09-10 DIAGNOSIS — Z471 Aftercare following joint replacement surgery: Secondary | ICD-10-CM | POA: Diagnosis not present

## 2015-10-03 DIAGNOSIS — Z23 Encounter for immunization: Secondary | ICD-10-CM | POA: Diagnosis not present

## 2015-10-15 ENCOUNTER — Ambulatory Visit (INDEPENDENT_AMBULATORY_CARE_PROVIDER_SITE_OTHER): Payer: Medicare Other | Admitting: Ophthalmology

## 2015-10-15 DIAGNOSIS — H35033 Hypertensive retinopathy, bilateral: Secondary | ICD-10-CM

## 2015-10-15 DIAGNOSIS — H353131 Nonexudative age-related macular degeneration, bilateral, early dry stage: Secondary | ICD-10-CM | POA: Diagnosis not present

## 2015-10-15 DIAGNOSIS — H35372 Puckering of macula, left eye: Secondary | ICD-10-CM | POA: Diagnosis not present

## 2015-10-15 DIAGNOSIS — I10 Essential (primary) hypertension: Secondary | ICD-10-CM

## 2015-10-15 DIAGNOSIS — H43813 Vitreous degeneration, bilateral: Secondary | ICD-10-CM

## 2015-10-19 DIAGNOSIS — N390 Urinary tract infection, site not specified: Secondary | ICD-10-CM | POA: Diagnosis not present

## 2015-10-19 DIAGNOSIS — R8299 Other abnormal findings in urine: Secondary | ICD-10-CM | POA: Diagnosis not present

## 2015-10-20 DIAGNOSIS — Z96612 Presence of left artificial shoulder joint: Secondary | ICD-10-CM | POA: Diagnosis not present

## 2015-10-20 DIAGNOSIS — Z471 Aftercare following joint replacement surgery: Secondary | ICD-10-CM | POA: Diagnosis not present

## 2015-11-18 ENCOUNTER — Telehealth: Payer: Self-pay | Admitting: Cardiology

## 2015-11-18 NOTE — Telephone Encounter (Signed)
Pt c/o BP issue: STAT if pt c/o blurred vision, one-sided weakness or slurred speech  1. What are your last 5 BP readings? Yesterday 143/84, Last night 129/74 and this morning before medication 161/90  2. Are you having any other symptoms (ex. Dizziness, headache, blurred vision, passed out)? No  3. What is your BP issue?Bp is Elevated

## 2015-11-18 NOTE — Telephone Encounter (Signed)
Confirmed with patient she does not have any symptoms.  Instructed her to only check her BP 2 hours after taking medications and to call Friday afternoon with readings. She was grateful for call.

## 2015-11-20 ENCOUNTER — Telehealth: Payer: Self-pay | Admitting: Cardiology

## 2015-11-20 NOTE — Telephone Encounter (Signed)
Informed patient that her BP is controlled.  She understands to call back if she has any symptoms or if her systolic BP is consistently in the 150s. She was grateful for call.

## 2015-11-20 NOTE — Telephone Encounter (Signed)
[name] [name] is calling to give her blood pressure readings : Wednesday 147/74 pulse 72 , Thursday :114/74 pulse 52 and today 105/66 pulse 62.  She has took the last two times Lexapro for nerves . Please call   Thanks Wendelyn Breslow

## 2015-12-17 ENCOUNTER — Ambulatory Visit: Payer: Medicare Other | Admitting: Cardiology

## 2015-12-25 ENCOUNTER — Encounter: Payer: Self-pay | Admitting: Cardiology

## 2015-12-25 ENCOUNTER — Encounter (INDEPENDENT_AMBULATORY_CARE_PROVIDER_SITE_OTHER): Payer: Self-pay

## 2015-12-25 ENCOUNTER — Ambulatory Visit (INDEPENDENT_AMBULATORY_CARE_PROVIDER_SITE_OTHER): Payer: Medicare Other | Admitting: Cardiology

## 2015-12-25 VITALS — BP 124/68 | HR 58 | Ht 65.0 in | Wt 138.0 lb

## 2015-12-25 DIAGNOSIS — R001 Bradycardia, unspecified: Secondary | ICD-10-CM | POA: Diagnosis not present

## 2015-12-25 DIAGNOSIS — I493 Ventricular premature depolarization: Secondary | ICD-10-CM

## 2015-12-25 DIAGNOSIS — I10 Essential (primary) hypertension: Secondary | ICD-10-CM

## 2015-12-25 LAB — BASIC METABOLIC PANEL
BUN: 18 mg/dL (ref 7–25)
CALCIUM: 9.2 mg/dL (ref 8.6–10.4)
CO2: 29 mmol/L (ref 20–31)
CREATININE: 0.87 mg/dL (ref 0.60–0.88)
Chloride: 102 mmol/L (ref 98–110)
Glucose, Bld: 73 mg/dL (ref 65–99)
Potassium: 4.6 mmol/L (ref 3.5–5.3)
SODIUM: 137 mmol/L (ref 135–146)

## 2015-12-25 NOTE — Patient Instructions (Signed)
Medication Instructions:  Your physician recommends that you continue on your current medications as directed. Please refer to the Current Medication list given to you today.   Labwork: TODAY: BMET  Testing/Procedures: None  Follow-Up: Your physician wants you to follow-up in: 1 year with Dr. Turner. You will receive a reminder letter in the mail two months in advance. If you don't receive a letter, please call our office to schedule the follow-up appointment.   Any Other Special Instructions Will Be Listed Below (If Applicable).     If you need a refill on your cardiac medications before your next appointment, please call your pharmacy.   

## 2015-12-25 NOTE — Progress Notes (Signed)
Cardiology Office Note    Date:  12/25/2015   ID:  MIRL FYE, DOB 1930-03-18, MRN [id number]  PCP:  Jerlyn Ly, MD  Cardiologist:  Fransico Him, MD   Chief Complaint  Patient presents with  . Follow-up    PVCs and bradycardia    History of Present Illness:  [name] [name] is a 82 y.o. female with a history of PVC's and HTN.She has a lot of anxiety.  She says that she had her shoulder surgery and her BP was high at 160/47mmHg but after that her BP improved.  She is concerned that over the past few months her BP has ben on the high side.  She brought in her BP readings and most of them are below 140/76mmHg but there are a few outliers in the 150's.  She denies any chest pain, SOB, DOE, LE edema, dizziness or syncope. She says that the night after her surgery she noticed that her heart rate was faster but then it resolved.  She has not had many problems with her PVC's.    Past Medical History:  Diagnosis Date  . Anxiety   . Bradycardia    asymptomatic  . Breast CA ([place]) 1993 OR 1994   LEFT, SURGERY AND RADIATION DONE  . COPD (chronic obstructive pulmonary disease) (HCC)    MILD, NO INHALERS USED  . DEGENERATIVE JOINT DISEASE, KNEE   . Family history of anesthesia complication    NEPHEW NAUSEA/VOMITING  . GENU VALGUM   . Hyperlipidemia   . Hypertension   . Numbness of leg 2011   just left shin  . 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 Pneumonia 2013   X 2  . PVC's (premature ventricular contractions)   . ROTATOR CUFF SYNDROME, LEFT    CANNOT LIFT LEFT ARM ALL THE WAY UP  . UNEQUAL LEG LENGTH   . UTI (urinary tract infection)    STARTED AUGMENTIN  ON 09-10-13    Past Surgical History:  Procedure Laterality Date  . APPENDECTOMY  AGE 8  . BREAST SURGERY Left 1993 OR 1994   LUMPECTOMY AND RADIATION DONE  . JOINT REPLACEMENT    . NASAL SINUS SURGERY  2006  . REVERSE SHOULDER ARTHROPLASTY Left 07/31/2015   Procedure: LEFT REVERSE SHOULDER  ARTHROPLASTY;  Surgeon: Netta Cedars, MD;  Location: [facility];  Service: Orthopedics;  Laterality: Left;  . TOTAL HIP ARTHROPLASTY Bilateral LEFT 2008 AND RIGHT 2010  . TOTAL KNEE ARTHROPLASTY Left 09/17/2013   Procedure: LEFT TOTAL KNEE ARTHROPLASTY;  Surgeon: Mauri Pole, MD;  Location: [facility];  Service: Orthopedics;  Laterality: Left;    Current Medications: Outpatient Medications Prior to Visit  Medication Sig Dispense Refill  . Albuterol Sulfate (PROAIR RESPICLICK) 123XX123 (90 Base) MCG/ACT AEPB Inhale 1 puff into the lungs every 6 (six) hours as needed (wheezing).    [name] amoxicillin (AMOXIL) 500 MG capsule Take 2,000 mg by mouth See admin instructions. Pt takes prior to dental appointments    . atorvastatin (LIPITOR) 20 MG tablet Take 20 mg by mouth every evening.     [name] BIOTIN PO Take 1 tablet by mouth daily as needed (supplement).    . diphenoxylate-atropine (LOMOTIL) 2.5-0.025 MG per tablet Take 1 tablet by mouth 4 (four) times daily as needed for diarrhea or loose stools. 120 tablet 0  . escitalopram (LEXAPRO) 10 MG tablet Take 10 mg by mouth every morning.    . eszopiclone (LUNESTA) 2 MG TABS  tablet Take 1 mg by mouth at bedtime as needed for sleep.   0  . HYDROcodone-acetaminophen (NORCO) 5-325 MG tablet Take 1 tablet by mouth every 6 (six) hours as needed for moderate pain. 30 tablet 0  . ketotifen (THERA TEARS ALLERGY) 0.025 % ophthalmic solution Place 1 drop into both eyes 2 (two) times daily as needed (Dry eyes).    [name] lisinopril (PRINIVIL,ZESTRIL) 10 MG tablet take 1 tablet by mouth daily 90 tablet 3  . LORazepam (ATIVAN) 0.5 MG tablet Take 0.25 mg by mouth every 8 (eight) hours as needed for anxiety.    Lita Mains (ZYLET OP) Place 1 drop into both eyes daily as needed (dry eyes).    . montelukast (SINGULAIR) 10 MG tablet Take 10 mg by mouth daily.  0  . Multiple Vitamin (MULTIVITAMIN WITH MINERALS) TABS tablet Take 1 tablet by mouth daily.    [name] NEXIUM 40 MG capsule Take  40 mg by mouth daily as needed (indigestion). For indigestion  0  . Probiotic Product (ALIGN PO) Take 1 tablet by mouth daily.    . TOPROL XL 50 MG 24 hr tablet TAKE 1 TABLET DAILY 90 tablet 2  . Vitamin D, Ergocalciferol, (DRISDOL) 50000 UNITS CAPS capsule Take 50,000 Units by mouth every 7 (seven) days. Mondays     No facility-administered medications prior to visit.      Allergies:   Other and Cardizem [diltiazem hcl]   Social History   Social History  . Marital status: Widowed    Spouse name: N/A  . Number of children: N/A  . Years of education: N/A   Social History Main Topics  . Smoking status: Former Smoker    Packs/day: 0.25    Years: 15.00    Types: Cigarettes    Quit date: 01/18/1992  . Smokeless tobacco: Never Used  . Alcohol use Yes     Comment: WINE 2 GLASSES PER DAY  . Drug use: No  . Sexual activity: Not Asked   Other Topics Concern  . None   Social History Narrative  . None     Family History:  The patient's family history includes Diabetes in her mother; Heart Problems in her father; Hypertension in her brother.   ROS:   Please see the history of present illness.    ROS All other systems reviewed and are negative.  No flowsheet data found.     PHYSICAL EXAM:   VS:  BP 124/68   Pulse (!) 58   Ht 5\' 5"  (1.651 m)   Wt 138 lb (62.6 kg)   BMI 22.96 kg/m    GEN: Well nourished, well developed, in no acute distress  HEENT: normal  Neck: no JVD, carotid bruits, or masses Cardiac: RRR; no murmurs, rubs, or gallops,no edema.  Intact distal pulses bilaterally.  Respiratory:  clear to auscultation bilaterally, normal work of breathing GI: soft, nontender, nondistended, + BS MS: no deformity or atrophy  Skin: warm and dry, no rash Neuro:  Alert and Oriented x 3, Strength and sensation are intact Psych: euthymic mood, full affect  Wt Readings from Last 3 Encounters:  12/25/15 138 lb (62.6 kg)  07/31/15 137 lb (62.1 kg)  07/23/15 137 lb 14.4 oz  (62.6 kg)      Studies/Labs Reviewed:   EKG:  EKG is ordered today.  The ekg ordered today demonstrates sinus bradycardia at 58bpm with no ST changes  Recent Labs: 07/23/2015: Platelets 193 08/01/2015: BUN 8; Creatinine, Ser 0.76; Hemoglobin 10.6;  Potassium 4.1; Sodium 139   Lipid Panel No results found for: CHOL, TRIG, HDL, CHOLHDL, VLDL, LDLCALC, LDLDIRECT  Additional studies/ records that were reviewed today include:  none    ASSESSMENT:    1. PVC's (premature ventricular contractions)   2. Essential hypertension   3. Bradycardia      PLAN:  In order of problems listed above:  1. PVC's fairly suppressed on BB therapy. 2. HTN - BP controlled on current meds.  Continue BB and ACE I.  Check BMET. 3. Drug induced bradycardia - asymptomatic    Medication Adjustments/Labs and Tests Ordered: Current medicines are reviewed at length with the patient today.  Concerns regarding medicines are outlined above.  Medication changes, Labs and Tests ordered today are listed in the Patient Instructions below.  There are no Patient Instructions on file for this visit.   Signed, Fransico Him, MD  12/25/2015 10:51 AM    [address], [place], [place]  [address] Phone: [phone]; Fax: [phone]

## 2016-02-12 ENCOUNTER — Telehealth: Payer: Self-pay | Admitting: Cardiology

## 2016-02-12 NOTE — Telephone Encounter (Signed)
New message       Pt c/o BP issue: STAT if pt c/o blurred vision, one-sided weakness or slurred speech  1. What are your last 5 BP readings? 161/100 on 1-16--pt ate onion soup; 127/76 HR 61 jan 17; 1-19 127/84 HR 67; 1-21 107/66 HR 60;  Today 126/78 HR 62  2. Are you having any other symptoms (ex. Dizziness, headache, blurred vision, passed out)?   none  3. What is your BP issue?  Pt is calling to report bp readings

## 2016-02-12 NOTE — Telephone Encounter (Signed)
BP controlled except for day she ate the soup.  Need to avoid salty foods

## 2016-02-15 NOTE — Telephone Encounter (Signed)
Informed patient that Per Dr. Radford Pax, her BP is well controlled when she does not eat salty foods. She understands to avoid salty foods from here out. She was grateful for call.

## 2016-02-29 ENCOUNTER — Telehealth: Payer: Self-pay | Admitting: Cardiology

## 2016-02-29 ENCOUNTER — Other Ambulatory Visit: Payer: Self-pay | Admitting: Cardiology

## 2016-02-29 MED ORDER — METOPROLOL SUCCINATE ER 50 MG PO TB24: 50.0000 mg | ORAL_TABLET | Freq: Every day | ORAL | 2 refills | Status: DC

## 2016-02-29 NOTE — Telephone Encounter (Signed)
Patient states she is not sure if she took her Toprol this AM, but she thinks she did. She took her BP a couple hours after taking her morning medications and her BP was 121/72.  Since the patient was unsure, her BP is normal and her HR was "fine" (she could not remember the rate), instructed her not to take any more today and to call back if her BP increases or if she gets more PVCs than usual. She was grateful for call.

## 2016-02-29 NOTE — Telephone Encounter (Signed)
Pt's Rx was sent to pt's pharmacy as requested. Confirmation received.  °

## 2016-02-29 NOTE — Telephone Encounter (Signed)
New message   Pt verbalized that she don't know if took a pill today or not   and if she should take a half of the metoprolol 50mg  or a full pill

## 2016-02-29 NOTE — Telephone Encounter (Signed)
°*  STAT* If patient is at the pharmacy, call can be transferred to refill team.   1. Which medications need to be refilled? (please list name of each medication and dose if known)  metropolol 50mg h  2. Which pharmacy/location (including street and city if local pharmacy) is medication to be sent to? [facility] order  3. Do they need a 30 day or 90 day supply? [name] [name]

## 2016-03-14 DIAGNOSIS — R05 Cough: Secondary | ICD-10-CM | POA: Diagnosis not present

## 2016-03-14 DIAGNOSIS — J209 Acute bronchitis, unspecified: Secondary | ICD-10-CM | POA: Diagnosis not present

## 2016-04-08 ENCOUNTER — Telehealth: Payer: Self-pay | Admitting: Cardiology

## 2016-04-08 NOTE — Telephone Encounter (Signed)
Left message to call back  

## 2016-04-08 NOTE — Telephone Encounter (Signed)
New message   Pt c/o medication issue:  1. Name of Medication: metoprolol succinate (TOPROL XL) 50 MG 24 hr tablet lisinopril (PRINIVIL,ZESTRIL) 10 MG tablet  2. How are you currently taking this medication (dosage and times per day)? 50mg  and 10 mg  3. Are you having a reaction (difficulty breathing--STAT)? NO  4. What is your medication issue? Does she need to take these if she feels like her BP is going up when she is out of town? She states she will not have a bp monitor with her and is concerned about when to take these.

## 2016-04-08 NOTE — Telephone Encounter (Signed)
New message   Pt c/o medication issue:  1. Name of Medication: lisinopril (PRINIVIL,ZESTRIL) 10 MG tabletmetoprolol succinate (TOPROL XL) 50 MG 24 hr tablet  2. How are you currently taking this medication (dosage and times per day)? 10mg  and 50mg   3. Are you having a reaction (difficulty breathing--STAT)?no  4. What is your medication issue? Pt wants to know if she feels her bp rising does she need to just take these meds? States she will not have a bp monitor with her to check and will be taking these based off of feelings. Wants to know if this is ok.

## 2016-04-08 NOTE — Telephone Encounter (Signed)
Patient states she is going on a trip next week and when she flies, she can never sleep and her heart always races when she gets off the plane. She is prescribed Lunesta. Instructed her to take one if her flight is overnight. If her heart is racing still when she gets off the plane (and hopefully it will not), she may take an extra half dose of Toprol. Instructed the patient NOT to take extra BP meds if she feels her BP is increasing. She understands not to take extra medications unless her actual VS show she needs something, and even then she should get recommendations from a doctor. Reiterated to patient that she has been checking BP and it has been fine, so they should be stable on the trip. Patient was grateful for encouragement and assistance and agrees with treatment plan.

## 2016-05-19 ENCOUNTER — Other Ambulatory Visit: Payer: Self-pay | Admitting: Internal Medicine

## 2016-05-19 DIAGNOSIS — M859 Disorder of bone density and structure, unspecified: Secondary | ICD-10-CM | POA: Diagnosis not present

## 2016-05-19 DIAGNOSIS — N390 Urinary tract infection, site not specified: Secondary | ICD-10-CM | POA: Diagnosis not present

## 2016-05-19 DIAGNOSIS — E784 Other hyperlipidemia: Secondary | ICD-10-CM | POA: Diagnosis not present

## 2016-05-19 DIAGNOSIS — R8299 Other abnormal findings in urine: Secondary | ICD-10-CM | POA: Diagnosis not present

## 2016-05-19 DIAGNOSIS — I10 Essential (primary) hypertension: Secondary | ICD-10-CM | POA: Diagnosis not present

## 2016-05-19 DIAGNOSIS — Z1231 Encounter for screening mammogram for malignant neoplasm of breast: Secondary | ICD-10-CM

## 2016-05-26 DIAGNOSIS — R300 Dysuria: Secondary | ICD-10-CM | POA: Diagnosis not present

## 2016-05-26 DIAGNOSIS — Z0000 Encounter for general adult medical examination without abnormal findings: Secondary | ICD-10-CM | POA: Diagnosis not present

## 2016-05-26 DIAGNOSIS — M8580 Other specified disorders of bone density and structure, unspecified site: Secondary | ICD-10-CM | POA: Diagnosis not present

## 2016-05-26 DIAGNOSIS — J449 Chronic obstructive pulmonary disease, unspecified: Secondary | ICD-10-CM | POA: Diagnosis not present

## 2016-05-26 DIAGNOSIS — E559 Vitamin D deficiency, unspecified: Secondary | ICD-10-CM | POA: Insufficient documentation

## 2016-05-26 DIAGNOSIS — I5189 Other ill-defined heart diseases: Secondary | ICD-10-CM | POA: Diagnosis not present

## 2016-05-26 DIAGNOSIS — J209 Acute bronchitis, unspecified: Secondary | ICD-10-CM | POA: Diagnosis not present

## 2016-05-26 DIAGNOSIS — Z1389 Encounter for screening for other disorder: Secondary | ICD-10-CM | POA: Diagnosis not present

## 2016-05-26 DIAGNOSIS — H9190 Unspecified hearing loss, unspecified ear: Secondary | ICD-10-CM | POA: Diagnosis not present

## 2016-05-26 DIAGNOSIS — Z6823 Body mass index (BMI) 23.0-23.9, adult: Secondary | ICD-10-CM | POA: Diagnosis not present

## 2016-05-26 DIAGNOSIS — D649 Anemia, unspecified: Secondary | ICD-10-CM | POA: Diagnosis not present

## 2016-05-26 DIAGNOSIS — J45909 Unspecified asthma, uncomplicated: Secondary | ICD-10-CM | POA: Diagnosis not present

## 2016-05-26 DIAGNOSIS — M1990 Unspecified osteoarthritis, unspecified site: Secondary | ICD-10-CM | POA: Diagnosis not present

## 2016-06-30 ENCOUNTER — Ambulatory Visit: Payer: Medicare Other

## 2016-07-04 ENCOUNTER — Telehealth: Payer: Self-pay | Admitting: Cardiology

## 2016-07-04 NOTE — Telephone Encounter (Signed)
New message  Pt call requesting to speak with RN. Pt states she needs [facility], but would like to speak with RN first. Please call back to discuss

## 2016-07-04 NOTE — Telephone Encounter (Signed)
Attempted to call patient. Hung up after several rings as no VM picked up to leave message.  Will try again later. 

## 2016-07-07 NOTE — Telephone Encounter (Signed)
Attempted to call patient. Hung up after several rings as no VM picked up to leave message.  Will try again later. 

## 2016-07-14 ENCOUNTER — Ambulatory Visit
Admission: RE | Admit: 2016-07-14 | Discharge: 2016-07-14 | Disposition: A | Discharge status: Home / Self Care | Payer: Medicare Other | Source: Ambulatory Visit | Attending: Internal Medicine | Admitting: Internal Medicine

## 2016-07-14 DIAGNOSIS — Z1231 Encounter for screening mammogram for malignant neoplasm of breast: Secondary | ICD-10-CM

## 2016-07-14 HISTORY — DX: Malignant neoplasm of unspecified site of unspecified female breast: C50.919

## 2016-07-19 MED ORDER — LISINOPRIL 10 MG PO TABS: 10.0000 mg | ORAL_TABLET | Freq: Every day | ORAL | 3 refills | Status: DC

## 2016-07-19 NOTE — Telephone Encounter (Signed)
Patient called to request Lisinopril called to [facility]. Rx sent. Patient was grateful for assistance.

## 2016-07-19 NOTE — Telephone Encounter (Signed)
Left message to call back if patient still requires assistance.

## 2016-07-19 NOTE — Addendum Note (Signed)
Addended by: Harland German A on: 07/19/2016 10:08 AM   Modules accepted: Orders

## 2016-08-01 DIAGNOSIS — Z961 Presence of intraocular lens: Secondary | ICD-10-CM | POA: Diagnosis not present

## 2016-08-01 DIAGNOSIS — H00019 Hordeolum externum unspecified eye, unspecified eyelid: Secondary | ICD-10-CM | POA: Diagnosis not present

## 2016-09-16 DIAGNOSIS — N390 Urinary tract infection, site not specified: Secondary | ICD-10-CM | POA: Diagnosis not present

## 2016-09-16 DIAGNOSIS — R300 Dysuria: Secondary | ICD-10-CM | POA: Diagnosis not present

## 2016-09-22 DIAGNOSIS — N390 Urinary tract infection, site not specified: Secondary | ICD-10-CM | POA: Diagnosis not present

## 2016-09-22 DIAGNOSIS — R8299 Other abnormal findings in urine: Secondary | ICD-10-CM | POA: Diagnosis not present

## 2016-09-24 DIAGNOSIS — J189 Pneumonia, unspecified organism: Secondary | ICD-10-CM | POA: Diagnosis not present

## 2016-09-29 DIAGNOSIS — I10 Essential (primary) hypertension: Secondary | ICD-10-CM | POA: Diagnosis not present

## 2016-09-29 DIAGNOSIS — Z6823 Body mass index (BMI) 23.0-23.9, adult: Secondary | ICD-10-CM | POA: Diagnosis not present

## 2016-09-29 DIAGNOSIS — J449 Chronic obstructive pulmonary disease, unspecified: Secondary | ICD-10-CM | POA: Diagnosis not present

## 2016-09-29 DIAGNOSIS — J189 Pneumonia, unspecified organism: Secondary | ICD-10-CM | POA: Diagnosis not present

## 2016-10-04 DIAGNOSIS — R8299 Other abnormal findings in urine: Secondary | ICD-10-CM | POA: Diagnosis not present

## 2016-10-04 DIAGNOSIS — N390 Urinary tract infection, site not specified: Secondary | ICD-10-CM | POA: Diagnosis not present

## 2016-10-04 DIAGNOSIS — J479 Bronchiectasis, uncomplicated: Secondary | ICD-10-CM | POA: Diagnosis not present

## 2016-10-04 DIAGNOSIS — J189 Pneumonia, unspecified organism: Secondary | ICD-10-CM | POA: Diagnosis not present

## 2016-10-04 DIAGNOSIS — R5383 Other fatigue: Secondary | ICD-10-CM | POA: Diagnosis not present

## 2016-10-04 DIAGNOSIS — Z6823 Body mass index (BMI) 23.0-23.9, adult: Secondary | ICD-10-CM | POA: Diagnosis not present

## 2016-10-04 DIAGNOSIS — J449 Chronic obstructive pulmonary disease, unspecified: Secondary | ICD-10-CM | POA: Diagnosis not present

## 2016-10-04 DIAGNOSIS — D6489 Other specified anemias: Secondary | ICD-10-CM | POA: Diagnosis not present

## 2016-10-04 DIAGNOSIS — J849 Interstitial pulmonary disease, unspecified: Secondary | ICD-10-CM | POA: Diagnosis not present

## 2016-10-05 ENCOUNTER — Telehealth: Payer: Self-pay | Admitting: Cardiology

## 2016-10-05 DIAGNOSIS — N390 Urinary tract infection, site not specified: Secondary | ICD-10-CM | POA: Diagnosis not present

## 2016-10-05 DIAGNOSIS — L270 Generalized skin eruption due to drugs and medicaments taken internally: Secondary | ICD-10-CM | POA: Diagnosis not present

## 2016-10-05 DIAGNOSIS — Z6823 Body mass index (BMI) 23.0-23.9, adult: Secondary | ICD-10-CM | POA: Diagnosis not present

## 2016-10-05 DIAGNOSIS — J479 Bronchiectasis, uncomplicated: Secondary | ICD-10-CM | POA: Diagnosis not present

## 2016-10-05 NOTE — Telephone Encounter (Signed)
New Message   Pt states that she has been sick with pneumonia and her BP has gone down. She wants to ask per Dr. Theodosia Blender recommendations should she continue the medication Metoprolol or cut it in half. Requests call back.

## 2016-10-05 NOTE — Telephone Encounter (Signed)
Patient stated that her BP has been low since she got PNA and wondered if she should be taking her metoprolol. Patient stated yesterday at her PCP's office her BP was 90/69 and this morning it was 115/75. Patient stated her HR is fine, but she did not have any readings. Informed patient that message would be sent to Dr. Radford Pax for advisement. Patient verbalized understanding.

## 2016-10-05 NOTE — Telephone Encounter (Signed)
Patient aware of Dr.Turner's recommendations to STOP Lisinopril, HOLD Metoprolol today, pt is to resume Metoprolol tomorrow, she is to monitor her BP daily for a week and call to report her readings. Adv pt to call back sooner if any concerns. Pt agreeable with plan and verbalized understanding.

## 2016-10-05 NOTE — Telephone Encounter (Signed)
Stop Lisinopril.  Hold Toprol today then restart Toprol tomorrow.  Check BP daily for a week and call with results

## 2016-10-06 ENCOUNTER — Telehealth: Payer: Self-pay | Admitting: Cardiology

## 2016-10-06 NOTE — Telephone Encounter (Signed)
New Message  Pt call requesting to speak with RN. Pt states she has been sick and would like to explain to the RN. Please call back to discuss

## 2016-10-06 NOTE — Telephone Encounter (Signed)
Pt  was calling this am due to rapid heart rate just walking to bathroom . Pt called yesterday because of low b/p was told to stop Lisinopril and hold Metoprolol yesterday has since taken metoprolol today and is starting to feel better.Encouraged pt to continue  to monitor and call with readings Pt aware  that holding the metoprolol yesterday may have been the culprit with episode this am agrees with plan and will call next week with readings as discussed.Will forward to Dr Radford Pax for review .Adonis Housekeeper

## 2016-10-07 DIAGNOSIS — N3000 Acute cystitis without hematuria: Secondary | ICD-10-CM | POA: Diagnosis not present

## 2016-10-12 DIAGNOSIS — N952 Postmenopausal atrophic vaginitis: Secondary | ICD-10-CM | POA: Diagnosis not present

## 2016-10-12 DIAGNOSIS — R300 Dysuria: Secondary | ICD-10-CM | POA: Diagnosis not present

## 2016-10-14 NOTE — Telephone Encounter (Signed)
Follow up    Pt states that she is calling to report her BP for the week. Pt states she has not taken her lisinopril like she was instructed.  9/19-133/74 p-78 9/20-135/79 p-67 9/21-115/71 p-74 9/22-122/69 p-58 9/23-119/70 p-65 9/24-138/76 p-65 9/25-116/73 p-60 9/26-131/75 p-65 9/27-123/71 p-62

## 2016-10-17 NOTE — Telephone Encounter (Signed)
Informed the pt that Dr Radford Pax reviewed her logged BP and HR readings, and advises the pt to continue taking metoprolol and continue holding lisinopril.  Pt verbalized understanding and agrees with this plan.

## 2016-10-17 NOTE — Telephone Encounter (Signed)
Continue metoprolol and continue to hold lisinopril

## 2016-10-20 ENCOUNTER — Ambulatory Visit (INDEPENDENT_AMBULATORY_CARE_PROVIDER_SITE_OTHER): Payer: Medicare Other | Admitting: Ophthalmology

## 2016-11-01 ENCOUNTER — Ambulatory Visit (INDEPENDENT_AMBULATORY_CARE_PROVIDER_SITE_OTHER): Payer: Medicare Other | Admitting: Pulmonary Disease

## 2016-11-01 ENCOUNTER — Other Ambulatory Visit: Payer: Self-pay | Admitting: Pulmonary Disease

## 2016-11-01 ENCOUNTER — Encounter: Payer: Self-pay | Admitting: Pulmonary Disease

## 2016-11-01 ENCOUNTER — Ambulatory Visit
Admission: RE | Admit: 2016-11-01 | Discharge: 2016-11-01 | Disposition: A | Discharge status: Home / Self Care | Payer: Self-pay | Source: Ambulatory Visit | Attending: Pulmonary Disease | Admitting: Pulmonary Disease

## 2016-11-01 VITALS — BP 122/66 | HR 54 | Ht 65.0 in | Wt 140.6 lb

## 2016-11-01 DIAGNOSIS — J438 Other emphysema: Secondary | ICD-10-CM

## 2016-11-01 NOTE — Progress Notes (Signed)
[name] [name]    [id number]    10/24/1930  Primary Care Physician:Perini, Elta Guadeloupe, MD  Referring Physician: Crist Infante, [address] [place], [place] [address]  Chief complaint:  Consult for evaluation of COPD, bronchiectasis.  HPI:  83 year old with history of breast cancer, COPD, hypertension, PVCs. She had an episode of pneumonia 5 weeks ago with fevers which was treated with doxycycline. She also got a course of Macrobid for recurrent urinary tract infection. CT scan at that time showed evidence of emphysema, bronchiectasis mostly in the right middle lobe with consolidation and nodular opacities. She has been referred to [facility] for further evaluation  She has remote history of smoking. Quit 30 years ago but had been exposed to significant amounts of secondhand smoke. 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 She has chronic dyspnea on exertion. Denies any cough, sputum production, fevers, chills. She has been prescribed Breo and Advair in the past but is not taking these. She just uses albuterol which helps with her breathing. There is no significant travel or exposure history.  Outpatient Encounter Prescriptions as of 11/01/2016  Medication Sig  . Albuterol Sulfate (PROAIR RESPICLICK) 867 (90 Base) MCG/ACT AEPB Inhale 1 puff into the lungs every 6 (six) hours as needed (wheezing).  [name] amoxicillin (AMOXIL) 500 MG capsule Take 2,000 mg by mouth See admin instructions. Pt takes prior to dental appointments  . aspirin EC 81 MG tablet Take 81 mg by mouth daily.  [name] atorvastatin (LIPITOR) 20 MG tablet Take 20 mg by mouth every evening.   [name] BIOTIN PO Take 1 tablet by mouth daily as needed (supplement).  . diphenoxylate-atropine (LOMOTIL) 2.5-0.025 MG per tablet Take 1 tablet by mouth 4 (four) times daily as needed for diarrhea or loose stools.  [name] escitalopram (LEXAPRO) 10 MG tablet Take 10 mg by mouth every morning.  . eszopiclone (LUNESTA) 2 MG TABS tablet  Take 1 mg by mouth at bedtime as needed for sleep.   [name] Fluticasone-Salmeterol (ADVAIR DISKUS) 250-50 MCG/DOSE AEPB Advair Diskus 250 mcg-50 mcg/dose powder for inhalation  . LORazepam (ATIVAN) 0.5 MG tablet Take 0.25 mg by mouth every 8 (eight) hours as needed for anxiety.  . metoprolol succinate (TOPROL XL) 50 MG 24 hr tablet Take 1 tablet (50 mg total) by mouth daily. Take with or immediately following a meal.  . Probiotic Product (ALIGN PO) Take 1 tablet by mouth daily.  [name] Propylene Glycol-Glycerin (SOOTHE) 0.6-0.6 % SOLN Apply to eye.  . Vitamin D, Ergocalciferol, (DRISDOL) 50000 UNITS CAPS capsule Take 50,000 Units by mouth 2 (two) times a week. Mondays   . [DISCONTINUED] HYDROcodone-acetaminophen (NORCO) 5-325 MG tablet Take 1 tablet by mouth every 6 (six) hours as needed for moderate pain.  . [DISCONTINUED] ketotifen (THERA TEARS ALLERGY) 0.025 % ophthalmic solution Place 1 drop into both eyes 2 (two) times daily as needed (Dry eyes).  . [DISCONTINUED] Loteprednol-Tobramycin (ZYLET OP) Place 1 drop into both eyes daily as needed (dry eyes).  . [DISCONTINUED] montelukast (SINGULAIR) 10 MG tablet Take 10 mg by mouth daily.  . [DISCONTINUED] Multiple Vitamin (MULTIVITAMIN WITH MINERALS) TABS tablet Take 1 tablet by mouth daily.  . [DISCONTINUED] NEXIUM 40 MG capsule Take 40 mg by mouth daily as needed (indigestion). For indigestion  . [DISCONTINUED] PROAIR HFA 108 (90 Base) MCG/ACT inhaler    No facility-administered encounter medications on file as of 11/01/2016.     Allergies as of 11/01/2016 -  Review Complete 11/01/2016  Allergen Reaction Noted  . Other  09/04/2013  . Cardizem [diltiazem hcl] Other (See Comments) 12/17/2012    Past Medical History:  Diagnosis Date  . Anxiety   . Bradycardia    asymptomatic  . Breast CA ([place]) 1993 OR 1994   LEFT, SURGERY AND RADIATION DONE  . Breast cancer ([place])   . COPD (chronic obstructive pulmonary disease) (HCC)    MILD, NO INHALERS USED    . DEGENERATIVE JOINT DISEASE, KNEE   . Family history of anesthesia complication    NEPHEW NAUSEA/VOMITING  . GENU VALGUM   . Hyperlipidemia   . Hypertension   . Numbness of leg 2011   just left shin  . 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 Pneumonia 2013   X 2  . PVC's (premature ventricular contractions)   . ROTATOR CUFF SYNDROME, LEFT    CANNOT LIFT LEFT ARM ALL THE WAY UP  . UNEQUAL LEG LENGTH   . UTI (urinary tract infection)    STARTED AUGMENTIN  ON 09-10-13    Past Surgical History:  Procedure Laterality Date  . APPENDECTOMY  AGE 3  . BREAST LUMPECTOMY Left 1991   radiation  . BREAST SURGERY Left 1993 OR 1994   LUMPECTOMY AND RADIATION DONE  . JOINT REPLACEMENT    . NASAL SINUS SURGERY  2006  . REVERSE SHOULDER ARTHROPLASTY Left 07/31/2015   Procedure: LEFT REVERSE SHOULDER ARTHROPLASTY;  Surgeon: Netta Cedars, MD;  Location: [facility];  Service: Orthopedics;  Laterality: Left;  . TOTAL HIP ARTHROPLASTY Bilateral LEFT 2008 AND RIGHT 2010  . TOTAL KNEE ARTHROPLASTY Left 09/17/2013   Procedure: LEFT TOTAL KNEE ARTHROPLASTY;  Surgeon: Mauri Pole, MD;  Location: [facility];  Service: Orthopedics;  Laterality: Left;    Family History  Problem Relation Age of Onset  . Diabetes Mother   . Heart Problems Father   . Hypertension Brother     Social History   Social History  . Marital status: Widowed    Spouse name: N/A  . Number of children: N/A  . Years of education: N/A   Occupational History  . Not on file.   Social History Main Topics  . Smoking status: Former Smoker    Packs/day: 0.25    Years: 15.00    Types: Cigarettes    Quit date: 01/18/1992  . Smokeless tobacco: Never Used  . Alcohol use Yes     Comment: WINE 2 GLASSES PER DAY  . Drug use: No  . Sexual activity: Not on file   Other Topics Concern  . Not on file   Social History Narrative  . No narrative on file    Review of systems: Review of Systems  Constitutional: Negative  for fever and chills.  HENT: Negative.   Eyes: Negative for blurred vision.  Respiratory: as per HPI  Cardiovascular: Negative for chest pain and palpitations.  Gastrointestinal: Negative for vomiting, diarrhea, blood per rectum. Genitourinary: Negative for dysuria, urgency, frequency and hematuria.  Musculoskeletal: Negative for myalgias, back pain and joint pain.  Skin: Negative for itching and rash.  Neurological: Negative for dizziness, tremors, focal weakness, seizures and loss of consciousness.  Endo/Heme/Allergies: Negative for environmental allergies.  Psychiatric/Behavioral: Negative for depression, suicidal ideas and hallucinations.  All other systems reviewed and are negative.  Physical Exam: Blood pressure 122/66, pulse (!) 54, height 5\' 5"  (1.651 m), weight 140 lb 9.6 oz (63.8 kg), SpO2 95 %. Gen:  No acute distress HEENT:  EOMI, sclera anicteric Neck:     No masses; no thyromegaly Lungs:    Clear to auscultation bilaterally; normal respiratory effort CV:         Regular rate and rhythm; no murmurs Abd:      + bowel sounds; soft, non-tender; no palpable masses, no distension Ext:    No edema; adequate peripheral perfusion Skin:      Warm and dry; no rash Neuro: alert and oriented x 3 Psych: normal mood and affect  Data Reviewed: Chest x-ray 09/11/13- hyperinflation, scoliosis, no acute lung abnormality. CT chest 10/04/16-bronchiectasis, consolidation in the right middle lobe, thickening in the minor fissure 18-3 mm. I have reviewed these images personally CT chest 10/08/12-emphysematous changes with interstitial nodular scarring involving the right middle lobe and left lingula. These are unchanged compared to previous CT scan on 05/29/12 CT chest 05/29/12-hyperinflation with interstitial fibrotic changes, consolidation or bronchograms in the right middle lobe.  Assessment:  Bronchiectasis with nodular opacities in the right middle lobe She has brought her most recent CT  scan images from sept 2018 which I reviewed showing bronchiectasis with nodular opacities in the right middle lobe. There is no evidence of diffuse interstitial lung disease. She has CT scans dating back to 2014 which, at least by report, shows similar opacities and findings of bronchiectasis. I do not have the previous imaging to review. I suspect that these findings are chronic and benign. She drop off CDs of her prior CT scan for [place] to compare. She can get a follow-up CT scan in 6 months time to make sure that the opacities have not changed in size  Emphysema There are emphysematous changes on the CT scan. I have asked her to start taking breo on a regular basis. She'll continue on the albuterol inhaler as needed We will get pulmonary function test for further evaluation.  Plan/Recommendations: - Get old CT scan CD from 2014 for comparison - Follow up CT without contrast in 6 months - Start using the [place] every day. Use albuterol as needed - Check PFTs  Marshell Garfinkel MD [facility] Pager [phone] 11/01/2016, 3:25 PM  CC: Crist Infante, MD

## 2016-11-01 NOTE — Patient Instructions (Signed)
Please start taking the Breo once a day. Use the albuterol inhaler as a rescue inhaler to be used as needed Please see if you can drop off the images of old CT scan so that we can compare it to the one from this year. We will schedule you for pulmonary function tests for further evaluation of her lung function Return to clinic in early January for review and further follow-up as needed.

## 2016-11-02 ENCOUNTER — Other Ambulatory Visit: Payer: Self-pay | Admitting: *Deleted

## 2016-11-02 ENCOUNTER — Telehealth: Payer: Self-pay | Admitting: Pulmonary Disease

## 2016-11-02 ENCOUNTER — Ambulatory Visit
Admission: RE | Admit: 2016-11-02 | Discharge: 2016-11-02 | Disposition: A | Discharge status: Home / Self Care | Payer: Self-pay | Source: Ambulatory Visit | Attending: Pulmonary Disease | Admitting: Pulmonary Disease

## 2016-11-02 DIAGNOSIS — J438 Other emphysema: Secondary | ICD-10-CM

## 2016-11-02 NOTE — Telephone Encounter (Signed)
Dr Vaughan Browner,  I see that you had requested she bring the disc and so I placed it on your desk in your office.

## 2016-11-03 DIAGNOSIS — J189 Pneumonia, unspecified organism: Secondary | ICD-10-CM | POA: Diagnosis not present

## 2016-11-03 NOTE — Telephone Encounter (Signed)
OK. Thanks. I will review

## 2016-11-07 ENCOUNTER — Encounter (INDEPENDENT_AMBULATORY_CARE_PROVIDER_SITE_OTHER): Payer: Medicare Other | Admitting: Ophthalmology

## 2016-11-07 DIAGNOSIS — H353132 Nonexudative age-related macular degeneration, bilateral, intermediate dry stage: Secondary | ICD-10-CM | POA: Diagnosis not present

## 2016-11-07 DIAGNOSIS — I10 Essential (primary) hypertension: Secondary | ICD-10-CM

## 2016-11-07 DIAGNOSIS — H35033 Hypertensive retinopathy, bilateral: Secondary | ICD-10-CM

## 2016-11-07 DIAGNOSIS — H43813 Vitreous degeneration, bilateral: Secondary | ICD-10-CM

## 2016-11-25 ENCOUNTER — Other Ambulatory Visit: Payer: Self-pay | Admitting: Cardiology

## 2016-12-06 NOTE — Telephone Encounter (Signed)
Pt called and given recommendations from Dr. Vaughan Browner. Nothing further needed. Pt verbalized understanding and all questions answered.

## 2016-12-06 NOTE — Telephone Encounter (Signed)
Please let patient know that I have reviewed her CT scan CDs that she dropped off.  I have compared the scan from 2018 with old scan from 2014. The changes in the lung are stable from 2014 to now and are likely benign.  I do not recommend any treatment for this.  We will continue to follow I will discuss this again at follow-up clinic visit.

## 2016-12-12 ENCOUNTER — Telehealth: Payer: Self-pay | Admitting: Cardiology

## 2016-12-12 NOTE — Telephone Encounter (Signed)
Returned call to patient, pt stated that last night BP was 127/70, when she woke up this morning her BP was 190/92 before taking her morning medication. Patient is asymptomatic. She said that she had around 20 people over her house for the holidays, has been eating and had a few more glasses of wine for the holidays. Instructed the patient to take her morning meds, get some rest and recheck BP in an hour after taking meds and call back with reading. Patient asked if she could go exercise, I advised her to make sure BP is well controlled before going to to exercise. Patient verbalized understanding and thanked me for the call.

## 2016-12-12 NOTE — Telephone Encounter (Signed)
Patient returned call with BP 139/93 HR 79 after taking BP meds. Instructed the patient to continue to monitor BP readings and call back if readings remain elevated after taking meds or if she has any new symptoms. Patient is scheduled to see Dr. Radford Pax on 02/13/17. Patient in agreement with plan.

## 2016-12-12 NOTE — Telephone Encounter (Signed)
New message     Pt c/o BP issue: STAT if pt c/o blurred vision, one-sided weakness or slurred speech  1. What are your last 5 BP readings?  190/92 before her medication   2. Are you having any other symptoms (ex. Dizziness, headache, blurred vision, passed out)?  no  3. What is your BP issue? Concerned because bp was so high before medication

## 2016-12-19 ENCOUNTER — Telehealth: Payer: Self-pay | Admitting: Cardiology

## 2016-12-19 NOTE — Telephone Encounter (Signed)
Returned call to patient. Patient stated she would like to make Dr. Radford Pax aware of BP readings and is asking if she should make any adjustments to her meds. Patient also states that she has been stressed out this week and she takes ativan 0.25 mg once a day for her nerves. I informed patient that BP readings are good and she should focus more stress management. Patient denies any other symptoms.   12/12/16 before medication 195/97, after taking meds 139/83 HR 79  12/13/16 133/79  12/14/16 154/89 15 mins later 131/79 HR 71  12/15/16 135/73 HR 59  12/16/16 143/84 15 mins later 127/76 HR 60  12/17/16 129/75 HR 62  12/18/16 149/84 15 mins later 119/70 HR 59   Informed patient I would route to Dr. Radford Pax. Patient verbalized understanding and thanked me for the call.

## 2016-12-19 NOTE — Telephone Encounter (Signed)
Mrs. Sindoni is calling to give you her blood readings. Please call

## 2016-12-20 NOTE — Telephone Encounter (Signed)
Informed patient that Dr. Radford Pax is okay with BP readings and to follow up with PCP for stress management. Patient verbalized understanding and thanked me for the call.

## 2016-12-20 NOTE — Telephone Encounter (Signed)
Needs to talk with PCP. About stress management

## 2017-01-04 ENCOUNTER — Telehealth: Payer: Self-pay | Admitting: Cardiology

## 2017-01-04 DIAGNOSIS — I10 Essential (primary) hypertension: Secondary | ICD-10-CM | POA: Diagnosis not present

## 2017-01-04 DIAGNOSIS — Z6824 Body mass index (BMI) 24.0-24.9, adult: Secondary | ICD-10-CM | POA: Diagnosis not present

## 2017-01-04 DIAGNOSIS — F419 Anxiety disorder, unspecified: Secondary | ICD-10-CM | POA: Diagnosis not present

## 2017-01-04 DIAGNOSIS — J449 Chronic obstructive pulmonary disease, unspecified: Secondary | ICD-10-CM | POA: Diagnosis not present

## 2017-01-04 NOTE — Telephone Encounter (Signed)
Discussed with patient, she verbalized understanding, will try to get in to see her PCP today.

## 2017-01-04 NOTE — Telephone Encounter (Addendum)
Pt called this AM with BP issues. Pt states that her BP yesterday  in the AM was 141/80, at 6 PM 164/92 and after 2 glasses of wine at 8:40 pm 134/77. This morning at 8:20 AM an hour after taken her Toprol XL 50 mg  BP was 162/102 HR 58 beats/minute. Now at 9:10 AM BP  173/99, HR 74 beats/minute. Pt denies other symptoms, except to be very nervous. Pt took her Ativan medication this AM. Pt states that she has been holding her Lisinopril 10 mg tablet since 10/05/16, because her BP has running low. Pt wants to know if she can restart her Lisinopril or take an extra Toprol XL.

## 2017-01-04 NOTE — Telephone Encounter (Signed)
Pt c/o BP issue: STAT if pt c/o blurred vision, one-sided weakness or slurred speech  1. What are your last 5 BP readings? 12/18  141/80      5p    164/92    164/92  5:05p     134/77 8:40p      Today 162/102   8:20a  2. Are you having any other symptoms (ex. Dizziness, headache, blurred vision, passed out) while working out got a little dizzy yesterday   3. What is your BP issue? yes

## 2017-01-04 NOTE — Telephone Encounter (Signed)
I would like her to go see her PCP today - I think a lot of her BP issues are related to anxiety

## 2017-01-31 ENCOUNTER — Ambulatory Visit (INDEPENDENT_AMBULATORY_CARE_PROVIDER_SITE_OTHER): Payer: Medicare Other | Admitting: Pulmonary Disease

## 2017-01-31 ENCOUNTER — Encounter: Payer: Self-pay | Admitting: Pulmonary Disease

## 2017-01-31 VITALS — BP 142/78 | HR 70 | Ht 65.0 in | Wt 141.0 lb

## 2017-01-31 DIAGNOSIS — J438 Other emphysema: Secondary | ICD-10-CM | POA: Diagnosis not present

## 2017-01-31 LAB — PULMONARY FUNCTION TEST
DL/VA % pred: 78 %
DL/VA: 3.86 ml/min/mmHg/L
DLCO COR % PRED: 48 %
DLCO UNC: 11.91 ml/min/mmHg
DLCO cor: 12.53 ml/min/mmHg
DLCO unc % pred: 46 %
FEF 25-75 POST: 1.55 L/s
FEF 25-75 Pre: 0.83 L/sec
FEF2575-%Change-Post: 87 %
FEF2575-%PRED-PRE: 68 %
FEF2575-%Pred-Post: 128 %
FEV1-%Change-Post: 17 %
FEV1-%Pred-Post: 81 %
FEV1-%Pred-Pre: 69 %
FEV1-Post: 1.52 L
FEV1-Pre: 1.3 L
FEV1FVC-%Change-Post: 3 %
FEV1FVC-%Pred-Pre: 96 %
FEV6-%CHANGE-POST: 13 %
FEV6-%PRED-POST: 88 %
FEV6-%PRED-PRE: 77 %
FEV6-POST: 2.08 L
FEV6-Pre: 1.84 L
FEV6FVC-%PRED-POST: 106 %
FEV6FVC-%Pred-Pre: 106 %
FVC-%CHANGE-POST: 13 %
FVC-%Pred-Post: 82 %
FVC-%Pred-Pre: 73 %
FVC-POST: 2.08 L
FVC-Pre: 1.84 L
POST FEV6/FVC RATIO: 100 %
PRE FEV1/FVC RATIO: 70 %
Post FEV1/FVC ratio: 73 %
Pre FEV6/FVC Ratio: 100 %
RV % pred: 96 %
RV: 2.46 L
TLC % pred: 84 %
TLC: 4.37 L

## 2017-01-31 MED ORDER — FLUTTER DEVI: 0 refills | Status: DC

## 2017-01-31 NOTE — Progress Notes (Signed)
PFT done today. 

## 2017-01-31 NOTE — Patient Instructions (Signed)
I have reviewed your CT scan and PFTs.  The changes in the lung had remained stable from 2014 It is okay to not use the inhaler if it is not helping If there is increased congestion in the chest you can use Mucinex over-the-counter and flutter valve  Return in 6 months.

## 2017-01-31 NOTE — Progress Notes (Signed)
[name] [name]    [id number]    May 21, 1930  Primary Care Physician:Perini, Elta Guadeloupe, MD  Referring Physician: Crist Infante, [address] [place], [place] [address]  Chief complaint:  Follow up for COPD, bronchiectasis.  HPI:  84 year old with history of breast cancer, COPD, hypertension, PVCs. She had an episode of pneumonia 5 weeks ago with fevers which was treated with doxycycline. She also got a course of Macrobid for recurrent urinary tract infection. CT scan at that time showed evidence of emphysema, bronchiectasis mostly in the right middle lobe with consolidation and nodular opacities. She has been referred to [facility] for further evaluation  She has remote history of smoking. Quit 30 years ago but had been exposed to significant amounts of secondhand smoke. 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 She has chronic dyspnea on exertion. Denies any cough, sputum production, fevers, chills. She has been prescribed Breo and Advair in the past but is not taking these. She just uses albuterol which helps with her breathing. There is no significant travel or exposure history.  Interim history: Not taking her Breo inhaler as she feels that she does not need them.  Breathing is stable with no dyspnea, cough, sputum production.  Outpatient Encounter Medications as of 01/31/2017  Medication Sig  . Albuterol Sulfate (PROAIR RESPICLICK) 101 (90 Base) MCG/ACT AEPB Inhale 1 puff into the lungs every 6 (six) hours as needed (wheezing).  [name] amoxicillin (AMOXIL) 500 MG capsule Take 2,000 mg by mouth See admin instructions. Pt takes prior to dental appointments  . aspirin EC 81 MG tablet Take 81 mg by mouth daily.  [name] atorvastatin (LIPITOR) 20 MG tablet Take 20 mg by mouth every evening.   [name] BIOTIN PO Take 1 tablet by mouth daily as needed (supplement).  . diphenoxylate-atropine (LOMOTIL) 2.5-0.025 MG per tablet Take 1 tablet by mouth 4 (four) times daily as needed for  diarrhea or loose stools.  [name] escitalopram (LEXAPRO) 10 MG tablet Take 10 mg by mouth every morning.  . eszopiclone (LUNESTA) 2 MG TABS tablet Take 1 mg by mouth at bedtime as needed for sleep.   [name] Fluticasone-Salmeterol (ADVAIR DISKUS) 250-50 MCG/DOSE AEPB Advair Diskus 250 mcg-50 mcg/dose powder for inhalation  . LORazepam (ATIVAN) 0.5 MG tablet Take 0.25 mg by mouth every 8 (eight) hours as needed for anxiety.  . metoprolol succinate (TOPROL-XL) 50 MG 24 hr tablet TAKE 1 TABLET DAILY. TAKE WITH OR IMMEDIATELY FOLLOWING A MEAL.  . Probiotic Product (ALIGN PO) Take 1 tablet by mouth daily.  [name] Propylene Glycol-Glycerin (SOOTHE) 0.6-0.6 % SOLN Apply to eye.  . Vitamin D, Ergocalciferol, (DRISDOL) 50000 UNITS CAPS capsule Take 50,000 Units by mouth 2 (two) times a week. Mondays    No facility-administered encounter medications on file as of 01/31/2017.     Allergies as of 01/31/2017 - Review Complete 01/31/2017  Allergen Reaction Noted  . Other  09/04/2013  . Cardizem [diltiazem hcl] Other (See Comments) 12/17/2012    Past Medical History:  Diagnosis Date  . Anxiety   . Bradycardia    asymptomatic  . Breast CA ([place]) 1993 OR 1994   LEFT, SURGERY AND RADIATION DONE  . Breast cancer ([place])   . COPD (chronic obstructive pulmonary disease) (HCC)    MILD, NO INHALERS USED  . DEGENERATIVE JOINT DISEASE, KNEE   . Family history of anesthesia complication    NEPHEW NAUSEA/VOMITING  . GENU VALGUM   .  Hyperlipidemia   . Hypertension   . Numbness of leg 2011   just left shin  . 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 Pneumonia 2013   X 2  . PVC's (premature ventricular contractions)   . ROTATOR CUFF SYNDROME, LEFT    CANNOT LIFT LEFT ARM ALL THE WAY UP  . UNEQUAL LEG LENGTH   . UTI (urinary tract infection)    STARTED AUGMENTIN  ON 09-10-13    Past Surgical History:  Procedure Laterality Date  . APPENDECTOMY  AGE 43  . BREAST LUMPECTOMY Left 1991   radiation  . BREAST  SURGERY Left 1993 OR 1994   LUMPECTOMY AND RADIATION DONE  . JOINT REPLACEMENT    . NASAL SINUS SURGERY  2006  . REVERSE SHOULDER ARTHROPLASTY Left 07/31/2015   Procedure: LEFT REVERSE SHOULDER ARTHROPLASTY;  Surgeon: Netta Cedars, MD;  Location: [facility];  Service: Orthopedics;  Laterality: Left;  . TOTAL HIP ARTHROPLASTY Bilateral LEFT 2008 AND RIGHT 2010  . TOTAL KNEE ARTHROPLASTY Left 09/17/2013   Procedure: LEFT TOTAL KNEE ARTHROPLASTY;  Surgeon: Mauri Pole, MD;  Location: [facility];  Service: Orthopedics;  Laterality: Left;    Family History  Problem Relation Age of Onset  . Diabetes Mother   . Heart Problems Father   . Hypertension Brother     Social History   Socioeconomic History  . Marital status: Widowed    Spouse name: Not on file  . Number of children: Not on file  . Years of education: Not on file  . Highest education level: Not on file  Social Needs  . Financial resource strain: Not on file  . Food insecurity - worry: Not on file  . Food insecurity - inability: Not on file  . Transportation needs - medical: Not on file  . Transportation needs - non-medical: Not on file  Occupational History  . Not on file  Tobacco Use  . Smoking status: Former Smoker    Packs/day: 0.25    Years: 15.00    Pack years: 3.75    Types: Cigarettes    Last attempt to quit: 01/18/1992    Years since quitting: 25.0  . Smokeless tobacco: Never Used  Substance and Sexual Activity  . Alcohol use: Yes    Comment: WINE 2 GLASSES PER DAY  . Drug use: No  . Sexual activity: Not on file  Other Topics Concern  . Not on file  Social History Narrative  . Not on file    Review of systems: Review of Systems  Constitutional: Negative for fever and chills.  HENT: Negative.   Eyes: Negative for blurred vision.  Respiratory: as per HPI  Cardiovascular: Negative for chest pain and palpitations.  Gastrointestinal: Negative for vomiting, diarrhea, blood per rectum. Genitourinary: Negative for  dysuria, urgency, frequency and hematuria.  Musculoskeletal: Negative for myalgias, back pain and joint pain.  Skin: Negative for itching and rash.  Neurological: Negative for dizziness, tremors, focal weakness, seizures and loss of consciousness.  Endo/Heme/Allergies: Negative for environmental allergies.  Psychiatric/Behavioral: Negative for depression, suicidal ideas and hallucinations.  All other systems reviewed and are negative.  Physical Exam: Blood pressure (!) 142/78, pulse 70, height 5\' 5"  (1.651 m), weight 141 lb (64 kg), SpO2 100 %. Gen:      No acute distress HEENT:  EOMI, sclera anicteric Neck:     No masses; no thyromegaly Lungs:    Clear to auscultation bilaterally; normal respiratory effort CV:  Regular rate and rhythm; no murmurs Abd:      + bowel sounds; soft, non-tender; no palpable masses, no distension Ext:    No edema; adequate peripheral perfusion Skin:      Warm and dry; no rash Neuro: alert and oriented x 3 Psych: normal mood and affect  Data Reviewed: CT chest 05/29/12-hyperinflation with interstitial fibrotic changes, consolidation or bronchograms in the right middle lobe. CT chest 10/08/12-emphysematous changes with interstitial nodular scarring involving the right middle lobe and left lingula. These are unchanged compared to previous CT scan on 05/29/12 Chest x-ray 09/11/13- hyperinflation, scoliosis, no acute lung abnormality. CT chest 10/04/16-bronchiectasis, consolidation in the right middle lobe, thickening in the minor fissure 18-3 mm. I have reviewed all images personally.  PFTs 01/31/17 FVC 2.08 [82%], FEV1 1.52 [81%], F/F 73, TLC 84%, DLCO 46% Mild obstructive airway disease with bronchodilator response, moderate reduction in diffusion capacity.  Assessment:  Bronchiectasis with nodular opacities in the right middle lobe Reviewed her CT scans from 2014 to present showing mild bronchiectasis with nodular opacities in the right middle lobe.   These findings have remained stable and there is no evidence of diffuse interstitial lung disease. Use Mucinex and flutter wall as needed for clearance of secretion.  Emphysema There are emphysematous changes on the CT scan.  PFTs reviewed which shows mild obstruction with a bronchodilator response.  She may benefit from regular use of controller medication but she prefers to use inahlers on a as needed basis.  Plan/Recommendations: - Use inhalers as needed - Mucinex, flutter valve for clearance of secretion.  Marshell Garfinkel MD [facility] Pager [phone] 01/31/2017, 2:20 PM  CC: Crist Infante, MD

## 2017-02-12 NOTE — Progress Notes (Deleted)
Cardiology Office Note:    Date:  02/12/2017   ID:  [name] [name], DOB 1930/05/02, MRN [id number]  PCP:  Crist Infante, MD  Cardiologist:  No primary care provider on file.    Referring MD: Crist Infante, MD   No chief complaint on file.   History of Present Illness:    [name] [name] is a 84 y.o. female with a hx of PVC's and HTN.She has a lot of anxiety which drives her BP up.  She  is here today for followup and is doing well.  She denies any chest pain or pressure, SOB, DOE, PND, orthopnea, LE edema, dizziness, palpitations or syncope. She is compliant with her meds and is tolerating meds with no SE.      Past Medical History:  Diagnosis Date  . Anxiety   . Bradycardia    asymptomatic  . Breast CA ([facility]) 1993 OR 1994   LEFT, SURGERY AND RADIATION DONE  . Breast cancer ([place])   . COPD (chronic obstructive pulmonary disease) (HCC)    MILD, NO INHALERS USED  . DEGENERATIVE JOINT DISEASE, KNEE   . Family history of anesthesia complication    NEPHEW NAUSEA/VOMITING  . GENU VALGUM   . Hyperlipidemia   . Hypertension   . Numbness of leg 2011   just left shin  . 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 Pneumonia 2013   X 2  . PVC's (premature ventricular contractions)   . ROTATOR CUFF SYNDROME, LEFT    CANNOT LIFT LEFT ARM ALL THE WAY UP  . UNEQUAL LEG LENGTH   . UTI (urinary tract infection)    STARTED AUGMENTIN  ON 09-10-13    Past Surgical History:  Procedure Laterality Date  . APPENDECTOMY  AGE 14  . BREAST LUMPECTOMY Left 1991   radiation  . BREAST SURGERY Left 1993 OR 1994   LUMPECTOMY AND RADIATION DONE  . JOINT REPLACEMENT    . NASAL SINUS SURGERY  2006  . REVERSE SHOULDER ARTHROPLASTY Left 07/31/2015   Procedure: LEFT REVERSE SHOULDER ARTHROPLASTY;  Surgeon: Netta Cedars, MD;  Location: [facility];  Service: Orthopedics;  Laterality: Left;  . TOTAL HIP ARTHROPLASTY Bilateral LEFT 2008 AND RIGHT 2010  . TOTAL KNEE ARTHROPLASTY Left 09/17/2013   Procedure: LEFT TOTAL KNEE ARTHROPLASTY;  Surgeon: Mauri Pole, MD;  Location: [facility];  Service: Orthopedics;  Laterality: Left;    Current Medications: No outpatient medications have been marked as taking for the 02/13/17 encounter (Appointment) with Sueanne Margarita, MD.     Allergies:   Other and Cardizem [diltiazem hcl]   Social History   Socioeconomic History  . Marital status: Widowed    Spouse name: Not on file  . Number of children: Not on file  . Years of education: Not on file  . Highest education level: Not on file  Social Needs  . Financial resource strain: Not on file  . Food insecurity - worry: Not on file  . Food insecurity - inability: Not on file  . Transportation needs - medical: Not on file  . Transportation needs - non-medical: Not on file  Occupational History  . Not on file  Tobacco Use  . Smoking status: Former Smoker    Packs/day: 0.25    Years: 15.00    Pack years: 3.75    Types: Cigarettes    Last attempt to quit: 01/18/1992    Years since quitting: 25.0  . Smokeless tobacco: Never Used  Substance and Sexual Activity  . Alcohol use: Yes    Comment: WINE 2 GLASSES PER DAY  . Drug use: No  . Sexual activity: Not on file  Other Topics Concern  . Not on file  Social History Narrative  . Not on file     Family History: The patient's family history includes Diabetes in her mother; Heart Problems in her father; Hypertension in her brother.  ROS:   Please see the history of present illness.    ROS  All other systems reviewed and negative.   EKGs/Labs/Other Studies Reviewed:    The following studies were reviewed today: none  EKG:  EKG is  ordered today.  The ekg ordered today demonstrates ***  Recent Labs: No results found for requested labs within last 8760 hours.   Recent Lipid Panel No results found for: CHOL, TRIG, HDL, CHOLHDL, VLDL, LDLCALC, LDLDIRECT  Physical Exam:    VS:  There were no vitals taken for this visit.    Wt  Readings from Last 3 Encounters:  01/31/17 141 lb (64 kg)  11/01/16 140 lb 9.6 oz (63.8 kg)  12/25/15 138 lb (62.6 kg)     GEN:  Well nourished, well developed in no acute distress HEENT: Normal NECK: No JVD; No carotid bruits LYMPHATICS: No lymphadenopathy CARDIAC: RRR, no murmurs, rubs, gallops RESPIRATORY:  Clear to auscultation without rales, wheezing or rhonchi  ABDOMEN: Soft, non-tender, non-distended MUSCULOSKELETAL:  No edema; No deformity  SKIN: Warm and dry NEUROLOGIC:  Alert and oriented x 3 PSYCHIATRIC:  Normal affect   ASSESSMENT:    1. Essential hypertension   2. PVC's (premature ventricular contractions)    PLAN:    In order of problems listed above:  1.  HTN - BP is well controlled on exam today.  She wlil continue on Toprol XL 50mg  daily.  2.  PVC's - these are fairly well controlled on BB>   Medication Adjustments/Labs and Tests Ordered: Current medicines are reviewed at length with the patient today.  Concerns regarding medicines are outlined above.  No orders of the defined types were placed in this encounter.  No orders of the defined types were placed in this encounter.   Signed, Fransico Him, MD  02/12/2017 7:26 PM    [place]

## 2017-02-13 ENCOUNTER — Ambulatory Visit: Payer: Medicare Other | Admitting: Cardiology

## 2017-02-14 DIAGNOSIS — J309 Allergic rhinitis, unspecified: Secondary | ICD-10-CM | POA: Diagnosis not present

## 2017-02-14 DIAGNOSIS — I10 Essential (primary) hypertension: Secondary | ICD-10-CM | POA: Diagnosis not present

## 2017-02-14 DIAGNOSIS — Z6823 Body mass index (BMI) 23.0-23.9, adult: Secondary | ICD-10-CM | POA: Diagnosis not present

## 2017-02-14 DIAGNOSIS — J069 Acute upper respiratory infection, unspecified: Secondary | ICD-10-CM | POA: Diagnosis not present

## 2017-02-14 DIAGNOSIS — R05 Cough: Secondary | ICD-10-CM | POA: Diagnosis not present

## 2017-02-14 DIAGNOSIS — J449 Chronic obstructive pulmonary disease, unspecified: Secondary | ICD-10-CM | POA: Diagnosis not present

## 2017-02-14 NOTE — Progress Notes (Signed)
Cardiology Office Note:    Date:  02/12/2017   ID:  [name] [name], DOB Sep 07, 1930, MRN [id number]  PCP:  Crist Infante, MD           Cardiologist:  No primary care provider on file.    Referring MD: Crist Infante, MD   Chief Complaint: PVCs and HTN  History of Present Illness:    [name] [name] is a 84 y.o. female with a hx of PVC's and HTN.She has a lot of anxiety which drives her BP up.  She  is here today for followup and is doing well.  She denies any chest pain or pressure,PND, orthopnea, LE edema, dizziness, palpitations or syncope. She has chronic SOB from her COPD which is stable.  She is compliant with her meds and is tolerating meds with no SE.         Past Medical History:  Diagnosis Date  . Anxiety   . Bradycardia    asymptomatic  . Breast CA ([facility]) 1993 OR 1994   LEFT, SURGERY AND RADIATION DONE  . Breast cancer ([place] [facility])   . COPD (chronic obstructive pulmonary disease) (HCC)    MILD, NO INHALERS USED  . DEGENERATIVE JOINT DISEASE, KNEE   . Family history of anesthesia complication    NEPHEW NAUSEA/VOMITING  . GENU VALGUM   . Hyperlipidemia   . Hypertension   . Numbness of leg 2011   just left shin  . 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 Pneumonia 2013   X 2  . PVC's (premature ventricular contractions)   . ROTATOR CUFF SYNDROME, LEFT    CANNOT LIFT LEFT ARM ALL THE WAY UP  . UNEQUAL LEG LENGTH   . UTI (urinary tract infection)    STARTED AUGMENTIN  ON 09-10-13         Past Surgical History:  Procedure Laterality Date  . APPENDECTOMY  AGE 59  . BREAST LUMPECTOMY Left 1991   radiation  . BREAST SURGERY Left 1993 OR 1994   LUMPECTOMY AND RADIATION DONE  . JOINT REPLACEMENT    . NASAL SINUS SURGERY  2006  . REVERSE SHOULDER ARTHROPLASTY Left 07/31/2015   Procedure: LEFT REVERSE SHOULDER ARTHROPLASTY;  Surgeon: Netta Cedars, MD;  Location: [facility];  Service: Orthopedics;  Laterality: Left;  . TOTAL  HIP ARTHROPLASTY Bilateral LEFT 2008 AND RIGHT 2010  . TOTAL KNEE ARTHROPLASTY Left 09/17/2013   Procedure: LEFT TOTAL KNEE ARTHROPLASTY;  Surgeon: Mauri Pole, MD;  Location: [facility];  Service: Orthopedics;  Laterality: Left;    Current Medications: No outpatient medications have been marked as taking for the 02/13/17 encounter (Appointment) with Sueanne Margarita, MD.     Allergies:   Other and Cardizem [diltiazem hcl]   Social History        Socioeconomic History  . Marital status: Widowed    Spouse name: Not on file  . Number of children: Not on file  . Years of education: Not on file  . Highest education level: Not on file  Social Needs  . Financial resource strain: Not on file  . Food insecurity - worry: Not on file  . Food insecurity - inability: Not on file  . Transportation needs - medical: Not on file  . Transportation needs - non-medical: Not on file  Occupational History  . Not on file  Tobacco Use  . Smoking status: Former Smoker    Packs/day: 0.25    Years: 15.00  Pack years: 3.75    Types: Cigarettes    Last attempt to quit: 01/18/1992    Years since quitting: 25.0  . Smokeless tobacco: Never Used  Substance and Sexual Activity  . Alcohol use: Yes    Comment: WINE 2 GLASSES PER DAY  . Drug use: No  . Sexual activity: Not on file  Other Topics Concern  . Not on file  Social History Narrative  . Not on file     Family History: The patient's family history includes Diabetes in her mother; Heart Problems in her father; Hypertension in her brother.  ROS:   Please see the history of present illness.    ROS  All other systems reviewed and negative.   EKGs/Labs/Other Studies Reviewed:    The following studies were reviewed today: none  EKG:  EKG is  ordered today.  The ekg ordered today demonstrates NSR at 67bpm with nonspecific ST abnormality  Recent Labs: No results found for requested labs within last 8760 hours.    Recent Lipid Panel No results found for: CHOL, TRIG, HDL, CHOLHDL, VLDL, LDLCALC, LDLDIRECT  Physical Exam:    VS:  BP 11/81mmHg and HR 67bpm      Wt Readings from Last 3 Encounters:  01/31/17 141 lb (64 kg)  11/01/16 140 lb 9.6 oz (63.8 kg)  12/25/15 138 lb (62.6 kg)     GEN:  Well nourished, well developed in no acute distress HEENT: Normal NECK: No JVD; No carotid bruits LYMPHATICS: No lymphadenopathy CARDIAC: RRR, no murmurs, rubs, gallops RESPIRATORY:  Clear to auscultation without rales, wheezing or rhonchi  ABDOMEN: Soft, non-tender, non-distended MUSCULOSKELETAL:  No edema; No deformity  SKIN: Warm and dry NEUROLOGIC:  Alert and oriented x 3 PSYCHIATRIC:  Normal affect   ASSESSMENT:    1. Essential hypertension   2. PVC's (premature ventricular contractions)    PLAN:    In order of problems listed above:  1.  HTN - BP is well controlled on exam today.  She wlil continue on Toprol XL 50mg  daily and Lisinopril 10mg  daily.   2.  PVC's - these are fairly well controlled on BB

## 2017-02-15 ENCOUNTER — Encounter: Payer: Self-pay | Admitting: Cardiology

## 2017-02-15 ENCOUNTER — Ambulatory Visit (INDEPENDENT_AMBULATORY_CARE_PROVIDER_SITE_OTHER): Payer: Medicare Other | Admitting: Cardiology

## 2017-02-15 VITALS — BP 110/70 | Ht 65.0 in | Wt 139.4 lb

## 2017-02-15 DIAGNOSIS — I493 Ventricular premature depolarization: Secondary | ICD-10-CM | POA: Diagnosis not present

## 2017-02-15 NOTE — Patient Instructions (Signed)

## 2017-03-28 ENCOUNTER — Other Ambulatory Visit: Payer: Self-pay | Admitting: Cardiology

## 2017-03-29 ENCOUNTER — Other Ambulatory Visit: Payer: Self-pay | Admitting: Cardiology

## 2017-03-29 MED ORDER — LISINOPRIL 10 MG PO TABS: 10.0000 mg | ORAL_TABLET | Freq: Every day | ORAL | 2 refills | Status: DC

## 2017-04-03 ENCOUNTER — Telehealth: Payer: Self-pay | Admitting: Cardiology

## 2017-04-03 NOTE — Telephone Encounter (Signed)
Left a message for the pt to call back.  

## 2017-04-03 NOTE — Telephone Encounter (Signed)
Spoke with the pt and informed her that per Dr Radford Pax, she does not need to take ASA.  Advised the pt to stop taking.  Informed the pt that I will take this off of her med list.  Pt verbalized understanding and agrees with this plan.

## 2017-04-03 NOTE — Telephone Encounter (Signed)
Pt is calling with a non-urgent question for Dr Radford Pax.  Pt states that she was reading an article on taking  ASA 81 mg tab daily.  Pt states that the article she read was talking about pts who have no known CAD and taking ASA.  Pt states that the article mentioned that  pts who have no known CAD, or any other cardiac predispositions that would need ASA daily, should avoid taking this all together.  Pt just wanted Dr Theodosia Blender input on this.  Pt states she is good taking this or not.  Pt has had no complicatons from taking this daily. Informed the pt that Dr Radford Pax and her RN are out of the office, but I will route this message to them for further review and recommendation, upon return to the office.  Informed the pt that a Nurse will follow-up with her shortly thereafter, once recommendations are provided. Pt verbalized understanding and agrees with this plan.

## 2017-04-03 NOTE — Telephone Encounter (Signed)
New message      Pt c/o medication issue:  1. Name of [place]   2. How are you currently taking this medication (dosage and times per day)?  1 81mg   3. Are you having a reaction (difficulty breathing--STAT)? No    4. What is your medication issue? Patient has seen by report that it is not safe for her to take low dose aspirin it is dangerous for elderly .  Should she continue taking it

## 2017-04-03 NOTE — Telephone Encounter (Signed)
No need to take ASA

## 2017-04-04 MED ORDER — LISINOPRIL 10 MG PO TABS: 10.0000 mg | ORAL_TABLET | Freq: Every day | ORAL | 2 refills | Status: DC

## 2017-04-04 NOTE — Addendum Note (Signed)
Addended by: Juventino Slovak on: 04/04/2017 03:06 PM   Modules accepted: Orders

## 2017-06-06 ENCOUNTER — Other Ambulatory Visit: Payer: Self-pay | Admitting: Internal Medicine

## 2017-06-06 DIAGNOSIS — Z1231 Encounter for screening mammogram for malignant neoplasm of breast: Secondary | ICD-10-CM

## 2017-06-30 DIAGNOSIS — I10 Essential (primary) hypertension: Secondary | ICD-10-CM | POA: Diagnosis not present

## 2017-06-30 DIAGNOSIS — E559 Vitamin D deficiency, unspecified: Secondary | ICD-10-CM | POA: Diagnosis not present

## 2017-06-30 DIAGNOSIS — E7849 Other hyperlipidemia: Secondary | ICD-10-CM | POA: Diagnosis not present

## 2017-06-30 DIAGNOSIS — M859 Disorder of bone density and structure, unspecified: Secondary | ICD-10-CM | POA: Diagnosis not present

## 2017-06-30 DIAGNOSIS — R82998 Other abnormal findings in urine: Secondary | ICD-10-CM | POA: Diagnosis not present

## 2017-07-07 DIAGNOSIS — Z6823 Body mass index (BMI) 23.0-23.9, adult: Secondary | ICD-10-CM | POA: Diagnosis not present

## 2017-07-07 DIAGNOSIS — J069 Acute upper respiratory infection, unspecified: Secondary | ICD-10-CM | POA: Diagnosis not present

## 2017-07-07 DIAGNOSIS — Z1389 Encounter for screening for other disorder: Secondary | ICD-10-CM | POA: Diagnosis not present

## 2017-07-07 DIAGNOSIS — I10 Essential (primary) hypertension: Secondary | ICD-10-CM | POA: Diagnosis not present

## 2017-07-07 DIAGNOSIS — L270 Generalized skin eruption due to drugs and medicaments taken internally: Secondary | ICD-10-CM | POA: Diagnosis not present

## 2017-07-07 DIAGNOSIS — J45998 Other asthma: Secondary | ICD-10-CM | POA: Diagnosis not present

## 2017-07-07 DIAGNOSIS — J189 Pneumonia, unspecified organism: Secondary | ICD-10-CM | POA: Diagnosis not present

## 2017-07-07 DIAGNOSIS — C50919 Malignant neoplasm of unspecified site of unspecified female breast: Secondary | ICD-10-CM | POA: Diagnosis not present

## 2017-07-07 DIAGNOSIS — I5189 Other ill-defined heart diseases: Secondary | ICD-10-CM | POA: Diagnosis not present

## 2017-07-07 DIAGNOSIS — J479 Bronchiectasis, uncomplicated: Secondary | ICD-10-CM | POA: Diagnosis not present

## 2017-07-07 DIAGNOSIS — Z0000 Encounter for general adult medical examination without abnormal findings: Secondary | ICD-10-CM | POA: Diagnosis not present

## 2017-07-07 DIAGNOSIS — J449 Chronic obstructive pulmonary disease, unspecified: Secondary | ICD-10-CM | POA: Diagnosis not present

## 2017-07-31 DIAGNOSIS — Z1212 Encounter for screening for malignant neoplasm of rectum: Secondary | ICD-10-CM | POA: Diagnosis not present

## 2017-07-31 DIAGNOSIS — Z1211 Encounter for screening for malignant neoplasm of colon: Secondary | ICD-10-CM | POA: Diagnosis not present

## 2017-08-03 ENCOUNTER — Ambulatory Visit
Admission: RE | Admit: 2017-08-03 | Discharge: 2017-08-03 | Disposition: A | Discharge status: Home / Self Care | Payer: Medicare Other | Source: Ambulatory Visit | Attending: Internal Medicine | Admitting: Internal Medicine

## 2017-08-03 DIAGNOSIS — Z1231 Encounter for screening mammogram for malignant neoplasm of breast: Secondary | ICD-10-CM | POA: Diagnosis not present

## 2017-08-10 DIAGNOSIS — M25551 Pain in right hip: Secondary | ICD-10-CM | POA: Diagnosis not present

## 2017-08-10 DIAGNOSIS — M1611 Unilateral primary osteoarthritis, right hip: Secondary | ICD-10-CM | POA: Diagnosis not present

## 2017-08-10 DIAGNOSIS — Z471 Aftercare following joint replacement surgery: Secondary | ICD-10-CM | POA: Diagnosis not present

## 2017-08-10 DIAGNOSIS — S7001XA Contusion of right hip, initial encounter: Secondary | ICD-10-CM | POA: Diagnosis not present

## 2017-08-10 DIAGNOSIS — Z96641 Presence of right artificial hip joint: Secondary | ICD-10-CM | POA: Diagnosis not present

## 2017-08-22 ENCOUNTER — Other Ambulatory Visit: Payer: Self-pay | Admitting: Cardiology

## 2017-11-09 ENCOUNTER — Encounter (INDEPENDENT_AMBULATORY_CARE_PROVIDER_SITE_OTHER): Payer: Medicare Other | Admitting: Ophthalmology

## 2017-12-06 ENCOUNTER — Encounter (INDEPENDENT_AMBULATORY_CARE_PROVIDER_SITE_OTHER): Payer: Medicare Other | Admitting: Ophthalmology

## 2017-12-06 DIAGNOSIS — H43813 Vitreous degeneration, bilateral: Secondary | ICD-10-CM | POA: Diagnosis not present

## 2017-12-06 DIAGNOSIS — H353132 Nonexudative age-related macular degeneration, bilateral, intermediate dry stage: Secondary | ICD-10-CM | POA: Diagnosis not present

## 2017-12-06 DIAGNOSIS — H35033 Hypertensive retinopathy, bilateral: Secondary | ICD-10-CM | POA: Diagnosis not present

## 2017-12-06 DIAGNOSIS — I10 Essential (primary) hypertension: Secondary | ICD-10-CM | POA: Diagnosis not present

## 2017-12-18 DIAGNOSIS — H00016 Hordeolum externum left eye, unspecified eyelid: Secondary | ICD-10-CM | POA: Diagnosis not present

## 2018-01-31 ENCOUNTER — Other Ambulatory Visit: Payer: Self-pay | Admitting: Cardiology

## 2018-02-18 ENCOUNTER — Other Ambulatory Visit: Payer: Self-pay | Admitting: Cardiology

## 2018-03-16 ENCOUNTER — Encounter (INDEPENDENT_AMBULATORY_CARE_PROVIDER_SITE_OTHER): Payer: Self-pay

## 2018-03-16 ENCOUNTER — Ambulatory Visit (INDEPENDENT_AMBULATORY_CARE_PROVIDER_SITE_OTHER): Payer: Medicare Other | Admitting: Cardiology

## 2018-03-16 VITALS — BP 136/82 | HR 61 | Ht 65.0 in | Wt 143.0 lb

## 2018-03-16 DIAGNOSIS — I493 Ventricular premature depolarization: Secondary | ICD-10-CM | POA: Diagnosis not present

## 2018-03-16 DIAGNOSIS — I10 Essential (primary) hypertension: Secondary | ICD-10-CM | POA: Diagnosis not present

## 2018-03-16 MED ORDER — LISINOPRIL 10 MG PO TABS: 10.0000 mg | ORAL_TABLET | Freq: Every day | ORAL | 3 refills | Status: DC

## 2018-03-16 MED ORDER — METOPROLOL SUCCINATE ER 50 MG PO TB24
50.0000 mg | ORAL_TABLET | Freq: Every day | ORAL | 3 refills | Status: DC
Start: 2018-03-16 — End: 2019-01-07

## 2018-03-16 NOTE — Progress Notes (Signed)
Cardiology Office Note:    Date:  03/16/2018   ID:  [name] [name], DOB Aug 16, 1930, MRN [id number]  PCP:  Crist Infante, MD  Cardiologist:  Fransico Him, MD    Referring MD: Crist Infante, MD   Chief Complaint  Patient presents with  . Follow-up    Hypertension and PVCs    History of Present Illness:    [name] [name] is a 85 y.o. female with a hx of PVC's and HTN.  She is here today for followup and is doing well.  She denies any chest pain or pressure, SOB, DOE, PND, orthopnea, LE edema, dizziness, palpitations or syncope. She is compliant with her meds and is tolerating meds with no SE.    Past Medical History:  Diagnosis Date  . Anxiety   . Bradycardia    asymptomatic  . Breast CA ([place]) 1993 OR 1994   LEFT, SURGERY AND RADIATION DONE  . Breast cancer ([place])   . COPD (chronic obstructive pulmonary disease) (HCC)    MILD, NO INHALERS USED  . DEGENERATIVE JOINT DISEASE, KNEE   . Family history of anesthesia complication    NEPHEW NAUSEA/VOMITING  . GENU VALGUM   . Hyperlipidemia   . Hypertension   . Numbness of leg 2011   just left shin  . 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 Pneumonia 2013   X 2  . PVC's (premature ventricular contractions)   . ROTATOR CUFF SYNDROME, LEFT    CANNOT LIFT LEFT ARM ALL THE WAY UP  . UNEQUAL LEG LENGTH   . UTI (urinary tract infection)    STARTED AUGMENTIN  ON 09-10-13    Past Surgical History:  Procedure Laterality Date  . APPENDECTOMY  AGE 47  . BREAST LUMPECTOMY Left 1991   radiation  . BREAST SURGERY Left 1993 OR 1994   LUMPECTOMY AND RADIATION DONE  . JOINT REPLACEMENT    . NASAL SINUS SURGERY  2006  . REVERSE SHOULDER ARTHROPLASTY Left 07/31/2015   Procedure: LEFT REVERSE SHOULDER ARTHROPLASTY;  Surgeon: Netta Cedars, MD;  Location: [facility];  Service: Orthopedics;  Laterality: Left;  . TOTAL HIP ARTHROPLASTY Bilateral LEFT 2008 AND RIGHT 2010  . TOTAL KNEE ARTHROPLASTY Left 09/17/2013   Procedure: LEFT TOTAL  KNEE ARTHROPLASTY;  Surgeon: Mauri Pole, MD;  Location: [facility];  Service: Orthopedics;  Laterality: Left;    Current Medications: Current Meds  Medication Sig  . Albuterol Sulfate (PROAIR RESPICLICK) 478 (90 Base) MCG/ACT AEPB Inhale 1 puff into the lungs every 6 (six) hours as needed (wheezing).  [name] amoxicillin (AMOXIL) 500 MG capsule Take 2,000 mg by mouth See admin instructions. Pt takes prior to dental appointments  . atorvastatin (LIPITOR) 20 MG tablet Take 20 mg by mouth every evening.   . benzonatate (TESSALON) 100 MG capsule TK 1 TO 2 CS PO Q 8 H PRF COUGH  . BIOTIN PO Take 1 tablet by mouth daily as needed (supplement).  . diphenoxylate-atropine (LOMOTIL) 2.5-0.025 MG per tablet Take 1 tablet by mouth 4 (four) times daily as needed for diarrhea or loose stools.  [name] doxycycline (VIBRA-TABS) 100 MG tablet TK 1 T PO  BID  . escitalopram (LEXAPRO) 10 MG tablet Take 10 mg by mouth every morning.  . eszopiclone (LUNESTA) 2 MG TABS tablet Take 1 mg by mouth at bedtime as needed for sleep.   [name] Fluticasone-Salmeterol (ADVAIR DISKUS) 250-50 MCG/DOSE AEPB Advair Diskus 250 mcg-50 mcg/dose powder for inhalation  . lisinopril (  PRINIVIL,ZESTRIL) 10 MG tablet Take 1 tablet (10 mg total) by mouth daily.  [name] LORazepam (ATIVAN) 0.5 MG tablet Take 0.25 mg by mouth every 8 (eight) hours as needed for anxiety.  [name] Propylene Glycol-Glycerin (SOOTHE) 0.6-0.6 % SOLN Apply to eye.  [name] Respiratory Therapy Supplies (FLUTTER) DEVI Use as directed  . TOPROL XL 50 MG 24 hr tablet TAKE 1 TABLET DAILY. TAKE WITH OR IMMEDIATELY FOLLOWING A MEAL.  [name] Vitamin D, Ergocalciferol, (DRISDOL) 50000 UNITS CAPS capsule Take 50,000 Units by mouth 2 (two) times a week. Mondays   . [DISCONTINUED] Probiotic Product (ALIGN PO) Take 1 tablet by mouth daily.     Allergies:   Other and Cardizem [diltiazem hcl]   Social History   Socioeconomic History  . Marital status: Widowed    Spouse name: Not on file  . Number of children:  Not on file  . Years of education: Not on file  . Highest education level: Not on file  Occupational History  . Not on file  Social Needs  . Financial resource strain: Not on file  . Food insecurity:    Worry: Not on file    Inability: Not on file  . Transportation needs:    Medical: Not on file    Non-medical: Not on file  Tobacco Use  . Smoking status: Former Smoker    Packs/day: 0.25    Years: 15.00    Pack years: 3.75    Types: Cigarettes    Last attempt to quit: 01/18/1992    Years since quitting: 26.1  . Smokeless tobacco: Never Used  Substance and Sexual Activity  . Alcohol use: Yes    Comment: WINE 2 GLASSES PER DAY  . Drug use: No  . Sexual activity: Not on file  Lifestyle  . Physical activity:    Days per week: Not on file    Minutes per session: Not on file  . Stress: Not on file  Relationships  . Social connections:    Talks on phone: Not on file    Gets together: Not on file    Attends religious service: Not on file    Active member of club or organization: Not on file    Attends meetings of clubs or organizations: Not on file    Relationship status: Not on file  Other Topics Concern  . Not on file  Social History Narrative  . Not on file     Family History: The patient's family history includes Diabetes in her mother; Heart Problems in her father; Hypertension in her brother.  ROS:   Please see the history of present illness.    ROS  All other systems reviewed and negative.   EKGs/Labs/Other Studies Reviewed:    The following studies were reviewed today: none  EKG:  EKG is  ordered today.  The ekg ordered today demonstrates normal sinus rhythm at 61 bpm with nonspecific T wave abnormality.  Recent Labs: No results found for requested labs within last 8760 hours.   Recent Lipid Panel No results found for: CHOL, TRIG, HDL, CHOLHDL, VLDL, LDLCALC, LDLDIRECT  Physical Exam:    VS:  BP 136/82   Pulse 61   Ht '5\' 5"'  (1.651 m)   Wt 143 lb  (64.9 kg)   BMI 23.80 kg/m     Wt Readings from Last 3 Encounters:  03/16/18 143 lb (64.9 kg)  02/15/17 139 lb 6.4 oz (63.2 kg)  01/31/17 141 lb (64 kg)     GEN:  Well nourished, well developed in no acute distress HEENT: Normal NECK: No JVD; No carotid bruits LYMPHATICS: No lymphadenopathy CARDIAC: RRR, no murmurs, rubs, gallops RESPIRATORY:  Clear to auscultation without rales, wheezing or rhonchi  ABDOMEN: Soft, non-tender, non-distended MUSCULOSKELETAL:  No edema; No deformity  SKIN: Warm and dry NEUROLOGIC:  Alert and oriented x 3 PSYCHIATRIC:  Normal affect   ASSESSMENT:    1. PVC's (premature ventricular contractions)   2. Essential hypertension    PLAN:    In order of problems listed above:  1.  PVCs -these have been well controlled on beta-blocker therapy.  She will continue on Toprol-XL 50 mg daily.  2.  HTN - BP is well controlled on exam today.  She will continue on Toprol-XL 50 mg daily and lisinopril 10 mg daily.  Her creatinine was 0.8 a year ago.  I will repeat a be met today.   Medication Adjustments/Labs and Tests Ordered: Current medicines are reviewed at length with the patient today.  Concerns regarding medicines are outlined above.  Orders Placed This Encounter  Procedures  . EKG 12-Lead   No orders of the defined types were placed in this encounter.   Signed, Fransico Him, MD  03/16/2018 8:47 AM    [place]

## 2018-03-16 NOTE — Patient Instructions (Signed)
Medication Instructions:  Your physician recommends that you continue on your current medications as directed. Please refer to the Current Medication list given to you today.  If you need a refill on your cardiac medications before your next appointment, please call your pharmacy.   Lab work: None If you have labs (blood work) drawn today and your tests are completely normal, you will receive your results only by: . MyChart Message (if you have MyChart) OR . A paper copy in the mail If you have any lab test that is abnormal or we need to change your treatment, we will call you to review the results.  Testing/Procedures: None  Follow-Up: At [facility], you and your health needs are our priority.  As part of our continuing mission to provide you with exceptional heart care, we have created designated Provider Care Teams.  These Care Teams include your primary Cardiologist (physician) and Advanced Practice Providers (APPs -  Physician Assistants and Nurse Practitioners) who all work together to provide you with the care you need, when you need it. You will need a follow up appointment in 1 years.  Please call our office 2 months in advance to schedule this appointment.  You may see Traci Turner, MD or one of the following Advanced Practice Providers on your designated Care Team:   Brittainy Simmons, PA-C Dayna Dunn, PA-C . Michele Lenze, PA-C   

## 2018-06-25 ENCOUNTER — Other Ambulatory Visit: Payer: Self-pay | Admitting: Internal Medicine

## 2018-06-25 DIAGNOSIS — Z1231 Encounter for screening mammogram for malignant neoplasm of breast: Secondary | ICD-10-CM

## 2018-07-10 ENCOUNTER — Telehealth: Payer: Self-pay | Admitting: Cardiology

## 2018-07-10 NOTE — Telephone Encounter (Signed)
Patient called this morning with what she felt was a silly question.  She states that she can not remember if she took her blood pressure medication this morning. She wanted to know if it was safe to take another dose, or if she should just wait.

## 2018-07-10 NOTE — Telephone Encounter (Signed)
Spoke with pt. She cannot remember if she took her morning BP medication this morning. She is afraid if she takes an additional dose, her BP will drop too low.  I advised her to monitor her BP a couple of times today. If her SBP > 135 HR > 60 she should go ahead and take her medication. She states she will measure now and early afternoon. I advised her to call us back with any questions.

## 2018-08-09 ENCOUNTER — Ambulatory Visit
Admission: RE | Admit: 2018-08-09 | Discharge: 2018-08-09 | Disposition: A | Discharge status: Home / Self Care | Payer: Medicare Other | Source: Ambulatory Visit | Attending: Internal Medicine | Admitting: Internal Medicine

## 2018-08-09 ENCOUNTER — Other Ambulatory Visit: Payer: Self-pay

## 2018-08-09 DIAGNOSIS — Z1231 Encounter for screening mammogram for malignant neoplasm of breast: Secondary | ICD-10-CM

## 2018-08-13 ENCOUNTER — Other Ambulatory Visit: Payer: Self-pay | Admitting: Internal Medicine

## 2018-08-13 DIAGNOSIS — R928 Other abnormal and inconclusive findings on diagnostic imaging of breast: Secondary | ICD-10-CM

## 2018-08-16 ENCOUNTER — Other Ambulatory Visit: Payer: Self-pay

## 2018-08-16 ENCOUNTER — Ambulatory Visit
Admission: RE | Admit: 2018-08-16 | Discharge: 2018-08-16 | Disposition: A | Discharge status: Home / Self Care | Payer: Medicare Other | Source: Ambulatory Visit | Attending: Internal Medicine | Admitting: Internal Medicine

## 2018-08-16 ENCOUNTER — Other Ambulatory Visit: Payer: Self-pay | Admitting: Internal Medicine

## 2018-08-16 DIAGNOSIS — R928 Other abnormal and inconclusive findings on diagnostic imaging of breast: Secondary | ICD-10-CM

## 2018-08-16 DIAGNOSIS — Z853 Personal history of malignant neoplasm of breast: Secondary | ICD-10-CM | POA: Diagnosis not present

## 2018-08-16 DIAGNOSIS — E7849 Other hyperlipidemia: Secondary | ICD-10-CM | POA: Diagnosis not present

## 2018-08-16 DIAGNOSIS — E559 Vitamin D deficiency, unspecified: Secondary | ICD-10-CM | POA: Diagnosis not present

## 2018-08-16 DIAGNOSIS — N6489 Other specified disorders of breast: Secondary | ICD-10-CM | POA: Diagnosis not present

## 2018-08-16 DIAGNOSIS — R922 Inconclusive mammogram: Secondary | ICD-10-CM | POA: Diagnosis not present

## 2018-08-16 DIAGNOSIS — N6320 Unspecified lump in the left breast, unspecified quadrant: Secondary | ICD-10-CM

## 2018-08-20 DIAGNOSIS — I10 Essential (primary) hypertension: Secondary | ICD-10-CM | POA: Diagnosis not present

## 2018-08-20 DIAGNOSIS — R82998 Other abnormal findings in urine: Secondary | ICD-10-CM | POA: Diagnosis not present

## 2018-08-23 DIAGNOSIS — M8580 Other specified disorders of bone density and structure, unspecified site: Secondary | ICD-10-CM | POA: Diagnosis not present

## 2018-08-23 DIAGNOSIS — H9190 Unspecified hearing loss, unspecified ear: Secondary | ICD-10-CM | POA: Diagnosis not present

## 2018-08-23 DIAGNOSIS — I10 Essential (primary) hypertension: Secondary | ICD-10-CM | POA: Diagnosis not present

## 2018-08-23 DIAGNOSIS — J479 Bronchiectasis, uncomplicated: Secondary | ICD-10-CM | POA: Diagnosis not present

## 2018-08-23 DIAGNOSIS — Z0000 Encounter for general adult medical examination without abnormal findings: Secondary | ICD-10-CM | POA: Diagnosis not present

## 2018-08-23 DIAGNOSIS — I5189 Other ill-defined heart diseases: Secondary | ICD-10-CM | POA: Diagnosis not present

## 2018-08-23 DIAGNOSIS — N390 Urinary tract infection, site not specified: Secondary | ICD-10-CM | POA: Diagnosis not present

## 2018-08-23 DIAGNOSIS — J449 Chronic obstructive pulmonary disease, unspecified: Secondary | ICD-10-CM | POA: Diagnosis not present

## 2018-08-23 DIAGNOSIS — J45909 Unspecified asthma, uncomplicated: Secondary | ICD-10-CM | POA: Diagnosis not present

## 2018-08-23 DIAGNOSIS — D649 Anemia, unspecified: Secondary | ICD-10-CM | POA: Diagnosis not present

## 2018-08-23 DIAGNOSIS — E559 Vitamin D deficiency, unspecified: Secondary | ICD-10-CM | POA: Diagnosis not present

## 2018-08-23 DIAGNOSIS — C50919 Malignant neoplasm of unspecified site of unspecified female breast: Secondary | ICD-10-CM | POA: Diagnosis not present

## 2018-08-23 DIAGNOSIS — Z1331 Encounter for screening for depression: Secondary | ICD-10-CM | POA: Diagnosis not present

## 2018-10-05 DIAGNOSIS — Z23 Encounter for immunization: Secondary | ICD-10-CM | POA: Diagnosis not present

## 2018-10-25 ENCOUNTER — Telehealth: Payer: Self-pay | Admitting: Cardiology

## 2018-10-25 MED ORDER — LISINOPRIL 10 MG PO TABS: 10.0000 mg | ORAL_TABLET | Freq: Every day | ORAL | 3 refills | Status: DC

## 2018-10-25 NOTE — Telephone Encounter (Signed)
**Note De-Identified  Obfuscation** Lisinopril 10 mg #90 with 3 refills sent to [facility] to fill as requested. The pt is aware.

## 2018-10-25 NOTE — Telephone Encounter (Signed)
° ° ° ° °*  STAT* If patient is at the pharmacy, call can be transferred to refill team.   1. Which medications need to be refilled? (please list name of each medication and dose if known) lisinopril (PRINIVIL,ZESTRIL) 10 MG tablet  2. Which pharmacy/location (including street and city if local pharmacy) is medication to be sent to? [facility] [place], [place]  3. Do they need a 30 day or 90 day supply? [place]

## 2018-11-30 DIAGNOSIS — K045 Chronic apical periodontitis: Secondary | ICD-10-CM | POA: Diagnosis not present

## 2018-12-10 ENCOUNTER — Encounter (INDEPENDENT_AMBULATORY_CARE_PROVIDER_SITE_OTHER): Payer: Medicare Other | Admitting: Ophthalmology

## 2018-12-18 ENCOUNTER — Other Ambulatory Visit: Payer: Self-pay

## 2018-12-18 DIAGNOSIS — Z20822 Contact with and (suspected) exposure to covid-19: Secondary | ICD-10-CM

## 2018-12-20 LAB — NOVEL CORONAVIRUS, NAA: SARS-CoV-2, NAA: NOT DETECTED

## 2018-12-27 DIAGNOSIS — R197 Diarrhea, unspecified: Secondary | ICD-10-CM | POA: Diagnosis not present

## 2018-12-27 DIAGNOSIS — K5730 Diverticulosis of large intestine without perforation or abscess without bleeding: Secondary | ICD-10-CM | POA: Insufficient documentation

## 2018-12-28 DIAGNOSIS — R197 Diarrhea, unspecified: Secondary | ICD-10-CM | POA: Diagnosis not present

## 2019-01-07 ENCOUNTER — Telehealth: Payer: Self-pay | Admitting: Cardiology

## 2019-01-07 MED ORDER — METOPROLOL SUCCINATE ER 50 MG PO TB24: 50.0000 mg | ORAL_TABLET | Freq: Every day | ORAL | 0 refills | Status: DC

## 2019-01-07 NOTE — Telephone Encounter (Signed)
Spoke with patient, she states she does not need refill of Lisinopril, she needs an order for Metoprolol to be sent to [facility] since [facility] is out of stock.

## 2019-01-07 NOTE — Telephone Encounter (Signed)
*  STAT* If patient is at the pharmacy, call can be transferred to refill team.   1. Which medications need to be refilled? (please list name of each medication and dose if known)   lisinopril (ZESTRIL) 10 MG tablet   2. Which pharmacy/location (including street and city if local pharmacy) is medication to be sent to? [facility] [place], [place]  3. Do they need a 30 day or 90 day supply? 90 day

## 2019-01-07 NOTE — Telephone Encounter (Signed)
Follow up   Patient states that she gave wrong prescription to be refilled originally:    *STAT* If patient is at the pharmacy, call can be transferred to refill team.   1. Which medications need to be refilled? (please list name of each medication and dose if known) metoprolol succinate (TOPROL XL) 50 MG 24 hr tablet  2. Which pharmacy/location (including street and city if local pharmacy) is medication to be sent to? [facility], [place] - [address] AT [place]  3. Do they need a 30 day or 90 day supply? [place]

## 2019-01-21 DIAGNOSIS — R197 Diarrhea, unspecified: Secondary | ICD-10-CM | POA: Diagnosis not present

## 2019-01-29 ENCOUNTER — Encounter (INDEPENDENT_AMBULATORY_CARE_PROVIDER_SITE_OTHER): Payer: Medicare HMO | Admitting: Ophthalmology

## 2019-01-29 DIAGNOSIS — H35033 Hypertensive retinopathy, bilateral: Secondary | ICD-10-CM

## 2019-01-29 DIAGNOSIS — H353132 Nonexudative age-related macular degeneration, bilateral, intermediate dry stage: Secondary | ICD-10-CM | POA: Diagnosis not present

## 2019-01-29 DIAGNOSIS — H43813 Vitreous degeneration, bilateral: Secondary | ICD-10-CM

## 2019-01-29 DIAGNOSIS — I10 Essential (primary) hypertension: Secondary | ICD-10-CM | POA: Diagnosis not present

## 2019-02-07 ENCOUNTER — Ambulatory Visit: Payer: Medicare HMO | Attending: Internal Medicine

## 2019-02-07 DIAGNOSIS — Z20822 Contact with and (suspected) exposure to covid-19: Secondary | ICD-10-CM

## 2019-02-08 LAB — NOVEL CORONAVIRUS, NAA: SARS-CoV-2, NAA: NOT DETECTED

## 2019-02-19 ENCOUNTER — Ambulatory Visit: Payer: Medicare Other

## 2019-02-19 ENCOUNTER — Ambulatory Visit
Admission: RE | Admit: 2019-02-19 | Discharge: 2019-02-19 | Disposition: A | Discharge status: Home / Self Care | Payer: Medicare Other | Source: Ambulatory Visit | Attending: Internal Medicine | Admitting: Internal Medicine

## 2019-02-19 ENCOUNTER — Other Ambulatory Visit: Payer: Self-pay

## 2019-02-19 DIAGNOSIS — R922 Inconclusive mammogram: Secondary | ICD-10-CM | POA: Diagnosis not present

## 2019-02-19 DIAGNOSIS — N6320 Unspecified lump in the left breast, unspecified quadrant: Secondary | ICD-10-CM

## 2019-03-04 NOTE — Progress Notes (Addendum)
Cardiology Office Note:    Date:  03/05/2019   ID:  [name] [name], DOB 1930-07-04, MRN [id number]  PCP:  [name] Infante, MD  Cardiologist:  [name] Him, MD    Referring MD: [name] Infante, MD   Chief Complaint  Patient presents with  . Follow-up    PVCs and HTN    History of Present Illness:    [name] [name] is a 86 y.o. female with a hx of PVC's and HTN.  She is here today for followup and is doing well.  She denies any chest pain or pressure, SOB, DOE, PND, orthopnea, LE edema, dizziness, palpitations or syncope. She is compliant with her meds and is tolerating meds with no SE.    Past Medical History:  Diagnosis Date  . Anxiety   . Bradycardia    asymptomatic  . Breast CA ([place]) 1993 OR 1994   LEFT, SURGERY AND RADIATION DONE  . Breast cancer ([place])   . COPD (chronic obstructive pulmonary disease) (HCC)    MILD, NO INHALERS USED  . DEGENERATIVE JOINT DISEASE, KNEE   . Family history of anesthesia complication    NEPHEW NAUSEA/VOMITING  . GENU VALGUM   . Hyperlipidemia   . Hypertension   . Numbness of leg 2011   just left shin  . 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 Pneumonia 2013   X 2  . PVC's (premature ventricular contractions)   . ROTATOR CUFF SYNDROME, LEFT    CANNOT LIFT LEFT ARM ALL THE WAY UP  . UNEQUAL LEG LENGTH   . UTI (urinary tract infection)    STARTED AUGMENTIN  ON 09-10-13    Past Surgical History:  Procedure Laterality Date  . APPENDECTOMY  AGE 35  . BREAST LUMPECTOMY Left 1991   radiation  . BREAST SURGERY Left 1993 OR 1994   LUMPECTOMY AND RADIATION DONE  . JOINT REPLACEMENT    . NASAL SINUS SURGERY  2006  . REVERSE SHOULDER ARTHROPLASTY Left 07/31/2015   Procedure: LEFT REVERSE SHOULDER ARTHROPLASTY;  Surgeon: Netta Cedars, MD;  Location: [facility];  Service: Orthopedics;  Laterality: Left;  . TOTAL HIP ARTHROPLASTY Bilateral LEFT 2008 AND RIGHT 2010  . TOTAL KNEE ARTHROPLASTY Left 09/17/2013   Procedure: LEFT TOTAL KNEE  ARTHROPLASTY;  Surgeon: Mauri Pole, MD;  Location: [facility];  Service: Orthopedics;  Laterality: Left;    Current Medications: Current Meds  Medication Sig  . Albuterol Sulfate (PROAIR RESPICLICK) 123XX123 (90 Base) MCG/ACT AEPB Inhale 1 puff into the lungs every 6 (six) hours as needed (wheezing).  [name] amoxicillin (AMOXIL) 500 MG capsule Take 2,000 mg by mouth See admin instructions. Pt takes prior to dental appointments  . atorvastatin (LIPITOR) 20 MG tablet Take 20 mg by mouth every evening.   . benzonatate (TESSALON) 100 MG capsule TK 1 TO 2 CS PO Q 8 H PRF COUGH  . BIOTIN PO Take 1 tablet by mouth daily as needed (supplement).  . diphenoxylate-atropine (LOMOTIL) 2.5-0.025 MG per tablet Take 1 tablet by mouth 4 (four) times daily as needed for diarrhea or loose stools.  [name] escitalopram (LEXAPRO) 10 MG tablet Take 10 mg by mouth every morning.  . eszopiclone (LUNESTA) 2 MG TABS tablet Take 1 mg by mouth at bedtime as needed for sleep.   [name] Fluticasone-Salmeterol (ADVAIR DISKUS) 250-50 MCG/DOSE AEPB Advair Diskus 250 mcg-50 mcg/dose powder for inhalation  . lisinopril (ZESTRIL) 10 MG tablet Take 1 tablet (10 mg total) by mouth daily.  [name]  LORazepam (ATIVAN) 0.5 MG tablet Take 0.25 mg by mouth every 8 (eight) hours as needed for anxiety.  . metoprolol succinate (TOPROL XL) 50 MG 24 hr tablet Take 1 tablet (50 mg total) by mouth daily. Take with or immediately following a meal.  . Propylene Glycol-Glycerin (SOOTHE) 0.6-0.6 % SOLN Apply to eye.  [name] Respiratory Therapy Supplies (FLUTTER) DEVI Use as directed  . Vitamin D, Ergocalciferol, (DRISDOL) 50000 UNITS CAPS capsule Take 50,000 Units by mouth 2 (two) times a week. Mondays      Allergies:   Other and Cardizem [diltiazem hcl]   Social History   Socioeconomic History  . Marital status: Widowed    Spouse name: Not on file  . Number of children: Not on file  . Years of education: Not on file  . Highest education level: Not on file  Occupational  History  . Not on file  Tobacco Use  . Smoking status: Former Smoker    Packs/day: 0.25    Years: 15.00    Pack years: 3.75    Types: Cigarettes    Quit date: 01/18/1992    Years since quitting: 27.1  . Smokeless tobacco: Never Used  Substance and Sexual Activity  . Alcohol use: Yes    Comment: WINE 2 GLASSES PER DAY  . Drug use: No  . Sexual activity: Not on file  Other Topics Concern  . Not on file  Social History Narrative  . Not on file   Social Determinants of Health   Financial Resource Strain:   . Difficulty of Paying Living Expenses: Not on file  Food Insecurity:   . Worried About Charity fundraiser in the Last Year: Not on file  . Ran Out of Food in the Last Year: Not on file  Transportation Needs:   . Lack of Transportation (Medical): Not on file  . Lack of Transportation (Non-Medical): Not on file  Physical Activity:   . Days of Exercise per Week: Not on file  . Minutes of Exercise per Session: Not on file  Stress:   . Feeling of Stress : Not on file  Social Connections:   . Frequency of Communication with Friends and Family: Not on file  . Frequency of Social Gatherings with Friends and Family: Not on file  . Attends Religious Services: Not on file  . Active Member of Clubs or Organizations: Not on file  . Attends Archivist Meetings: Not on file  . Marital Status: Not on file     Family History: The patient's family history includes Diabetes in her mother; Heart Problems in her father; Hypertension in her brother.  ROS:   Please see the history of present illness.    ROS  All other systems reviewed and negative.   EKGs/Labs/Other Studies Reviewed:    The following studies were reviewed today: Outside labs from [facility]  EKG:  EKG is  ordered today.  The ekg ordered today demonstrates NSR with nonspecific t wave abnormality  Recent Labs: No results found for requested labs within last 8760 hours.   Recent Lipid Panel No results found  for: CHOL, TRIG, HDL, CHOLHDL, VLDL, LDLCALC, LDLDIRECT  Physical Exam:    VS:  BP 128/78   Pulse 60   Ht 5\' 5"  (1.651 m)   Wt 146 lb 6.4 oz (66.4 kg)   BMI 24.36 kg/m     Wt Readings from Last 3 Encounters:  03/05/19 146 lb 6.4 oz (66.4 kg)  03/16/18 143 lb (  64.9 kg)  02/15/17 139 lb 6.4 oz (63.2 kg)     GEN:  Well nourished, well developed in no acute distress HEENT: Normal NECK: No JVD; No carotid bruits LYMPHATICS: No lymphadenopathy CARDIAC: RRR, no murmurs, rubs, gallops RESPIRATORY:  Clear to auscultation without rales, wheezing or rhonchi  ABDOMEN: Soft, non-tender, non-distended MUSCULOSKELETAL:  No edema; No deformity  SKIN: Warm and dry NEUROLOGIC:  Alert and oriented x 3 PSYCHIATRIC:  Normal affect   ASSESSMENT:    1. PVC's (premature ventricular contractions)   2. Essential hypertension   3. Pure hypercholesterolemia    PLAN:    In order of problems listed above:  1.  PVCs -PVCs well suppressed on BB -continue Toprol XL 50mg  daily  2.  HTN -BP controlled on exam -continue Toprol XL 50mg  daily and Lisinopril 10mg  daily -outside labs reviewed and showed a creatinine of 0.8 in Dec 2020  3.  HLD -followed by PCP -LDL was 77 in July on outside labs from PCP on [facility] -contninue Atorvastatin 20mg  daiyl    Medication Adjustments/Labs and Tests Ordered: Current medicines are reviewed at length with the patient today.  Concerns regarding medicines are outlined above.  Orders Placed This Encounter  Procedures  . EKG 12-Lead   No orders of the defined types were placed in this encounter.   Signed, [name] Him, MD  03/05/2019 11:30 AM    [place]

## 2019-03-05 ENCOUNTER — Encounter: Payer: Self-pay | Admitting: Cardiology

## 2019-03-05 ENCOUNTER — Ambulatory Visit (INDEPENDENT_AMBULATORY_CARE_PROVIDER_SITE_OTHER): Payer: Medicare HMO | Admitting: Cardiology

## 2019-03-05 ENCOUNTER — Other Ambulatory Visit: Payer: Self-pay

## 2019-03-05 VITALS — BP 128/78 | HR 60 | Ht 65.0 in | Wt 146.4 lb

## 2019-03-05 DIAGNOSIS — I493 Ventricular premature depolarization: Secondary | ICD-10-CM

## 2019-03-05 DIAGNOSIS — E7800 Pure hypercholesterolemia, unspecified: Secondary | ICD-10-CM | POA: Diagnosis not present

## 2019-03-05 DIAGNOSIS — I10 Essential (primary) hypertension: Secondary | ICD-10-CM | POA: Diagnosis not present

## 2019-03-05 NOTE — Patient Instructions (Signed)

## 2019-03-12 DIAGNOSIS — R69 Illness, unspecified: Secondary | ICD-10-CM | POA: Diagnosis not present

## 2019-03-18 DIAGNOSIS — R1012 Left upper quadrant pain: Secondary | ICD-10-CM | POA: Diagnosis not present

## 2019-03-18 DIAGNOSIS — R197 Diarrhea, unspecified: Secondary | ICD-10-CM | POA: Diagnosis not present

## 2019-03-19 ENCOUNTER — Other Ambulatory Visit: Payer: Self-pay | Admitting: Physician Assistant

## 2019-03-19 DIAGNOSIS — R1012 Left upper quadrant pain: Secondary | ICD-10-CM

## 2019-03-19 DIAGNOSIS — R197 Diarrhea, unspecified: Secondary | ICD-10-CM | POA: Diagnosis not present

## 2019-04-08 ENCOUNTER — Ambulatory Visit: Payer: Medicare HMO

## 2019-04-10 ENCOUNTER — Ambulatory Visit
Admission: RE | Admit: 2019-04-10 | Discharge: 2019-04-10 | Disposition: A | Discharge status: Home / Self Care | Payer: Medicare HMO | Source: Ambulatory Visit | Attending: Physician Assistant | Admitting: Physician Assistant

## 2019-04-10 DIAGNOSIS — R1012 Left upper quadrant pain: Secondary | ICD-10-CM | POA: Diagnosis not present

## 2019-04-12 DIAGNOSIS — K591 Functional diarrhea: Secondary | ICD-10-CM | POA: Diagnosis not present

## 2019-04-16 DIAGNOSIS — R197 Diarrhea, unspecified: Secondary | ICD-10-CM | POA: Diagnosis not present

## 2019-04-16 DIAGNOSIS — Z96649 Presence of unspecified artificial hip joint: Secondary | ICD-10-CM | POA: Diagnosis not present

## 2019-04-16 DIAGNOSIS — R69 Illness, unspecified: Secondary | ICD-10-CM | POA: Diagnosis not present

## 2019-04-16 DIAGNOSIS — Z87891 Personal history of nicotine dependence: Secondary | ICD-10-CM | POA: Diagnosis not present

## 2019-04-16 DIAGNOSIS — E785 Hyperlipidemia, unspecified: Secondary | ICD-10-CM | POA: Diagnosis not present

## 2019-04-16 DIAGNOSIS — Z853 Personal history of malignant neoplasm of breast: Secondary | ICD-10-CM | POA: Diagnosis not present

## 2019-04-16 DIAGNOSIS — I10 Essential (primary) hypertension: Secondary | ICD-10-CM | POA: Diagnosis not present

## 2019-04-16 DIAGNOSIS — G4700 Insomnia, unspecified: Secondary | ICD-10-CM | POA: Diagnosis not present

## 2019-04-18 DIAGNOSIS — R58 Hemorrhage, not elsewhere classified: Secondary | ICD-10-CM | POA: Diagnosis not present

## 2019-04-18 DIAGNOSIS — W1830XA Fall on same level, unspecified, initial encounter: Secondary | ICD-10-CM | POA: Diagnosis not present

## 2019-04-18 DIAGNOSIS — M25532 Pain in left wrist: Secondary | ICD-10-CM | POA: Diagnosis not present

## 2019-07-29 ENCOUNTER — Other Ambulatory Visit: Payer: Self-pay | Admitting: Internal Medicine

## 2019-07-29 ENCOUNTER — Other Ambulatory Visit: Payer: Self-pay | Admitting: Cardiology

## 2019-07-29 DIAGNOSIS — Z9889 Other specified postprocedural states: Secondary | ICD-10-CM

## 2019-07-29 DIAGNOSIS — N6489 Other specified disorders of breast: Secondary | ICD-10-CM

## 2019-07-29 NOTE — Telephone Encounter (Signed)
°*  STAT* If patient is at the pharmacy, call can be transferred to refill team.   1. Which medications need to be refilled? (please list name of each medication and dose if known)? metoprolol succinate (TOPROL XL) 50 MG 24 hr tablet  2. Which pharmacy/location (including street and city if local pharmacy) is medication to be sent to? [facility] [place], [place]  3. Do they need a 30 day or 90 day supply? 90 day

## 2019-07-30 MED ORDER — METOPROLOL SUCCINATE ER 50 MG PO TB24: 50.0000 mg | ORAL_TABLET | Freq: Every day | ORAL | 2 refills | Status: DC

## 2019-07-30 NOTE — Telephone Encounter (Signed)
Pt's medication was sent to pt's pharmacy as requested. Confirmation received.  °

## 2019-08-06 ENCOUNTER — Other Ambulatory Visit: Payer: Self-pay | Admitting: Cardiology

## 2019-08-13 ENCOUNTER — Ambulatory Visit
Admission: RE | Admit: 2019-08-13 | Discharge: 2019-08-13 | Disposition: A | Discharge status: Home / Self Care | Payer: Medicare HMO | Source: Ambulatory Visit | Attending: Internal Medicine | Admitting: Internal Medicine

## 2019-08-13 ENCOUNTER — Other Ambulatory Visit: Payer: Self-pay

## 2019-08-13 DIAGNOSIS — N6489 Other specified disorders of breast: Secondary | ICD-10-CM

## 2019-08-13 DIAGNOSIS — R922 Inconclusive mammogram: Secondary | ICD-10-CM | POA: Diagnosis not present

## 2019-08-13 DIAGNOSIS — Z9889 Other specified postprocedural states: Secondary | ICD-10-CM

## 2019-09-05 DIAGNOSIS — Z20822 Contact with and (suspected) exposure to covid-19: Secondary | ICD-10-CM | POA: Diagnosis not present

## 2019-09-13 ENCOUNTER — Other Ambulatory Visit: Payer: Self-pay | Admitting: Cardiology

## 2019-09-13 DIAGNOSIS — E785 Hyperlipidemia, unspecified: Secondary | ICD-10-CM | POA: Diagnosis not present

## 2019-09-13 DIAGNOSIS — E559 Vitamin D deficiency, unspecified: Secondary | ICD-10-CM | POA: Diagnosis not present

## 2019-09-20 DIAGNOSIS — I10 Essential (primary) hypertension: Secondary | ICD-10-CM | POA: Diagnosis not present

## 2019-09-20 DIAGNOSIS — J449 Chronic obstructive pulmonary disease, unspecified: Secondary | ICD-10-CM | POA: Diagnosis not present

## 2019-09-20 DIAGNOSIS — Z0000 Encounter for general adult medical examination without abnormal findings: Secondary | ICD-10-CM | POA: Diagnosis not present

## 2019-09-20 DIAGNOSIS — K5730 Diverticulosis of large intestine without perforation or abscess without bleeding: Secondary | ICD-10-CM | POA: Diagnosis not present

## 2019-09-20 DIAGNOSIS — J45909 Unspecified asthma, uncomplicated: Secondary | ICD-10-CM | POA: Diagnosis not present

## 2019-09-20 DIAGNOSIS — E559 Vitamin D deficiency, unspecified: Secondary | ICD-10-CM | POA: Diagnosis not present

## 2019-09-20 DIAGNOSIS — E785 Hyperlipidemia, unspecified: Secondary | ICD-10-CM | POA: Diagnosis not present

## 2019-09-20 DIAGNOSIS — M1990 Unspecified osteoarthritis, unspecified site: Secondary | ICD-10-CM | POA: Diagnosis not present

## 2019-09-20 DIAGNOSIS — J479 Bronchiectasis, uncomplicated: Secondary | ICD-10-CM | POA: Diagnosis not present

## 2019-09-20 DIAGNOSIS — H9190 Unspecified hearing loss, unspecified ear: Secondary | ICD-10-CM | POA: Diagnosis not present

## 2019-09-26 DIAGNOSIS — R69 Illness, unspecified: Secondary | ICD-10-CM | POA: Diagnosis not present

## 2019-10-02 DIAGNOSIS — K580 Irritable bowel syndrome with diarrhea: Secondary | ICD-10-CM | POA: Diagnosis not present

## 2019-10-03 ENCOUNTER — Other Ambulatory Visit: Payer: Self-pay | Admitting: Internal Medicine

## 2019-10-03 DIAGNOSIS — E559 Vitamin D deficiency, unspecified: Secondary | ICD-10-CM

## 2019-10-03 DIAGNOSIS — M8580 Other specified disorders of bone density and structure, unspecified site: Secondary | ICD-10-CM

## 2019-10-03 DIAGNOSIS — M1990 Unspecified osteoarthritis, unspecified site: Secondary | ICD-10-CM

## 2019-10-21 ENCOUNTER — Ambulatory Visit: Payer: Medicare HMO | Admitting: Pulmonary Disease

## 2019-10-24 ENCOUNTER — Ambulatory Visit: Payer: Medicare HMO | Admitting: Pulmonary Disease

## 2019-10-30 ENCOUNTER — Other Ambulatory Visit: Payer: Self-pay

## 2019-10-30 ENCOUNTER — Ambulatory Visit
Admission: RE | Admit: 2019-10-30 | Discharge: 2019-10-30 | Disposition: A | Discharge status: Home / Self Care | Payer: Medicare HMO | Source: Ambulatory Visit | Attending: Internal Medicine | Admitting: Internal Medicine

## 2019-10-30 DIAGNOSIS — Z780 Asymptomatic menopausal state: Secondary | ICD-10-CM | POA: Diagnosis not present

## 2019-10-30 DIAGNOSIS — E559 Vitamin D deficiency, unspecified: Secondary | ICD-10-CM

## 2019-10-30 DIAGNOSIS — M8580 Other specified disorders of bone density and structure, unspecified site: Secondary | ICD-10-CM

## 2019-10-30 DIAGNOSIS — M1990 Unspecified osteoarthritis, unspecified site: Secondary | ICD-10-CM

## 2019-10-30 DIAGNOSIS — M810 Age-related osteoporosis without current pathological fracture: Secondary | ICD-10-CM | POA: Diagnosis not present

## 2019-11-08 ENCOUNTER — Telehealth: Payer: Self-pay | Admitting: Cardiology

## 2019-11-08 NOTE — Telephone Encounter (Signed)
Spoke with the patient who states that she was at her PCP office recently and BP was elevated at 140/?[name] She was advised to monitor her BP at home which she had not been doing recently. She reports that this morning about an hour ago she took her BP and it was 170/96. She states that she had already taken her medications and decided to take an additional 1/2 tablet (25 mg) of Toprol. She reports other recent BP readings of 142/81, 139/69, and 112/65. Patient denies any chest pain, shortness of breath, headaches or dizziness. She has not increased salt in her diet. She states that she has been eating well and working out 2-3 times per week. She states that she is very nervous. I tried to reassure her to not be nervous.  She retook BP while on the phone and reports 176/103. Advised patient to rest and continue monitoring symptoms and will let her know if Dr. Radford Pax has any recommendations.

## 2019-11-08 NOTE — Telephone Encounter (Signed)
Follow Up:     Pt says she needs to talk to [place] again. She says her pressure have not gone down.

## 2019-11-08 NOTE — Telephone Encounter (Signed)
Pt c/o BP issue: STAT if pt c/o blurred vision, one-sided weakness or slurred speech  1. What are your last 5 BP readings? 170/96, 112/65  2. Are you having any other symptoms (ex. Dizziness, headache, blurred vision, passed out)? No   3. What is your BP issue? BP is elevated

## 2019-11-08 NOTE — Telephone Encounter (Signed)
Spoke with the patient who states that she retook her BP around 1:45pm and it was 170/96. She states that she has taken another 1/2 tablet of Toprol. She states that she also added a "jigger of vodka" to her cranberry juice. She states most recent blood pressure was 143/82. I advised the patient to follow up with her PCP in regards to BP and anxiety. Patient states that she will talk with them and continue to monitor.

## 2019-11-08 NOTE — Telephone Encounter (Signed)
I would like her to discuss this with her PCP who needs to treat her anxiety and manage her HTN

## 2019-11-12 NOTE — Progress Notes (Signed)
Cardiology Office Note    Date:  11/14/2019   ID:  MAYRENE BASTARACHE, DOB 29-Jun-1930, MRN [id number]  PCP:  Crist Infante, MD  Cardiologist:  Fransico Him, MD  Electrophysiologist:  None   Chief Complaint: blood pressure issues  History of Present Illness:   [name] [name] is a 86 y.o. female with history of PVCs, hypertension, asymptomatic bradycardia, breast cancer remotely, COPD, hyperlipidemia (followed by PCP) who is seen back for evaluation of blood pressure.  She follows with Dr. Radford Pax for history of PVCs.  She has no prior echocardiogram on file and has done well on metoprolol over the years.  She has recently had issues with blood pressure, prompting today's visit. She recently began working with a physical trainer and read that strength training can increase BP so she began tracking it. On 11/08/19 she noticed unusually high BP throughout the whole day (up to 527P systolic). However since that time her AM BPs are largely controlled but she has still intermittently noticed BP spikes in the afternoon, mid 824M-353I systolic. She otherwise is feeling fine without any symptoms to report. She is tolerating exercise well and loves it. She drinks 1 alcoholic beverage nightly. Denies excess salt intake.  Labwork independently reviewed: 12/2018 KPN LDL 77, rig 110, Hgb 12.8, Cr 0.80, ALT 8 07/2018 TSH wnl 2018 Glucose 166, K 4.5, Cr 1.0, Hgb 13.6, Plt 263   Past Medical History:  Diagnosis Date  . Anxiety   . Bradycardia    asymptomatic  . Breast CA ([place]) 1993 OR 1994   LEFT, SURGERY AND RADIATION DONE  . Breast cancer ([place])   . COPD (chronic obstructive pulmonary disease) (HCC)    MILD, NO INHALERS USED  . DEGENERATIVE JOINT DISEASE, KNEE   . Family history of anesthesia complication    NEPHEW NAUSEA/VOMITING  . GENU VALGUM   . Hyperlipidemia   . Hypertension   . Numbness of leg 2011   just left shin  . 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 Pneumonia 2013     X 2  . PVC's (premature ventricular contractions)   . ROTATOR CUFF SYNDROME, LEFT    CANNOT LIFT LEFT ARM ALL THE WAY UP  . UNEQUAL LEG LENGTH   . UTI (urinary tract infection)    STARTED AUGMENTIN  ON 09-10-13    Past Surgical History:  Procedure Laterality Date  . APPENDECTOMY  AGE 40  . BREAST LUMPECTOMY Left 1991   radiation  . BREAST SURGERY Left 1993 OR 1994   LUMPECTOMY AND RADIATION DONE  . JOINT REPLACEMENT    . NASAL SINUS SURGERY  2006  . REVERSE SHOULDER ARTHROPLASTY Left 07/31/2015   Procedure: LEFT REVERSE SHOULDER ARTHROPLASTY;  Surgeon: Netta Cedars, MD;  Location: [facility];  Service: Orthopedics;  Laterality: Left;  . TOTAL HIP ARTHROPLASTY Bilateral LEFT 2008 AND RIGHT 2010  . TOTAL KNEE ARTHROPLASTY Left 09/17/2013   Procedure: LEFT TOTAL KNEE ARTHROPLASTY;  Surgeon: Mauri Pole, MD;  Location[facility];  Service: Orthopedics;  Laterality: Left;    Current Medications: Current Meds  Medication Sig  . Albuterol Sulfate (PROAIR RESPICLICK) 144 (90 Base) MCG/ACT AEPB Inhale 1 puff into the lungs every 6 (six) hours as needed (wheezing).  [name] amoxicillin (AMOXIL) 500 MG capsule Take 2,000 mg by mouth See admin instructions. Pt takes prior to dental appointments  . atorvastatin (LIPITOR) 20 MG tablet Take 20 mg by mouth every evening.   [name] BIOTIN PO Take  1 tablet by mouth daily as needed (supplement).  . colestipol (COLESTID) 1 g tablet Take 1-2 g by mouth 3 (three) times a week.   . diphenoxylate-atropine (LOMOTIL) 2.5-0.025 MG per tablet Take 1 tablet by mouth 4 (four) times daily as needed for diarrhea or loose stools.  [name] escitalopram (LEXAPRO) 10 MG tablet Take 10 mg by mouth every morning.  . eszopiclone (LUNESTA) 2 MG TABS tablet Take 1 mg by mouth at bedtime as needed for sleep.   [name] Fluticasone-Salmeterol (ADVAIR DISKUS) 250-50 MCG/DOSE AEPB Advair Diskus 250 mcg-50 mcg/dose powder for inhalation  . lisinopril (ZESTRIL) 10 MG tablet TAKE 1 TABLET DAILY  .  LORazepam (ATIVAN) 0.5 MG tablet Take 0.25 mg by mouth every 8 (eight) hours as needed for anxiety.  . metoprolol succinate (TOPROL-XL) 50 MG 24 hr tablet TAKE 1 TABLET(50 MG) BY MOUTH DAILY WITH OR IMMEDIATELY FOLLOWING A MEAL  . Propylene Glycol-Glycerin (SOOTHE) 0.6-0.6 % SOLN Apply to eye.  [name] Respiratory Therapy Supplies (FLUTTER) DEVI Use as directed  . Vitamin D, Ergocalciferol, (DRISDOL) 50000 UNITS CAPS capsule Take 50,000 Units by mouth 2 (two) times a week. Mondays   . [DISCONTINUED] benzonatate (TESSALON) 100 MG capsule TK 1 TO 2 CS PO Q 8 H PRF COUGH      Allergies:   Other and Cardizem [diltiazem hcl]   Social History   Socioeconomic History  . Marital status: Widowed    Spouse name: Not on file  . Number of children: Not on file  . Years of education: Not on file  . Highest education level: Not on file  Occupational History  . Not on file  Tobacco Use  . Smoking status: Former Smoker    Packs/day: 0.25    Years: 15.00    Pack years: 3.75    Types: Cigarettes    Quit date: 01/18/1992    Years since quitting: 27.8  . Smokeless tobacco: Never Used  Substance and Sexual Activity  . Alcohol use: Yes    Comment: WINE 2 GLASSES PER DAY  . Drug use: No  . Sexual activity: Not on file  Other Topics Concern  . Not on file  Social History Narrative  . Not on file   Social Determinants of Health   Financial Resource Strain:   . Difficulty of Paying Living Expenses: Not on file  Food Insecurity:   . Worried About Charity fundraiser in the Last Year: Not on file  . Ran Out of Food in the Last Year: Not on file  Transportation Needs:   . Lack of Transportation (Medical): Not on file  . Lack of Transportation (Non-Medical): Not on file  Physical Activity:   . Days of Exercise per Week: Not on file  . Minutes of Exercise per Session: Not on file  Stress:   . Feeling of Stress : Not on file  Social Connections:   . Frequency of Communication with Friends and Family:  Not on file  . Frequency of Social Gatherings with Friends and Family: Not on file  . Attends Religious Services: Not on file  . Active Member of Clubs or Organizations: Not on file  . Attends Archivist Meetings: Not on file  . Marital Status: Not on file     Family History:  The patient's family history includes Diabetes in her mother; Heart Problems in her father; Hypertension in her brother.  ROS:   Please see the history of present illness.  All other systems are  reviewed and otherwise negative.    EKGs/Labs/Other Studies Reviewed:    Studies reviewed are outlined and summarized above. Reports included below if pertinent.  None    EKG:  EKG is ordered today, personally reviewed, demonstrating NSR 61bpm, baselinewander, nonspecific TW changes without acute change from prior  Recent Labs: No results found for requested labs within last 8760 hours.  Recent Lipid Panel No results found for: CHOL, TRIG, HDL, CHOLHDL, VLDL, LDLCALC, LDLDIRECT  PHYSICAL EXAM:    VS:  BP 124/82   Pulse 61   Ht 5' 4.5" (1.638 m)   Wt 145 lb 3.2 oz (65.9 kg)   SpO2 97%   BMI 24.54 kg/m   BMI: Body mass index is 24.54 kg/m.  GEN: Well nourished, well developed lively WF, in no acute distress HEENT: normocephalic, atraumatic Neck: no JVD, carotid bruits, or masses Cardiac: RRR; no murmurs, rubs, or gallops, no edema  Respiratory:  clear to auscultation bilaterally, normal work of breathing GI: soft, nontender, nondistended, + BS MS: no deformity or atrophy Skin: warm and dry, no rash Neuro:  Alert and Oriented x 3, Strength and sensation are intact, follows commands Psych: euthymic mood, full affect  Wt Readings from Last 3 Encounters:  11/14/19 145 lb 3.2 oz (65.9 kg)  03/05/19 146 lb 6.4 oz (66.4 kg)  03/16/18 143 lb (64.9 kg)     ASSESSMENT & PLAN:   1. Essential HTN - morning/daytime BP seem largely controlled with mild elevation in the evenings. Given her age and  clinical stability otherwise, I do not want to be overly aggressive with lowering. Would first start by splitting her existing lisinopril up into 5mg  BID. The patient was provided instructions on monitoring blood pressure at home for 1 week and relaying results to our office. She also reports she had bloodwork within the last year with Dr. Joylene Draft that I am not seeing on [place]. Will see if we can call to get a copy. 2. PVCs - quiescent on metoprolol. No symptoms to suggest bradycardia.  Disposition: F/u with Dr. Radford Pax as previously recommended in 02/2020.  Medication Adjustments/Labs and Tests Ordered: Current medicines are reviewed at length with the patient today.  Concerns regarding medicines are outlined above. Medication changes, Labs and Tests ordered today are summarized above and listed in the Patient Instructions accessible in Encounters.   Signed, Charlie Pitter, PA-C  11/14/2019 9:34 AM    [address], [place], [place]  [address] Phone: [phone]; Fax: [phone]

## 2019-11-14 ENCOUNTER — Ambulatory Visit (INDEPENDENT_AMBULATORY_CARE_PROVIDER_SITE_OTHER): Payer: Medicare HMO | Admitting: Physician Assistant

## 2019-11-14 ENCOUNTER — Encounter: Payer: Self-pay | Admitting: Physician Assistant

## 2019-11-14 ENCOUNTER — Other Ambulatory Visit: Payer: Self-pay

## 2019-11-14 VITALS — BP 124/82 | HR 61 | Ht 64.5 in | Wt 145.2 lb

## 2019-11-14 DIAGNOSIS — I493 Ventricular premature depolarization: Secondary | ICD-10-CM

## 2019-11-14 DIAGNOSIS — I10 Essential (primary) hypertension: Secondary | ICD-10-CM

## 2019-11-14 MED ORDER — LISINOPRIL 5 MG PO TABS
5.0000 mg | ORAL_TABLET | Freq: Two times a day (BID) | ORAL | 3 refills | Status: DC
Start: 2019-11-14 — End: 2020-05-21

## 2019-11-14 NOTE — Patient Instructions (Addendum)
Medication Instructions:  Your physician has recommended you make the following change in your medication:  1.  CHANGE the Lisinopril to 5 mg taking 1 tablet twice a day  *If you need a refill on your cardiac medications before your next appointment, please call your pharmacy*   Lab Work: None ordered  If you have labs (blood work) drawn today and your tests are completely normal, you will receive your results only by: [name] MyChart Message (if you have MyChart) OR . A paper copy in the mail If you have any lab test that is abnormal or we need to change your treatment, we will call you to review the results.   Testing/Procedures: None ordered   Follow-Up: At [facility], you and your health needs are our priority.  As part of our continuing mission to provide you with exceptional heart care, we have created designated Provider Care Teams.  These Care Teams include your primary Cardiologist (physician) and Advanced Practice Providers (APPs -  Physician Assistants and Nurse Practitioners) who all work together to provide you with the care you need, when you need it.  We recommend signing up for the patient portal called "MyChart".  Sign up information is provided on this After Visit Summary.  MyChart is used to connect with patients for Virtual Visits (Telemedicine).  Patients are able to view lab/test results, encounter notes, upcoming appointments, etc.  Non-urgent messages can be sent to your provider as well.   To learn more about what you can do with MyChart, go to [email or web address].    Your next appointment:   4 month(s)  The format for your next appointment:   In Person  Provider:   Fransico Him, MD   Other Instructions I would recommend using a blood pressure cuff that goes on your arm. The wrist ones can be inaccurate. If possible, try to select one that also reports your heart rate. Remain seated in a chair for 3-4 minutes quietly beforehand, then check it. Please  record a list of those readings twice a day and call us/send in MyChart message with them for our review in 1 week.

## 2019-11-26 ENCOUNTER — Encounter (INDEPENDENT_AMBULATORY_CARE_PROVIDER_SITE_OTHER): Payer: Medicare HMO | Admitting: Ophthalmology

## 2019-11-26 ENCOUNTER — Other Ambulatory Visit: Payer: Self-pay

## 2019-11-26 DIAGNOSIS — H35033 Hypertensive retinopathy, bilateral: Secondary | ICD-10-CM | POA: Diagnosis not present

## 2019-11-26 DIAGNOSIS — I10 Essential (primary) hypertension: Secondary | ICD-10-CM

## 2019-11-26 DIAGNOSIS — H353132 Nonexudative age-related macular degeneration, bilateral, intermediate dry stage: Secondary | ICD-10-CM

## 2019-11-26 DIAGNOSIS — H43813 Vitreous degeneration, bilateral: Secondary | ICD-10-CM | POA: Diagnosis not present

## 2019-12-05 DIAGNOSIS — R69 Illness, unspecified: Secondary | ICD-10-CM | POA: Diagnosis not present

## 2020-02-04 ENCOUNTER — Encounter (INDEPENDENT_AMBULATORY_CARE_PROVIDER_SITE_OTHER): Payer: Medicare HMO | Admitting: Ophthalmology

## 2020-03-03 ENCOUNTER — Ambulatory Visit (INDEPENDENT_AMBULATORY_CARE_PROVIDER_SITE_OTHER): Payer: Medicare HMO | Admitting: Cardiology

## 2020-03-03 ENCOUNTER — Other Ambulatory Visit: Payer: Self-pay

## 2020-03-03 ENCOUNTER — Encounter: Payer: Self-pay | Admitting: Cardiology

## 2020-03-03 VITALS — BP 104/62 | HR 68 | Ht 65.0 in | Wt 138.0 lb

## 2020-03-03 DIAGNOSIS — I493 Ventricular premature depolarization: Secondary | ICD-10-CM

## 2020-03-03 DIAGNOSIS — I10 Essential (primary) hypertension: Secondary | ICD-10-CM

## 2020-03-03 LAB — BASIC METABOLIC PANEL
BUN/Creatinine Ratio: 24 (ref 12–28)
BUN: 25 mg/dL (ref 8–27)
CO2: 22 mmol/L (ref 20–29)
Calcium: 9.5 mg/dL (ref 8.7–10.3)
Chloride: 100 mmol/L (ref 96–106)
Creatinine, Ser: 1.03 mg/dL — ABNORMAL HIGH (ref 0.57–1.00)
GFR calc Af Amer: 56 mL/min/{1.73_m2} — ABNORMAL LOW (ref >59)
GFR calc non Af Amer: 49 mL/min/{1.73_m2} — ABNORMAL LOW (ref >59)
Glucose: 132 mg/dL — ABNORMAL HIGH (ref 65–99)
Potassium: 4.4 mmol/L (ref 3.5–5.2)
Sodium: 139 mmol/L (ref 134–144)

## 2020-03-03 NOTE — Patient Instructions (Signed)
Medication Instructions:  Your physician recommends that you continue on your current medications as directed. Please refer to the Current Medication list given to you today.  *If you need a refill on your cardiac medications before your next appointment, please call your pharmacy*   Lab Work: TODAY: BMET If you have labs (blood work) drawn today and your tests are completely normal, you will receive your results only by: MyChart Message (if you have MyChart) OR A paper copy in the mail If you have any lab test that is abnormal or we need to change your treatment, we will call you to review the results.   Follow-Up: At [facility], you and your health needs are our priority.  As part of our continuing mission to provide you with exceptional heart care, we have created designated Provider Care Teams.  These Care Teams include your primary Cardiologist (physician) and Advanced Practice Providers (APPs -  Physician Assistants and Nurse Practitioners) who all work together to provide you with the care you need, when you need it.   Your next appointment:   1 year(s)  The format for your next appointment:   In Person  Provider:   You may see Traci Turner, MD or one of the following Advanced Practice Providers on your designated Care Team:   Dayna Dunn, PA-C Michele Lenze, PA-C   

## 2020-03-03 NOTE — Progress Notes (Signed)
Cardiology Office Note:    Date:  03/03/2020   ID:  [name] [name], DOB 1930-05-03, MRN [id number]  PCP:  Crist Infante, MD  Cardiologist:  Fransico Him, MD    Referring MD: Crist Infante, MD   Chief Complaint  Patient presents with  . Follow-up    PVCs and HTN    History of Present Illness:    [name] [name] is a 87 y.o. female with a hx of PVC's and HTN.  She is here today for followup and is doing well.  She denies any chest pain or pressure, SOB, DOE, PND, orthopnea, LE edema, dizziness, palpitations or syncope. She is compliant with her meds and is tolerating meds with no SE.   Past Medical History:  Diagnosis Date  . Anxiety   . Bradycardia    asymptomatic  . Breast CA ([place]) 1993 OR 1994   LEFT, SURGERY AND RADIATION DONE  . Breast cancer ([place])   . COPD (chronic obstructive pulmonary disease) (HCC)    MILD, NO INHALERS USED  . DEGENERATIVE JOINT DISEASE, KNEE   . Family history of anesthesia complication    NEPHEW NAUSEA/VOMITING  . GENU VALGUM   . Hyperlipidemia   . Hypertension   . Numbness of leg 2011   just left shin  . 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 Pneumonia 2013   X 2  . PVC's (premature ventricular contractions)   . ROTATOR CUFF SYNDROME, LEFT    CANNOT LIFT LEFT ARM ALL THE WAY UP  . UNEQUAL LEG LENGTH   . UTI (urinary tract infection)    STARTED AUGMENTIN  ON 09-10-13    Past Surgical History:  Procedure Laterality Date  . APPENDECTOMY  AGE 17  . BREAST LUMPECTOMY Left 1991   radiation  . BREAST SURGERY Left 1993 OR 1994   LUMPECTOMY AND RADIATION DONE  . JOINT REPLACEMENT    . NASAL SINUS SURGERY  2006  . REVERSE SHOULDER ARTHROPLASTY Left 07/31/2015   Procedure: LEFT REVERSE SHOULDER ARTHROPLASTY;  Surgeon: Netta Cedars, MD;  Location: [facility];  Service: Orthopedics;  Laterality: Left;  . TOTAL HIP ARTHROPLASTY Bilateral LEFT 2008 AND RIGHT 2010  . TOTAL KNEE ARTHROPLASTY Left 09/17/2013   Procedure: LEFT TOTAL KNEE  ARTHROPLASTY;  Surgeon: Mauri Pole, MD;  Location[facility];  Service: Orthopedics;  Laterality: Left;    Current Medications: No outpatient medications have been marked as taking for the 03/03/20 encounter (Office Visit) with Sueanne Margarita, MD.     Allergies:   Other and Cardizem [diltiazem hcl]   Social History   Socioeconomic History  . Marital status: Widowed    Spouse name: Not on file  . Number of children: Not on file  . Years of education: Not on file  . Highest education level: Not on file  Occupational History  . Not on file  Tobacco Use  . Smoking status: Former Smoker    Packs/day: 0.25    Years: 15.00    Pack years: 3.75    Types: Cigarettes    Quit date: 01/18/1992    Years since quitting: 28.1  . Smokeless tobacco: Never Used  Substance and Sexual Activity  . Alcohol use: Yes    Comment: WINE 2 GLASSES PER DAY  . Drug use: No  . Sexual activity: Not on file  Other Topics Concern  . Not on file  Social History Narrative  . Not on file   Social Determinants of  Health   Financial Resource Strain: Not on file  Food Insecurity: Not on file  Transportation Needs: Not on file  Physical Activity: Not on file  Stress: Not on file  Social Connections: Not on file     Family History: The patient's family history includes Diabetes in her mother; Heart Problems in her father; Hypertension in her brother.  ROS:   Please see the history of present illness.    ROS  All other systems reviewed and negative.   EKGs/Labs/Other Studies Reviewed:    The following studies were reviewed today: Outside labs from [facility]  EKG:  EKG is  ordered today.  The ekg ordered today demonstrates NSR with nonspecific t wave abnormality  Recent Labs: No results found for requested labs within last 8760 hours.   Recent Lipid Panel No results found for: CHOL, TRIG, HDL, CHOLHDL, VLDL, LDLCALC, LDLDIRECT  Physical Exam:    VS:  There were no vitals taken for this visit.     Wt Readings from Last 3 Encounters:  11/14/19 145 lb 3.2 oz (65.9 kg)  03/05/19 146 lb 6.4 oz (66.4 kg)  03/16/18 143 lb (64.9 kg)     GEN: Well nourished, well developed in no acute distress HEENT: Normal NECK: No JVD; No carotid bruits LYMPHATICS: No lymphadenopathy CARDIAC:RRR, no murmurs, rubs, gallops RESPIRATORY:  Clear to auscultation without rales, wheezing or rhonchi  ABDOMEN: Soft, non-tender, non-distended MUSCULOSKELETAL:  No edema; No deformity  SKIN: Warm and dry NEUROLOGIC:  Alert and oriented x 3 PSYCHIATRIC:  Normal affect    ASSESSMENT:    1. PVC's (premature ventricular contractions)   2. Primary hypertension    PLAN:    In order of problems listed above:  1.  PVCs -PVCs well suppressed on BB -continue Toprol XL 50mg  daily  2.  HTN -BP is adequately controlled on exam today -continue Toprol XL 50mg  daily and Lisinopril 5mg  BID   Medication Adjustments/Labs and Tests Ordered: Current medicines are reviewed at length with the patient today.  Concerns regarding medicines are outlined above.  No orders of the defined types were placed in this encounter.  No orders of the defined types were placed in this encounter.   Signed, Fransico Him, MD  03/03/2020 9:14 AM    [place]

## 2020-03-25 ENCOUNTER — Other Ambulatory Visit: Payer: Self-pay | Admitting: Cardiology

## 2020-05-21 ENCOUNTER — Other Ambulatory Visit: Payer: Self-pay

## 2020-05-21 MED ORDER — LISINOPRIL 5 MG PO TABS: 5.0000 mg | ORAL_TABLET | Freq: Two times a day (BID) | ORAL | 3 refills | Status: DC

## 2020-05-21 NOTE — Telephone Encounter (Signed)
Pt's medication was sent to pt's pharmacy as requested. Confirmation received.  °

## 2020-05-26 ENCOUNTER — Other Ambulatory Visit: Payer: Self-pay

## 2020-05-26 MED ORDER — LISINOPRIL 5 MG PO TABS: 5.0000 mg | ORAL_TABLET | Freq: Two times a day (BID) | ORAL | 0 refills | Status: DC

## 2020-05-26 MED ORDER — LISINOPRIL 5 MG PO TABS: 30.0000 mg | ORAL_TABLET | Freq: Two times a day (BID) | ORAL | 0 refills | Status: DC

## 2020-05-26 NOTE — Telephone Encounter (Signed)
Pt's medication was sent to pt's pharmacy as requested. Confirmation received.  °

## 2020-05-26 NOTE — Addendum Note (Signed)
Addended by: Carter Kitten D on: 05/26/2020 04:34 PM   Modules accepted: Orders

## 2020-05-28 ENCOUNTER — Telehealth: Payer: Self-pay | Admitting: Cardiology

## 2020-05-28 DIAGNOSIS — Z96649 Presence of unspecified artificial hip joint: Secondary | ICD-10-CM | POA: Diagnosis not present

## 2020-05-28 DIAGNOSIS — G3184 Mild cognitive impairment, so stated: Secondary | ICD-10-CM | POA: Diagnosis not present

## 2020-05-28 DIAGNOSIS — I10 Essential (primary) hypertension: Secondary | ICD-10-CM | POA: Diagnosis not present

## 2020-05-28 DIAGNOSIS — R32 Unspecified urinary incontinence: Secondary | ICD-10-CM | POA: Diagnosis not present

## 2020-05-28 DIAGNOSIS — R69 Illness, unspecified: Secondary | ICD-10-CM | POA: Diagnosis not present

## 2020-05-28 DIAGNOSIS — Z008 Encounter for other general examination: Secondary | ICD-10-CM | POA: Diagnosis not present

## 2020-05-28 DIAGNOSIS — E785 Hyperlipidemia, unspecified: Secondary | ICD-10-CM | POA: Diagnosis not present

## 2020-05-28 DIAGNOSIS — Z8249 Family history of ischemic heart disease and other diseases of the circulatory system: Secondary | ICD-10-CM | POA: Diagnosis not present

## 2020-05-28 DIAGNOSIS — Z87891 Personal history of nicotine dependence: Secondary | ICD-10-CM | POA: Diagnosis not present

## 2020-05-28 NOTE — Telephone Encounter (Signed)
Pt c/o medication issue:  1. Name of Medication: lisinopril (ZESTRIL) 5 MG tablet  2. How are you currently taking this medication (dosage and times per day)?  3. Are you having a reaction (difficulty breathing--STAT)?No  4. What is your medication issue?  [facility] have 2 different dosages for this medicine   Please call to verify

## 2020-05-28 NOTE — Telephone Encounter (Signed)
From last office note : 2.  HTN -BP is adequately controlled on exam today -continue Toprol XL 50mg  daily and Lisinopril 5mg  BID  Called and verified prescription.

## 2020-06-19 ENCOUNTER — Other Ambulatory Visit: Payer: Self-pay | Admitting: Cardiology

## 2020-07-01 ENCOUNTER — Other Ambulatory Visit: Payer: Self-pay | Admitting: Internal Medicine

## 2020-07-01 DIAGNOSIS — Z1231 Encounter for screening mammogram for malignant neoplasm of breast: Secondary | ICD-10-CM

## 2020-08-25 ENCOUNTER — Ambulatory Visit: Payer: Medicare HMO

## 2020-09-09 ENCOUNTER — Ambulatory Visit: Payer: Medicare HMO

## 2020-09-12 ENCOUNTER — Ambulatory Visit
Admission: RE | Admit: 2020-09-12 | Discharge: 2020-09-12 | Disposition: A | Discharge status: Home / Self Care | Payer: Medicare HMO | Source: Ambulatory Visit | Attending: Internal Medicine | Admitting: Internal Medicine

## 2020-09-12 DIAGNOSIS — Z1231 Encounter for screening mammogram for malignant neoplasm of breast: Secondary | ICD-10-CM | POA: Diagnosis not present

## 2020-09-16 DIAGNOSIS — E785 Hyperlipidemia, unspecified: Secondary | ICD-10-CM | POA: Diagnosis not present

## 2020-09-16 DIAGNOSIS — J449 Chronic obstructive pulmonary disease, unspecified: Secondary | ICD-10-CM | POA: Diagnosis not present

## 2020-09-16 DIAGNOSIS — I10 Essential (primary) hypertension: Secondary | ICD-10-CM | POA: Diagnosis not present

## 2020-09-30 DIAGNOSIS — E559 Vitamin D deficiency, unspecified: Secondary | ICD-10-CM | POA: Diagnosis not present

## 2020-09-30 DIAGNOSIS — E785 Hyperlipidemia, unspecified: Secondary | ICD-10-CM | POA: Diagnosis not present

## 2020-10-07 DIAGNOSIS — Z1389 Encounter for screening for other disorder: Secondary | ICD-10-CM | POA: Diagnosis not present

## 2020-10-07 DIAGNOSIS — Z0000 Encounter for general adult medical examination without abnormal findings: Secondary | ICD-10-CM | POA: Diagnosis not present

## 2020-10-07 DIAGNOSIS — E785 Hyperlipidemia, unspecified: Secondary | ICD-10-CM | POA: Diagnosis not present

## 2020-10-07 DIAGNOSIS — Z23 Encounter for immunization: Secondary | ICD-10-CM | POA: Diagnosis not present

## 2020-10-07 DIAGNOSIS — M8580 Other specified disorders of bone density and structure, unspecified site: Secondary | ICD-10-CM | POA: Diagnosis not present

## 2020-10-07 DIAGNOSIS — I10 Essential (primary) hypertension: Secondary | ICD-10-CM | POA: Diagnosis not present

## 2020-10-07 DIAGNOSIS — J449 Chronic obstructive pulmonary disease, unspecified: Secondary | ICD-10-CM | POA: Diagnosis not present

## 2020-10-07 DIAGNOSIS — E559 Vitamin D deficiency, unspecified: Secondary | ICD-10-CM | POA: Diagnosis not present

## 2020-10-07 DIAGNOSIS — R82998 Other abnormal findings in urine: Secondary | ICD-10-CM | POA: Diagnosis not present

## 2020-10-07 DIAGNOSIS — R69 Illness, unspecified: Secondary | ICD-10-CM | POA: Diagnosis not present

## 2020-10-07 DIAGNOSIS — Z1331 Encounter for screening for depression: Secondary | ICD-10-CM | POA: Diagnosis not present

## 2020-10-07 DIAGNOSIS — M1990 Unspecified osteoarthritis, unspecified site: Secondary | ICD-10-CM | POA: Diagnosis not present

## 2020-10-07 DIAGNOSIS — I5189 Other ill-defined heart diseases: Secondary | ICD-10-CM | POA: Diagnosis not present

## 2020-10-07 DIAGNOSIS — H9190 Unspecified hearing loss, unspecified ear: Secondary | ICD-10-CM | POA: Diagnosis not present

## 2020-10-07 DIAGNOSIS — R413 Other amnesia: Secondary | ICD-10-CM | POA: Diagnosis not present

## 2020-10-15 ENCOUNTER — Other Ambulatory Visit: Payer: Self-pay | Admitting: Internal Medicine

## 2020-10-15 DIAGNOSIS — R413 Other amnesia: Secondary | ICD-10-CM

## 2020-10-16 DIAGNOSIS — I10 Essential (primary) hypertension: Secondary | ICD-10-CM | POA: Diagnosis not present

## 2020-10-16 DIAGNOSIS — J449 Chronic obstructive pulmonary disease, unspecified: Secondary | ICD-10-CM | POA: Diagnosis not present

## 2020-10-16 DIAGNOSIS — E785 Hyperlipidemia, unspecified: Secondary | ICD-10-CM | POA: Diagnosis not present

## 2020-11-03 ENCOUNTER — Other Ambulatory Visit: Payer: Medicare HMO

## 2020-11-14 ENCOUNTER — Other Ambulatory Visit: Payer: Medicare HMO

## 2020-11-25 ENCOUNTER — Encounter (INDEPENDENT_AMBULATORY_CARE_PROVIDER_SITE_OTHER): Payer: Medicare HMO | Admitting: Ophthalmology

## 2020-11-25 ENCOUNTER — Other Ambulatory Visit: Payer: Medicare HMO

## 2020-12-07 ENCOUNTER — Encounter (INDEPENDENT_AMBULATORY_CARE_PROVIDER_SITE_OTHER): Payer: Medicare HMO | Admitting: Ophthalmology

## 2020-12-13 ENCOUNTER — Other Ambulatory Visit: Payer: Medicare HMO

## 2020-12-18 ENCOUNTER — Other Ambulatory Visit: Payer: Medicare HMO

## 2020-12-29 ENCOUNTER — Encounter (INDEPENDENT_AMBULATORY_CARE_PROVIDER_SITE_OTHER): Payer: Medicare HMO | Admitting: Ophthalmology

## 2021-01-02 ENCOUNTER — Other Ambulatory Visit: Payer: Medicare HMO

## 2021-01-20 ENCOUNTER — Other Ambulatory Visit: Payer: Medicare HMO

## 2021-01-22 ENCOUNTER — Encounter (INDEPENDENT_AMBULATORY_CARE_PROVIDER_SITE_OTHER): Payer: Medicare HMO | Admitting: Ophthalmology

## 2021-01-22 ENCOUNTER — Other Ambulatory Visit: Payer: Self-pay

## 2021-01-22 DIAGNOSIS — H35033 Hypertensive retinopathy, bilateral: Secondary | ICD-10-CM

## 2021-01-22 DIAGNOSIS — H353132 Nonexudative age-related macular degeneration, bilateral, intermediate dry stage: Secondary | ICD-10-CM | POA: Diagnosis not present

## 2021-01-22 DIAGNOSIS — H43813 Vitreous degeneration, bilateral: Secondary | ICD-10-CM

## 2021-01-22 DIAGNOSIS — I10 Essential (primary) hypertension: Secondary | ICD-10-CM

## 2021-01-30 ENCOUNTER — Other Ambulatory Visit: Payer: Medicare HMO

## 2021-02-12 DIAGNOSIS — E785 Hyperlipidemia, unspecified: Secondary | ICD-10-CM | POA: Diagnosis not present

## 2021-02-12 DIAGNOSIS — R413 Other amnesia: Secondary | ICD-10-CM | POA: Diagnosis not present

## 2021-02-12 DIAGNOSIS — I10 Essential (primary) hypertension: Secondary | ICD-10-CM | POA: Diagnosis not present

## 2021-02-12 DIAGNOSIS — E538 Deficiency of other specified B group vitamins: Secondary | ICD-10-CM | POA: Diagnosis not present

## 2021-02-12 DIAGNOSIS — J449 Chronic obstructive pulmonary disease, unspecified: Secondary | ICD-10-CM | POA: Diagnosis not present

## 2021-02-14 DIAGNOSIS — M1990 Unspecified osteoarthritis, unspecified site: Secondary | ICD-10-CM | POA: Diagnosis not present

## 2021-02-14 DIAGNOSIS — I10 Essential (primary) hypertension: Secondary | ICD-10-CM | POA: Diagnosis not present

## 2021-02-14 DIAGNOSIS — J449 Chronic obstructive pulmonary disease, unspecified: Secondary | ICD-10-CM | POA: Diagnosis not present

## 2021-02-14 DIAGNOSIS — E785 Hyperlipidemia, unspecified: Secondary | ICD-10-CM | POA: Diagnosis not present

## 2021-02-23 ENCOUNTER — Ambulatory Visit
Admission: RE | Admit: 2021-02-23 | Discharge: 2021-02-23 | Disposition: A | Discharge status: Home / Self Care | Payer: Medicare HMO | Source: Ambulatory Visit | Attending: Internal Medicine | Admitting: Internal Medicine

## 2021-02-23 DIAGNOSIS — R413 Other amnesia: Secondary | ICD-10-CM | POA: Diagnosis not present

## 2021-02-23 DIAGNOSIS — G319 Degenerative disease of nervous system, unspecified: Secondary | ICD-10-CM | POA: Diagnosis not present

## 2021-02-23 DIAGNOSIS — J3489 Other specified disorders of nose and nasal sinuses: Secondary | ICD-10-CM | POA: Diagnosis not present

## 2021-02-23 DIAGNOSIS — I6782 Cerebral ischemia: Secondary | ICD-10-CM | POA: Diagnosis not present

## 2021-03-09 ENCOUNTER — Other Ambulatory Visit: Payer: Self-pay | Admitting: Internal Medicine

## 2021-03-09 DIAGNOSIS — J349 Unspecified disorder of nose and nasal sinuses: Secondary | ICD-10-CM

## 2021-03-16 ENCOUNTER — Telehealth: Payer: Self-pay | Admitting: Cardiology

## 2021-03-16 NOTE — Telephone Encounter (Signed)
Vernetta, Dizdarevic - 03/16/2021 10:17 AM Darrell Jewel, RN  Sent: Tue March 16, 2021  2:14 PM  To: [facility] Triage          Message    ----- Message -----  From: Sueanne Margarita, MD  Sent: 03/16/2021   1:16 PM EST  To: Darrell Jewel, RN    Since I have not seen her in over a year and she just saw her PCP and he has her on lisinopril I would like her to call her primary care physician to see what they want to do    Left the pt a message to call the office back to endorse recommendations as mentioned above, by Dr. Radford Pax.

## 2021-03-16 NOTE — Telephone Encounter (Signed)
Patient calling in with elevated BP readings.  Symptom of headache. Started noticing elevated BP ~ a few days ago. She has been experience more stress with the upcoming trip (flight Thursday) and moving soon. Patient states she did not write down the readings and only has the ones from this morning. States she takes her BP only when she "doesn't feel up to par."  Stating she took her BP this morning after she didn't sleep well and is feeling anxious. Prior to medication BP 186/112, and 173/98. Taking med 165/94. 40 minutes post meds 167/99. Educated patient about taking morning BP reading prior to med and advised going forward to take reading 2 hours post medication and write it down that way she can have them if needed in the future.   BP 160/95 currently 11 am (two hours post BP meds)   Last OV Feb 2022 BP 104/62.  BP pulled from PCP visit 02-12-21  BP 122/76  Made next available appt 05/04/21 with app. ED precaution given.  Will send to MD for advisement.

## 2021-03-16 NOTE — Telephone Encounter (Signed)
Pt c/o BP issue: STAT if pt c/o blurred vision, one-sided weakness or slurred speech  1. What are your last 5 BP readings?  03/16/21 186/112 7:58 AM 173/98 8:46 AM  Then took meds 165/94 9:00 AM 167/99 9:40 AM  2. Are you having any other symptoms (ex. Dizziness, headache, blurred vision, passed out)? Headache   3. What is your BP issue? Nervous about hypertension today. Is wanting to resolve this issue before leaving on Thursday to go out of town for a family wedding.

## 2021-03-17 ENCOUNTER — Other Ambulatory Visit: Payer: Self-pay

## 2021-03-17 ENCOUNTER — Ambulatory Visit (INDEPENDENT_AMBULATORY_CARE_PROVIDER_SITE_OTHER): Payer: Medicare HMO | Admitting: Physician Assistant

## 2021-03-17 ENCOUNTER — Encounter: Payer: Self-pay | Admitting: Physician Assistant

## 2021-03-17 VITALS — BP 128/76 | HR 67 | Ht 64.5 in | Wt 141.8 lb

## 2021-03-17 DIAGNOSIS — I493 Ventricular premature depolarization: Secondary | ICD-10-CM | POA: Diagnosis not present

## 2021-03-17 DIAGNOSIS — E7849 Other hyperlipidemia: Secondary | ICD-10-CM

## 2021-03-17 DIAGNOSIS — I10 Essential (primary) hypertension: Secondary | ICD-10-CM | POA: Diagnosis not present

## 2021-03-17 LAB — CBC
Hematocrit: 40 % (ref 34.0–46.6)
Hemoglobin: 13.3 g/dL (ref 11.1–15.9)
MCH: 31.7 pg (ref 26.6–33.0)
MCHC: 33.3 g/dL (ref 31.5–35.7)
MCV: 96 fL (ref 79–97)
Platelets: 238 10*3/uL (ref 150–450)
RBC: 4.19 x10E6/uL (ref 3.77–5.28)
RDW: 12.8 % (ref 11.7–15.4)
WBC: 7.1 10*3/uL (ref 3.4–10.8)

## 2021-03-17 LAB — BASIC METABOLIC PANEL
BUN/Creatinine Ratio: 15 (ref 12–28)
BUN: 15 mg/dL (ref 8–27)
CO2: 25 mmol/L (ref 20–29)
Calcium: 9.3 mg/dL (ref 8.7–10.3)
Chloride: 102 mmol/L (ref 96–106)
Creatinine, Ser: 1.01 mg/dL — ABNORMAL HIGH (ref 0.57–1.00)
Glucose: 95 mg/dL (ref 70–99)
Potassium: 4.5 mmol/L (ref 3.5–5.2)
Sodium: 137 mmol/L (ref 134–144)
eGFR: 53 mL/min/{1.73_m2} — ABNORMAL LOW (ref >59)

## 2021-03-17 LAB — TSH: TSH: 2.98 u[IU]/mL (ref 0.450–4.500)

## 2021-03-17 MED ORDER — LISINOPRIL 5 MG PO TABS: ORAL_TABLET | ORAL | 3 refills | Status: DC

## 2021-03-17 NOTE — Progress Notes (Signed)
Cardiology Office Note    Date:  03/17/2021   ID:  [name] [name], DOB 1931-01-05, MRN [id number]   PCP:  Crist Infante, [place]  Cardiologist:  Fransico Him, MD   Advanced Practice Provider:  No care team member to display Electrophysiologist:  None   [phone]   Chief Complaint  Patient presents with   Hypertension    History of Present Illness:  [name] [name] is a 88 y.o. female  with history of HTN, PVC's,HLD. Last saw Dr. Radford Pax 03/03/20 doing well on BB and lisinopril.  Patient called in yest with elevated BP's and anxiety over moving to [facility] and a flight tomorrow to [place]. Patient says she can't remember how high they were and didn't write them down. Still drives, very active-exercises 2-3 days a week and walks a mile and involved with the [employer]. Denies chest pain, dyspnea, palpitations. BP goes up when nervous or anxious. Eats mostly prepared foods.    Past Medical History:  Diagnosis Date   Anxiety    Bradycardia    asymptomatic   Breast CA ([place]) 1993 OR 1994   LEFT, SURGERY AND RADIATION DONE   Breast cancer ([place])    COPD (chronic obstructive pulmonary disease) (HCC)    MILD, NO INHALERS USED   DEGENERATIVE JOINT DISEASE, KNEE    Family history of anesthesia complication    NEPHEW NAUSEA/VOMITING   GENU VALGUM    Hyperlipidemia    Hypertension    Numbness of leg 2011   just left shin   OTHER ACQUIRED DEFORMITY OF ANKLE AND FOOT OTHER    Pneumonia 2013   X 2   PVC's (premature ventricular contractions)    ROTATOR CUFF SYNDROME, LEFT    CANNOT LIFT LEFT ARM ALL THE WAY UP   UNEQUAL LEG LENGTH    UTI (urinary tract infection)    STARTED AUGMENTIN  ON 09-10-13    Past Surgical History:  Procedure Laterality Date   APPENDECTOMY  AGE 52   BREAST LUMPECTOMY Left 1991   radiation   BREAST SURGERY Left 1993 OR 1994   LUMPECTOMY AND RADIATION DONE   JOINT REPLACEMENT     NASAL SINUS SURGERY  2006   REVERSE  SHOULDER ARTHROPLASTY Left 07/31/2015   Procedure: LEFT REVERSE SHOULDER ARTHROPLASTY;  Surgeon: Netta Cedars, MD;  Location: [place];  Service: Orthopedics;  Laterality: Left;   TOTAL HIP ARTHROPLASTY Bilateral LEFT 2008 AND RIGHT 2010   TOTAL KNEE ARTHROPLASTY Left 09/17/2013   Procedure: LEFT TOTAL KNEE ARTHROPLASTY;  Surgeon: Mauri Pole, MD;  Location: [facility];  Service: Orthopedics;  Laterality: Left;    Current Medications: Current Meds  Medication Sig   Albuterol Sulfate (PROAIR RESPICLICK) 948 (90 Base) MCG/ACT AEPB Inhale 1 puff into the lungs every 6 (six) hours as needed (wheezing).   amoxicillin (AMOXIL) 500 MG capsule Take 2,000 mg by mouth See admin instructions. Pt takes prior to dental appointments   atorvastatin (LIPITOR) 20 MG tablet Take 20 mg by mouth every evening.   BIOTIN PO Take 1 tablet by mouth daily as needed (supplement).   Carbonyl Iron (IRON CHEWS PEDIATRIC) 15 MG CHEW Chew 1 capsule by mouth daily.   colestipol (COLESTID) 1 g tablet Take 1-2 g by mouth 3 (three) times a week.    diphenoxylate-atropine (LOMOTIL) 2.5-0.025 MG per tablet Take 1 tablet by mouth 4 (four) times daily as needed for diarrhea or loose stools.   escitalopram (LEXAPRO)  10 MG tablet Take 10 mg by mouth every morning.   esomeprazole (NEXIUM) 40 MG capsule 40 mg daily as needed.   eszopiclone (LUNESTA) 2 MG TABS tablet Take 1 mg by mouth at bedtime as needed for sleep.    Fluticasone-Salmeterol (ADVAIR) 250-50 MCG/DOSE AEPB Advair Diskus 250 mcg-50 mcg/dose powder for inhalation   hydrocortisone (ANUSOL-HC) 25 MG suppository Place 25 mg rectally as needed.   ibandronate (BONIVA) 150 MG tablet Take 150 mg by mouth daily.   lisinopril (ZESTRIL) 5 MG tablet TAKE 1 TABLET BY MOUTH DAILY, YOU MAY TAKE 1 EXTRA TABLET DAILY AS NEEDED FOR HIGH BLOOD PRESSURE   LORazepam (ATIVAN) 0.5 MG tablet Take 0.25 mg by mouth every 8 (eight) hours as needed for anxiety.   metoprolol succinate (TOPROL-XL) 50 MG 24  hr tablet TAKE 1 TABLET DAILY (TAKE WITH OR IMMEDIATELY FOLLOWING A MEAL)   Probiotic Product (ALIGN) 4 MG CAPS Take 1 capsule by mouth daily.   Propylene Glycol-Glycerin 0.6-0.6 % SOLN Apply to eye as needed.   Respiratory Therapy Supplies (FLUTTER) DEVI Use as directed   Vitamin D, Ergocalciferol, (DRISDOL) 50000 UNITS CAPS capsule Take 50,000 Units by mouth 2 (two) times a week. Mondays   [DISCONTINUED] lisinopril (ZESTRIL) 5 MG tablet TAKE 1 TABLET(5 MG) BY MOUTH IN THE MORNING AND AT BEDTIME     Allergies:   Albuterol, Diltiazem, Naproxen, Nitrofurantoin, Other, and Cardizem [diltiazem hcl]   Social History   Socioeconomic History   Marital status: Widowed    Spouse name: Not on file   Number of children: Not on file   Years of education: Not on file   Highest education level: Not on file  Occupational History   Not on file  Tobacco Use   Smoking status: Former    Packs/day: 0.25    Years: 15.00    Pack years: 3.75    Types: Cigarettes    Quit date: 01/18/1992    Years since quitting: 29.1    Passive exposure: Never   Smokeless tobacco: Never  Substance and Sexual Activity   Alcohol use: Yes    Comment: WINE 2 GLASSES PER DAY   Drug use: No   Sexual activity: Not on file  Other Topics Concern   Not on file  Social History Narrative   Not on file   Social Determinants of Health   Financial Resource Strain: Not on file  Food Insecurity: Not on file  Transportation Needs: Not on file  Physical Activity: Not on file  Stress: Not on file  Social Connections: Not on file     Family History:  The patient's  family history includes Diabetes in her mother; Heart Problems in her father; Hypertension in her brother.   ROS:   Please see the history of present illness.    ROS All other systems reviewed and are negative.   PHYSICAL EXAM:   VS:  BP 128/76    Pulse 67    Ht 5' 4.5" (1.638 m)    Wt 141 lb 12.8 oz (64.3 kg)    SpO2 97%    BMI 23.96 kg/m   Physical Exam   GEN: Thin, looks younger than stated age, in no acute distress  Neck: no JVD, carotid bruits, or masses Cardiac:RRR; no murmurs, rubs, or gallops  Respiratory:  clear to auscultation bilaterally, normal work of breathing GI: soft, nontender, nondistended, + BS Ext: without cyanosis, clubbing, or edema, Good distal pulses bilaterally Neuro:  Alert and Oriented x 3  Psych: euthymic mood, full affect  Wt Readings from Last 3 Encounters:  03/17/21 141 lb 12.8 oz (64.3 kg)  03/03/20 138 lb (62.6 kg)  11/14/19 145 lb 3.2 oz (65.9 kg)      Studies/Labs Reviewed:   EKG:  EKG is  ordered today.  The ekg ordered today demonstrates NSR nonspecific ST changes, no acute change  Recent Labs: No results found for requested labs within last 8760 hours.   Lipid Panel No results found for: CHOL, TRIG, HDL, CHOLHDL, VLDL, LDLCALC, LDLDIRECT  Additional studies/ records that were reviewed today include:  NST 02/03/10 Low risk   Risk Assessment/Calculations:         ASSESSMENT:    1. Essential hypertension   2. PVC's (premature ventricular contractions)   3. Other hyperlipidemia      PLAN:  In order of problems listed above:  HTN BP controlled today-elevated yesterday when anxious. Can take an extra lisinopril prn. Check bmet/cbc/tsh today 2 gm sodium diet  PVC's suppressed with Toprol  HLD managed by PCP LDL 85 09/2020  Shared Decision Making/Informed Consent        Medication Adjustments/Labs and Tests Ordered: Current medicines are reviewed at length with the patient today.  Concerns regarding medicines are outlined above.  Medication changes, Labs and Tests ordered today are listed in the Patient Instructions below. Patient Instructions  Medication Instructions:  Your physician recommends that you continue on your current medications as directed, YOU CAN TAKE A EXTRA LISINOPRIL AS NEEDED FOR HIGH BLOOD PRESSURE.   Please refer to the Current Medication list given to you  today.  *If you need a refill on your cardiac medications before your next appointment, please call your pharmacy*   Lab Work: TODAY:  BMET, CBC, & TSH    If you have labs (blood work) drawn today and your tests are completely normal, you will receive your results only by: [place] (if you have MyChart) OR A paper copy in the mail If you have any lab test that is abnormal or we need to change your treatment, we will call you to review the results.   Testing/Procedures: None ordered   Follow-Up: At [facility], you and your health needs are our priority.  As part of our continuing mission to provide you with exceptional heart care, we have created designated Provider Care Teams.  These Care Teams include your primary Cardiologist (physician) and Advanced Practice Providers (APPs -  Physician Assistants and Nurse Practitioners) who all work together to provide you with the care you need, when you need it.  We recommend signing up for the patient portal called "MyChart".  Sign up information is provided on this After Visit Summary.  MyChart is used to connect with patients for Virtual Visits (Telemedicine).  Patients are able to view lab/test results, encounter notes, upcoming appointments, etc.  Non-urgent messages can be sent to your provider as well.   To learn more about what you can do with MyChart, go to [email or web address].    Your next appointment:   12 month(s)  The format for your next appointment:   In Person  Provider:   Fransico Him, MD    Other Instructions    Signed, Ermalinda Barrios, PA-C  03/17/2021 8:41 AM    [address], [place], [place]  [address] Phone: [phone]; Fax: [phone]

## 2021-03-17 NOTE — Patient Instructions (Addendum)
Medication Instructions:  ?Your physician recommends that you continue on your current medications as directed, YOU CAN TAKE A EXTRA LISINOPRIL AS NEEDED FOR HIGH BLOOD PRESSURE.   Please refer to the Current Medication list given to you today. ? ?*If you need a refill on your cardiac medications before your next appointment, please call your pharmacy* ? ? ?Lab Work: ?TODAY:  BMET, CBC, & TSH  ? ? ?If you have labs (blood work) drawn today and your tests are completely normal, you will receive your results only by: ?MyChart Message (if you have MyChart) OR ?A paper copy in the mail ?If you have any lab test that is abnormal or we need to change your treatment, we will call you to review the results. ? ? ?Testing/Procedures: ?None ordered ? ? ?Follow-Up: ?At [facility], you and your health needs are our priority.  As part of our continuing mission to provide you with exceptional heart care, we have created designated Provider Care Teams.  These Care Teams include your primary Cardiologist (physician) and Advanced Practice Providers (APPs -  Physician Assistants and Nurse Practitioners) who all work together to provide you with the care you need, when you need it. ? ?We recommend signing up for the patient portal called "MyChart".  Sign up information is provided on this After Visit Summary.  MyChart is used to connect with patients for Virtual Visits (Telemedicine).  Patients are able to view lab/test results, encounter notes, upcoming appointments, etc.  Non-urgent messages can be sent to your provider as well.   ?To learn more about what you can do with MyChart, go to [email or web address].   ? ?Your next appointment:   ?12 month(s) ? ?The format for your next appointment:   ?In Person ? ?Provider:   ?Fransico Him, MD  ? ? ?Other Instructions ? ?

## 2021-03-18 NOTE — Telephone Encounter (Signed)
Addressed at appointment with Estella Husk, PA-C on 3/1.  ?

## 2021-03-31 ENCOUNTER — Inpatient Hospital Stay: Admission: RE | Admit: 2021-03-31 | Payer: Medicare HMO | Source: Ambulatory Visit

## 2021-04-01 ENCOUNTER — Other Ambulatory Visit: Payer: Self-pay | Admitting: Cardiology

## 2021-04-01 DIAGNOSIS — I10 Essential (primary) hypertension: Secondary | ICD-10-CM | POA: Diagnosis not present

## 2021-04-01 DIAGNOSIS — R413 Other amnesia: Secondary | ICD-10-CM | POA: Diagnosis not present

## 2021-04-01 DIAGNOSIS — E538 Deficiency of other specified B group vitamins: Secondary | ICD-10-CM | POA: Diagnosis not present

## 2021-04-01 DIAGNOSIS — J449 Chronic obstructive pulmonary disease, unspecified: Secondary | ICD-10-CM | POA: Diagnosis not present

## 2021-04-01 DIAGNOSIS — J479 Bronchiectasis, uncomplicated: Secondary | ICD-10-CM | POA: Diagnosis not present

## 2021-04-01 DIAGNOSIS — E785 Hyperlipidemia, unspecified: Secondary | ICD-10-CM | POA: Diagnosis not present

## 2021-04-29 DIAGNOSIS — E538 Deficiency of other specified B group vitamins: Secondary | ICD-10-CM | POA: Diagnosis not present

## 2021-05-04 ENCOUNTER — Ambulatory Visit: Payer: Medicare HMO | Admitting: Physician Assistant

## 2021-06-03 DIAGNOSIS — E538 Deficiency of other specified B group vitamins: Secondary | ICD-10-CM | POA: Diagnosis not present

## 2021-06-30 DIAGNOSIS — R300 Dysuria: Secondary | ICD-10-CM | POA: Diagnosis not present

## 2021-06-30 DIAGNOSIS — N390 Urinary tract infection, site not specified: Secondary | ICD-10-CM | POA: Diagnosis not present

## 2021-06-30 DIAGNOSIS — R051 Acute cough: Secondary | ICD-10-CM | POA: Diagnosis not present

## 2021-06-30 DIAGNOSIS — J189 Pneumonia, unspecified organism: Secondary | ICD-10-CM | POA: Diagnosis not present

## 2021-06-30 DIAGNOSIS — D649 Anemia, unspecified: Secondary | ICD-10-CM | POA: Diagnosis not present

## 2021-06-30 DIAGNOSIS — D72825 Bandemia: Secondary | ICD-10-CM | POA: Diagnosis not present

## 2021-06-30 DIAGNOSIS — R000 Tachycardia, unspecified: Secondary | ICD-10-CM | POA: Diagnosis not present

## 2021-06-30 DIAGNOSIS — I10 Essential (primary) hypertension: Secondary | ICD-10-CM | POA: Diagnosis not present

## 2021-07-02 DIAGNOSIS — J189 Pneumonia, unspecified organism: Secondary | ICD-10-CM | POA: Diagnosis not present

## 2021-07-02 DIAGNOSIS — D72825 Bandemia: Secondary | ICD-10-CM | POA: Diagnosis not present

## 2021-07-02 DIAGNOSIS — I10 Essential (primary) hypertension: Secondary | ICD-10-CM | POA: Diagnosis not present

## 2021-07-02 DIAGNOSIS — D649 Anemia, unspecified: Secondary | ICD-10-CM | POA: Diagnosis not present

## 2021-07-02 DIAGNOSIS — N390 Urinary tract infection, site not specified: Secondary | ICD-10-CM | POA: Diagnosis not present

## 2021-07-02 DIAGNOSIS — R000 Tachycardia, unspecified: Secondary | ICD-10-CM | POA: Diagnosis not present

## 2021-07-29 DIAGNOSIS — E538 Deficiency of other specified B group vitamins: Secondary | ICD-10-CM | POA: Diagnosis not present

## 2021-09-08 IMAGING — US US ABDOMEN COMPLETE
2 series · 13 of 25 positions shown · non-contrast
Comparison: None.

CLINICAL DATA: Left upper quadrant abdominal pain.

EXAM:
ABDOMEN ULTRASOUND COMPLETE

[Series 1: us abdomen complete · 0.15mm/px · 12 of 94 slices shown (1 of 2)]
[im 1/94]
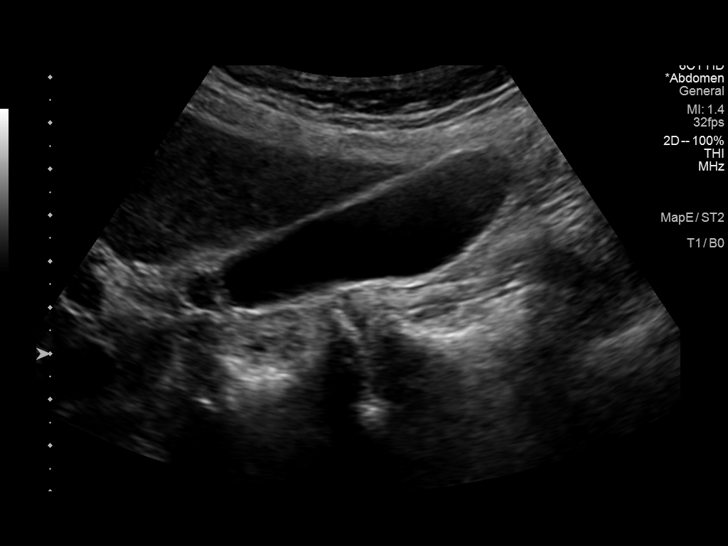
[im 9/94]
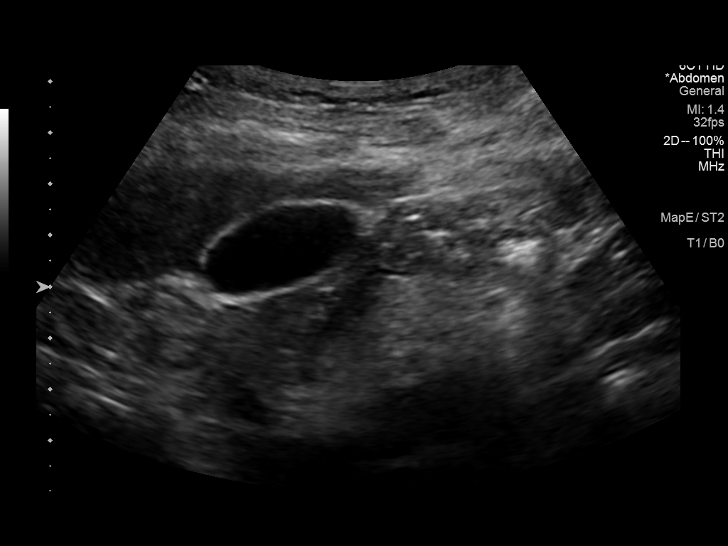
[im 17/94]
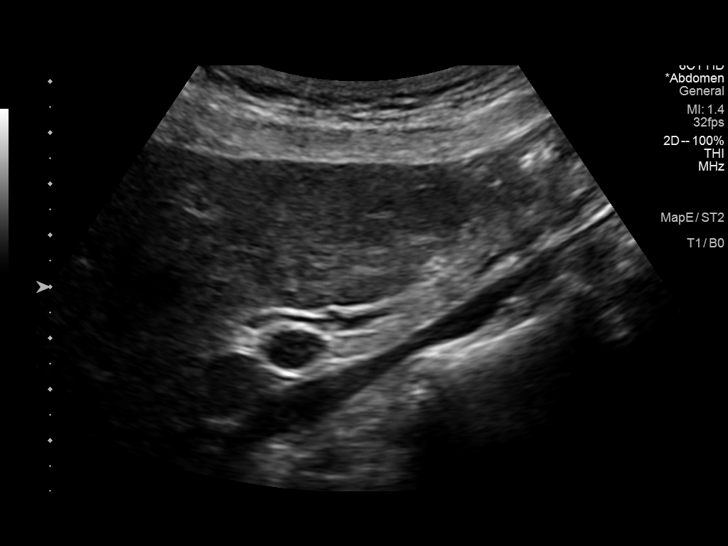
[im 25/94]
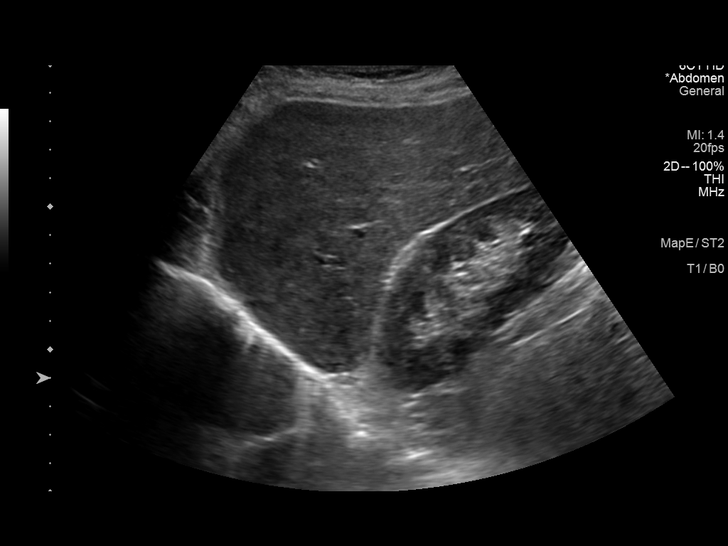
[im 33/94]
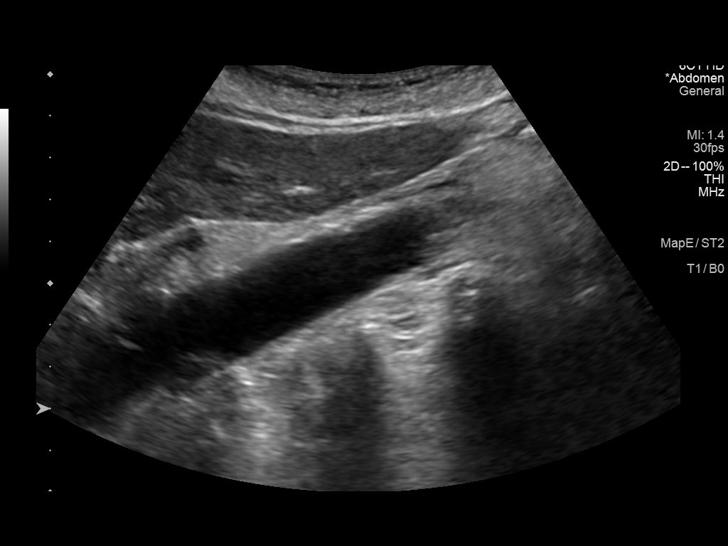
[im 41/94]
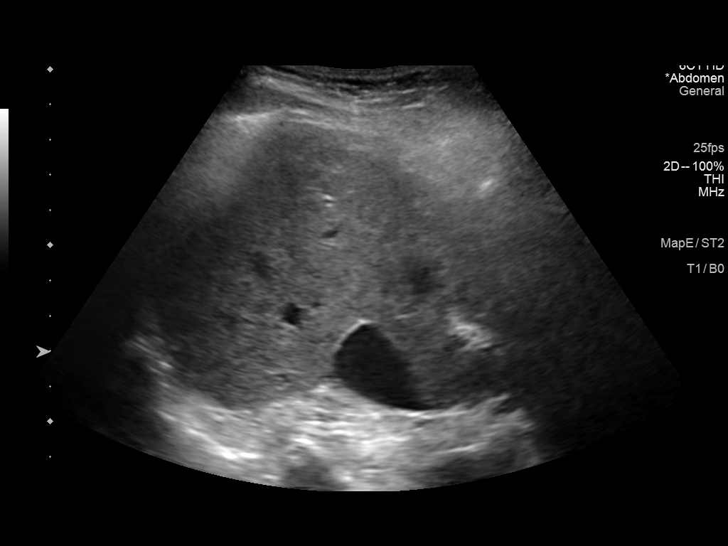
[im 49/94]
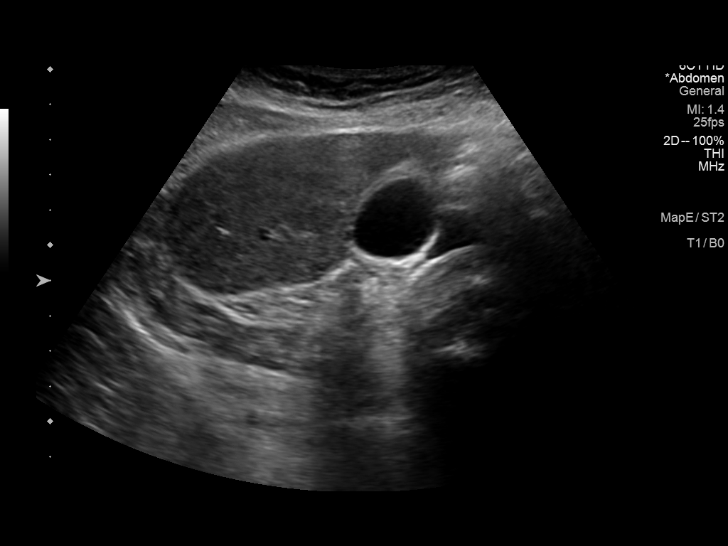
[im 57/94]
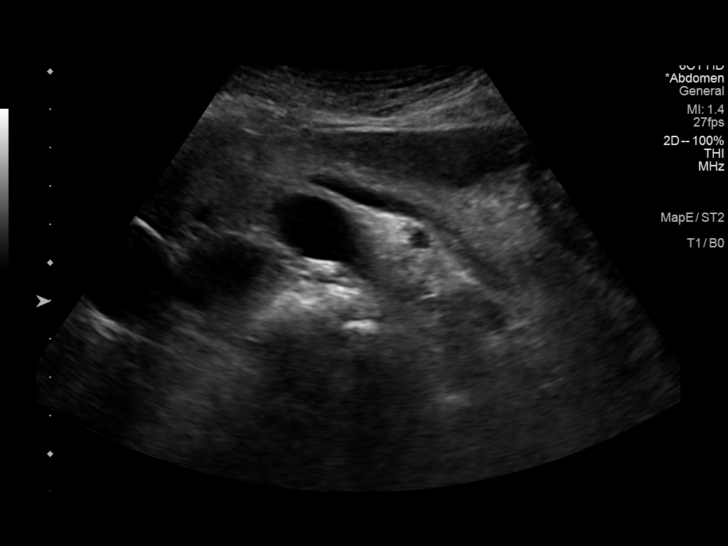
[im 65/94]
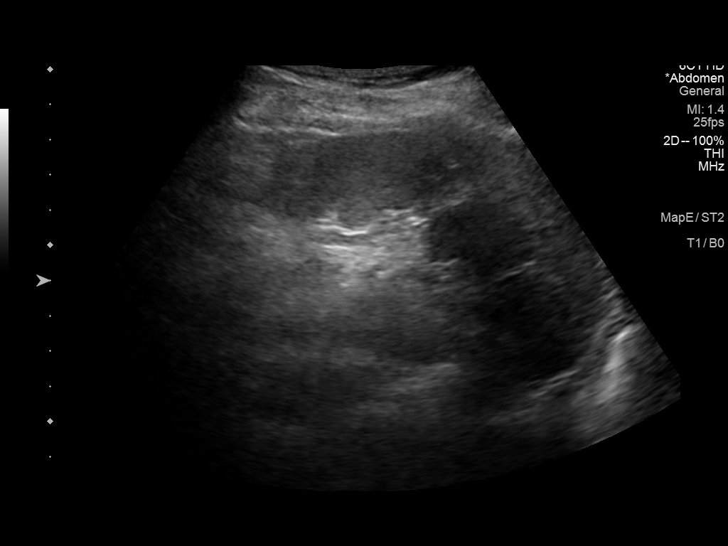
[im 73/94]
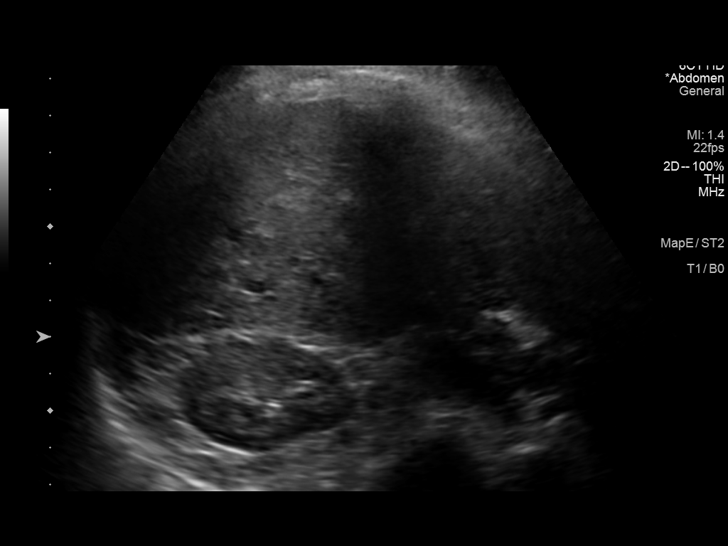
[im 81/94]
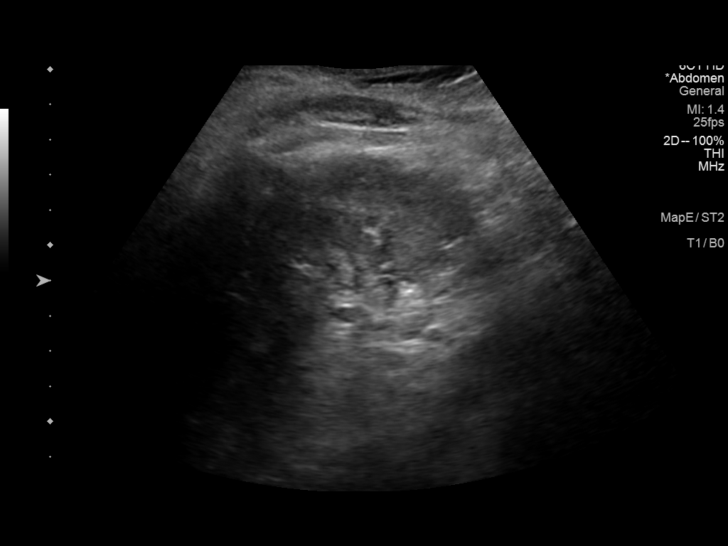
[im 89/94]
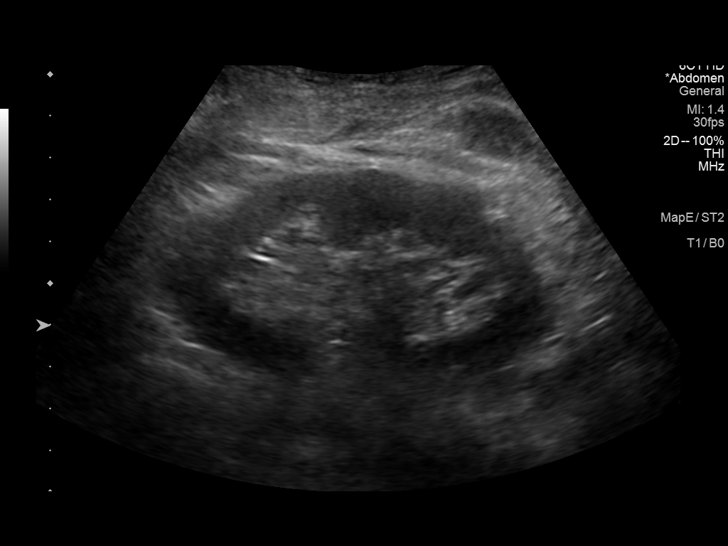

[Series 2001: us abdomen complete · 0.14mm/px · 1 of 1 slices shown (2 of 2)]
[im 1/1]
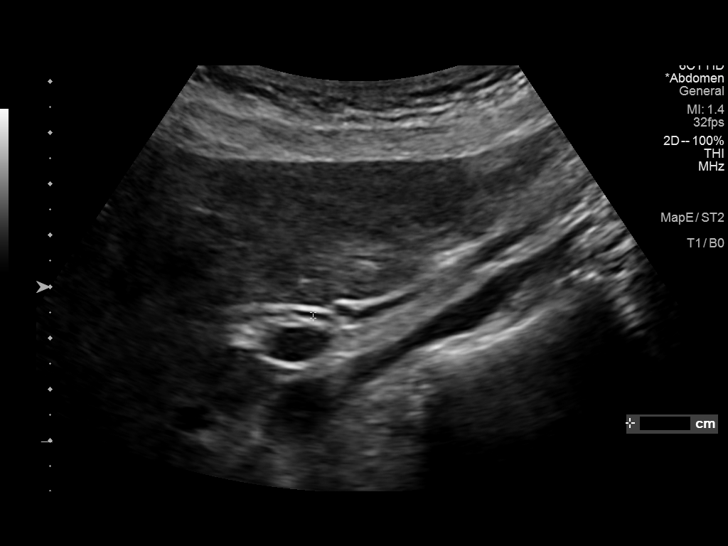

[13 of 25 positions shown; findings below may reference images not displayed]

FINDINGS: Gallbladder: Physiologically distended. No gallstones or wall
thickening visualized. No sonographic Murphy sign noted by
sonographer.

Common bile duct: Diameter: 1-2 mm, normal

Liver: No focal lesion identified. Mildly increased and
heterogeneous in parenchymal echogenicity. Equivocal capsular
lobulations. Portal vein is patent on color Doppler imaging with
normal direction of blood flow towards the liver.

IVC: No abnormality visualized.

Pancreas: Visualized portion unremarkable.

Spleen: Size and appearance within normal limits.

Right Kidney: Length: 9.7 cm. Echogenicity within normal limits. No
mass or hydronephrosis visualized.

Left Kidney: Length: 9.1 cm. Echogenicity within normal limits. No
mass or hydronephrosis visualized.

Abdominal aorta: No aneurysm visualized. Atherosclerotic
calcifications noted distally.

Other findings: No ascites or free fluid.
IMPRESSION: 1. No explanation for left upper quadrant pain.
2. Slight increased hepatic echogenicity typically due to hepatic
steatosis. Equivocal capsular nodularity which may can be seen with
early cirrhosis. Recommend correlation with LFTs and cirrhosis risk
factors.
3. Atherosclerotic nonaneurysmal abdominal aorta.

## 2021-09-14 DIAGNOSIS — E538 Deficiency of other specified B group vitamins: Secondary | ICD-10-CM | POA: Diagnosis not present

## 2021-10-04 DIAGNOSIS — F419 Anxiety disorder, unspecified: Secondary | ICD-10-CM | POA: Diagnosis not present

## 2021-10-04 DIAGNOSIS — R82998 Other abnormal findings in urine: Secondary | ICD-10-CM | POA: Diagnosis not present

## 2021-10-04 DIAGNOSIS — M8589 Other specified disorders of bone density and structure, multiple sites: Secondary | ICD-10-CM | POA: Diagnosis not present

## 2021-10-04 DIAGNOSIS — D649 Anemia, unspecified: Secondary | ICD-10-CM | POA: Diagnosis not present

## 2021-10-04 DIAGNOSIS — R69 Illness, unspecified: Secondary | ICD-10-CM | POA: Diagnosis not present

## 2021-10-04 DIAGNOSIS — E785 Hyperlipidemia, unspecified: Secondary | ICD-10-CM | POA: Diagnosis not present

## 2021-10-04 DIAGNOSIS — E559 Vitamin D deficiency, unspecified: Secondary | ICD-10-CM | POA: Diagnosis not present

## 2021-10-04 DIAGNOSIS — I10 Essential (primary) hypertension: Secondary | ICD-10-CM | POA: Diagnosis not present

## 2021-10-04 DIAGNOSIS — E538 Deficiency of other specified B group vitamins: Secondary | ICD-10-CM | POA: Diagnosis not present

## 2021-10-04 DIAGNOSIS — R7989 Other specified abnormal findings of blood chemistry: Secondary | ICD-10-CM | POA: Diagnosis not present

## 2021-10-12 DIAGNOSIS — R011 Cardiac murmur, unspecified: Secondary | ICD-10-CM | POA: Diagnosis not present

## 2021-10-12 DIAGNOSIS — I10 Essential (primary) hypertension: Secondary | ICD-10-CM | POA: Diagnosis not present

## 2021-10-12 DIAGNOSIS — K5730 Diverticulosis of large intestine without perforation or abscess without bleeding: Secondary | ICD-10-CM | POA: Diagnosis not present

## 2021-10-12 DIAGNOSIS — R82998 Other abnormal findings in urine: Secondary | ICD-10-CM | POA: Diagnosis not present

## 2021-10-12 DIAGNOSIS — R413 Other amnesia: Secondary | ICD-10-CM | POA: Diagnosis not present

## 2021-10-12 DIAGNOSIS — J449 Chronic obstructive pulmonary disease, unspecified: Secondary | ICD-10-CM | POA: Diagnosis not present

## 2021-10-12 DIAGNOSIS — E785 Hyperlipidemia, unspecified: Secondary | ICD-10-CM | POA: Diagnosis not present

## 2021-10-12 DIAGNOSIS — Z0000 Encounter for general adult medical examination without abnormal findings: Secondary | ICD-10-CM | POA: Diagnosis not present

## 2021-10-12 DIAGNOSIS — Z1339 Encounter for screening examination for other mental health and behavioral disorders: Secondary | ICD-10-CM | POA: Diagnosis not present

## 2021-10-12 DIAGNOSIS — J479 Bronchiectasis, uncomplicated: Secondary | ICD-10-CM | POA: Diagnosis not present

## 2021-10-12 DIAGNOSIS — M8580 Other specified disorders of bone density and structure, unspecified site: Secondary | ICD-10-CM | POA: Diagnosis not present

## 2021-10-12 DIAGNOSIS — E538 Deficiency of other specified B group vitamins: Secondary | ICD-10-CM | POA: Diagnosis not present

## 2021-10-12 DIAGNOSIS — Z1331 Encounter for screening for depression: Secondary | ICD-10-CM | POA: Diagnosis not present

## 2021-10-12 DIAGNOSIS — I5189 Other ill-defined heart diseases: Secondary | ICD-10-CM | POA: Diagnosis not present

## 2021-10-12 DIAGNOSIS — E559 Vitamin D deficiency, unspecified: Secondary | ICD-10-CM | POA: Diagnosis not present

## 2021-10-16 DIAGNOSIS — M8580 Other specified disorders of bone density and structure, unspecified site: Secondary | ICD-10-CM | POA: Diagnosis not present

## 2021-10-16 DIAGNOSIS — Z803 Family history of malignant neoplasm of breast: Secondary | ICD-10-CM | POA: Diagnosis not present

## 2021-10-16 DIAGNOSIS — I10 Essential (primary) hypertension: Secondary | ICD-10-CM | POA: Diagnosis not present

## 2021-10-16 DIAGNOSIS — M1990 Unspecified osteoarthritis, unspecified site: Secondary | ICD-10-CM | POA: Diagnosis not present

## 2021-10-16 DIAGNOSIS — R69 Illness, unspecified: Secondary | ICD-10-CM | POA: Diagnosis not present

## 2021-10-16 DIAGNOSIS — Z8249 Family history of ischemic heart disease and other diseases of the circulatory system: Secondary | ICD-10-CM | POA: Diagnosis not present

## 2021-10-16 DIAGNOSIS — R32 Unspecified urinary incontinence: Secondary | ICD-10-CM | POA: Diagnosis not present

## 2021-10-16 DIAGNOSIS — J449 Chronic obstructive pulmonary disease, unspecified: Secondary | ICD-10-CM | POA: Diagnosis not present

## 2021-10-16 DIAGNOSIS — G3184 Mild cognitive impairment, so stated: Secondary | ICD-10-CM | POA: Diagnosis not present

## 2021-10-16 DIAGNOSIS — Z853 Personal history of malignant neoplasm of breast: Secondary | ICD-10-CM | POA: Diagnosis not present

## 2021-10-16 DIAGNOSIS — Z87440 Personal history of urinary (tract) infections: Secondary | ICD-10-CM | POA: Diagnosis not present

## 2021-10-16 DIAGNOSIS — F324 Major depressive disorder, single episode, in partial remission: Secondary | ICD-10-CM | POA: Diagnosis not present

## 2021-10-25 DIAGNOSIS — L538 Other specified erythematous conditions: Secondary | ICD-10-CM | POA: Diagnosis not present

## 2021-10-25 DIAGNOSIS — Z789 Other specified health status: Secondary | ICD-10-CM | POA: Diagnosis not present

## 2021-10-25 DIAGNOSIS — R208 Other disturbances of skin sensation: Secondary | ICD-10-CM | POA: Diagnosis not present

## 2021-10-25 DIAGNOSIS — L298 Other pruritus: Secondary | ICD-10-CM | POA: Diagnosis not present

## 2021-10-25 DIAGNOSIS — L820 Inflamed seborrheic keratosis: Secondary | ICD-10-CM | POA: Diagnosis not present

## 2021-10-26 DIAGNOSIS — E538 Deficiency of other specified B group vitamins: Secondary | ICD-10-CM | POA: Diagnosis not present

## 2021-10-26 DIAGNOSIS — R7989 Other specified abnormal findings of blood chemistry: Secondary | ICD-10-CM | POA: Diagnosis not present

## 2021-10-26 DIAGNOSIS — I10 Essential (primary) hypertension: Secondary | ICD-10-CM | POA: Diagnosis not present

## 2021-10-26 DIAGNOSIS — D649 Anemia, unspecified: Secondary | ICD-10-CM | POA: Diagnosis not present

## 2021-10-26 DIAGNOSIS — E785 Hyperlipidemia, unspecified: Secondary | ICD-10-CM | POA: Diagnosis not present

## 2021-10-26 DIAGNOSIS — E559 Vitamin D deficiency, unspecified: Secondary | ICD-10-CM | POA: Diagnosis not present

## 2021-11-06 ENCOUNTER — Emergency Department (HOSPITAL_BASED_OUTPATIENT_CLINIC_OR_DEPARTMENT_OTHER)
Admission: EM | Admit: 2021-11-06 | Discharge: 2021-11-06 | Discharge status: Against Medical Advice | Payer: Medicare HMO

## 2021-11-06 ENCOUNTER — Other Ambulatory Visit: Payer: Self-pay

## 2021-11-06 NOTE — ED Notes (Addendum)
Pt was not in lobby when NT called her name for EKG. Registration reports they left the department right after they arrived without notifying staff.

## 2021-11-13 DIAGNOSIS — Z23 Encounter for immunization: Secondary | ICD-10-CM | POA: Diagnosis not present

## 2021-11-26 DIAGNOSIS — I10 Essential (primary) hypertension: Secondary | ICD-10-CM | POA: Diagnosis not present

## 2021-11-26 DIAGNOSIS — R2681 Unsteadiness on feet: Secondary | ICD-10-CM | POA: Diagnosis not present

## 2021-11-26 DIAGNOSIS — J449 Chronic obstructive pulmonary disease, unspecified: Secondary | ICD-10-CM | POA: Diagnosis not present

## 2021-11-26 DIAGNOSIS — E785 Hyperlipidemia, unspecified: Secondary | ICD-10-CM | POA: Diagnosis not present

## 2021-11-26 DIAGNOSIS — R413 Other amnesia: Secondary | ICD-10-CM | POA: Diagnosis not present

## 2021-11-26 DIAGNOSIS — E538 Deficiency of other specified B group vitamins: Secondary | ICD-10-CM | POA: Diagnosis not present

## 2021-11-26 DIAGNOSIS — R69 Illness, unspecified: Secondary | ICD-10-CM | POA: Diagnosis not present

## 2021-12-14 DIAGNOSIS — C44329 Squamous cell carcinoma of skin of other parts of face: Secondary | ICD-10-CM | POA: Diagnosis not present

## 2021-12-14 DIAGNOSIS — D485 Neoplasm of uncertain behavior of skin: Secondary | ICD-10-CM | POA: Diagnosis not present

## 2021-12-19 NOTE — Progress Notes (Unsigned)
Office Visit    Patient Name: [name] [name] Date of Encounter: 12/20/2021  Primary Care Provider:  Crist Infante, MD Primary Cardiologist:  Fransico Him, MD Primary Electrophysiologist: None  Chief Complaint    AI SONNENFELD is a 88 y.o. female with PMH of PVCs, HLD, COPD, asymptomatic bradycardia, HTN, breast CA who presents today for follow-up of hypertension and atrial fibrillation.  Past Medical History    Past Medical History:  Diagnosis Date   Anxiety    Bradycardia    asymptomatic   Breast CA ([place]) 1993 OR 1994   LEFT, SURGERY AND RADIATION DONE   Breast cancer ([place])    COPD (chronic obstructive pulmonary disease) (HCC)    MILD, NO INHALERS USED   DEGENERATIVE JOINT DISEASE, KNEE    Family history of anesthesia complication    NEPHEW NAUSEA/VOMITING   GENU VALGUM    Hyperlipidemia    Hypertension    Numbness of leg 2011   just left shin   OTHER ACQUIRED DEFORMITY OF ANKLE AND FOOT OTHER    Pneumonia 2013   X 2   PVC's (premature ventricular contractions)    ROTATOR CUFF SYNDROME, LEFT    CANNOT LIFT LEFT ARM ALL THE WAY UP   UNEQUAL LEG LENGTH    UTI (urinary tract infection)    STARTED AUGMENTIN  ON 09-10-13   Past Surgical History:  Procedure Laterality Date   APPENDECTOMY  AGE 87   BREAST LUMPECTOMY Left 1991   radiation   BREAST SURGERY Left 1993 OR 1994   LUMPECTOMY AND RADIATION DONE   JOINT REPLACEMENT     NASAL SINUS SURGERY  2006   REVERSE SHOULDER ARTHROPLASTY Left 07/31/2015   Procedure: LEFT REVERSE SHOULDER ARTHROPLASTY;  Surgeon: Netta Cedars, MD;  Location: [facility];  Service: Orthopedics;  Laterality: Left;   TOTAL HIP ARTHROPLASTY Bilateral LEFT 2008 AND RIGHT 2010   TOTAL KNEE ARTHROPLASTY Left 09/17/2013   Procedure: LEFT TOTAL KNEE ARTHROPLASTY;  Surgeon: Mauri Pole, MD;  Location: [facility];  Service: Orthopedics;  Laterality: Left;    Allergies  Allergies  Allergen Reactions   Albuterol     Other reaction(s): had some  rapid heartbeat in 2013 when she took it for some bronchitis.   Diltiazem     Other reaction(s): presyncope, bp went a bit low for bp on 180 mg dose   Naproxen     Other reaction(s): swelling   Nitrofurantoin     Other reaction(s): rash 9/18   Other     PAIN MEDICATION CAUSES NAUSEA--? name   Cardizem [Diltiazem Hcl] Other (See Comments)    hypotension    History of Present Illness    [name] [name]  is a 88 year old female with the above mention past medical history who presents today for complaint of atrial fibrillation and hypertension.  Ms. Geanie Kenning has been followed by Dr. Radford Pax since 2012 when she was seen for complaint of PVCs and mild dyspnea.  She had a nuclear stress test that showed a small defect in the anterior apical region that was felt to be breast attenuation artifact.  She underwent LHC for further stratification that revealed widely patent coronary arteries and EF of 55% with mild elevated LV end-diastolic pressure.  She has a history of palpitations that are controlled currently with Toprol-XL.  She began having issues with her blood pressure in 2020 and began tracking with systolics running as high as 170s.  She endorsed 1 alcoholic beverage  nightly and denied excess salt in her diet.  Amlodipine was changed to 5 mg twice daily with instruction to monitor blood pressures.  She was last seen by Dr. Radford Pax in 02/2020 and was tolerating medications with no adverse reactions.  She continued to have suppressed PVCs and blood pressure was adequately controlled with both Toprol and lisinopril.  She was seen 03/2021 and noted that her blood pressure goes up when nervous or anxious.  She was advised to take extra 5 mg of lisinopril for elevated BP.  Ms. Sela Hilding presents today with her daughter for follow-up of of tachycardia.  Since last being seen in the office patient reports she has been doing well with exception of bouts of tachycardia that she feels are associated with possible  anxiety.  She has recently moved to [place] [facility] home and has experienced 3 episodes of tachycardia that last for indeterminate amount of times.  Her blood pressure today was well-controlled at 120/68 and heart rate was 56 bpm.  She reports compliance with her current medication regimen and denies any adverse reactions.  During today's visit we discussed the pathophysiology of atrial fibrillation and SVT.  We also discussed Valsalva maneuver and how to initiate to stop her tachycardia. Patient denies chest pain, palpitations, dyspnea, PND, orthopnea, nausea, vomiting, dizziness, syncope, edema, weight gain, or early satiety.  Home Medications    Current Outpatient Medications  Medication Sig Dispense Refill   atorvastatin (LIPITOR) 20 MG tablet Take 1 tablet by mouth daily.     Cyanocobalamin (VITAMIN B12) 1000 MCG TBCR 1,000 mg daily.     diphenoxylate-atropine (LOMOTIL) 2.5-0.025 MG per tablet Take 1 tablet by mouth 4 (four) times daily as needed for diarrhea or loose stools. 120 tablet 0   escitalopram (LEXAPRO) 10 MG tablet Take 10 mg by mouth every morning.     esomeprazole (NEXIUM) 40 MG capsule 40 mg daily as needed.     lisinopril (ZESTRIL) 5 MG tablet TAKE 1 TABLET BY MOUTH DAILY, YOU MAY TAKE 1 EXTRA TABLET DAILY AS NEEDED FOR HIGH BLOOD PRESSURE 100 tablet 3   metoprolol succinate (TOPROL-XL) 50 MG 24 hr tablet Take 1 tablet (50 mg total) by mouth daily. Take with or immediately following a meal. Can take an additional tab as needed for heartrate over 100 or for palpitations 90 tablet 0   Probiotic Product (ALIGN) 4 MG CAPS Take 1 capsule by mouth daily.     No current facility-administered medications for this visit.     Review of Systems  Please see the history of present illness.    (+) Tachycardia (+) Anxiety  All other systems reviewed and are otherwise negative except as noted above.  Physical Exam    Wt Readings from Last 3 Encounters:  12/20/21 136 lb (61.7 kg)   03/17/21 141 lb 12.8 oz (64.3 kg)  03/03/20 138 lb (62.6 kg)   VS: Vitals:   12/20/21 1550  BP: 120/68  Pulse: (!) 56  SpO2: 94%  ,Body mass index is 23.34 kg/m.  Constitutional:      Appearance: Healthy appearance. Not in distress.  Neck:     Vascular: JVD normal.  Pulmonary:     Effort: Pulmonary effort is normal.     Breath sounds: No wheezing. No rales. Diminished in the bases Cardiovascular:     Normal rate. Regular rhythm. Normal S1. Normal S2.      Murmurs: There is no murmur.  Edema:    Peripheral edema absent.  Abdominal:  Palpations: Abdomen is soft non tender. There is no hepatomegaly.  Skin:    General: Skin is warm and dry.  Neurological:     General: No focal deficit present.     Mental Status: Alert and oriented to person, place and time.     Cranial Nerves: Cranial nerves are intact.   EKG/LABS/Other Studies Reviewed    ECG personally reviewed by me today -sinus bradycardia with right axis deviation and rate of 56 bpm with no acute changes noted consistent with previous EKG.  Lab Results  Component Value Date   WBC 7.1 03/17/2021   HGB 13.3 03/17/2021   HCT 40.0 03/17/2021   MCV 96 03/17/2021   PLT 238 03/17/2021   Lab Results  Component Value Date   CREATININE 1.01 (H) 03/17/2021   BUN 15 03/17/2021   NA 137 03/17/2021   K 4.5 03/17/2021   CL 102 03/17/2021   CO2 25 03/17/2021   Lab Results  Component Value Date   ALT 13 09/15/2006   AST 25 09/15/2006   ALKPHOS 89 09/15/2006   BILITOT 0.8 09/15/2006   No results found for: "CHOL", "HDL", "LDLCALC", "LDLDIRECT", "TRIG", "CHOLHDL"  No results found for: "HGBA1C"  Assessment & Plan    1.  Essential hypertension: -Patient's blood pressure today was 120/68 -Continue Toprol-XL 50 mg and lisinopril 5 mg daily  2.  History of PVCs: -Patient had complaint of tachycardia that occurs with anxiety and resolves spontaneously. -She is currently taking Toprol-XL 50 mg for PVC  suppression. -She will take Toprol-XL 25 mg as needed for tachycardia -14-day ZIO monitor to evaluate for possible SVT or atrial fibrillation  3.  Hyperlipidemia: -Patient's last LDL was 54 on 11/2021 -Continue Lipitor 20 mg  4.  COPD: -Currently managed by PCP  Disposition: Follow-up with Fransico Him, MD or APP in 6-8 weeks    Medication Adjustments/Labs and Tests Ordered: Current medicines are reviewed at length with the patient today.  Concerns regarding medicines are outlined above.   Signed, Mable Fill, Marissa Nestle, NP 12/20/2021, 5:33 PM [facility]  Note:  This document was prepared using Dragon voice recognition software and may include unintentional dictation errors.

## 2021-12-20 ENCOUNTER — Ambulatory Visit: Payer: Medicare HMO

## 2021-12-20 ENCOUNTER — Ambulatory Visit: Payer: Medicare HMO | Attending: Nurse Practitioner | Admitting: Nurse Practitioner

## 2021-12-20 ENCOUNTER — Encounter: Payer: Self-pay | Admitting: Nurse Practitioner

## 2021-12-20 VITALS — BP 120/68 | HR 56 | Ht 64.0 in | Wt 136.0 lb

## 2021-12-20 DIAGNOSIS — I10 Essential (primary) hypertension: Secondary | ICD-10-CM

## 2021-12-20 DIAGNOSIS — R000 Tachycardia, unspecified: Secondary | ICD-10-CM

## 2021-12-20 DIAGNOSIS — I493 Ventricular premature depolarization: Secondary | ICD-10-CM

## 2021-12-20 DIAGNOSIS — E785 Hyperlipidemia, unspecified: Secondary | ICD-10-CM

## 2021-12-20 DIAGNOSIS — Z8709 Personal history of other diseases of the respiratory system: Secondary | ICD-10-CM | POA: Diagnosis not present

## 2021-12-20 MED ORDER — METOPROLOL SUCCINATE ER 50 MG PO TB24: 50.0000 mg | ORAL_TABLET | Freq: Every day | ORAL | 0 refills | Status: DC

## 2021-12-20 NOTE — Patient Instructions (Signed)
Medication Instructions:  You can take an additional half tablet of Toprol as needed for heart rate 100 or for palpitations *If you need a refill on your cardiac medications before your next appointment, please call your pharmacy*   Lab Work:  None Ordered   Testing/Procedures: Bryn Gulling- Long Term Monitor Instructions  Your physician has requested you wear a ZIO patch monitor for 14 days.  This is a single patch monitor. [employer] supplies one patch monitor per enrollment. Additional stickers are not available. Please do not apply patch if you will be having a Nuclear Stress Test,  Echocardiogram, Cardiac CT, MRI, or Chest Xray during the period you would be wearing the  monitor. The patch cannot be worn during these tests. You cannot remove and re-apply the  ZIO XT patch monitor.  Your ZIO patch monitor will be mailed 3 day USPS to your address on file. It may take 3-5 days  to receive your monitor after you have been enrolled.  Once you have received your monitor, please review the enclosed instructions. Your monitor  has already been registered assigning a specific monitor serial # to you.  Billing and Patient Assistance Program Information  We have supplied [employer] with any of your insurance information on file for billing purposes. [employer] offers a sliding scale Patient Assistance Program for patients that do not have  insurance, or whose insurance does not completely cover the cost of the ZIO monitor.  You must apply for the Patient Assistance Program to qualify for this discounted rate.  To apply, please call [employer] at [phone], select option 4, select option 2, ask to apply for  Patient Assistance Program. Theodore Demark will ask your household income, and how many people  are in your household. They will quote your out-of-pocket cost based on that information.  [employer] will also be able to set up a 83-month interest-free payment plan if needed.  Applying the monitor   Shave  hair from upper left chest.  Hold abrader disc by orange tab. Rub abrader in 40 strokes over the upper left chest as  indicated in your monitor instructions.  Clean area with 4 enclosed alcohol pads. Let dry.  Apply patch as indicated in monitor instructions. Patch will be placed under collarbone on left  side of chest with arrow pointing upward.  Rub patch adhesive wings for 2 minutes. Remove white label marked "1". Remove the white  label marked "2". Rub patch adhesive wings for 2 additional minutes.  While looking in a mirror, press and release button in center of patch. A small green light will  flash 3-4 times. This will be your only indicator that the monitor has been turned on.  Do not shower for the first 24 hours. You may shower after the first 24 hours.  Press the button if you feel a symptom. You will hear a small click. Record Date, Time and  Symptom in the Patient Logbook.  When you are ready to remove the patch, follow instructions on the last 2 pages of Patient  Logbook. Stick patch monitor onto the last page of Patient Logbook.  Place Patient Logbook in the blue and white box. Use locking tab on box and tape box closed  securely. The blue and white box has prepaid postage on it. Please place it in the mailbox as  soon as possible. Your physician should have your test results approximately 7 days after the  monitor [facility]  Call [employer]  [facility] at [phone] if you have questions regarding  your ZIO XT patch monitor. Call them immediately if you see an orange light blinking on your  monitor.  If your monitor falls off in less than 4 days, contact our Monitor department at [phone].  If your monitor becomes loose or falls off after 4 days call [employer] at [phone] for  suggestions on securing your monitor    Follow-Up: At [facility], you and your health needs are our priority.  As part of our continuing  mission to provide you with exceptional heart care, we have created designated Provider Care Teams.  These Care Teams include your primary Cardiologist (physician) and Advanced Practice Providers (APPs -  Physician Assistants and Nurse Practitioners) who all work together to provide you with the care you need, when you need it.  We recommend signing up for the patient portal called "MyChart".  Sign up information is provided on this After Visit Summary.  MyChart is used to connect with patients for Virtual Visits (Telemedicine).  Patients are able to view lab/test results, encounter notes, upcoming appointments, etc.  Non-urgent messages can be sent to your provider as well.   To learn more about what you can do with MyChart, go to [email or web address].    Your next appointment:   6 week(s)  The format for your next appointment:   In Person  Provider:   Ambrose Pancoast, NP       Other Instructions   Important Information About Sugar

## 2021-12-20 NOTE — Progress Notes (Unsigned)
Enrolled for [employer] to mail a ZIO XT long term holter monitor to the patients address on file.   Dr. Turner to read. 

## 2021-12-28 DIAGNOSIS — R262 Difficulty in walking, not elsewhere classified: Secondary | ICD-10-CM | POA: Diagnosis not present

## 2021-12-28 DIAGNOSIS — R41841 Cognitive communication deficit: Secondary | ICD-10-CM | POA: Diagnosis not present

## 2021-12-28 DIAGNOSIS — Z9181 History of falling: Secondary | ICD-10-CM | POA: Diagnosis not present

## 2021-12-28 DIAGNOSIS — R2681 Unsteadiness on feet: Secondary | ICD-10-CM | POA: Diagnosis not present

## 2021-12-28 DIAGNOSIS — M6281 Muscle weakness (generalized): Secondary | ICD-10-CM | POA: Diagnosis not present

## 2022-01-04 DIAGNOSIS — R41841 Cognitive communication deficit: Secondary | ICD-10-CM | POA: Diagnosis not present

## 2022-01-04 DIAGNOSIS — M6281 Muscle weakness (generalized): Secondary | ICD-10-CM | POA: Diagnosis not present

## 2022-01-04 DIAGNOSIS — Z9181 History of falling: Secondary | ICD-10-CM | POA: Diagnosis not present

## 2022-01-04 DIAGNOSIS — R262 Difficulty in walking, not elsewhere classified: Secondary | ICD-10-CM | POA: Diagnosis not present

## 2022-01-04 DIAGNOSIS — R2681 Unsteadiness on feet: Secondary | ICD-10-CM | POA: Diagnosis not present

## 2022-01-06 DIAGNOSIS — Z9181 History of falling: Secondary | ICD-10-CM | POA: Diagnosis not present

## 2022-01-06 DIAGNOSIS — R2681 Unsteadiness on feet: Secondary | ICD-10-CM | POA: Diagnosis not present

## 2022-01-06 DIAGNOSIS — R41841 Cognitive communication deficit: Secondary | ICD-10-CM | POA: Diagnosis not present

## 2022-01-06 DIAGNOSIS — R262 Difficulty in walking, not elsewhere classified: Secondary | ICD-10-CM | POA: Diagnosis not present

## 2022-01-06 DIAGNOSIS — M6281 Muscle weakness (generalized): Secondary | ICD-10-CM | POA: Diagnosis not present

## 2022-01-11 DIAGNOSIS — R262 Difficulty in walking, not elsewhere classified: Secondary | ICD-10-CM | POA: Diagnosis not present

## 2022-01-11 DIAGNOSIS — R41841 Cognitive communication deficit: Secondary | ICD-10-CM | POA: Diagnosis not present

## 2022-01-11 DIAGNOSIS — Z9181 History of falling: Secondary | ICD-10-CM | POA: Diagnosis not present

## 2022-01-11 DIAGNOSIS — R2681 Unsteadiness on feet: Secondary | ICD-10-CM | POA: Diagnosis not present

## 2022-01-11 DIAGNOSIS — M6281 Muscle weakness (generalized): Secondary | ICD-10-CM | POA: Diagnosis not present

## 2022-01-13 DIAGNOSIS — Z9181 History of falling: Secondary | ICD-10-CM | POA: Diagnosis not present

## 2022-01-13 DIAGNOSIS — R262 Difficulty in walking, not elsewhere classified: Secondary | ICD-10-CM | POA: Diagnosis not present

## 2022-01-13 DIAGNOSIS — M6281 Muscle weakness (generalized): Secondary | ICD-10-CM | POA: Diagnosis not present

## 2022-01-13 DIAGNOSIS — R41841 Cognitive communication deficit: Secondary | ICD-10-CM | POA: Diagnosis not present

## 2022-01-13 DIAGNOSIS — R2681 Unsteadiness on feet: Secondary | ICD-10-CM | POA: Diagnosis not present

## 2022-01-18 DIAGNOSIS — M6281 Muscle weakness (generalized): Secondary | ICD-10-CM | POA: Diagnosis not present

## 2022-01-18 DIAGNOSIS — R262 Difficulty in walking, not elsewhere classified: Secondary | ICD-10-CM | POA: Diagnosis not present

## 2022-01-18 DIAGNOSIS — Z9181 History of falling: Secondary | ICD-10-CM | POA: Diagnosis not present

## 2022-01-18 DIAGNOSIS — R2681 Unsteadiness on feet: Secondary | ICD-10-CM | POA: Diagnosis not present

## 2022-01-18 DIAGNOSIS — R41841 Cognitive communication deficit: Secondary | ICD-10-CM | POA: Diagnosis not present

## 2022-01-19 ENCOUNTER — Other Ambulatory Visit: Payer: Self-pay | Admitting: Nurse Practitioner

## 2022-01-19 DIAGNOSIS — M6281 Muscle weakness (generalized): Secondary | ICD-10-CM | POA: Diagnosis not present

## 2022-01-19 DIAGNOSIS — R41841 Cognitive communication deficit: Secondary | ICD-10-CM | POA: Diagnosis not present

## 2022-01-19 DIAGNOSIS — R262 Difficulty in walking, not elsewhere classified: Secondary | ICD-10-CM | POA: Diagnosis not present

## 2022-01-19 DIAGNOSIS — R2681 Unsteadiness on feet: Secondary | ICD-10-CM | POA: Diagnosis not present

## 2022-01-19 DIAGNOSIS — Z9181 History of falling: Secondary | ICD-10-CM | POA: Diagnosis not present

## 2022-01-19 MED ORDER — METOPROLOL SUCCINATE ER 50 MG PO TB24: 50.0000 mg | ORAL_TABLET | Freq: Every day | ORAL | 3 refills | Status: DC

## 2022-01-20 DIAGNOSIS — R262 Difficulty in walking, not elsewhere classified: Secondary | ICD-10-CM | POA: Diagnosis not present

## 2022-01-20 DIAGNOSIS — Z9181 History of falling: Secondary | ICD-10-CM | POA: Diagnosis not present

## 2022-01-20 DIAGNOSIS — M6281 Muscle weakness (generalized): Secondary | ICD-10-CM | POA: Diagnosis not present

## 2022-01-20 DIAGNOSIS — R41841 Cognitive communication deficit: Secondary | ICD-10-CM | POA: Diagnosis not present

## 2022-01-20 DIAGNOSIS — R2681 Unsteadiness on feet: Secondary | ICD-10-CM | POA: Diagnosis not present

## 2022-01-21 ENCOUNTER — Encounter (INDEPENDENT_AMBULATORY_CARE_PROVIDER_SITE_OTHER): Payer: Medicare HMO | Admitting: Ophthalmology

## 2022-01-21 DIAGNOSIS — R262 Difficulty in walking, not elsewhere classified: Secondary | ICD-10-CM | POA: Diagnosis not present

## 2022-01-21 DIAGNOSIS — R2681 Unsteadiness on feet: Secondary | ICD-10-CM | POA: Diagnosis not present

## 2022-01-21 DIAGNOSIS — Z9181 History of falling: Secondary | ICD-10-CM | POA: Diagnosis not present

## 2022-01-21 DIAGNOSIS — R41841 Cognitive communication deficit: Secondary | ICD-10-CM | POA: Diagnosis not present

## 2022-01-21 DIAGNOSIS — M6281 Muscle weakness (generalized): Secondary | ICD-10-CM | POA: Diagnosis not present

## 2022-01-24 DIAGNOSIS — R2681 Unsteadiness on feet: Secondary | ICD-10-CM | POA: Diagnosis not present

## 2022-01-24 DIAGNOSIS — M6281 Muscle weakness (generalized): Secondary | ICD-10-CM | POA: Diagnosis not present

## 2022-01-24 DIAGNOSIS — R262 Difficulty in walking, not elsewhere classified: Secondary | ICD-10-CM | POA: Diagnosis not present

## 2022-01-24 DIAGNOSIS — Z9181 History of falling: Secondary | ICD-10-CM | POA: Diagnosis not present

## 2022-01-24 DIAGNOSIS — R41841 Cognitive communication deficit: Secondary | ICD-10-CM | POA: Diagnosis not present

## 2022-01-25 DIAGNOSIS — R262 Difficulty in walking, not elsewhere classified: Secondary | ICD-10-CM | POA: Diagnosis not present

## 2022-01-25 DIAGNOSIS — R41841 Cognitive communication deficit: Secondary | ICD-10-CM | POA: Diagnosis not present

## 2022-01-25 DIAGNOSIS — R2681 Unsteadiness on feet: Secondary | ICD-10-CM | POA: Diagnosis not present

## 2022-01-25 DIAGNOSIS — Z9181 History of falling: Secondary | ICD-10-CM | POA: Diagnosis not present

## 2022-01-25 DIAGNOSIS — M6281 Muscle weakness (generalized): Secondary | ICD-10-CM | POA: Diagnosis not present

## 2022-01-26 DIAGNOSIS — R262 Difficulty in walking, not elsewhere classified: Secondary | ICD-10-CM | POA: Diagnosis not present

## 2022-01-26 DIAGNOSIS — R2681 Unsteadiness on feet: Secondary | ICD-10-CM | POA: Diagnosis not present

## 2022-01-26 DIAGNOSIS — M6281 Muscle weakness (generalized): Secondary | ICD-10-CM | POA: Diagnosis not present

## 2022-01-26 DIAGNOSIS — R41841 Cognitive communication deficit: Secondary | ICD-10-CM | POA: Diagnosis not present

## 2022-01-26 DIAGNOSIS — Z9181 History of falling: Secondary | ICD-10-CM | POA: Diagnosis not present

## 2022-01-27 DIAGNOSIS — R41841 Cognitive communication deficit: Secondary | ICD-10-CM | POA: Diagnosis not present

## 2022-01-27 DIAGNOSIS — R2681 Unsteadiness on feet: Secondary | ICD-10-CM | POA: Diagnosis not present

## 2022-01-27 DIAGNOSIS — M6281 Muscle weakness (generalized): Secondary | ICD-10-CM | POA: Diagnosis not present

## 2022-01-27 DIAGNOSIS — Z9181 History of falling: Secondary | ICD-10-CM | POA: Diagnosis not present

## 2022-01-27 DIAGNOSIS — R262 Difficulty in walking, not elsewhere classified: Secondary | ICD-10-CM | POA: Diagnosis not present

## 2022-01-28 DIAGNOSIS — M6281 Muscle weakness (generalized): Secondary | ICD-10-CM | POA: Diagnosis not present

## 2022-01-28 DIAGNOSIS — R41841 Cognitive communication deficit: Secondary | ICD-10-CM | POA: Diagnosis not present

## 2022-01-28 DIAGNOSIS — Z9181 History of falling: Secondary | ICD-10-CM | POA: Diagnosis not present

## 2022-01-28 DIAGNOSIS — R262 Difficulty in walking, not elsewhere classified: Secondary | ICD-10-CM | POA: Diagnosis not present

## 2022-01-28 DIAGNOSIS — R2681 Unsteadiness on feet: Secondary | ICD-10-CM | POA: Diagnosis not present

## 2022-01-31 DIAGNOSIS — R41841 Cognitive communication deficit: Secondary | ICD-10-CM | POA: Diagnosis not present

## 2022-01-31 DIAGNOSIS — R2681 Unsteadiness on feet: Secondary | ICD-10-CM | POA: Diagnosis not present

## 2022-01-31 DIAGNOSIS — D0439 Carcinoma in situ of skin of other parts of face: Secondary | ICD-10-CM | POA: Diagnosis not present

## 2022-01-31 DIAGNOSIS — R262 Difficulty in walking, not elsewhere classified: Secondary | ICD-10-CM | POA: Diagnosis not present

## 2022-01-31 DIAGNOSIS — M6281 Muscle weakness (generalized): Secondary | ICD-10-CM | POA: Diagnosis not present

## 2022-01-31 DIAGNOSIS — Z9181 History of falling: Secondary | ICD-10-CM | POA: Diagnosis not present

## 2022-02-01 DIAGNOSIS — R2681 Unsteadiness on feet: Secondary | ICD-10-CM | POA: Diagnosis not present

## 2022-02-01 DIAGNOSIS — R41841 Cognitive communication deficit: Secondary | ICD-10-CM | POA: Diagnosis not present

## 2022-02-01 DIAGNOSIS — M6281 Muscle weakness (generalized): Secondary | ICD-10-CM | POA: Diagnosis not present

## 2022-02-01 DIAGNOSIS — R262 Difficulty in walking, not elsewhere classified: Secondary | ICD-10-CM | POA: Diagnosis not present

## 2022-02-01 DIAGNOSIS — Z9181 History of falling: Secondary | ICD-10-CM | POA: Diagnosis not present

## 2022-02-02 DIAGNOSIS — Z9181 History of falling: Secondary | ICD-10-CM | POA: Diagnosis not present

## 2022-02-02 DIAGNOSIS — R41841 Cognitive communication deficit: Secondary | ICD-10-CM | POA: Diagnosis not present

## 2022-02-02 DIAGNOSIS — R262 Difficulty in walking, not elsewhere classified: Secondary | ICD-10-CM | POA: Diagnosis not present

## 2022-02-02 DIAGNOSIS — M6281 Muscle weakness (generalized): Secondary | ICD-10-CM | POA: Diagnosis not present

## 2022-02-02 DIAGNOSIS — R2681 Unsteadiness on feet: Secondary | ICD-10-CM | POA: Diagnosis not present

## 2022-02-03 DIAGNOSIS — R41841 Cognitive communication deficit: Secondary | ICD-10-CM | POA: Diagnosis not present

## 2022-02-03 DIAGNOSIS — Z9181 History of falling: Secondary | ICD-10-CM | POA: Diagnosis not present

## 2022-02-03 DIAGNOSIS — R262 Difficulty in walking, not elsewhere classified: Secondary | ICD-10-CM | POA: Diagnosis not present

## 2022-02-03 DIAGNOSIS — M6281 Muscle weakness (generalized): Secondary | ICD-10-CM | POA: Diagnosis not present

## 2022-02-03 DIAGNOSIS — R2681 Unsteadiness on feet: Secondary | ICD-10-CM | POA: Diagnosis not present

## 2022-02-04 ENCOUNTER — Ambulatory Visit: Payer: Medicare HMO | Admitting: Nurse Practitioner

## 2022-02-04 DIAGNOSIS — Z9181 History of falling: Secondary | ICD-10-CM | POA: Diagnosis not present

## 2022-02-04 DIAGNOSIS — R41841 Cognitive communication deficit: Secondary | ICD-10-CM | POA: Diagnosis not present

## 2022-02-04 DIAGNOSIS — M6281 Muscle weakness (generalized): Secondary | ICD-10-CM | POA: Diagnosis not present

## 2022-02-04 DIAGNOSIS — R2681 Unsteadiness on feet: Secondary | ICD-10-CM | POA: Diagnosis not present

## 2022-02-04 DIAGNOSIS — R262 Difficulty in walking, not elsewhere classified: Secondary | ICD-10-CM | POA: Diagnosis not present

## 2022-02-07 ENCOUNTER — Encounter (INDEPENDENT_AMBULATORY_CARE_PROVIDER_SITE_OTHER): Payer: Medicare HMO | Admitting: Ophthalmology

## 2022-02-07 DIAGNOSIS — R41841 Cognitive communication deficit: Secondary | ICD-10-CM | POA: Diagnosis not present

## 2022-02-07 DIAGNOSIS — R262 Difficulty in walking, not elsewhere classified: Secondary | ICD-10-CM | POA: Diagnosis not present

## 2022-02-07 DIAGNOSIS — R2681 Unsteadiness on feet: Secondary | ICD-10-CM | POA: Diagnosis not present

## 2022-02-07 DIAGNOSIS — M6281 Muscle weakness (generalized): Secondary | ICD-10-CM | POA: Diagnosis not present

## 2022-02-07 DIAGNOSIS — Z9181 History of falling: Secondary | ICD-10-CM | POA: Diagnosis not present

## 2022-02-08 DIAGNOSIS — Z9181 History of falling: Secondary | ICD-10-CM | POA: Diagnosis not present

## 2022-02-08 DIAGNOSIS — M6281 Muscle weakness (generalized): Secondary | ICD-10-CM | POA: Diagnosis not present

## 2022-02-08 DIAGNOSIS — R41841 Cognitive communication deficit: Secondary | ICD-10-CM | POA: Diagnosis not present

## 2022-02-08 DIAGNOSIS — R2681 Unsteadiness on feet: Secondary | ICD-10-CM | POA: Diagnosis not present

## 2022-02-08 DIAGNOSIS — R262 Difficulty in walking, not elsewhere classified: Secondary | ICD-10-CM | POA: Diagnosis not present

## 2022-02-09 DIAGNOSIS — M6281 Muscle weakness (generalized): Secondary | ICD-10-CM | POA: Diagnosis not present

## 2022-02-09 DIAGNOSIS — R262 Difficulty in walking, not elsewhere classified: Secondary | ICD-10-CM | POA: Diagnosis not present

## 2022-02-09 DIAGNOSIS — R41841 Cognitive communication deficit: Secondary | ICD-10-CM | POA: Diagnosis not present

## 2022-02-09 DIAGNOSIS — R2681 Unsteadiness on feet: Secondary | ICD-10-CM | POA: Diagnosis not present

## 2022-02-09 DIAGNOSIS — Z9181 History of falling: Secondary | ICD-10-CM | POA: Diagnosis not present

## 2022-02-10 DIAGNOSIS — R2681 Unsteadiness on feet: Secondary | ICD-10-CM | POA: Diagnosis not present

## 2022-02-10 DIAGNOSIS — M6281 Muscle weakness (generalized): Secondary | ICD-10-CM | POA: Diagnosis not present

## 2022-02-10 DIAGNOSIS — R41841 Cognitive communication deficit: Secondary | ICD-10-CM | POA: Diagnosis not present

## 2022-02-10 DIAGNOSIS — R262 Difficulty in walking, not elsewhere classified: Secondary | ICD-10-CM | POA: Diagnosis not present

## 2022-02-10 DIAGNOSIS — Z9181 History of falling: Secondary | ICD-10-CM | POA: Diagnosis not present

## 2022-02-11 DIAGNOSIS — Z9181 History of falling: Secondary | ICD-10-CM | POA: Diagnosis not present

## 2022-02-11 DIAGNOSIS — M6281 Muscle weakness (generalized): Secondary | ICD-10-CM | POA: Diagnosis not present

## 2022-02-11 DIAGNOSIS — R2681 Unsteadiness on feet: Secondary | ICD-10-CM | POA: Diagnosis not present

## 2022-02-11 DIAGNOSIS — R262 Difficulty in walking, not elsewhere classified: Secondary | ICD-10-CM | POA: Diagnosis not present

## 2022-02-11 DIAGNOSIS — R41841 Cognitive communication deficit: Secondary | ICD-10-CM | POA: Diagnosis not present

## 2022-02-14 DIAGNOSIS — M6281 Muscle weakness (generalized): Secondary | ICD-10-CM | POA: Diagnosis not present

## 2022-02-14 DIAGNOSIS — R2681 Unsteadiness on feet: Secondary | ICD-10-CM | POA: Diagnosis not present

## 2022-02-14 DIAGNOSIS — Z9181 History of falling: Secondary | ICD-10-CM | POA: Diagnosis not present

## 2022-02-14 DIAGNOSIS — R262 Difficulty in walking, not elsewhere classified: Secondary | ICD-10-CM | POA: Diagnosis not present

## 2022-02-14 DIAGNOSIS — R41841 Cognitive communication deficit: Secondary | ICD-10-CM | POA: Diagnosis not present

## 2022-02-15 DIAGNOSIS — R262 Difficulty in walking, not elsewhere classified: Secondary | ICD-10-CM | POA: Diagnosis not present

## 2022-02-15 DIAGNOSIS — R2681 Unsteadiness on feet: Secondary | ICD-10-CM | POA: Diagnosis not present

## 2022-02-15 DIAGNOSIS — R41841 Cognitive communication deficit: Secondary | ICD-10-CM | POA: Diagnosis not present

## 2022-02-15 DIAGNOSIS — Z9181 History of falling: Secondary | ICD-10-CM | POA: Diagnosis not present

## 2022-02-15 DIAGNOSIS — M6281 Muscle weakness (generalized): Secondary | ICD-10-CM | POA: Diagnosis not present

## 2022-02-16 DIAGNOSIS — M6281 Muscle weakness (generalized): Secondary | ICD-10-CM | POA: Diagnosis not present

## 2022-02-16 DIAGNOSIS — R262 Difficulty in walking, not elsewhere classified: Secondary | ICD-10-CM | POA: Diagnosis not present

## 2022-02-16 DIAGNOSIS — R41841 Cognitive communication deficit: Secondary | ICD-10-CM | POA: Diagnosis not present

## 2022-02-16 DIAGNOSIS — R2681 Unsteadiness on feet: Secondary | ICD-10-CM | POA: Diagnosis not present

## 2022-02-16 DIAGNOSIS — Z9181 History of falling: Secondary | ICD-10-CM | POA: Diagnosis not present

## 2022-02-17 DIAGNOSIS — M6281 Muscle weakness (generalized): Secondary | ICD-10-CM | POA: Diagnosis not present

## 2022-02-17 DIAGNOSIS — R41841 Cognitive communication deficit: Secondary | ICD-10-CM | POA: Diagnosis not present

## 2022-02-17 DIAGNOSIS — Z9181 History of falling: Secondary | ICD-10-CM | POA: Diagnosis not present

## 2022-02-17 DIAGNOSIS — R262 Difficulty in walking, not elsewhere classified: Secondary | ICD-10-CM | POA: Diagnosis not present

## 2022-02-17 DIAGNOSIS — R2681 Unsteadiness on feet: Secondary | ICD-10-CM | POA: Diagnosis not present

## 2022-02-18 DIAGNOSIS — R262 Difficulty in walking, not elsewhere classified: Secondary | ICD-10-CM | POA: Diagnosis not present

## 2022-02-18 DIAGNOSIS — Z9181 History of falling: Secondary | ICD-10-CM | POA: Diagnosis not present

## 2022-02-18 DIAGNOSIS — M6281 Muscle weakness (generalized): Secondary | ICD-10-CM | POA: Diagnosis not present

## 2022-02-18 DIAGNOSIS — R2681 Unsteadiness on feet: Secondary | ICD-10-CM | POA: Diagnosis not present

## 2022-02-18 DIAGNOSIS — R41841 Cognitive communication deficit: Secondary | ICD-10-CM | POA: Diagnosis not present

## 2022-02-21 DIAGNOSIS — R262 Difficulty in walking, not elsewhere classified: Secondary | ICD-10-CM | POA: Diagnosis not present

## 2022-02-21 DIAGNOSIS — R2681 Unsteadiness on feet: Secondary | ICD-10-CM | POA: Diagnosis not present

## 2022-02-21 DIAGNOSIS — M6281 Muscle weakness (generalized): Secondary | ICD-10-CM | POA: Diagnosis not present

## 2022-02-21 DIAGNOSIS — Z9181 History of falling: Secondary | ICD-10-CM | POA: Diagnosis not present

## 2022-02-21 DIAGNOSIS — R41841 Cognitive communication deficit: Secondary | ICD-10-CM | POA: Diagnosis not present

## 2022-02-22 DIAGNOSIS — M6281 Muscle weakness (generalized): Secondary | ICD-10-CM | POA: Diagnosis not present

## 2022-02-22 DIAGNOSIS — R262 Difficulty in walking, not elsewhere classified: Secondary | ICD-10-CM | POA: Diagnosis not present

## 2022-02-22 DIAGNOSIS — R41841 Cognitive communication deficit: Secondary | ICD-10-CM | POA: Diagnosis not present

## 2022-02-22 DIAGNOSIS — R2681 Unsteadiness on feet: Secondary | ICD-10-CM | POA: Diagnosis not present

## 2022-02-22 DIAGNOSIS — Z9181 History of falling: Secondary | ICD-10-CM | POA: Diagnosis not present

## 2022-02-23 DIAGNOSIS — M6281 Muscle weakness (generalized): Secondary | ICD-10-CM | POA: Diagnosis not present

## 2022-02-23 DIAGNOSIS — Z9181 History of falling: Secondary | ICD-10-CM | POA: Diagnosis not present

## 2022-02-23 DIAGNOSIS — R2681 Unsteadiness on feet: Secondary | ICD-10-CM | POA: Diagnosis not present

## 2022-02-23 DIAGNOSIS — R41841 Cognitive communication deficit: Secondary | ICD-10-CM | POA: Diagnosis not present

## 2022-02-23 DIAGNOSIS — R262 Difficulty in walking, not elsewhere classified: Secondary | ICD-10-CM | POA: Diagnosis not present

## 2022-02-24 DIAGNOSIS — M6281 Muscle weakness (generalized): Secondary | ICD-10-CM | POA: Diagnosis not present

## 2022-02-24 DIAGNOSIS — R41841 Cognitive communication deficit: Secondary | ICD-10-CM | POA: Diagnosis not present

## 2022-02-24 DIAGNOSIS — R2681 Unsteadiness on feet: Secondary | ICD-10-CM | POA: Diagnosis not present

## 2022-02-24 DIAGNOSIS — Z9181 History of falling: Secondary | ICD-10-CM | POA: Diagnosis not present

## 2022-02-24 DIAGNOSIS — R262 Difficulty in walking, not elsewhere classified: Secondary | ICD-10-CM | POA: Diagnosis not present

## 2022-02-25 DIAGNOSIS — R2681 Unsteadiness on feet: Secondary | ICD-10-CM | POA: Diagnosis not present

## 2022-02-25 DIAGNOSIS — Z9181 History of falling: Secondary | ICD-10-CM | POA: Diagnosis not present

## 2022-02-25 DIAGNOSIS — M6281 Muscle weakness (generalized): Secondary | ICD-10-CM | POA: Diagnosis not present

## 2022-02-25 DIAGNOSIS — R262 Difficulty in walking, not elsewhere classified: Secondary | ICD-10-CM | POA: Diagnosis not present

## 2022-02-25 DIAGNOSIS — R41841 Cognitive communication deficit: Secondary | ICD-10-CM | POA: Diagnosis not present

## 2022-02-28 DIAGNOSIS — Z9181 History of falling: Secondary | ICD-10-CM | POA: Diagnosis not present

## 2022-02-28 DIAGNOSIS — R41841 Cognitive communication deficit: Secondary | ICD-10-CM | POA: Diagnosis not present

## 2022-02-28 DIAGNOSIS — R262 Difficulty in walking, not elsewhere classified: Secondary | ICD-10-CM | POA: Diagnosis not present

## 2022-02-28 DIAGNOSIS — M6281 Muscle weakness (generalized): Secondary | ICD-10-CM | POA: Diagnosis not present

## 2022-02-28 DIAGNOSIS — R2681 Unsteadiness on feet: Secondary | ICD-10-CM | POA: Diagnosis not present

## 2022-03-01 DIAGNOSIS — R41841 Cognitive communication deficit: Secondary | ICD-10-CM | POA: Diagnosis not present

## 2022-03-01 DIAGNOSIS — M6281 Muscle weakness (generalized): Secondary | ICD-10-CM | POA: Diagnosis not present

## 2022-03-01 DIAGNOSIS — Z9181 History of falling: Secondary | ICD-10-CM | POA: Diagnosis not present

## 2022-03-01 DIAGNOSIS — R2681 Unsteadiness on feet: Secondary | ICD-10-CM | POA: Diagnosis not present

## 2022-03-01 DIAGNOSIS — R262 Difficulty in walking, not elsewhere classified: Secondary | ICD-10-CM | POA: Diagnosis not present

## 2022-03-02 DIAGNOSIS — M6281 Muscle weakness (generalized): Secondary | ICD-10-CM | POA: Diagnosis not present

## 2022-03-02 DIAGNOSIS — R41841 Cognitive communication deficit: Secondary | ICD-10-CM | POA: Diagnosis not present

## 2022-03-02 DIAGNOSIS — Z9181 History of falling: Secondary | ICD-10-CM | POA: Diagnosis not present

## 2022-03-02 DIAGNOSIS — R2681 Unsteadiness on feet: Secondary | ICD-10-CM | POA: Diagnosis not present

## 2022-03-02 DIAGNOSIS — R262 Difficulty in walking, not elsewhere classified: Secondary | ICD-10-CM | POA: Diagnosis not present

## 2022-03-03 DIAGNOSIS — R41841 Cognitive communication deficit: Secondary | ICD-10-CM | POA: Diagnosis not present

## 2022-03-03 DIAGNOSIS — R2681 Unsteadiness on feet: Secondary | ICD-10-CM | POA: Diagnosis not present

## 2022-03-03 DIAGNOSIS — R262 Difficulty in walking, not elsewhere classified: Secondary | ICD-10-CM | POA: Diagnosis not present

## 2022-03-03 DIAGNOSIS — M6281 Muscle weakness (generalized): Secondary | ICD-10-CM | POA: Diagnosis not present

## 2022-03-03 DIAGNOSIS — Z9181 History of falling: Secondary | ICD-10-CM | POA: Diagnosis not present

## 2022-03-04 DIAGNOSIS — R262 Difficulty in walking, not elsewhere classified: Secondary | ICD-10-CM | POA: Diagnosis not present

## 2022-03-04 DIAGNOSIS — R2681 Unsteadiness on feet: Secondary | ICD-10-CM | POA: Diagnosis not present

## 2022-03-04 DIAGNOSIS — R41841 Cognitive communication deficit: Secondary | ICD-10-CM | POA: Diagnosis not present

## 2022-03-04 DIAGNOSIS — Z9181 History of falling: Secondary | ICD-10-CM | POA: Diagnosis not present

## 2022-03-04 DIAGNOSIS — M6281 Muscle weakness (generalized): Secondary | ICD-10-CM | POA: Diagnosis not present

## 2022-03-07 DIAGNOSIS — Z9181 History of falling: Secondary | ICD-10-CM | POA: Diagnosis not present

## 2022-03-07 DIAGNOSIS — M6281 Muscle weakness (generalized): Secondary | ICD-10-CM | POA: Diagnosis not present

## 2022-03-07 DIAGNOSIS — R262 Difficulty in walking, not elsewhere classified: Secondary | ICD-10-CM | POA: Diagnosis not present

## 2022-03-07 DIAGNOSIS — R41841 Cognitive communication deficit: Secondary | ICD-10-CM | POA: Diagnosis not present

## 2022-03-07 DIAGNOSIS — R2681 Unsteadiness on feet: Secondary | ICD-10-CM | POA: Diagnosis not present

## 2022-03-08 DIAGNOSIS — R2681 Unsteadiness on feet: Secondary | ICD-10-CM | POA: Diagnosis not present

## 2022-03-08 DIAGNOSIS — R262 Difficulty in walking, not elsewhere classified: Secondary | ICD-10-CM | POA: Diagnosis not present

## 2022-03-08 DIAGNOSIS — Z9181 History of falling: Secondary | ICD-10-CM | POA: Diagnosis not present

## 2022-03-08 DIAGNOSIS — R41841 Cognitive communication deficit: Secondary | ICD-10-CM | POA: Diagnosis not present

## 2022-03-08 DIAGNOSIS — M6281 Muscle weakness (generalized): Secondary | ICD-10-CM | POA: Diagnosis not present

## 2022-03-09 DIAGNOSIS — M6281 Muscle weakness (generalized): Secondary | ICD-10-CM | POA: Diagnosis not present

## 2022-03-09 DIAGNOSIS — R41841 Cognitive communication deficit: Secondary | ICD-10-CM | POA: Diagnosis not present

## 2022-03-09 DIAGNOSIS — Z9181 History of falling: Secondary | ICD-10-CM | POA: Diagnosis not present

## 2022-03-09 DIAGNOSIS — R262 Difficulty in walking, not elsewhere classified: Secondary | ICD-10-CM | POA: Diagnosis not present

## 2022-03-09 DIAGNOSIS — R2681 Unsteadiness on feet: Secondary | ICD-10-CM | POA: Diagnosis not present

## 2022-03-10 DIAGNOSIS — R262 Difficulty in walking, not elsewhere classified: Secondary | ICD-10-CM | POA: Diagnosis not present

## 2022-03-10 DIAGNOSIS — M6281 Muscle weakness (generalized): Secondary | ICD-10-CM | POA: Diagnosis not present

## 2022-03-10 DIAGNOSIS — Z9181 History of falling: Secondary | ICD-10-CM | POA: Diagnosis not present

## 2022-03-10 DIAGNOSIS — R2681 Unsteadiness on feet: Secondary | ICD-10-CM | POA: Diagnosis not present

## 2022-03-10 DIAGNOSIS — R41841 Cognitive communication deficit: Secondary | ICD-10-CM | POA: Diagnosis not present

## 2022-03-10 DIAGNOSIS — E538 Deficiency of other specified B group vitamins: Secondary | ICD-10-CM | POA: Diagnosis not present

## 2022-03-11 DIAGNOSIS — M6281 Muscle weakness (generalized): Secondary | ICD-10-CM | POA: Diagnosis not present

## 2022-03-11 DIAGNOSIS — R2681 Unsteadiness on feet: Secondary | ICD-10-CM | POA: Diagnosis not present

## 2022-03-11 DIAGNOSIS — R262 Difficulty in walking, not elsewhere classified: Secondary | ICD-10-CM | POA: Diagnosis not present

## 2022-03-11 DIAGNOSIS — Z9181 History of falling: Secondary | ICD-10-CM | POA: Diagnosis not present

## 2022-03-11 DIAGNOSIS — R41841 Cognitive communication deficit: Secondary | ICD-10-CM | POA: Diagnosis not present

## 2022-03-14 DIAGNOSIS — R2681 Unsteadiness on feet: Secondary | ICD-10-CM | POA: Diagnosis not present

## 2022-03-14 DIAGNOSIS — R41841 Cognitive communication deficit: Secondary | ICD-10-CM | POA: Diagnosis not present

## 2022-03-14 DIAGNOSIS — Z9181 History of falling: Secondary | ICD-10-CM | POA: Diagnosis not present

## 2022-03-14 DIAGNOSIS — R262 Difficulty in walking, not elsewhere classified: Secondary | ICD-10-CM | POA: Diagnosis not present

## 2022-03-14 DIAGNOSIS — M6281 Muscle weakness (generalized): Secondary | ICD-10-CM | POA: Diagnosis not present

## 2022-03-16 DIAGNOSIS — R262 Difficulty in walking, not elsewhere classified: Secondary | ICD-10-CM | POA: Diagnosis not present

## 2022-03-16 DIAGNOSIS — Z9181 History of falling: Secondary | ICD-10-CM | POA: Diagnosis not present

## 2022-03-16 DIAGNOSIS — R2681 Unsteadiness on feet: Secondary | ICD-10-CM | POA: Diagnosis not present

## 2022-03-16 DIAGNOSIS — M6281 Muscle weakness (generalized): Secondary | ICD-10-CM | POA: Diagnosis not present

## 2022-03-16 DIAGNOSIS — R41841 Cognitive communication deficit: Secondary | ICD-10-CM | POA: Diagnosis not present

## 2022-03-18 DIAGNOSIS — R41841 Cognitive communication deficit: Secondary | ICD-10-CM | POA: Diagnosis not present

## 2022-03-21 DIAGNOSIS — Z08 Encounter for follow-up examination after completed treatment for malignant neoplasm: Secondary | ICD-10-CM | POA: Diagnosis not present

## 2022-03-21 DIAGNOSIS — Z86007 Personal history of in-situ neoplasm of skin: Secondary | ICD-10-CM | POA: Diagnosis not present

## 2022-03-21 DIAGNOSIS — Z85828 Personal history of other malignant neoplasm of skin: Secondary | ICD-10-CM | POA: Diagnosis not present

## 2022-03-21 DIAGNOSIS — R41841 Cognitive communication deficit: Secondary | ICD-10-CM | POA: Diagnosis not present

## 2022-03-23 DIAGNOSIS — R41841 Cognitive communication deficit: Secondary | ICD-10-CM | POA: Diagnosis not present

## 2022-03-25 DIAGNOSIS — R41841 Cognitive communication deficit: Secondary | ICD-10-CM | POA: Diagnosis not present

## 2022-03-28 DIAGNOSIS — R41841 Cognitive communication deficit: Secondary | ICD-10-CM | POA: Diagnosis not present

## 2022-03-30 DIAGNOSIS — R41841 Cognitive communication deficit: Secondary | ICD-10-CM | POA: Diagnosis not present

## 2022-04-01 DIAGNOSIS — R41841 Cognitive communication deficit: Secondary | ICD-10-CM | POA: Diagnosis not present

## 2022-04-04 DIAGNOSIS — I10 Essential (primary) hypertension: Secondary | ICD-10-CM | POA: Diagnosis not present

## 2022-04-04 DIAGNOSIS — I5189 Other ill-defined heart diseases: Secondary | ICD-10-CM | POA: Diagnosis not present

## 2022-04-04 DIAGNOSIS — J479 Bronchiectasis, uncomplicated: Secondary | ICD-10-CM | POA: Diagnosis not present

## 2022-04-04 DIAGNOSIS — R41841 Cognitive communication deficit: Secondary | ICD-10-CM | POA: Diagnosis not present

## 2022-04-04 DIAGNOSIS — J309 Allergic rhinitis, unspecified: Secondary | ICD-10-CM | POA: Diagnosis not present

## 2022-04-04 DIAGNOSIS — D649 Anemia, unspecified: Secondary | ICD-10-CM | POA: Diagnosis not present

## 2022-04-04 DIAGNOSIS — N1832 Chronic kidney disease, stage 3b: Secondary | ICD-10-CM | POA: Diagnosis not present

## 2022-04-04 DIAGNOSIS — J45909 Unspecified asthma, uncomplicated: Secondary | ICD-10-CM | POA: Diagnosis not present

## 2022-04-04 DIAGNOSIS — J449 Chronic obstructive pulmonary disease, unspecified: Secondary | ICD-10-CM | POA: Diagnosis not present

## 2022-04-04 DIAGNOSIS — F419 Anxiety disorder, unspecified: Secondary | ICD-10-CM | POA: Diagnosis not present

## 2022-04-04 DIAGNOSIS — E785 Hyperlipidemia, unspecified: Secondary | ICD-10-CM | POA: Diagnosis not present

## 2022-04-04 DIAGNOSIS — C50919 Malignant neoplasm of unspecified site of unspecified female breast: Secondary | ICD-10-CM | POA: Diagnosis not present

## 2022-04-06 DIAGNOSIS — R41841 Cognitive communication deficit: Secondary | ICD-10-CM | POA: Diagnosis not present

## 2022-04-08 DIAGNOSIS — R41841 Cognitive communication deficit: Secondary | ICD-10-CM | POA: Diagnosis not present

## 2022-04-12 DIAGNOSIS — R41841 Cognitive communication deficit: Secondary | ICD-10-CM | POA: Diagnosis not present

## 2022-04-13 DIAGNOSIS — R41841 Cognitive communication deficit: Secondary | ICD-10-CM | POA: Diagnosis not present

## 2022-04-19 DIAGNOSIS — M6281 Muscle weakness (generalized): Secondary | ICD-10-CM | POA: Diagnosis not present

## 2022-04-19 DIAGNOSIS — R1312 Dysphagia, oropharyngeal phase: Secondary | ICD-10-CM | POA: Diagnosis not present

## 2022-04-19 DIAGNOSIS — R2681 Unsteadiness on feet: Secondary | ICD-10-CM | POA: Diagnosis not present

## 2022-04-19 DIAGNOSIS — R41841 Cognitive communication deficit: Secondary | ICD-10-CM | POA: Diagnosis not present

## 2022-04-22 DIAGNOSIS — R1312 Dysphagia, oropharyngeal phase: Secondary | ICD-10-CM | POA: Diagnosis not present

## 2022-04-22 DIAGNOSIS — R41841 Cognitive communication deficit: Secondary | ICD-10-CM | POA: Diagnosis not present

## 2022-04-22 DIAGNOSIS — M6281 Muscle weakness (generalized): Secondary | ICD-10-CM | POA: Diagnosis not present

## 2022-04-22 DIAGNOSIS — R2681 Unsteadiness on feet: Secondary | ICD-10-CM | POA: Diagnosis not present

## 2022-04-25 DIAGNOSIS — J45909 Unspecified asthma, uncomplicated: Secondary | ICD-10-CM | POA: Diagnosis not present

## 2022-04-25 DIAGNOSIS — R531 Weakness: Secondary | ICD-10-CM | POA: Diagnosis not present

## 2022-04-25 DIAGNOSIS — N390 Urinary tract infection, site not specified: Secondary | ICD-10-CM | POA: Diagnosis not present

## 2022-04-25 DIAGNOSIS — J189 Pneumonia, unspecified organism: Secondary | ICD-10-CM | POA: Diagnosis not present

## 2022-04-25 DIAGNOSIS — J449 Chronic obstructive pulmonary disease, unspecified: Secondary | ICD-10-CM | POA: Diagnosis not present

## 2022-04-25 DIAGNOSIS — R051 Acute cough: Secondary | ICD-10-CM | POA: Diagnosis not present

## 2022-04-25 DIAGNOSIS — R8281 Pyuria: Secondary | ICD-10-CM | POA: Diagnosis not present

## 2022-04-25 DIAGNOSIS — Z1152 Encounter for screening for COVID-19: Secondary | ICD-10-CM | POA: Diagnosis not present

## 2022-04-26 DIAGNOSIS — R41841 Cognitive communication deficit: Secondary | ICD-10-CM | POA: Diagnosis not present

## 2022-04-26 DIAGNOSIS — R2681 Unsteadiness on feet: Secondary | ICD-10-CM | POA: Diagnosis not present

## 2022-04-26 DIAGNOSIS — R1312 Dysphagia, oropharyngeal phase: Secondary | ICD-10-CM | POA: Diagnosis not present

## 2022-04-26 DIAGNOSIS — M6281 Muscle weakness (generalized): Secondary | ICD-10-CM | POA: Diagnosis not present

## 2022-04-27 DIAGNOSIS — R41841 Cognitive communication deficit: Secondary | ICD-10-CM | POA: Diagnosis not present

## 2022-04-27 DIAGNOSIS — R1312 Dysphagia, oropharyngeal phase: Secondary | ICD-10-CM | POA: Diagnosis not present

## 2022-04-27 DIAGNOSIS — M6281 Muscle weakness (generalized): Secondary | ICD-10-CM | POA: Diagnosis not present

## 2022-04-27 DIAGNOSIS — R2681 Unsteadiness on feet: Secondary | ICD-10-CM | POA: Diagnosis not present

## 2022-04-28 DIAGNOSIS — R2681 Unsteadiness on feet: Secondary | ICD-10-CM | POA: Diagnosis not present

## 2022-04-28 DIAGNOSIS — R1312 Dysphagia, oropharyngeal phase: Secondary | ICD-10-CM | POA: Diagnosis not present

## 2022-04-28 DIAGNOSIS — M6281 Muscle weakness (generalized): Secondary | ICD-10-CM | POA: Diagnosis not present

## 2022-04-28 DIAGNOSIS — R41841 Cognitive communication deficit: Secondary | ICD-10-CM | POA: Diagnosis not present

## 2022-04-29 DIAGNOSIS — M6281 Muscle weakness (generalized): Secondary | ICD-10-CM | POA: Diagnosis not present

## 2022-04-29 DIAGNOSIS — R2681 Unsteadiness on feet: Secondary | ICD-10-CM | POA: Diagnosis not present

## 2022-04-29 DIAGNOSIS — R41841 Cognitive communication deficit: Secondary | ICD-10-CM | POA: Diagnosis not present

## 2022-04-29 DIAGNOSIS — R1312 Dysphagia, oropharyngeal phase: Secondary | ICD-10-CM | POA: Diagnosis not present

## 2022-05-02 DIAGNOSIS — R1312 Dysphagia, oropharyngeal phase: Secondary | ICD-10-CM | POA: Diagnosis not present

## 2022-05-02 DIAGNOSIS — M6281 Muscle weakness (generalized): Secondary | ICD-10-CM | POA: Diagnosis not present

## 2022-05-02 DIAGNOSIS — R2681 Unsteadiness on feet: Secondary | ICD-10-CM | POA: Diagnosis not present

## 2022-05-02 DIAGNOSIS — R41841 Cognitive communication deficit: Secondary | ICD-10-CM | POA: Diagnosis not present

## 2022-05-03 DIAGNOSIS — R2681 Unsteadiness on feet: Secondary | ICD-10-CM | POA: Diagnosis not present

## 2022-05-03 DIAGNOSIS — R1312 Dysphagia, oropharyngeal phase: Secondary | ICD-10-CM | POA: Diagnosis not present

## 2022-05-03 DIAGNOSIS — M6281 Muscle weakness (generalized): Secondary | ICD-10-CM | POA: Diagnosis not present

## 2022-05-03 DIAGNOSIS — R41841 Cognitive communication deficit: Secondary | ICD-10-CM | POA: Diagnosis not present

## 2022-05-04 DIAGNOSIS — F419 Anxiety disorder, unspecified: Secondary | ICD-10-CM | POA: Diagnosis not present

## 2022-05-04 DIAGNOSIS — J449 Chronic obstructive pulmonary disease, unspecified: Secondary | ICD-10-CM | POA: Diagnosis not present

## 2022-05-04 DIAGNOSIS — R1312 Dysphagia, oropharyngeal phase: Secondary | ICD-10-CM | POA: Diagnosis not present

## 2022-05-04 DIAGNOSIS — M6281 Muscle weakness (generalized): Secondary | ICD-10-CM | POA: Diagnosis not present

## 2022-05-04 DIAGNOSIS — R2681 Unsteadiness on feet: Secondary | ICD-10-CM | POA: Diagnosis not present

## 2022-05-04 DIAGNOSIS — J181 Lobar pneumonia, unspecified organism: Secondary | ICD-10-CM | POA: Diagnosis not present

## 2022-05-04 DIAGNOSIS — R41841 Cognitive communication deficit: Secondary | ICD-10-CM | POA: Diagnosis not present

## 2022-05-05 DIAGNOSIS — M6281 Muscle weakness (generalized): Secondary | ICD-10-CM | POA: Diagnosis not present

## 2022-05-05 DIAGNOSIS — R1312 Dysphagia, oropharyngeal phase: Secondary | ICD-10-CM | POA: Diagnosis not present

## 2022-05-05 DIAGNOSIS — R2681 Unsteadiness on feet: Secondary | ICD-10-CM | POA: Diagnosis not present

## 2022-05-05 DIAGNOSIS — R41841 Cognitive communication deficit: Secondary | ICD-10-CM | POA: Diagnosis not present

## 2022-05-06 DIAGNOSIS — R2681 Unsteadiness on feet: Secondary | ICD-10-CM | POA: Diagnosis not present

## 2022-05-06 DIAGNOSIS — R1312 Dysphagia, oropharyngeal phase: Secondary | ICD-10-CM | POA: Diagnosis not present

## 2022-05-06 DIAGNOSIS — R41841 Cognitive communication deficit: Secondary | ICD-10-CM | POA: Diagnosis not present

## 2022-05-06 DIAGNOSIS — M6281 Muscle weakness (generalized): Secondary | ICD-10-CM | POA: Diagnosis not present

## 2022-05-09 ENCOUNTER — Inpatient Hospital Stay (HOSPITAL_COMMUNITY)
Admission: EM | Admit: 2022-05-09 | Discharge: 2022-05-16 | DRG: 177 | Disposition: A | Discharge status: Skilled Nursing Facility | Payer: Medicare HMO | Attending: Internal Medicine | Admitting: Internal Medicine

## 2022-05-09 ENCOUNTER — Emergency Department (HOSPITAL_COMMUNITY): Payer: Medicare HMO

## 2022-05-09 ENCOUNTER — Encounter (HOSPITAL_COMMUNITY): Payer: Self-pay

## 2022-05-09 ENCOUNTER — Other Ambulatory Visit: Payer: Self-pay

## 2022-05-09 DIAGNOSIS — F0393 Unspecified dementia, unspecified severity, with mood disturbance: Secondary | ICD-10-CM | POA: Diagnosis not present

## 2022-05-09 DIAGNOSIS — Z853 Personal history of malignant neoplasm of breast: Secondary | ICD-10-CM

## 2022-05-09 DIAGNOSIS — R0603 Acute respiratory distress: Secondary | ICD-10-CM | POA: Diagnosis not present

## 2022-05-09 DIAGNOSIS — E639 Nutritional deficiency, unspecified: Secondary | ICD-10-CM | POA: Diagnosis not present

## 2022-05-09 DIAGNOSIS — E785 Hyperlipidemia, unspecified: Secondary | ICD-10-CM | POA: Diagnosis present

## 2022-05-09 DIAGNOSIS — J449 Chronic obstructive pulmonary disease, unspecified: Secondary | ICD-10-CM | POA: Diagnosis not present

## 2022-05-09 DIAGNOSIS — Z96652 Presence of left artificial knee joint: Secondary | ICD-10-CM | POA: Diagnosis not present

## 2022-05-09 DIAGNOSIS — R634 Abnormal weight loss: Secondary | ICD-10-CM | POA: Diagnosis not present

## 2022-05-09 DIAGNOSIS — J188 Other pneumonia, unspecified organism: Secondary | ICD-10-CM | POA: Diagnosis not present

## 2022-05-09 DIAGNOSIS — Z87891 Personal history of nicotine dependence: Secondary | ICD-10-CM | POA: Diagnosis not present

## 2022-05-09 DIAGNOSIS — Z8249 Family history of ischemic heart disease and other diseases of the circulatory system: Secondary | ICD-10-CM

## 2022-05-09 DIAGNOSIS — F32A Depression, unspecified: Secondary | ICD-10-CM | POA: Diagnosis present

## 2022-05-09 DIAGNOSIS — Z886 Allergy status to analgesic agent status: Secondary | ICD-10-CM

## 2022-05-09 DIAGNOSIS — R072 Precordial pain: Secondary | ICD-10-CM | POA: Diagnosis not present

## 2022-05-09 DIAGNOSIS — F0390 Unspecified dementia without behavioral disturbance: Secondary | ICD-10-CM | POA: Diagnosis not present

## 2022-05-09 DIAGNOSIS — K219 Gastro-esophageal reflux disease without esophagitis: Secondary | ICD-10-CM | POA: Diagnosis present

## 2022-05-09 DIAGNOSIS — Z66 Do not resuscitate: Secondary | ICD-10-CM | POA: Diagnosis not present

## 2022-05-09 DIAGNOSIS — R0602 Shortness of breath: Secondary | ICD-10-CM | POA: Diagnosis not present

## 2022-05-09 DIAGNOSIS — E538 Deficiency of other specified B group vitamins: Secondary | ICD-10-CM | POA: Diagnosis not present

## 2022-05-09 DIAGNOSIS — L89151 Pressure ulcer of sacral region, stage 1: Secondary | ICD-10-CM | POA: Diagnosis present

## 2022-05-09 DIAGNOSIS — J849 Interstitial pulmonary disease, unspecified: Secondary | ICD-10-CM | POA: Diagnosis present

## 2022-05-09 DIAGNOSIS — K224 Dyskinesia of esophagus: Secondary | ICD-10-CM | POA: Diagnosis not present

## 2022-05-09 DIAGNOSIS — J189 Pneumonia, unspecified organism: Secondary | ICD-10-CM | POA: Diagnosis not present

## 2022-05-09 DIAGNOSIS — J9601 Acute respiratory failure with hypoxia: Secondary | ICD-10-CM | POA: Diagnosis not present

## 2022-05-09 DIAGNOSIS — R918 Other nonspecific abnormal finding of lung field: Secondary | ICD-10-CM | POA: Diagnosis not present

## 2022-05-09 DIAGNOSIS — R1314 Dysphagia, pharyngoesophageal phase: Secondary | ICD-10-CM | POA: Diagnosis present

## 2022-05-09 DIAGNOSIS — R1310 Dysphagia, unspecified: Secondary | ICD-10-CM | POA: Diagnosis not present

## 2022-05-09 DIAGNOSIS — J690 Pneumonitis due to inhalation of food and vomit: Principal | ICD-10-CM | POA: Diagnosis present

## 2022-05-09 DIAGNOSIS — B59 Pneumocystosis: Secondary | ICD-10-CM | POA: Diagnosis not present

## 2022-05-09 DIAGNOSIS — Z79899 Other long term (current) drug therapy: Secondary | ICD-10-CM | POA: Diagnosis not present

## 2022-05-09 DIAGNOSIS — F419 Anxiety disorder, unspecified: Secondary | ICD-10-CM | POA: Diagnosis present

## 2022-05-09 DIAGNOSIS — R42 Dizziness and giddiness: Secondary | ICD-10-CM | POA: Diagnosis not present

## 2022-05-09 DIAGNOSIS — E871 Hypo-osmolality and hyponatremia: Secondary | ICD-10-CM | POA: Diagnosis not present

## 2022-05-09 DIAGNOSIS — J479 Bronchiectasis, uncomplicated: Secondary | ICD-10-CM | POA: Diagnosis present

## 2022-05-09 DIAGNOSIS — Z888 Allergy status to other drugs, medicaments and biological substances status: Secondary | ICD-10-CM

## 2022-05-09 DIAGNOSIS — D519 Vitamin B12 deficiency anemia, unspecified: Secondary | ICD-10-CM | POA: Diagnosis not present

## 2022-05-09 DIAGNOSIS — Z1152 Encounter for screening for COVID-19: Secondary | ICD-10-CM | POA: Diagnosis not present

## 2022-05-09 DIAGNOSIS — F339 Major depressive disorder, recurrent, unspecified: Secondary | ICD-10-CM | POA: Diagnosis present

## 2022-05-09 DIAGNOSIS — F02A4 Dementia in other diseases classified elsewhere, mild, with anxiety: Secondary | ICD-10-CM | POA: Diagnosis not present

## 2022-05-09 DIAGNOSIS — F03A4 Unspecified dementia, mild, with anxiety: Secondary | ICD-10-CM | POA: Diagnosis not present

## 2022-05-09 DIAGNOSIS — R531 Weakness: Secondary | ICD-10-CM | POA: Diagnosis not present

## 2022-05-09 DIAGNOSIS — Z743 Need for continuous supervision: Secondary | ICD-10-CM | POA: Diagnosis not present

## 2022-05-09 DIAGNOSIS — Z96643 Presence of artificial hip joint, bilateral: Secondary | ICD-10-CM | POA: Diagnosis present

## 2022-05-09 DIAGNOSIS — C50919 Malignant neoplasm of unspecified site of unspecified female breast: Secondary | ICD-10-CM | POA: Insufficient documentation

## 2022-05-09 DIAGNOSIS — Z96612 Presence of left artificial shoulder joint: Secondary | ICD-10-CM | POA: Diagnosis present

## 2022-05-09 DIAGNOSIS — I10 Essential (primary) hypertension: Secondary | ICD-10-CM | POA: Diagnosis not present

## 2022-05-09 DIAGNOSIS — R1312 Dysphagia, oropharyngeal phase: Secondary | ICD-10-CM | POA: Diagnosis not present

## 2022-05-09 DIAGNOSIS — Z7401 Bed confinement status: Secondary | ICD-10-CM | POA: Diagnosis not present

## 2022-05-09 LAB — COMPREHENSIVE METABOLIC PANEL
ALT: 18 U/L (ref 0–44)
AST: 26 U/L (ref 15–41)
Albumin: 2.7 g/dL — ABNORMAL LOW (ref 3.5–5.0)
Alkaline Phosphatase: 84 U/L (ref 38–126)
Anion gap: 9 (ref 5–15)
BUN: 15 mg/dL (ref 8–23)
CO2: 26 mmol/L (ref 22–32)
Calcium: 8.4 mg/dL — ABNORMAL LOW (ref 8.9–10.3)
Chloride: 96 mmol/L — ABNORMAL LOW (ref 98–111)
Creatinine, Ser: 0.8 mg/dL (ref 0.44–1.00)
GFR, Estimated: >60 mL/min (ref >60)
Glucose, Bld: 94 mg/dL (ref 70–99)
Potassium: 3.6 mmol/L (ref 3.5–5.1)
Sodium: 131 mmol/L — ABNORMAL LOW (ref 135–145)
Total Bilirubin: 0.9 mg/dL (ref 0.3–1.2)
Total Protein: 7.3 g/dL (ref 6.5–8.1)

## 2022-05-09 LAB — URINALYSIS, ROUTINE W REFLEX MICROSCOPIC
Bilirubin Urine: NEGATIVE
Glucose, UA: NEGATIVE mg/dL
Hgb urine dipstick: NEGATIVE
Ketones, ur: 5 mg/dL — AB
Nitrite: NEGATIVE
Protein, ur: NEGATIVE mg/dL
Specific Gravity, Urine: 1.011 (ref 1.005–1.030)
pH: 6 (ref 5.0–8.0)

## 2022-05-09 LAB — CBC WITH DIFFERENTIAL/PLATELET
Abs Immature Granulocytes: 0.04 10*3/uL (ref 0.00–0.07)
Basophils Absolute: 0.1 10*3/uL (ref 0.0–0.1)
Basophils Relative: 1 %
Eosinophils Absolute: 0.2 10*3/uL (ref 0.0–0.5)
Eosinophils Relative: 2 %
HCT: 39.8 % (ref 36.0–46.0)
Hemoglobin: 12.8 g/dL (ref 12.0–15.0)
Immature Granulocytes: 0 %
Lymphocytes Relative: 12 %
Lymphs Abs: 1.2 10*3/uL (ref 0.7–4.0)
MCH: 31.8 pg (ref 26.0–34.0)
MCHC: 32.2 g/dL (ref 30.0–36.0)
MCV: 99 fL (ref 80.0–100.0)
Monocytes Absolute: 0.7 10*3/uL (ref 0.1–1.0)
Monocytes Relative: 7 %
Neutro Abs: 7.9 10*3/uL — ABNORMAL HIGH (ref 1.7–7.7)
Neutrophils Relative %: 78 %
Platelets: 268 10*3/uL (ref 150–400)
RBC: 4.02 MIL/uL (ref 3.87–5.11)
RDW: 13.3 % (ref 11.5–15.5)
WBC: 10.1 10*3/uL (ref 4.0–10.5)
nRBC: 0 % (ref 0.0–0.2)

## 2022-05-09 LAB — BLOOD GAS, VENOUS
Acid-Base Excess: 6.9 mmol/L — ABNORMAL HIGH (ref 0.0–2.0)
Bicarbonate: 32 mmol/L — ABNORMAL HIGH (ref 20.0–28.0)
O2 Saturation: 21.6 %
Patient temperature: 37
pCO2, Ven: 46 mmHg (ref 44–60)
pH, Ven: 7.45 — ABNORMAL HIGH (ref 7.25–7.43)
pO2, Ven: <31 mmHg — CL (ref 32–45)

## 2022-05-09 LAB — LACTIC ACID, PLASMA: Lactic Acid, Venous: 1.7 mmol/L (ref 0.5–1.9)

## 2022-05-09 LAB — BRAIN NATRIURETIC PEPTIDE: B Natriuretic Peptide: 218 pg/mL — ABNORMAL HIGH (ref 0.0–100.0)

## 2022-05-09 MED ORDER — IPRATROPIUM BROMIDE 0.02 % IN SOLN: 0.5000 mg | Freq: Four times a day (QID) | RESPIRATORY_TRACT | Status: DC | PRN

## 2022-05-09 MED ORDER — LACTATED RINGERS IV BOLUS
500.0000 mL | Freq: Once | INTRAVENOUS | Status: AC
  Administered 2022-05-09: 500 mL via INTRAVENOUS

## 2022-05-09 MED ORDER — LISINOPRIL 5 MG PO TABS
5.0000 mg | ORAL_TABLET | Freq: Every day | ORAL | Status: DC
  Administered 2022-05-10: 5 mg via ORAL
  Filled 2022-05-09: qty 1

## 2022-05-09 MED ORDER — SODIUM CHLORIDE 0.9 % IV SOLN
INTRAVENOUS | Status: DC
Start: 2022-05-09 — End: 2022-05-09

## 2022-05-09 MED ORDER — GUAIFENESIN ER 600 MG PO TB12
600.0000 mg | ORAL_TABLET | Freq: Two times a day (BID) | ORAL | Status: DC
  Administered 2022-05-09 – 2022-05-10 (×2): 600 mg via ORAL
  Filled 2022-05-09 (×2): qty 1

## 2022-05-09 MED ORDER — SODIUM CHLORIDE 0.9 % IV SOLN
1.0000 g | INTRAVENOUS | Status: DC
  Administered 2022-05-09: 1 g via INTRAVENOUS
  Filled 2022-05-09: qty 10

## 2022-05-09 MED ORDER — ATORVASTATIN CALCIUM 20 MG PO TABS
20.0000 mg | ORAL_TABLET | Freq: Every day | ORAL | Status: DC
  Administered 2022-05-09 – 2022-05-15 (×7): 20 mg via ORAL
  Filled 2022-05-09 (×7): qty 1

## 2022-05-09 MED ORDER — ESCITALOPRAM OXALATE 20 MG PO TABS
20.0000 mg | ORAL_TABLET | Freq: Every morning | ORAL | Status: DC
  Administered 2022-05-10 – 2022-05-16 (×7): 20 mg via ORAL
  Filled 2022-05-09 (×7): qty 1

## 2022-05-09 MED ORDER — SODIUM CHLORIDE 0.9 % IV SOLN
2.0000 g | Freq: Two times a day (BID) | INTRAVENOUS | Status: DC
  Administered 2022-05-09 – 2022-05-12 (×6): 2 g via INTRAVENOUS
  Filled 2022-05-09 (×6): qty 12.5

## 2022-05-09 MED ORDER — HYDRALAZINE HCL 20 MG/ML IJ SOLN
10.0000 mg | Freq: Four times a day (QID) | INTRAMUSCULAR | Status: DC | PRN
  Administered 2022-05-12: 10 mg via INTRAVENOUS
  Filled 2022-05-09: qty 1

## 2022-05-09 MED ORDER — VANCOMYCIN HCL IN DEXTROSE 1-5 GM/200ML-% IV SOLN
1000.0000 mg | Freq: Once | INTRAVENOUS | Status: AC
  Administered 2022-05-09: 1000 mg via INTRAVENOUS
  Filled 2022-05-09: qty 200

## 2022-05-09 MED ORDER — POLYETHYLENE GLYCOL 3350 17 G PO PACK: 17.0000 g | PACK | Freq: Every day | ORAL | Status: DC | PRN

## 2022-05-09 MED ORDER — VANCOMYCIN HCL 750 MG/150ML IV SOLN: 750.0000 mg | INTRAVENOUS | Status: DC

## 2022-05-09 MED ORDER — ENOXAPARIN SODIUM 30 MG/0.3ML IJ SOSY: 30.0000 mg | PREFILLED_SYRINGE | INTRAMUSCULAR | Status: DC

## 2022-05-09 MED ORDER — PANTOPRAZOLE SODIUM 40 MG PO TBEC
40.0000 mg | DELAYED_RELEASE_TABLET | Freq: Every day | ORAL | Status: DC
  Administered 2022-05-10 – 2022-05-16 (×7): 40 mg via ORAL
  Filled 2022-05-09 (×7): qty 1

## 2022-05-09 MED ORDER — DIPHENOXYLATE-ATROPINE 2.5-0.025 MG PO TABS: 1.0000 | ORAL_TABLET | Freq: Four times a day (QID) | ORAL | Status: DC | PRN

## 2022-05-09 MED ORDER — METOPROLOL SUCCINATE ER 50 MG PO TB24
50.0000 mg | ORAL_TABLET | Freq: Every day | ORAL | Status: DC
  Administered 2022-05-09 – 2022-05-10 (×2): 50 mg via ORAL
  Filled 2022-05-09 (×2): qty 1

## 2022-05-09 MED ORDER — ENOXAPARIN SODIUM 40 MG/0.4ML IJ SOSY
40.0000 mg | PREFILLED_SYRINGE | INTRAMUSCULAR | Status: DC
  Administered 2022-05-09 – 2022-05-15 (×7): 40 mg via SUBCUTANEOUS
  Filled 2022-05-09 (×7): qty 0.4

## 2022-05-09 MED ORDER — IOHEXOL 350 MG/ML SOLN
75.0000 mL | Freq: Once | INTRAVENOUS | Status: AC | PRN
  Administered 2022-05-09: 75 mL via INTRAVENOUS

## 2022-05-09 NOTE — H&P (Signed)
History and Physical    [name] [name]:[id number] DOB: 1930-02-08 DOA: 05/09/2022  PCP: Rodrigo Ran, MD   Patient coming from: Friend's home  Chief Complaint: Shortness of breath  HPI: [name] [name] is a 89 y.o. female with medical history significant of hypertension, hyperlipidemia, breast cancer in remission, anxiety, COPD who was brought by her niece from friends home for follow-up of pneumonia.  Patient was diagnosed with pneumonia by her PCP about 3 to 4 weeks ago and was put on oral antibiotics.  Initially she was started on Augmentin which did not help.  Follow-up chest x-ray showed unimproved pneumonia and she was started on Levaquin.  Currently she is on her fifth day of the course.  Patient has being short of breath, weak mostly when she ambulates .  No report of fever or chills or cough.  Does not use oxygen at baseline.  Patient is also having poor oral intake.  Since patient's overall condition did not improve, she was brought to the emergency department. Patient seen and examined at bedside in the emergency department.  Niece was at the bedside.  She was put on supplemental oxygen at 2 L/min for comfort.  She was speaking on full sentences and appeared comfortable. No report of chest pain, cough, fever, chills, abdominal pain, nausea, vomiting, diarrhea.  ED Course: Remained mildly hypertensive in the ED.  Lab work showed BNP of 218, sodium of 131.  Chest imaging showed multifocal pneumonia/ILD like picture  Review of Systems: As per HPI otherwise 10 point review of systems negative.    Past Medical History:  Diagnosis Date   Anxiety    Bradycardia    asymptomatic   Breast CA 1993 OR 1994   LEFT, SURGERY AND RADIATION DONE   Breast cancer    COPD (chronic obstructive pulmonary disease)    MILD, NO INHALERS USED   DEGENERATIVE JOINT DISEASE, KNEE    Family history of anesthesia complication    NEPHEW NAUSEA/VOMITING   GENU VALGUM    Hyperlipidemia     Hypertension    Numbness of leg 2011   just left shin   OTHER ACQUIRED DEFORMITY OF ANKLE AND FOOT OTHER    Pneumonia 2013   X 2   PVC's (premature ventricular contractions)    ROTATOR CUFF SYNDROME, LEFT    CANNOT LIFT LEFT ARM ALL THE WAY UP   UNEQUAL LEG LENGTH    UTI (urinary tract infection)    STARTED AUGMENTIN  ON 09-10-13    Past Surgical History:  Procedure Laterality Date   APPENDECTOMY  AGE 35   BREAST LUMPECTOMY Left 1991   radiation   BREAST SURGERY Left 1993 OR 1994   LUMPECTOMY AND RADIATION DONE   JOINT REPLACEMENT     NASAL SINUS SURGERY  2006   REVERSE SHOULDER ARTHROPLASTY Left 07/31/2015   Procedure: LEFT REVERSE SHOULDER ARTHROPLASTY;  Surgeon: Beverely Low, MD;  Location: [facility] OR;  Service: Orthopedics;  Laterality: Left;   TOTAL HIP ARTHROPLASTY Bilateral LEFT 2008 AND RIGHT 2010   TOTAL KNEE ARTHROPLASTY Left 09/17/2013   Procedure: LEFT TOTAL KNEE ARTHROPLASTY;  Surgeon: Shelda Pal, MD;  Location: [facility] ORS;  Service: Orthopedics;  Laterality: Left;     reports that she quit smoking about 30 years ago. Her smoking use included cigarettes. She has a 3.75 pack-year smoking history. She has never been exposed to tobacco smoke. She has never used smokeless tobacco. She reports current alcohol use. She reports that she does not  use drugs.  Allergies  Allergen Reactions   Albuterol     Other reaction(s): had some rapid heartbeat in 2013 when she took it for some bronchitis.   Diltiazem     Other reaction(s): presyncope, bp went a bit low for bp on 180 mg dose   Naproxen     Other reaction(s): swelling   Nitrofurantoin     Other reaction(s): rash 9/18   Other     PAIN MEDICATION CAUSES NAUSEA--? name   Cardizem [Diltiazem Hcl] Other (See Comments)    hypotension    Family History  Problem Relation Age of Onset   Diabetes Mother    Heart Problems Father    Hypertension Brother      Prior to Admission medications   Medication Sig Start Date End Date  Taking? Authorizing Provider  atorvastatin (LIPITOR) 20 MG tablet Take 1 tablet by mouth daily.    [provider]  Cyanocobalamin (VITAMIN B12) 1000 MCG TBCR 1,000 mg daily.    [provider]  diphenoxylate-atropine (LOMOTIL) 2.5-0.025 MG per tablet Take 1 tablet by mouth 4 (four) times daily as needed for diarrhea or loose stools. 09/20/13   Reed, Tiffany L, DO  escitalopram (LEXAPRO) 10 MG tablet Take 10 mg by mouth every morning.    [provider]  esomeprazole (NEXIUM) 40 MG capsule 40 mg daily as needed.    [provider]  lisinopril (ZESTRIL) 5 MG tablet TAKE 1 TABLET BY MOUTH DAILY, YOU MAY TAKE 1 EXTRA TABLET DAILY AS NEEDED FOR HIGH BLOOD PRESSURE 03/17/21   Dyann Kief, PA-C  metoprolol succinate (TOPROL-XL) 50 MG 24 hr tablet Take 1 tablet (50 mg total) by mouth daily. Take with or immediately following a meal. Can take an additional tab as needed for heartrate over 100 or for palpitations 01/19/22   Quintella Reichert, MD  Probiotic Product (ALIGN) 4 MG CAPS Take 1 capsule by mouth daily.    [provider]    Physical Exam: Vitals:   05/09/22 1129 05/09/22 1435 05/09/22 1531  BP: (!) 121/92 (!) 144/109   Pulse: 89 66   Resp: 16 18   Temp: 97.7 F (36.5 C)  98.5 F (36.9 C)  TempSrc: Oral  Oral  SpO2: 100% 100%   Weight: 61.7 kg    Height:  (1.626 m)      Constitutional: NAD, calm, comfortable, very pleasant elderly female Vitals:   05/09/22 1129 05/09/22 1435 05/09/22 1531  BP: (!) 121/92 (!) 144/109   Pulse: 89 66   Resp: 16 18   Temp: 97.7 F (36.5 C)  98.5 F (36.9 C)  TempSrc: Oral  Oral  SpO2: 100% 100%   Weight: 61.7 kg    Height:  (1.626 m)     Eyes: PERRL, lids and conjunctivae normal ENMT: Mucous membranes are moist.  Neck: normal, supple, no masses, no thyromegaly Respiratory: Coarse breath sounds bilaterally, no wheezing or crackles. normal respiratory effort. No accessory muscle use.   Cardiovascular: Regular rate and rhythm, no murmurs / rubs / gallops. No extremity edema.  Abdomen: no tenderness, no masses palpated. No hepatosplenomegaly. Bowel sounds positive.  Musculoskeletal: no clubbing / cyanosis. No joint deformity upper and lower extremities.  Skin: no rashes, lesions, ulcers. No induration Neurologic: CN 2-12 grossly intact.  Strength 5/5 in all 4.  Psychiatric: Normal judgment and insight. Alert and oriented x 3. Normal mood.   Foley Catheter:None  Labs on Admission: I have personally reviewed following  labs and imaging studies  CBC: Recent Labs  Lab 05/09/22 1525  WBC 10.1  NEUTROABS 7.9*  HGB 12.8  HCT 39.8  MCV 99.0  PLT 268   Basic Metabolic Panel: Recent Labs  Lab 05/09/22 1205  NA 131*  K 3.6  CL 96*  CO2 26  GLUCOSE 94  BUN 15  CREATININE 0.80  CALCIUM 8.4*   GFR: Estimated Creatinine Clearance: 40.4 mL/min (by C-G formula based on SCr of 0.8 mg/dL). Liver Function Tests: Recent Labs  Lab 05/09/22 1205  AST 26  ALT 18  ALKPHOS 84  BILITOT 0.9  PROT 7.3  ALBUMIN 2.7*   No results for input(s): "LIPASE", "AMYLASE" in the last 168 hours. No results for input(s): "AMMONIA" in the last 168 hours. Coagulation Profile: No results for input(s): "INR", "PROTIME" in the last 168 hours. Cardiac Enzymes: No results for input(s): "CKTOTAL", "CKMB", "CKMBINDEX", "TROPONINI" in the last 168 hours. BNP (last 3 results) No results for input(s): "PROBNP" in the last 8760 hours. HbA1C: No results for input(s): "HGBA1C" in the last 72 hours. CBG: No results for input(s): "GLUCAP" in the last 168 hours. Lipid Profile: No results for input(s): "CHOL", "HDL", "LDLCALC", "TRIG", "CHOLHDL", "LDLDIRECT" in the last 72 hours. Thyroid Function Tests: No results for input(s): "TSH", "T4TOTAL", "FREET4", "T3FREE", "THYROIDAB" in the last 72 hours. Anemia Panel: No results for input(s): "VITAMINB12", "FOLATE", "FERRITIN", "TIBC", "IRON",  "RETICCTPCT" in the last 72 hours. Urine analysis:    Component Value Date/Time   COLORURINE YELLOW 05/09/2022 1345   APPEARANCEUR CLEAR 05/09/2022 1345   LABSPEC 1.011 05/09/2022 1345   PHURINE 6.0 05/09/2022 1345   GLUCOSEU NEGATIVE 05/09/2022 1345   HGBUR NEGATIVE 05/09/2022 1345   BILIRUBINUR NEGATIVE 05/09/2022 1345   KETONESUR 5 (A) 05/09/2022 1345   PROTEINUR NEGATIVE 05/09/2022 1345   UROBILINOGEN 1.0 02/18/2009 0923   NITRITE NEGATIVE 05/09/2022 1345   LEUKOCYTESUR LARGE (A) 05/09/2022 1345    Radiological Exams on Admission: CT Angio Chest PE W and/or Wo Contrast  Result Date: 05/09/2022 CLINICAL DATA:  Rule out pulmonary embolism.  History of pneumonia. EXAM: CT ANGIOGRAPHY CHEST WITH CONTRAST TECHNIQUE: Multidetector CT imaging of the chest was performed using the standard protocol during bolus administration of intravenous contrast. Multiplanar CT image reconstructions and MIPs were obtained to evaluate the vascular anatomy. RADIATION DOSE REDUCTION: This exam was performed according to the departmental dose-optimization program which includes automated exposure control, adjustment of the mA and/or kV according to patient size and/or use of iterative reconstruction technique. CONTRAST:  75mL OMNIPAQUE IOHEXOL 350 MG/ML SOLN COMPARISON:  10/04/16 FINDINGS: Cardiovascular: Satisfactory opacification of the pulmonary arteries to the segmental level. No evidence of pulmonary embolism. Multi chamber cardiac enlargement. Increase caliber of the main pulmonary artery compatible with PA hypertension. Aortic atherosclerosis and coronary artery calcifications. No pericardial effusion. Mediastinum/Nodes: No enlarged mediastinal, hilar, or axillary lymph nodes. Thyroid gland, trachea, and esophagus demonstrate no significant findings. Lungs/Pleura: There is no pneumothorax identified. No pleural effusion. Signs of chronic interstitial lung disease identified with bilateral peripheral predominant  and lower lung zone predominant interstitial reticulation, ground-glass attenuation, architectural distortion and bronchiectasis. Multifocal bilateral areas of consolidation change is identified. This is most severe within the posterolateral right upper lobe where there is a large geographic area of airspace consolidation measuring 8.0 x 3.9 cm, image 32/7. Additional, smaller areas of consolidative change identified within the posterior subpleural right lower lobe and subpleural medial right upper lobe the and periphery of the left upper lobe. Upper  Abdomen: No acute abnormality. Musculoskeletal: Multilevel spondylosis identified throughout the thoracic spine. Mild curvature of the thoracic spine is convex towards the right. No acute abnormality. Review of the MIP images confirms the above findings. IMPRESSION: 1. No evidence for acute pulmonary embolus. 2. Signs of chronic interstitial lung disease with bilateral peripheral predominant and lower lung zone predominant interstitial reticulation, ground-glass attenuation, architectural distortion and bronchiectasis. Consider follow-up imaging with high-resolution CT of the chest when the patient is current, acute clinical situation has resolved. 3. Multifocal bilateral areas of consolidation are identified. This is most severe within the posterolateral right upper lobe where there is a large geographic area of airspace consolidation measuring 8.0 x 3.9 cm. Findings are favored to represent multifocal pneumonia. Follow-up imaging is recommended to ensure resolution and to rule out underlying malignancy. 4. Multi chamber cardiac enlargement with coronary artery calcifications. 5. Increase caliber of the main pulmonary artery compatible with PA hypertension. 6.  Aortic Atherosclerosis (ICD10-I70.0). Electronically Signed   By: Signa Kell M.D.   On: 05/09/2022 14:31   DG Chest 2 View  Result Date: 05/09/2022 CLINICAL DATA:  Shortness of breath. Personal history  of breast cancer. EXAM: CHEST - 2 VIEW COMPARISON:  09/11/2013 FINDINGS: Lungs are hyperexpanded. Diffuse interstitial opacity is progressive in the interval with right greater than left patchy relatively diffuse bilateral airspace disease showing nodular configuration in some regions. No pleural effusion. The cardiopericardial silhouette is within normal limits for size. Status post left shoulder arthroplasty. IMPRESSION: Interval progression of diffuse interstitial opacity with right greater than left patchy relatively diffuse bilateral airspace disease showing nodular configuration in some regions. Findings could reflect multifocal pneumonia although given the history of breast cancer, metastatic disease not excluded. CT chest may prove helpful to further evaluate. Electronically Signed   By: Kennith Center M.D.   On: 05/09/2022 12:32     Assessment/Plan Principal Problem:   PNA (pneumonia) Active Problems:   Hypertension   Depression   COPD (chronic obstructive pulmonary disease)   Breast cancer  Multifocal pneumonia: Patient was recently diagnosed with community-acquired pneumonia by her PCP.  Treated with azithromycin without benefit so antibiotics changed to Levaquin.  She is on fifth day of the course.  Patient did not improve symptomatically. No fever or chills.  No leukocytosis. Lactate level normal.  Will check procalcitonin.  Will check COVID screening. Patient has been started on broad-spectrum antibiotics vancomycin and cefepime.  Will follow-up cultures.  Will get a sputum culture, Legionella/streptococcal urine antigen. Currently she is saturating fine on room air.  She was on Supplemental oxygen just for comfort Chest imaging showed features of ILD, multifocal bilateral consolidations.  No pulmonary embolism. Continue incentive spirometer, bronchodilators as needed, mucolytics. Continue antibiotics for now.  Will consider pulmonary consultation if she does not improve with  antibiotics.  Needs to rule out ILD.  Hypertension: Blood pressure is up in the emergency department.  Continue prn medication for severe hypertension continue home medication: Metoprolol, lisinopril  History of COPD: Continue bronchodilators as needed.  Does not use supplemental oxygen at home.  Past smoker  Dysphagia: As per the niece, she is having problem with swallowing recently.  Speech therapy consulted.  Hyperlipidemia: Takes Lipitor  History of depression: On Lexapro.  History of left breast cancer: Currently in remission  Elevated BNP: Will check echocardiogram.  Features of ILD in the lung imagings  History of GERD: Continue Protonix  Goals of care: Elderly female with multifocal pneumonia.  CODE STATUS is DNR.  Confirmed with the niece     Severity of Illness: The appropriate patient status for this patient is INPATIENT.   DVT prophylaxis: Lovenox Code Status: Full Family Communication: Niece at bedside Consults called: Nonek.     Burnadette Pop MD [facility]  05/09/2022, 5:27 PM

## 2022-05-09 NOTE — ED Provider Notes (Signed)
[place] EMERGENCY DEPARTMENT AT [facility] Provider Note   CSN: [id number] Arrival date & time: 05/09/22  1115     History  Chief Complaint  Patient presents with   Follow-up    Pt had pneumonia. Complete course of antibiotic. Daughter wants to have patient recheck, Denies any acute distress or worsening of symptoms    [name] [name] is a 89 y.o. female with past medical history hypertension, hyperlipidemia, breast cancer in remission, anxiety, COPD, dementia who presents to the ED from friend's home for further evaluation.  Niece is also at bedside providing history.  Patient has been ill for approximately 3 weeks and was diagnosed with pneumonia earlier this month.  She completed a course of Augmentin without improvement.  She had a follow-up chest x-ray last week which showed a continued, unimproved pneumonia and was started on Levaquin.  She is on day 5 of this.  Underlying history of COPD that is very well-controlled.  No previous hospitalizations for this.  Patient does not use home oxygen at baseline.  Patient/niece states that patient has been generally weak and short of breath since onset of symptoms.  Patient has not wanted to eat very much and has been getting weaker.  States that patient eats typically only a yogurt and Ensure daily which is abnormal for her.  No vomiting or diarrhea.  Patient denies chest pain.  No recent fevers.  Patient denies cough or congestion.      Home Medications Prior to Admission medications   Medication Sig Start Date End Date Taking? Authorizing Provider  atorvastatin (LIPITOR) 20 MG tablet Take 1 tablet by mouth daily.    [provider]  Cyanocobalamin (VITAMIN B12) 1000 MCG TBCR 1,000 mg daily.    [provider]  diphenoxylate-atropine (LOMOTIL) 2.5-0.025 MG per tablet Take 1 tablet by mouth 4 (four) times daily as needed for diarrhea or loose stools. 09/20/13   Reed, Tiffany L, DO  escitalopram (LEXAPRO) 10 MG  tablet Take 10 mg by mouth every morning.    [provider]  esomeprazole (NEXIUM) 40 MG capsule 40 mg daily as needed.    [provider]  lisinopril (ZESTRIL) 5 MG tablet TAKE 1 TABLET BY MOUTH DAILY, YOU MAY TAKE 1 EXTRA TABLET DAILY AS NEEDED FOR HIGH BLOOD PRESSURE 03/17/21   Dyann Kief, PA-C  metoprolol succinate (TOPROL-XL) 50 MG 24 hr tablet Take 1 tablet (50 mg total) by mouth daily. Take with or immediately following a meal. Can take an additional tab as needed for heartrate over 100 or for palpitations 01/19/22   Quintella Reichert, MD  Probiotic Product (ALIGN) 4 MG CAPS Take 1 capsule by mouth daily.    [provider]      Allergies    Albuterol, Diltiazem, Naproxen, Nitrofurantoin, Other, and Cardizem [diltiazem hcl]    Review of Systems   Review of Systems  All other systems reviewed and are negative.   Physical Exam Updated Vital Signs BP (!) 144/109   Pulse 66   Temp 98.5 F (36.9 C) (Oral)   Resp 18   Ht 5\' 4"  (1.626 m)   Wt 61.7 kg   SpO2 100%   BMI 23.35 kg/m  Physical Exam Vitals and nursing note reviewed.  Constitutional:      General: She is not in acute distress.    Appearance: Normal appearance. She is not ill-appearing, toxic-appearing or diaphoretic.  HENT:     Head: Normocephalic and atraumatic.  Nose: Nose normal.     Mouth/Throat:     Mouth: Mucous membranes are dry.  Eyes:     General: No scleral icterus.    Extraocular Movements: Extraocular movements intact.     Conjunctiva/sclera: Conjunctivae normal.  Cardiovascular:     Rate and Rhythm: Normal rate and regular rhythm.     Heart sounds: No murmur heard. Pulmonary:     Effort: Pulmonary effort is normal. No respiratory distress.     Breath sounds: No stridor. Wheezing (minimal to right upper lung) and rhonchi (minimal to right upper lung) present. No rales.     Comments: Speaking in full sentences with ease, normal phonation Chest:     Chest wall: No  tenderness.  Abdominal:     General: Abdomen is flat. There is no distension.     Palpations: Abdomen is soft.     Tenderness: There is no abdominal tenderness. There is no right CVA tenderness, left CVA tenderness, guarding or rebound.  Musculoskeletal:        General: Normal range of motion.     Cervical back: Normal range of motion and neck supple. No rigidity.     Right lower leg: No edema.     Left lower leg: No edema.  Skin:    General: Skin is warm and dry.     Capillary Refill: Capillary refill takes less than 2 seconds.     Coloration: Skin is not jaundiced or pale.     Findings: No rash.  Neurological:     General: No focal deficit present.     Mental Status: She is alert.     GCS: GCS eye subscore is 4. GCS verbal subscore is 5. GCS motor subscore is 6.     Cranial Nerves: Cranial nerves 2-12 are intact. No cranial nerve deficit, dysarthria or facial asymmetry.     Motor: Motor function is intact. No tremor or atrophy.     Comments: 5/5 strength to bilateral UE and LE  Psychiatric:        Mood and Affect: Mood normal.        Speech: Speech normal.        Behavior: Behavior normal. Behavior is cooperative.        Thought Content: Thought content normal.     ED Results / Procedures / Treatments   Labs (all labs ordered are listed, but only abnormal results are displayed) Labs Reviewed  URINALYSIS, ROUTINE W REFLEX MICROSCOPIC - Abnormal; Notable for the following components:      Result Value   Ketones, ur 5 (*)    Leukocytes,Ua LARGE (*)    Bacteria, UA RARE (*)    Non Squamous Epithelial 0-5 (*)    All other components within normal limits  COMPREHENSIVE METABOLIC PANEL - Abnormal; Notable for the following components:   Sodium 131 (*)    Chloride 96 (*)    Calcium 8.4 (*)    Albumin 2.7 (*)    All other components within normal limits  BRAIN NATRIURETIC PEPTIDE - Abnormal; Notable for the following components:   B Natriuretic Peptide 218.0 (*)    All other  components within normal limits  BLOOD GAS, VENOUS - Abnormal; Notable for the following components:   pH, Ven 7.45 (*)    pO2, Ven <31 (*)    Bicarbonate 32.0 (*)    Acid-Base Excess 6.9 (*)    All other components within normal limits  CULTURE, BLOOD (ROUTINE X 2)  CULTURE, BLOOD (ROUTINE  X 2)  LACTIC ACID, PLASMA  CBC WITH DIFFERENTIAL/PLATELET    EKG EKG Interpretation  Date/Time:  Monday May 09 2022 15:26:03 EDT Ventricular Rate:  62 PR Interval:  152 QRS Duration: 112 QT Interval:  544 QTC Calculation: 557 R Axis:   -40 Text Interpretation: Sinus rhythm  Poor baseline Confirmed by Kristine Royal [phone]) on 05/09/2022 3:28:43 PM  Radiology CT Angio Chest PE W and/or Wo Contrast  Result Date: 05/09/2022 CLINICAL DATA:  Rule out pulmonary embolism.  History of pneumonia. EXAM: CT ANGIOGRAPHY CHEST WITH CONTRAST TECHNIQUE: Multidetector CT imaging of the chest was performed using the standard protocol during bolus administration of intravenous contrast. Multiplanar CT image reconstructions and MIPs were obtained to evaluate the vascular anatomy. RADIATION DOSE REDUCTION: This exam was performed according to the departmental dose-optimization program which includes automated exposure control, adjustment of the mA and/or kV according to patient size and/or use of iterative reconstruction technique. CONTRAST:  75mL OMNIPAQUE IOHEXOL 350 MG/ML SOLN COMPARISON:  10/04/16 FINDINGS: Cardiovascular: Satisfactory opacification of the pulmonary arteries to the segmental level. No evidence of pulmonary embolism. Multi chamber cardiac enlargement. Increase caliber of the main pulmonary artery compatible with PA hypertension. Aortic atherosclerosis and coronary artery calcifications. No pericardial effusion. Mediastinum/Nodes: No enlarged mediastinal, hilar, or axillary lymph nodes. Thyroid gland, trachea, and esophagus demonstrate no significant findings. Lungs/Pleura: There is no pneumothorax  identified. No pleural effusion. Signs of chronic interstitial lung disease identified with bilateral peripheral predominant and lower lung zone predominant interstitial reticulation, ground-glass attenuation, architectural distortion and bronchiectasis. Multifocal bilateral areas of consolidation change is identified. This is most severe within the posterolateral right upper lobe where there is a large geographic area of airspace consolidation measuring 8.0 x 3.9 cm, image 32/7. Additional, smaller areas of consolidative change identified within the posterior subpleural right lower lobe and subpleural medial right upper lobe the and periphery of the left upper lobe. Upper Abdomen: No acute abnormality. Musculoskeletal: Multilevel spondylosis identified throughout the thoracic spine. Mild curvature of the thoracic spine is convex towards the right. No acute abnormality. Review of the MIP images confirms the above findings. IMPRESSION: 1. No evidence for acute pulmonary embolus. 2. Signs of chronic interstitial lung disease with bilateral peripheral predominant and lower lung zone predominant interstitial reticulation, ground-glass attenuation, architectural distortion and bronchiectasis. Consider follow-up imaging with high-resolution CT of the chest when the patient is current, acute clinical situation has resolved. 3. Multifocal bilateral areas of consolidation are identified. This is most severe within the posterolateral right upper lobe where there is a large geographic area of airspace consolidation measuring 8.0 x 3.9 cm. Findings are favored to represent multifocal pneumonia. Follow-up imaging is recommended to ensure resolution and to rule out underlying malignancy. 4. Multi chamber cardiac enlargement with coronary artery calcifications. 5. Increase caliber of the main pulmonary artery compatible with PA hypertension. 6.  Aortic Atherosclerosis (ICD10-I70.0). Electronically Signed   By: Signa Kell M.D.    On: 05/09/2022 14:31   DG Chest 2 View  Result Date: 05/09/2022 CLINICAL DATA:  Shortness of breath. Personal history of breast cancer. EXAM: CHEST - 2 VIEW COMPARISON:  09/11/2013 FINDINGS: Lungs are hyperexpanded. Diffuse interstitial opacity is progressive in the interval with right greater than left patchy relatively diffuse bilateral airspace disease showing nodular configuration in some regions. No pleural effusion. The cardiopericardial silhouette is within normal limits for size. Status post left shoulder arthroplasty. IMPRESSION: Interval progression of diffuse interstitial opacity with right greater than left patchy relatively diffuse bilateral  airspace disease showing nodular configuration in some regions. Findings could reflect multifocal pneumonia although given the history of breast cancer, metastatic disease not excluded. CT chest may prove helpful to further evaluate. Electronically Signed   By: Kennith Center M.D.   On: 05/09/2022 12:32    Procedures Procedures    Medications Ordered in ED Medications  cefTRIAXone (ROCEPHIN) 1 g in sodium chloride 0.9 % 100 mL IVPB (1 g Intravenous New Bag/Given 05/09/22 1537)  lactated ringers bolus 500 mL (0 mLs Intravenous Stopped 05/09/22 1300)  iohexol (OMNIPAQUE) 350 MG/ML injection 75 mL (75 mLs Intravenous Contrast Given 05/09/22 1358)    ED Course/ Medical Decision Making/ A&P                             Medical Decision Making Amount and/or Complexity of Data Reviewed Labs: ordered. Decision-making details documented in ED Course. Radiology: ordered. Decision-making details documented in ED Course. ECG/medicine tests: ordered. Decision-making details documented in ED Course.  Risk Prescription drug management. Decision regarding hospitalization.   Medical Decision Making:   NATALEA SUTLIFF is a 89 y.o. female who presented to the ED today with dyspnea detailed above.    Additional history discussed with patient's  family/caregivers.  Patient's presentation is complicated by their history of advanced age, COPD, HTN, HLD.  Patient placed on continuous vitals and telemetry monitoring while in ED which was reviewed periodically.  Complete initial physical exam performed, notably the patient was in no acute distress.    Reviewed and confirmed nursing documentation for past medical history, family history, social history.    Initial Assessment:   With the patient's presentation, differential diagnosis includes but is not limited to Cardiac (AHF, pericardial effusion and tamponade, arrhythmias, ischemia, etc) Respiratory (COPD, asthma, pneumonia, pneumothorax, primary pulmonary hypertension, PE/VQ mismatch) Hematological (anemia) Neuromuscular (ALS, Guillain-Barr, etc)  This is most consistent with an acute complicated illness  Initial Plan:  Screening labs including CBC and Metabolic panel to evaluate for infectious or metabolic etiology of disease.  Urinalysis with reflex culture ordered to evaluate for UTI or relevant urologic/nephrologic pathology.  CXR to evaluate for structural/infectious intrathoracic pathology.  EKG to evaluate for cardiac pathology BNP to assess for heart failure Blood gas to assess for acid base disturbance Blood cultures Objective evaluation as below reviewed   Initial Study Results:   Laboratory  All laboratory results reviewed without evidence of clinically relevant pathology.   Exceptions include: pH 7.45, Na 131, BNP 218   EKG EKG was reviewed independently. Rate, rhythm, axis, intervals all examined and without medically relevant abnormality. ST segments without concerns for elevations.    Radiology:  All images reviewed independently. Agree with radiology report at this time.   CT Angio Chest PE W and/or Wo Contrast  Result Date: 05/09/2022 CLINICAL DATA:  Rule out pulmonary embolism.  History of pneumonia. EXAM: CT ANGIOGRAPHY CHEST WITH CONTRAST TECHNIQUE:  Multidetector CT imaging of the chest was performed using the standard protocol during bolus administration of intravenous contrast. Multiplanar CT image reconstructions and MIPs were obtained to evaluate the vascular anatomy. RADIATION DOSE REDUCTION: This exam was performed according to the departmental dose-optimization program which includes automated exposure control, adjustment of the mA and/or kV according to patient size and/or use of iterative reconstruction technique. CONTRAST:  75mL OMNIPAQUE IOHEXOL 350 MG/ML SOLN COMPARISON:  10/04/16 FINDINGS: Cardiovascular: Satisfactory opacification of the pulmonary arteries to the segmental level. No evidence of pulmonary embolism. Multi  chamber cardiac enlargement. Increase caliber of the main pulmonary artery compatible with PA hypertension. Aortic atherosclerosis and coronary artery calcifications. No pericardial effusion. Mediastinum/Nodes: No enlarged mediastinal, hilar, or axillary lymph nodes. Thyroid gland, trachea, and esophagus demonstrate no significant findings. Lungs/Pleura: There is no pneumothorax identified. No pleural effusion. Signs of chronic interstitial lung disease identified with bilateral peripheral predominant and lower lung zone predominant interstitial reticulation, ground-glass attenuation, architectural distortion and bronchiectasis. Multifocal bilateral areas of consolidation change is identified. This is most severe within the posterolateral right upper lobe where there is a large geographic area of airspace consolidation measuring 8.0 x 3.9 cm, image 32/7. Additional, smaller areas of consolidative change identified within the posterior subpleural right lower lobe and subpleural medial right upper lobe the and periphery of the left upper lobe. Upper Abdomen: No acute abnormality. Musculoskeletal: Multilevel spondylosis identified throughout the thoracic spine. Mild curvature of the thoracic spine is convex towards the right. No acute  abnormality. Review of the MIP images confirms the above findings. IMPRESSION: 1. No evidence for acute pulmonary embolus. 2. Signs of chronic interstitial lung disease with bilateral peripheral predominant and lower lung zone predominant interstitial reticulation, ground-glass attenuation, architectural distortion and bronchiectasis. Consider follow-up imaging with high-resolution CT of the chest when the patient is current, acute clinical situation has resolved. 3. Multifocal bilateral areas of consolidation are identified. This is most severe within the posterolateral right upper lobe where there is a large geographic area of airspace consolidation measuring 8.0 x 3.9 cm. Findings are favored to represent multifocal pneumonia. Follow-up imaging is recommended to ensure resolution and to rule out underlying malignancy. 4. Multi chamber cardiac enlargement with coronary artery calcifications. 5. Increase caliber of the main pulmonary artery compatible with PA hypertension. 6.  Aortic Atherosclerosis (ICD10-I70.0). Electronically Signed   By: Signa Kell M.D.   On: 05/09/2022 14:31   DG Chest 2 View  Result Date: 05/09/2022 CLINICAL DATA:  Shortness of breath. Personal history of breast cancer. EXAM: CHEST - 2 VIEW COMPARISON:  09/11/2013 FINDINGS: Lungs are hyperexpanded. Diffuse interstitial opacity is progressive in the interval with right greater than left patchy relatively diffuse bilateral airspace disease showing nodular configuration in some regions. No pleural effusion. The cardiopericardial silhouette is within normal limits for size. Status post left shoulder arthroplasty. IMPRESSION: Interval progression of diffuse interstitial opacity with right greater than left patchy relatively diffuse bilateral airspace disease showing nodular configuration in some regions. Findings could reflect multifocal pneumonia although given the history of breast cancer, metastatic disease not excluded. CT chest may  prove helpful to further evaluate. Electronically Signed   By: Kennith Center M.D.   On: 05/09/2022 12:32      Consults: Case discussed with Dr. Robb Matar, HPS, missing CBC--will get this, re-consult hospital medicine and admit. Will get blood cultures and start antibiotics in the meantime.   Final Assessment and Plan:   89 year old female presenting to ED for evaluation of dyspnea. Diagnosed with pneumonia outpatient, completed Augmentin course, on day 5 of Levaquin with no improvement. Hx COPD well-controlled without routine or as needed medications required. No oxygen requirement at baseline. CXR with extensive disease difficult to evaluate so CT obtained for further assessment with showed advanced COPD, multifocal pneumonia. Minimally elevated BNP. Pt without LE edema, does not appear clinically volume overloaded. No respiratory distress, no tachypnea or tachycardia. Placed on [place] O2 by EMS/nursing staff who will attempt to wean off. Blood cultures ordered and antibiotics to be started. Pending CBC and consult  to medicine for admission. Care transitioned to [facility] pending remainder of labs, disposition. Pt stable at time of care transition.     Clinical Impression:  1. Multifocal pneumonia      Admit         Final Clinical Impression(s) / ED Diagnoses Final diagnoses:  Multifocal pneumonia    Rx / DC Orders ED Discharge Orders     None         Tonette Lederer, PA-C 05/09/22 1551    Wynetta Fines, MD 05/09/22 [phone]

## 2022-05-09 NOTE — Progress Notes (Signed)
Pharmacy Antibiotic Note  [name] [name] is a 89 y.o. female admitted on 05/09/2022 with pneumonia.  Pharmacy has been consulted for Cefepime + Vancomycin dosing.  *Has not improved on outpatient courses of azithromycin followed by levofloxacin.    Plan: Cefepime 2gm IV q12h Vancomycin 1gm IV x1 then  IV q24h to target AUC 400-550.  Estimated AUC on this regimen =445. Check MRSA PCR and consider d/c Vancomycin if negative Monitor renal function and cx data    Height:  (162.6 cm) Weight: 61.7 kg (136 lb 0.4 oz) IBW/kg (Calculated) : 54.7  Temp (24hrs), Avg:98.1 F (36.7 C), Min:97.7 F (36.5 C), Max:98.5 F (36.9 C)  Recent Labs  Lab 05/09/22 1205 05/09/22 1303 05/09/22 1525  WBC  --   --  10.1  CREATININE 0.80  --   --   LATICACIDVEN  --  1.7  --     Estimated Creatinine Clearance: 40.4 mL/min (by C-G formula based on SCr of 0.8 mg/dL).    Allergies  Allergen Reactions   Albuterol     Other reaction(s): had some rapid heartbeat in 2013 when she took it for some bronchitis.   Diltiazem     Other reaction(s): presyncope, bp went a bit low for bp on 180 mg dose   Naproxen     Other reaction(s): swelling   Nitrofurantoin     Other reaction(s): rash 9/18   Other     PAIN MEDICATION CAUSES NAUSEA--? name   Cardizem [Diltiazem Hcl] Other (See Comments)    hypotension    Antimicrobials this admission: 4/22 Cefepime >>  4/22 Vancomycin >>  4/22 Rocephin x1 in ED  Dose adjustments this admission:  Microbiology results: 4/22 BCx:  Sputum:   MRSA PCR:   Thank you for allowing pharmacy to be a part of this patient's care.  Junita Push PharmD 05/09/2022 5:37 PM

## 2022-05-09 NOTE — ED Notes (Signed)
ED TO INPATIENT HANDOFF REPORT  Name/Age/Gender [name] [name] 89 y.o. female  Code Status    Code Status Orders  (From admission, onward)           Start     Ordered   05/09/22 1750  Do not attempt resuscitation (DNR)  Continuous       Question Answer Comment  If patient has no pulse and is not breathing Do Not Attempt Resuscitation   If patient has a pulse and/or is breathing: Medical Treatment Goals LIMITED ADDITIONAL INTERVENTIONS: Use medication/IV fluids and cardiac monitoring as indicated; Do not use intubation or mechanical ventilation (DNI), also provide comfort medications.  Transfer to Progressive/Stepdown as indicated, avoid Intensive Care.   Consent: Discussion documented in EHR or advanced directives reviewed      05/09/22 1750           Code Status History     Date Active Date Inactive Code Status Order ID Comments User Context   05/09/2022 1722 05/09/2022 1750 Full Code [id number]  Burnadette Pop, MD ED   07/31/2015 1404 08/01/2015 1948 Full Code [id number]  Beverely Low, MD Inpatient   09/17/2013 1725 09/19/2013 1557 Full Code [id number]  Babish, Genelle Gather, PA-C Inpatient       Home/SNF/Other Home  Chief Complaint PNA (pneumonia) [J18.9]  Level of Care/Admitting Diagnosis ED Disposition     ED Disposition  Admit   Condition  --   Comment  Hospital Area: [facility] [[id number]]  Level of Care: Telemetry [5]  Admit to tele based on following criteria: Complex arrhythmia (Bradycardia/Tachycardia)  May admit patient to [facility] or [facility] if equivalent level of care is available:: No  Covid Evaluation: Confirmed COVID Negative  Diagnosis: PNA (pneumonia) [295284]  Admitting Physician: Burnadette Pop [[id number]]  Attending Physician: Burnadette Pop [[id number]]  Certification:: I certify this patient will need inpatient services for at least 2 midnights          Medical History Past Medical History:  Diagnosis Date    Anxiety    Bradycardia    asymptomatic   Breast CA 1993 OR 1994   LEFT, SURGERY AND RADIATION DONE   Breast cancer    COPD (chronic obstructive pulmonary disease)    MILD, NO INHALERS USED   DEGENERATIVE JOINT DISEASE, KNEE    Family history of anesthesia complication    NEPHEW NAUSEA/VOMITING   GENU VALGUM    Hyperlipidemia    Hypertension    Numbness of leg 2011   just left shin   OTHER ACQUIRED DEFORMITY OF ANKLE AND FOOT OTHER    Pneumonia 2013   X 2   PVC's (premature ventricular contractions)    ROTATOR CUFF SYNDROME, LEFT    CANNOT LIFT LEFT ARM ALL THE WAY UP   UNEQUAL LEG LENGTH    UTI (urinary tract infection)    STARTED AUGMENTIN  ON 09-10-13    Allergies Allergies  Allergen Reactions   Albuterol     Other reaction(s): had some rapid heartbeat in 2013 when she took it for some bronchitis.   Diltiazem     Other reaction(s): presyncope, bp went a bit low for bp on 180 mg dose   Naproxen     Other reaction(s): swelling   Nitrofurantoin     Other reaction(s): rash 9/18   Other     PAIN MEDICATION CAUSES NAUSEA--? name   Cardizem [Diltiazem Hcl] Other (See Comments)    hypotension    IV Location/Drains/Wounds Patient Lines/Drains/Airways  Status     Active Line/Drains/Airways     Name Placement date Placement time Site Days   Peripheral IV 05/09/22 20 G Anterior;Proximal;Right Forearm 05/09/22  1230  Forearm  less than 1   Incision (Closed) 09/17/13 Leg Left 09/17/13  1428  -- 3156   Incision (Closed) 07/31/15 Shoulder Left 07/31/15  1014  -- 2474            Labs/Imaging Results for orders placed or performed during the hospital encounter of 05/09/22 (from the past 48 hour(s))  Comprehensive metabolic panel     Status: Abnormal   Collection Time: 05/09/22 12:05 PM  Result Value Ref Range   Sodium 131 (L) 135 - 145 mmol/L   Potassium 3.6 3.5 - 5.1 mmol/L   Chloride 96 (L) 98 - 111 mmol/L   CO2 26 22 - 32 mmol/L   Glucose, Bld 94 70 - 99 mg/dL     Comment: Glucose reference range applies only to samples taken after fasting for at least 8 hours.   BUN 15 8 - 23 mg/dL   Creatinine, Ser 1.19 0.44 - 1.00 mg/dL   Calcium 8.4 (L) 8.9 - 10.3 mg/dL   Total Protein 7.3 6.5 - 8.1 g/dL   Albumin 2.7 (L) 3.5 - 5.0 g/dL   AST 26 15 - 41 U/L   ALT 18 0 - 44 U/L   Alkaline Phosphatase 84 38 - 126 U/L   Total Bilirubin 0.9 0.3 - 1.2 mg/dL   GFR, Estimated >14 >78 mL/min    Comment: (NOTE) Calculated using the CKD-EPI Creatinine Equation (2021)    Anion gap 9 5 - 15    Comment: Performed at [facility], [address], [place], [place] [address]  Brain natriuretic peptide     Status: Abnormal   Collection Time: 05/09/22 12:05 PM  Result Value Ref Range   B Natriuretic Peptide 218.0 (H) 0.0 - 100.0 pg/mL    Comment: Performed at [facility], [address], [place], [place] [address]  Blood gas, venous (at [facility])     Status: Abnormal   Collection Time: 05/09/22 12:05 PM  Result Value Ref Range   pH, Ven 7.45 (H) 7.25 - 7.43   pCO2, Ven 46 44 - 60 mmHg   pO2, Ven <31 (LL) 32 - 45 mmHg    Comment: CRITICAL RESULT CALLED TO, READ BACK BY AND VERIFIED WITH: RN P DOWD AT 1220 05/09/22 CRUICKSHANK A    Bicarbonate 32.0 (H) 20.0 - 28.0 mmol/L   Acid-Base Excess 6.9 (H) 0.0 - 2.0 mmol/L   O2 Saturation 21.6 %   Patient temperature 37.0     Comment: Performed at [facility], [address], [place], [place] [address]  Lactic acid, plasma     Status: None   Collection Time: 05/09/22  1:03 PM  Result Value Ref Range   Lactic Acid, Venous 1.7 0.5 - 1.9 mmol/L    Comment: Performed at [facility], [address] [place], [place] [address]  Urinalysis, Routine w reflex microscopic -Urine, Clean Catch     Status: Abnormal   Collection Time: 05/09/22  1:45 PM  Result Value Ref Range   Color, Urine YELLOW YELLOW   APPearance CLEAR CLEAR   Specific Gravity, Urine  1.011 1.005 - 1.030   pH 6.0 5.0 - 8.0   Glucose, UA NEGATIVE NEGATIVE mg/dL   Hgb urine dipstick NEGATIVE NEGATIVE   Bilirubin Urine NEGATIVE NEGATIVE  Ketones, ur 5 (A) NEGATIVE mg/dL   Protein, ur NEGATIVE NEGATIVE mg/dL   Nitrite NEGATIVE NEGATIVE   Leukocytes,Ua LARGE (A) NEGATIVE   RBC / HPF 0-5 0 - 5 RBC/hpf   WBC, UA 21-50 0 - 5 WBC/hpf   Bacteria, UA RARE (A) NONE SEEN   Squamous Epithelial / HPF 0-5 0 - 5 /HPF   Mucus PRESENT    Non Squamous Epithelial 0-5 (A) NONE SEEN    Comment: Performed at [facility], [address] [place], [place] [address]  CBC with Differential     Status: Abnormal   Collection Time: 05/09/22  3:25 PM  Result Value Ref Range   WBC 10.1 4.0 - 10.5 K/uL   RBC 4.02 3.87 - 5.11 MIL/uL   Hemoglobin 12.8 12.0 - 15.0 g/dL   HCT 62.1 30.8 - 65.7 %   MCV 99.0 80.0 - 100.0 fL   MCH 31.8 26.0 - 34.0 pg   MCHC 32.2 30.0 - 36.0 g/dL   RDW 84.6 96.2 - 95.2 %   Platelets 268 150 - 400 K/uL   nRBC 0.0 0.0 - 0.2 %   Neutrophils Relative % 78 %   Neutro Abs 7.9 (H) 1.7 - 7.7 K/uL   Lymphocytes Relative 12 %   Lymphs Abs 1.2 0.7 - 4.0 K/uL   Monocytes Relative 7 %   Monocytes Absolute 0.7 0.1 - 1.0 K/uL   Eosinophils Relative 2 %   Eosinophils Absolute 0.2 0.0 - 0.5 K/uL   Basophils Relative 1 %   Basophils Absolute 0.1 0.0 - 0.1 K/uL   Immature Granulocytes 0 %   Abs Immature Granulocytes 0.04 0.00 - 0.07 K/uL    Comment: Performed at [facility], [address], [place], [place] [address]   CT Angio Chest PE W and/or Wo Contrast  Result Date: 05/09/2022 CLINICAL DATA:  Rule out pulmonary embolism.  History of pneumonia. EXAM: CT ANGIOGRAPHY CHEST WITH CONTRAST TECHNIQUE: Multidetector CT imaging of the chest was performed using the standard protocol during bolus administration of intravenous contrast. Multiplanar CT image reconstructions and MIPs were obtained to evaluate the vascular anatomy. RADIATION DOSE  REDUCTION: This exam was performed according to the departmental dose-optimization program which includes automated exposure control, adjustment of the mA and/or kV according to patient size and/or use of iterative reconstruction technique. CONTRAST:  75mL OMNIPAQUE IOHEXOL 350 MG/ML SOLN COMPARISON:  10/04/16 FINDINGS: Cardiovascular: Satisfactory opacification of the pulmonary arteries to the segmental level. No evidence of pulmonary embolism. Multi chamber cardiac enlargement. Increase caliber of the main pulmonary artery compatible with PA hypertension. Aortic atherosclerosis and coronary artery calcifications. No pericardial effusion. Mediastinum/Nodes: No enlarged mediastinal, hilar, or axillary lymph nodes. Thyroid gland, trachea, and esophagus demonstrate no significant findings. Lungs/Pleura: There is no pneumothorax identified. No pleural effusion. Signs of chronic interstitial lung disease identified with bilateral peripheral predominant and lower lung zone predominant interstitial reticulation, ground-glass attenuation, architectural distortion and bronchiectasis. Multifocal bilateral areas of consolidation change is identified. This is most severe within the posterolateral right upper lobe where there is a large geographic area of airspace consolidation measuring 8.0 x 3.9 cm, image 32/7. Additional, smaller areas of consolidative change identified within the posterior subpleural right lower lobe and subpleural medial right upper lobe the and periphery of the left upper lobe. Upper Abdomen: No acute abnormality. Musculoskeletal: Multilevel spondylosis identified throughout the thoracic spine. Mild curvature of the thoracic spine is convex towards the right. No acute abnormality. Review of the  MIP images confirms the above findings. IMPRESSION: 1. No evidence for acute pulmonary embolus. 2. Signs of chronic interstitial lung disease with bilateral peripheral predominant and lower lung zone predominant  interstitial reticulation, ground-glass attenuation, architectural distortion and bronchiectasis. Consider follow-up imaging with high-resolution CT of the chest when the patient is current, acute clinical situation has resolved. 3. Multifocal bilateral areas of consolidation are identified. This is most severe within the posterolateral right upper lobe where there is a large geographic area of airspace consolidation measuring 8.0 x 3.9 cm. Findings are favored to represent multifocal pneumonia. Follow-up imaging is recommended to ensure resolution and to rule out underlying malignancy. 4. Multi chamber cardiac enlargement with coronary artery calcifications. 5. Increase caliber of the main pulmonary artery compatible with PA hypertension. 6.  Aortic Atherosclerosis (ICD10-I70.0). Electronically Signed   By: Signa Kell M.D.   On: 05/09/2022 14:31   DG Chest 2 View  Result Date: 05/09/2022 CLINICAL DATA:  Shortness of breath. Personal history of breast cancer. EXAM: CHEST - 2 VIEW COMPARISON:  09/11/2013 FINDINGS: Lungs are hyperexpanded. Diffuse interstitial opacity is progressive in the interval with right greater than left patchy relatively diffuse bilateral airspace disease showing nodular configuration in some regions. No pleural effusion. The cardiopericardial silhouette is within normal limits for size. Status post left shoulder arthroplasty. IMPRESSION: Interval progression of diffuse interstitial opacity with right greater than left patchy relatively diffuse bilateral airspace disease showing nodular configuration in some regions. Findings could reflect multifocal pneumonia although given the history of breast cancer, metastatic disease not excluded. CT chest may prove helpful to further evaluate. Electronically Signed   By: Kennith Center M.D.   On: 05/09/2022 12:32    Pending Labs Unresulted Labs (From admission, onward)     Start     Ordered   05/10/22 0500  Basic metabolic panel  Tomorrow  morning,   R        05/09/22 1722   05/10/22 0500  CBC  Tomorrow morning,   R        05/09/22 1722   05/09/22 1740  MRSA Next Gen by PCR, Nasal  Once,   R        05/09/22 1739   05/09/22 1736  Procalcitonin  Add-on,   AD       References:    Procalcitonin Lower Respiratory Tract Infection AND Sepsis Procalcitonin Algorithm   05/09/22 1735   05/09/22 1734  SARS Coronavirus 2 by RT PCR (hospital order, performed in [facility] hospital lab) *cepheid single result test* Anterior Nasal Swab  (Tier 2 - SARS Coronavirus 2 by RT PCR (hospital order, performed in [facility] hospital lab) *cepheid single result test*)  Once,   R        05/09/22 1733   05/09/22 1733  Strep pneumoniae urinary antigen  Once,   R        05/09/22 1732   05/09/22 1732  Expectorated Sputum Assessment w Gram Stain, Rflx to Resp Cult  Once,   R        05/09/22 1732   05/09/22 1732  Legionella Pneumophila Serogp 1 Ur Ag  Once,   R        05/09/22 1732   05/09/22 1504  Blood culture (routine x 2)  BLOOD CULTURE X 2,   R (with STAT occurrences)      05/09/22 1504            Vitals/Pain Today's Vitals   05/09/22 1435 05/09/22 1531 05/09/22  1850 05/09/22 1855  BP: (!) 144/109  130/71   Pulse: 66  70   Resp: 18  16 16   Temp:  98.5 F (36.9 C)    TempSrc:  Oral    SpO2: 100%  100%   Weight:      Height:      PainSc:        Isolation Precautions No active isolations  Medications Medications  escitalopram (LEXAPRO) tablet 10 mg (has no administration in time range)  diphenoxylate-atropine (LOMOTIL) 2.5-0.025 MG per tablet 1 tablet (has no administration in time range)  atorvastatin (LIPITOR) tablet 20 mg (has no administration in time range)  metoprolol succinate (TOPROL-XL) 24 hr tablet 50 mg (has no administration in time range)  pantoprazole (PROTONIX) EC tablet 40 mg (has no administration in time range)  enoxaparin (LOVENOX) injection 30 mg (has no administration in time range)  polyethylene glycol  (MIRALAX / GLYCOLAX) packet 17 g (has no administration in time range)  ipratropium (ATROVENT) nebulizer solution 0.5 mg (has no administration in time range)  lisinopril (ZESTRIL) tablet 5 mg (has no administration in time range)  hydrALAZINE (APRESOLINE) injection 10 mg (has no administration in time range)  guaiFENesin (MUCINEX) 12 hr tablet 600 mg (has no administration in time range)  ceFEPIme (MAXIPIME) 2 g in sodium chloride 0.9 % 100 mL IVPB (has no administration in time range)  vancomycin (VANCOCIN) IVPB 1000 mg/200 mL premix (has no administration in time range)  vancomycin (VANCOREADY) IVPB 750 mg/150 mL (has no administration in time range)  lactated ringers bolus 500 mL (0 mLs Intravenous Stopped 05/09/22 1300)  iohexol (OMNIPAQUE) 350 MG/ML injection 75 mL (75 mLs Intravenous Contrast Given 05/09/22 1358)    Mobility walks with person assist

## 2022-05-09 NOTE — ED Provider Notes (Signed)
89yo female with SHOB x 3 weeks, no improvement with Augmentin and Levaquin. CT PE study shows COPD, multifocal PNA. On  O2 for increased WOB. Weak, not well at home.  Plan to admit pending CBC. Given rocephin, pending CX.  Physical Exam  BP (!) 144/109   Pulse 66   Temp 98.5 F (36.9 C) (Oral)   Resp 18   Ht  (1.626 m)   Wt 61.7 kg   SpO2 100%   BMI 23.35 kg/m   Physical Exam  Procedures  Procedures  ED Course / MDM    Medical Decision Making Amount and/or Complexity of Data Reviewed Labs: ordered. Radiology: ordered.  Risk Prescription drug management. Decision regarding hospitalization.   CBC WNL, hospitalist paged for consult for admission. Case discussed with Dr. Renford Dills with [facility] who will consult for admission.        Jeannie Fend, PA-C 05/09/22 1737    Terald Sleeper, MD 05/09/22 [phone]

## 2022-05-10 ENCOUNTER — Inpatient Hospital Stay (HOSPITAL_COMMUNITY): Payer: Medicare HMO

## 2022-05-10 ENCOUNTER — Encounter (HOSPITAL_COMMUNITY): Payer: Self-pay | Admitting: Internal Medicine

## 2022-05-10 DIAGNOSIS — J189 Pneumonia, unspecified organism: Secondary | ICD-10-CM | POA: Diagnosis not present

## 2022-05-10 DIAGNOSIS — R0603 Acute respiratory distress: Secondary | ICD-10-CM | POA: Diagnosis not present

## 2022-05-10 LAB — CBC
HCT: 32.9 % — ABNORMAL LOW (ref 36.0–46.0)
Hemoglobin: 10.8 g/dL — ABNORMAL LOW (ref 12.0–15.0)
MCH: 32.5 pg (ref 26.0–34.0)
MCHC: 32.8 g/dL (ref 30.0–36.0)
MCV: 99.1 fL (ref 80.0–100.0)
Platelets: 201 10*3/uL (ref 150–400)
RBC: 3.32 MIL/uL — ABNORMAL LOW (ref 3.87–5.11)
RDW: 13.7 % (ref 11.5–15.5)
WBC: 7.9 10*3/uL (ref 4.0–10.5)
nRBC: 0 % (ref 0.0–0.2)

## 2022-05-10 LAB — RESPIRATORY PANEL BY PCR

## 2022-05-10 LAB — BASIC METABOLIC PANEL
Anion gap: 9 (ref 5–15)
BUN: 15 mg/dL (ref 8–23)
CO2: 24 mmol/L (ref 22–32)
Calcium: 8.2 mg/dL — ABNORMAL LOW (ref 8.9–10.3)
Chloride: 99 mmol/L (ref 98–111)
Creatinine, Ser: 0.78 mg/dL (ref 0.44–1.00)
GFR, Estimated: >60 mL/min (ref >60)
Glucose, Bld: 93 mg/dL (ref 70–99)
Potassium: 3.6 mmol/L (ref 3.5–5.1)
Sodium: 132 mmol/L — ABNORMAL LOW (ref 135–145)

## 2022-05-10 LAB — CULTURE, BLOOD (ROUTINE X 2): Culture: NO GROWTH

## 2022-05-10 LAB — MRSA NEXT GEN BY PCR, NASAL: MRSA by PCR Next Gen: NOT DETECTED

## 2022-05-10 LAB — C-REACTIVE PROTEIN: CRP: 9.3 mg/dL — ABNORMAL HIGH (ref <1.0)

## 2022-05-10 LAB — ECHOCARDIOGRAM COMPLETE
AV Vena cont: 0.4 cm
Area-P 1/2: 5.97 cm2
Calc EF: 51.6 %
Height: 64 in
P 1/2 time: 555 msec
S' Lateral: 3.7 cm
Single Plane A2C EF: 50.2 %
Single Plane A4C EF: 50.3 %
Weight: 2176.38 oz

## 2022-05-10 LAB — SARS CORONAVIRUS 2 BY RT PCR: SARS Coronavirus 2 by RT PCR: NEGATIVE

## 2022-05-10 LAB — SEDIMENTATION RATE: Sed Rate: 108 mm/hr — ABNORMAL HIGH (ref 0–22)

## 2022-05-10 LAB — STREP PNEUMONIAE URINARY ANTIGEN: Strep Pneumo Urinary Antigen: NEGATIVE

## 2022-05-10 LAB — PROCALCITONIN: Procalcitonin: <0.1 ng/mL

## 2022-05-10 MED ORDER — GALANTAMINE HYDROBROMIDE ER 8 MG PO CP24
16.0000 mg | ORAL_CAPSULE | Freq: Every day | ORAL | Status: DC
  Administered 2022-05-10 – 2022-05-16 (×7): 16 mg via ORAL
  Filled 2022-05-10 (×7): qty 2

## 2022-05-10 MED ORDER — MEMANTINE HCL ER 28 MG PO CP24
28.0000 mg | ORAL_CAPSULE | Freq: Every day | ORAL | Status: DC
  Administered 2022-05-10 – 2022-05-16 (×7): 28 mg via ORAL
  Filled 2022-05-10 (×7): qty 1

## 2022-05-10 MED ORDER — GUAIFENESIN ER 600 MG PO TB12
1200.0000 mg | ORAL_TABLET | Freq: Two times a day (BID) | ORAL | Status: DC
  Administered 2022-05-10 – 2022-05-16 (×12): 1200 mg via ORAL
  Filled 2022-05-10 (×12): qty 2

## 2022-05-10 MED ORDER — METOPROLOL SUCCINATE ER 25 MG PO TB24: 25.0000 mg | ORAL_TABLET | Freq: Every day | ORAL | Status: DC

## 2022-05-10 MED ORDER — ORAL CARE MOUTH RINSE: 15.0000 mL | OROMUCOSAL | Status: DC | PRN

## 2022-05-10 MED ORDER — ENSURE ENLIVE PO LIQD
237.0000 mL | Freq: Three times a day (TID) | ORAL | Status: DC
  Administered 2022-05-10 – 2022-05-15 (×11): 237 mL via ORAL

## 2022-05-10 MED ORDER — SODIUM CHLORIDE 3 % IN NEBU
4.0000 mL | INHALATION_SOLUTION | Freq: Two times a day (BID) | RESPIRATORY_TRACT | Status: AC
  Administered 2022-05-10 – 2022-05-13 (×5): 4 mL via RESPIRATORY_TRACT
  Filled 2022-05-10 (×6): qty 4

## 2022-05-10 NOTE — TOC CM/SW Note (Signed)
  Transition of Care [facility]) Screening Note   Patient Details  Name: ROZA CREAMER Date of Birth: April 27, 1930   Transition of Care [facility]) CM/SW Contact:    Howell Rucks, RN Phone Number: 05/10/2022, 1:52 PM    Transition of Care Department [facility]) has reviewed patient and no TOC needs have been identified at this time. We will continue to monitor patient advancement through interdisciplinary progression rounds. If new patient transition needs arise, please place a TOC consult.

## 2022-05-10 NOTE — Progress Notes (Signed)
Mobility Specialist - Progress Note   05/10/22 1043  Mobility  Activity Ambulated with assistance in hallway;Transferred to/from [facility]  Level of Assistance Standby assist, set-up cues, supervision of patient - no hands on  Assistive Device Front wheel walker  Distance Ambulated (ft) 120 ft  Activity Response Tolerated well  Mobility Referral Yes  $Mobility charge 1 Mobility   Pt received EOB and agreeable to mobility. Prior to ambulating, pt requested assistance to [facility].  Throughout session pt c/o not being able to burp. Upon returning to room pt was able to burp. No other complaints during session. Pt to bed after session with all needs met.   Post-mobility: 97% SPO2  Chief Technology Officer

## 2022-05-10 NOTE — Progress Notes (Signed)
Echocardiogram 2D Echocardiogram has been performed.  Toni Amend 05/10/2022, 12:03 PM

## 2022-05-10 NOTE — Progress Notes (Signed)
Modified Barium Swallow Study  Patient Details  Name: [name] [name] MRN: [id number] Date of Birth: 02-18-1930  Today's Date: 05/10/2022  Modified Barium Swallow completed.  Full report located under Chart Review in the Imaging Section.  History of Present Illness [name] [name] is a 89 y.o. female with medical history significant of hypertension, hyperlipidemia, breast cancer in remission, anxiety, COPD who was brought by her niece from friends home for follow-up of pneumonia.  Patient was diagnosed with pneumonia by her PCP about 3 to 4 weeks ago and was put on oral antibiotics.  Initially she was started on Augmentin which did not help.  Follow-up chest x-ray showed unimproved pneumonia and she was started on Levaquin.  Currently she is on her fifth day of the course.  Patient has being short of breath, weak mostly when she ambulates .   Clinical Impression Pt demonstrates signs of significant esophageal dysphagia. Oral and oropharyngeal function WNL. After a few sips of liquids and puree pt made and face and reported "its not going down." Observed pt masticate and cracker and exhibit effortful swallow without oral or oropharyngeal impairment, then swept to the esophagus with severe distal residue without observable persitalsis. Liquid wash did not clear stasis. Pill not given.   Pt denies regurgitation but is at risk for postprandial aspiration, particularly if laying in bed.  Possible that this is contributing to recurrent pna. Keep HOB at 30 degrees or higher. Crush medication. Mobilize pt after meals. Use liquid dietary supplements. No benefit of modifying solid texture as pt has severe residue with puree and thin. Continue regular diet and thin liquids. Pt may benefit from f/u with GI depending on pt/family desire to address problem medically. Will f/u for further education with family. Pt is unable to result of test or strategies. Called Niece and left message.  Factors that may increase  risk of adverse event in presence of aspiration [employer] 2021): Reduced cognitive function;Frail or deconditioned;Aspiration of thick, dense, and/or acidic materials  Swallow Evaluation Recommendations Recommendations: PO diet PO Diet Recommendation: Regular;Thin liquids (Level 0) Liquid Administration via: Cup;Straw Medication Administration: Whole meds with liquid Supervision: Patient able to self-feed Swallowing strategies  : Slow rate;Small bites/sips;Follow solids with liquids Postural changes: Out of bed for meals (walk a bit after meals) Oral care recommendations: Oral care BID (2x/day) Recommended consults: Consider GI consultation      Claudine Mouton 05/10/2022,2:40 PM

## 2022-05-10 NOTE — Plan of Care (Signed)
°  Problem: Education: °Goal: Knowledge of General Education information will improve °Description: Including pain rating scale, medication(s)/side effects and non-pharmacologic comfort measures °Outcome: Progressing °  °Problem: Clinical Measurements: °Goal: Ability to maintain clinical measurements within normal limits will improve °Outcome: Progressing °  °Problem: Activity: °Goal: Risk for activity intolerance will decrease °Outcome: Progressing °  °Problem: Nutrition: °Goal: Adequate nutrition will be maintained °Outcome: Progressing °  °Problem: Coping: °Goal: Level of anxiety will decrease °Outcome: Progressing °  °Problem: Pain Managment: °Goal: General experience of comfort will improve °Outcome: Progressing °  °

## 2022-05-10 NOTE — Evaluation (Signed)
Clinical/Bedside Swallow Evaluation Patient Details  Name: [name] [name] MRN: [id number] Date of Birth: 1930-12-15  Today's Date: 05/10/2022 Time: SLP Start Time (ACUTE ONLY): 1030 SLP Stop Time (ACUTE ONLY): 1040 SLP Time Calculation (min) (ACUTE ONLY): 10 min  Past Medical History:  Past Medical History:  Diagnosis Date   Anxiety    Bradycardia    asymptomatic   Breast CA 1993 OR 1994   LEFT, SURGERY AND RADIATION DONE   Breast cancer    COPD (chronic obstructive pulmonary disease)    MILD, NO INHALERS USED   DEGENERATIVE JOINT DISEASE, KNEE    Family history of anesthesia complication    NEPHEW NAUSEA/VOMITING   GENU VALGUM    Hyperlipidemia    Hypertension    Numbness of leg 2011   just left shin   OTHER ACQUIRED DEFORMITY OF ANKLE AND FOOT OTHER    Pneumonia 2013   X 2   PVC's (premature ventricular contractions)    ROTATOR CUFF SYNDROME, LEFT    CANNOT LIFT LEFT ARM ALL THE WAY UP   UNEQUAL LEG LENGTH    UTI (urinary tract infection)    STARTED AUGMENTIN  ON 09-10-13   Past Surgical History:  Past Surgical History:  Procedure Laterality Date   APPENDECTOMY  AGE 10   BREAST LUMPECTOMY Left 1991   radiation   BREAST SURGERY Left 1993 OR 1994   LUMPECTOMY AND RADIATION DONE   JOINT REPLACEMENT     NASAL SINUS SURGERY  2006   REVERSE SHOULDER ARTHROPLASTY Left 07/31/2015   Procedure: LEFT REVERSE SHOULDER ARTHROPLASTY;  Surgeon: Beverely Low, MD;  Location: [facility] OR;  Service: Orthopedics;  Laterality: Left;   TOTAL HIP ARTHROPLASTY Bilateral LEFT 2008 AND RIGHT 2010   TOTAL KNEE ARTHROPLASTY Left 09/17/2013   Procedure: LEFT TOTAL KNEE ARTHROPLASTY;  Surgeon: Shelda Pal, MD;  Location: [facility] ORS;  Service: Orthopedics;  Laterality: Left;   HPI:  [name] [name] is a 89 y.o. female with medical history significant of hypertension, hyperlipidemia, breast cancer in remission, anxiety, COPD who [facility].   Patient was diagnosed with pneumonia by her PCP about 3 to 4 weeks ago and was put on oral antibiotics.  Initially she was started on Augmentin which did not help.  Follow-up chest x-ray showed unimproved pneumonia and she was started on Levaquin.  Currently she is on her fifth day of the course.  Patient has being short of breath, weak mostly when she ambulates .    Assessment / Plan / Recommendation  Clinical Impression  Pt presents with recurrent pna and baseline report of decreased oral intake, chest pressure/globus when eating. On exam today these symptoms are replicated. Pt initially denied any difficulty eating or drinking, but as soon as she took a few bites of puree and sips she started rubbing her sternum complaining "it wont go down" and "I need to burp and I cant." She also had some immediate coughing with sips of water. Likely this is a primary esophageal dyspahgia; though additional or secondary pharyngeal impairments are possible. This could be a contributing factor in pts recurrent pna. Recommend instrumental testing while admitted. Will proceed with MBS when possible.      Aspiration Risk       Diet Recommendation Regular;Thin liquid   Liquid Administration via: Cup;Straw Medication Administration: Whole meds with liquid Supervision: Patient able to self feed Compensations: Slow rate;Small sips/bites Postural Changes: Seated upright at 90  degrees    Other  Recommendations Oral Care Recommendations: Oral care BID    Recommendations for follow up therapy are one component of a multi-disciplinary discharge planning process, led by the attending physician.  Recommendations may be updated based on patient status, additional functional criteria and insurance authorization.  Follow up Recommendations  (F/u [facility] at [facility] ([facility]NF [facility]      Assistance Recommended at Discharge    Functional Status Assessment    Frequency and Duration            Prognosis Prognosis for  improved oropharyngeal function: Good      Swallow Study   General HPI: [name] [name] is a 89 y.o. female with medical history significant of hypertension, hyperlipidemia, breast cancer in remission, anxiety, COPD who [facility].  Patient was diagnosed with pneumonia by her PCP about 3 to 4 weeks ago and was put on oral antibiotics.  Initially she was started on Augmentin which did not help.  Follow-up chest x-ray showed unimproved pneumonia and she was started on Levaquin.  Currently she is on her fifth day of the course.  Patient has being short of breath, weak mostly when she ambulates . Type of Study: Bedside Swallow Evaluation Diet Prior to this Study: Regular;Thin liquids (Level 0) Temperature Spikes Noted: No Respiratory Status: Nasal cannula History of Recent Intubation: No Behavior/Cognition: Alert;Cooperative;Pleasant mood Oral Cavity Assessment: Within Functional Limits Oral Care Completed by SLP: No Oral Cavity - Dentition: Adequate natural dentition Vision: Functional for self-feeding Self-Feeding Abilities: Able to feed self Patient Positioning: Other (comment) (edge of bed) Baseline Vocal Quality: Normal Volitional Cough: Strong Volitional Swallow: Able to elicit    Oral/Motor/Sensory Function Overall Oral Motor/Sensory Function: Within functional limits   Ice Chips     Thin Liquid Thin Liquid: Impaired Presentation: Straw;Self Fed Pharyngeal  Phase Impairments: Cough - Immediate    Nectar Thick Nectar Thick Liquid: Not tested   Honey Thick Honey Thick Liquid: Not tested   Puree Puree: Impaired Presentation: Spoon;Self Fed Other Comments: globus   Solid     Solid: Not tested      Camrin Gearheart, Riley Nearing 05/10/2022,10:46 AM

## 2022-05-10 NOTE — Consult Note (Signed)
NAME:  [name] [name], MRN:  [id number], DOB:  [birth year], LOS: 1 ADMISSION DATE:  05/09/2022, CONSULTATION DATE:  4/23  REFERRING MD: Dr. Renford Dills, CHIEF COMPLAINT:  SOB, weakness    History of Present Illness:  89 y/o F, resident of [facility], who presented to [facility] on 4/22 with reports on weakness, & shortness of breath.   The patient has dementia.  Her Nephew assists in her care. She reportedly has been ill for approximately 3-4 weeks.  She was treated with a course of augmentin without improvement in symptoms.  She has had a dry, non-productive cough for several weeks, weakness, poor appetite and weight loss of approximately 2 lbs. She has only been tolerating ensure and yogurt during the day.  She endorses acid reflux, difficulty with getting pills down at times but no issues with food. She had a follow up CXR as an outpatient and PNA was not improved. She was started on levaquin for the same.  On day of admission, she had completed 5 days of levaquin.  She was admitted on 4/22 for further evaluation of non-resolving PNA. Speech therapy was consulted for evaluation.  She was cleared for regular diet with thin liquids.  There were concerns for primary esophageal dysphagia.  She was recommended for MBS.  CTA chest was completed which was negative for PE, but showed signs of chronic interstitial lung disease with bilateral peripheral predominant and lower lung interstitial reticulation, ground glass attenuation, architectural distortion and bronchiectasis. Multifocal bilateral consolidation, most severe in the posterolateral RUL with a large area of airspace consolidation 8x3.9cm, increased caliber of the main pulmonary artery compatible with PA hypertension.   PCCM consulted for pulmonary evaluation.   Pertinent  Medical History  Anxiety  Bradycardia  Breast Cancer  Dementia HTN  HLD  Unequal Leg Length, Numbness of Left LE DJD  Significant Hospital Events: Including procedures,  antibiotic start and stop dates in addition to other pertinent events   4/22 Admit with weakness, slow to resolve PNA 4/23 PCCM consulted  Interim History / Subjective:  Afebrile  As above   Objective   Blood pressure (!) 101/59, pulse 88, temperature 98 F (36.7 C), resp. rate 19, height  (1.626 m), weight 61.7 kg, SpO2 99 %.        Intake/Output Summary (Last 24 hours) at 05/10/2022 1349 Last data filed at 05/10/2022 1241 Gross per 24 hour  Intake 1071.84 ml  Output 300 ml  Net 771.84 ml   Filed Weights   05/09/22 1129  Weight: 61.7 kg    Examination: General: elderly adult female lying in bed  HENT: MM pink/moist, [place] O2, anicteric, pupils =/reactive  Lungs: non-labored at rest, lungs bilaterally clear anterior, R>L lower posterior basilar crackles Cardiovascular: s1s2 RRR, SR 60's Abdomen: non-distended, BSx4 active  Extremities: warm/dry, no edema  Neuro: Awake, alert, speech clear, oriented to self, appropriate in questions     Resolved Hospital Problem list     Assessment & Plan:   Acute Hypoxic Respiratory Failure  Multifocal Infiltrates / GGO  RUL Dense Consolidation  Rule Out Aspiration  Bronchiectasis  GERD  Discussion: patient with baseline GERD, difficulty with pills subjectively.  Has non-resolving PNA on CXR with CT showing multifocal infiltrates / GGO, bronchiectasis, and large airspace consolidation in RUL.  She has not had significant sputum production or subjective fevers.  Differential includes aspiration PNA in setting of esophageal dysphagia, GERD with aspiration, viral/bacterial PNA (though less likely as has not resolved with two  rounds of abx - levaquin and augmentin), organizing PNA and autoimmune disease. Additionally, she may have had an acute illness that is slow to radiographically resolve.   -assess MBS -continue abx for now, cefepime D2/x  -add hypertonic saline BID x3 days -medication list reviewed, no offending agents for drug  induced lupus pneumonitis -incentive spirometry / flutter valve -increase mucinex  -follow intermittent CXR  -assess limited autoimmune panel  -will need outpatient pulmonary follow up in 4-6 weeks with Dr. Judeth Horn, consideration for repeat imaging -follow up sputum culture  -if not improving or infiltrates migrating, consider steroids    Weight Loss  -add ensure TID    Best Practice (right click and "Reselect all SmartList Selections" daily)  Per [facility]   Labs   CBC: Recent Labs  Lab 05/09/22 1525 05/10/22 0452  WBC 10.1 7.9  NEUTROABS 7.9*  --   HGB 12.8 10.8*  HCT 39.8 32.9*  MCV 99.0 99.1  PLT 268 201    Basic Metabolic Panel: Recent Labs  Lab 05/09/22 1205 05/10/22 0452  NA 131* 132*  K 3.6 3.6  CL 96* 99  CO2 26 24  GLUCOSE 94 93  BUN 15 15  CREATININE 0.80 0.78  CALCIUM 8.4* 8.2*   GFR: Estimated Creatinine Clearance: 40.4 mL/min (by C-G formula based on SCr of 0.78 mg/dL). Recent Labs  Lab 05/09/22 1303 05/09/22 1525 05/10/22 0452  PROCALCITON  --   --  <0.10  WBC  --  10.1 7.9  LATICACIDVEN 1.7  --   --     Liver Function Tests: Recent Labs  Lab 05/09/22 1205  AST 26  ALT 18  ALKPHOS 84  BILITOT 0.9  PROT 7.3  ALBUMIN 2.7*   No results for input(s): "LIPASE", "AMYLASE" in the last 168 hours. No results for input(s): "AMMONIA" in the last 168 hours.  ABG    Component Value Date/Time   HCO3 32.0 (H) 05/09/2022 1205   O2SAT 21.6 05/09/2022 1205     Coagulation Profile: No results for input(s): "INR", "PROTIME" in the last 168 hours.  Cardiac Enzymes: No results for input(s): "CKTOTAL", "CKMB", "CKMBINDEX", "TROPONINI" in the last 168 hours.  HbA1C: No results found for: "HGBA1C"  CBG: No results for input(s): "GLUCAP" in the last 168 hours.  Review of Systems: Positives in [place]   Gen: Denies fever, chills, weight change, fatigue, night sweats, generalized weakness HEENT: Denies blurred vision, double vision, hearing loss,  tinnitus, sinus congestion, rhinorrhea, sore throat, neck stiffness, dysphagia PULM: Denies shortness of breath, cough, sputum production, hemoptysis, wheezing CV: Denies chest pain, edema, orthopnea, paroxysmal nocturnal dyspnea, palpitations GI: Denies abdominal pain, nausea, vomiting, diarrhea, hematochezia, melena, constipation, change in bowel habits GU: Denies dysuria, hematuria, polyuria, oliguria, urethral discharge Endocrine: Denies hot or cold intolerance, polyuria, polyphagia or appetite change Derm: Denies rash, dry skin, scaling or peeling skin change Heme: Denies easy bruising, bleeding, bleeding gums Neuro: Denies headache, numbness, weakness, slurred speech, loss of memory or consciousness  Past Medical History:  She,  has a past medical history of Anxiety, Bradycardia, Breast CA (1993 OR 1994), Breast cancer, COPD (chronic obstructive pulmonary disease), DEGENERATIVE JOINT DISEASE, KNEE, Family history of anesthesia complication, GENU VALGUM, Hyperlipidemia, Hypertension, Numbness of leg (2011), OTHER ACQUIRED DEFORMITY OF ANKLE AND FOOT OTHER, Pneumonia (2013), PVC's (premature ventricular contractions), ROTATOR CUFF SYNDROME, LEFT, UNEQUAL LEG LENGTH, and UTI (urinary tract infection).   Surgical History:   Past Surgical History:  Procedure Laterality Date   APPENDECTOMY  AGE 27  BREAST LUMPECTOMY Left 1991   radiation   BREAST SURGERY Left 1993 OR 1994   LUMPECTOMY AND RADIATION DONE   JOINT REPLACEMENT     NASAL SINUS SURGERY  2006   REVERSE SHOULDER ARTHROPLASTY Left 07/31/2015   Procedure: LEFT REVERSE SHOULDER ARTHROPLASTY;  Surgeon: Beverely Low, MD;  Location: [facility] OR;  Service: Orthopedics;  Laterality: Left;   TOTAL HIP ARTHROPLASTY Bilateral LEFT 2008 AND RIGHT 2010   TOTAL KNEE ARTHROPLASTY Left 09/17/2013   Procedure: LEFT TOTAL KNEE ARTHROPLASTY;  Surgeon: Shelda Pal, MD;  Location: [facility] ORS;  Service: Orthopedics;  Laterality: Left;     Social History:    reports that she quit smoking about 30 years ago. Her smoking use included cigarettes. She has a 3.75 pack-year smoking history. She has never been exposed to tobacco smoke. She has never used smokeless tobacco. She reports current alcohol use. She reports that she does not use drugs.   Family History:  Her family history includes Diabetes in her mother; Heart Problems in her father; Hypertension in her brother.   Allergies Allergies  Allergen Reactions   Albuterol Other (See Comments)    "Had some rapid heartbeat in 2013 when she took it for some bronchitis"   Diltiazem Other (See Comments)    "Pre-syncope, bp went a bit low for bp on 180 mg dose"     Naproxen Swelling   Other Nausea Only and Other (See Comments)    AN UNNAMED PAIN MEDICATION CAUSED NAUSEA   Cardizem [Diltiazem Hcl] Other (See Comments)    Hypotension   Nitrofurantoin Rash and Other (See Comments)    Sept., 2018       Home Medications  Prior to Admission medications   Medication Sig Start Date End Date Taking? Authorizing Provider  atorvastatin (LIPITOR) 20 MG tablet Take 20 mg by mouth daily.   Yes [provider]  Cyanocobalamin (VITAMIN B12) 1000 MCG TBCR Take 1,000 mg by mouth daily.   Yes [provider]  Cyanocobalamin 1000 MCG/ML KIT Inject 1,000 mcg into the skin See admin instructions. Inject 1,000 mcg into the skin every 8 weeks   Yes [provider]  escitalopram (LEXAPRO) 20 MG tablet Take 20 mg by mouth daily.   Yes [provider]  galantamine (RAZADYNE ER) 16 MG 24 hr capsule Take 16 mg by mouth daily with breakfast. 04/08/22  Yes [provider]  levofloxacin (LEVAQUIN) 25 MG/ML solution Take 500 mg by mouth daily. 05/04/22 05/11/22 Yes [provider]  metoprolol succinate (TOPROL-XL) 50 MG 24 hr tablet Take 1 tablet (50 mg total) by mouth daily. Take with or immediately following a meal. Can take an additional tab as needed for heartrate over 100  or for palpitations 01/19/22  Yes Turner, Cornelious Bryant, MD  Probiotic Product (ALIGN) CHEW Chew 2 tablets by mouth daily.   Yes [provider]  diphenoxylate-atropine (LOMOTIL) 2.5-0.025 MG per tablet Take 1 tablet by mouth 4 (four) times daily as needed for diarrhea or loose stools. Patient not taking: Reported on 05/09/2022 09/20/13   Renato Gails, Tiffany L, DO  lisinopril (ZESTRIL) 5 MG tablet TAKE 1 TABLET BY MOUTH DAILY, YOU MAY TAKE 1 EXTRA TABLET DAILY AS NEEDED FOR HIGH BLOOD PRESSURE Patient not taking: Reported on 05/09/2022 03/17/21   Dyann Kief, PA-C     Critical care time: n/a     Canary Brim, MSN, APRN, NP-C, AGACNP-BC [facility] 05/10/2022, 1:49 PM   Please see [email or web address]  for pager details.   From 7A-7P if no response, please call [phone] After hours, please call ELink [phone]

## 2022-05-10 NOTE — Progress Notes (Signed)
PROGRESS NOTE  [name] [name]  ZOX:[id number] DOB: Oct 03, 1930 DOA: 05/09/2022 PCP: Rodrigo Ran, MD   Brief Narrative: Patient  is a 89 y.o. female with medical history significant of hypertension, hyperlipidemia, breast cancer in remission, anxiety, COPD who was brought by her niece from friends home for follow-up of pneumonia.  Patient was diagnosed with pneumonia by her PCP about 3 to 4 weeks ago and was put on oral antibiotics.  Initially she was started on Augmentin which did not help.  Follow-up chest x-ray showed unimproved pneumonia and she was started on Levaquin.   Patient remained short of breath, weak mostly when she ambulates . Chest imaging showed multifocal pneumonia/ILD like picture.  Pulmonology consulted today  Assessment & Plan:  Principal Problem:   PNA (pneumonia) Active Problems:   Hypertension   Depression   COPD (chronic obstructive pulmonary disease)   Breast cancer  Multifocal pneumonia: Patient was recently diagnosed with community-acquired pneumonia by her PCP.  Treated with azithromycin without benefit so antibiotics changed to Levaquin.  No fever or chills.  No leukocytosis. Lactate level normal.  Negative procalcitonin. Negative COVID screening.  Respiratory viral panel pending Patient has been started on broad-spectrum antibiotics vancomycin and cefepime.  Will follow-up cultures.  Will get a sputum culture, Legionella/streptococcal urine antigen pending. Currently she is saturating fine on room air.  She was on Supplemental oxygen just for comfort Chest imaging showed features of ILD, multifocal bilateral consolidations.  No pulmonary embolism. Continue incentive spirometer, bronchodilators as needed, mucolytics. Continue antibiotics for now.  We requested pulmonary consultation    Hypertension: Continue metoprolol.  Monitor blood pressures  History of COPD: Continue bronchodilators as needed.  Does not use supplemental oxygen at home.  Past smoker    Dysphagia: As per the niece, she is having problem with swallowing recently.  Speech therapy consulted, cleared.   Hyperlipidemia: Takes Lipitor   History of depression: On Lexapro.  History of dementia: Mostly remains alert and oriented.  On galantamine, memantine   History of left breast cancer: Currently in remission   Elevated BNP: Pending echocardiogram.  Features of ILD in the lung imagings   History of GERD: Continue Protonix   Goals of care: Elderly female with multifocal pneumonia.  CODE STATUS is DNR.  Confirmed with the niece            DVT prophylaxis:enoxaparin (LOVENOX) injection 40 mg Start: 05/09/22 2200     Code Status: DNR  Family Communication: Niece at bedside  Patient status:Inpatient  Patient is from :Home  Anticipated discharge WJ:XBJY  Estimated DC date:after pulmonology clearance   Consultants: PCCM  Procedures: None  Antimicrobials:  Anti-infectives (From admission, onward)    Start     Dose/Rate Route Frequency Ordered Stop   05/10/22 2200  vancomycin (VANCOREADY) IVPB 750 mg/150 mL  Status:  Discontinued        750 mg 150 mL/hr over 60 Minutes Intravenous Every 24 hours 05/09/22 1736 05/10/22 0948   05/09/22 2200  ceFEPIme (MAXIPIME) 2 g in sodium chloride 0.9 % 100 mL IVPB        2 g 200 mL/hr over 30 Minutes Intravenous Every 12 hours 05/09/22 1736     05/09/22 1745  vancomycin (VANCOCIN) IVPB 1000 mg/200 mL premix        1,000 mg 200 mL/hr over 60 Minutes Intravenous  Once 05/09/22 1736 05/09/22 2306   05/09/22 1515  cefTRIAXone (ROCEPHIN) 1 g in sodium chloride 0.9 % 100 mL IVPB  Status:  Discontinued        1 g 200 mL/hr over 30 Minutes Intravenous Every 24 hours 05/09/22 1506 05/09/22 1728       Subjective: Patient seen and examined the bedside today.  Hemodynamically stable.  Niece at bedside.  She was in a potty  She looks comfortable.  But she remains dyspneic on minimal exertion like ambulation.  No worsening  cough.  Febrile currently on 1 to 2 L of oxygen for comfort.  Lungs are almost clear on auscultation  Objective: Vitals:   05/09/22 2048 05/10/22 0043 05/10/22 0456 05/10/22 1100  BP: 138/77 100/64 116/68 (!) 109/56  Pulse: 64 63 63 62  Resp: 17 16 19 14   Temp: 97.9 F (36.6 C) 97.8 F (36.6 C) 97.8 F (36.6 C) 98.2 F (36.8 C)  TempSrc: Oral Oral Oral   SpO2: 100% 100% 100% 97%  Weight:      Height:        Intake/Output Summary (Last 24 hours) at 05/10/2022 1140 Last data filed at 05/10/2022 0900 Gross per 24 hour  Intake 831.84 ml  Output 300 ml  Net 531.84 ml   Filed Weights   05/09/22 1129  Weight: 61.7 kg    Examination:  General exam: Overall comfortable, not in distress, pleasant elderly female HEENT: PERRL Respiratory system:  no wheezes or crackles , coarse breath sounds Cardiovascular system: S1 & S2 heard, RRR.  Gastrointestinal system: Abdomen is nondistended, soft and nontender. Central nervous system: Alert and oriented Extremities: No edema, no clubbing ,no cyanosis Skin: No rashes, no ulcers,no icterus     Data Reviewed: I have personally reviewed following labs and imaging studies  CBC: Recent Labs  Lab 05/09/22 1525 05/10/22 0452  WBC 10.1 7.9  NEUTROABS 7.9*  --   HGB 12.8 10.8*  HCT 39.8 32.9*  MCV 99.0 99.1  PLT 268 201   Basic Metabolic Panel: Recent Labs  Lab 05/09/22 1205 05/10/22 0452  NA 131* 132*  K 3.6 3.6  CL 96* 99  CO2 26 24  GLUCOSE 94 93  BUN 15 15  CREATININE 0.80 0.78  CALCIUM 8.4* 8.2*     Recent Results (from the past 240 hour(s))  Blood culture (routine x 2)     Status: None (Preliminary result)   Collection Time: 05/09/22  3:25 PM   Specimen: Left Antecubital; Blood  Result Value Ref Range Status   Specimen Description   Final    LEFT ANTECUBITAL BLOOD Performed at [facility], [address], [place], [place] [address]    Special Requests   Final    BOTTLES DRAWN AEROBIC AND ANAEROBIC Blood  Culture results may not be optimal due to an excessive volume of blood received in culture bottles Performed at [facility], [address], [place], [place] [address]    Culture   Final    NO GROWTH < 12 HOURS Performed at [facility], [address] [place], [place] [address]    Report Status PENDING  Incomplete  Blood culture (routine x 2)     Status: None (Preliminary result)   Collection Time: 05/09/22  3:25 PM   Specimen: BLOOD  Result Value Ref Range Status   Specimen Description   Final    BLOOD SPECIMEN SOURCE NOT MARKED ON REQUISITION Performed at [facility], [address] [place], [place] [address]    Special Requests   Final    BOTTLES DRAWN AEROBIC AND ANAEROBIC Blood  Culture results may not be optimal due to an excessive volume of blood received in culture bottles Performed at [facility], [address] [place], [place] [address]    Culture   Final    NO GROWTH < 12 HOURS Performed at [facility], [address], [place], [place] [address]    Report Status PENDING  Incomplete  SARS Coronavirus 2 by RT PCR (hospital order, performed in [facility] hospital lab) *cepheid single result test* Anterior Nasal Swab     Status: None   Collection Time: 05/10/22  2:01 AM   Specimen: Anterior Nasal Swab  Result Value Ref Range Status   SARS Coronavirus 2 by RT PCR NEGATIVE NEGATIVE Final    Comment: (NOTE) SARS-CoV-2 target nucleic acids are NOT DETECTED.  The SARS-CoV-2 RNA is generally detectable in upper and lower respiratory specimens during the acute phase of infection. The lowest concentration of SARS-CoV-2 viral copies this assay can detect is 250 copies / mL. A negative result does not preclude SARS-CoV-2 infection and should not be used as the sole basis for treatment or other patient management decisions.  A negative result may occur with improper specimen collection / handling,  submission of specimen other than nasopharyngeal swab, presence of viral mutation(s) within the areas targeted by this assay, and inadequate number of viral copies (<250 copies / mL). A negative result must be combined with clinical observations, patient history, and epidemiological information.  Fact Sheet for Patients:   RoadLapTop.co.za  Fact Sheet for Healthcare Providers: http://kim-miller.com/  This test is not yet approved or  cleared by the [place] FDA and has been authorized for detection and/or diagnosis of SARS-CoV-2 by FDA under an Emergency Use Authorization (EUA).  This EUA will remain in effect (meaning this test can be used) for the duration of the COVID-19 declaration under Section 564(b)(1) of the Act, 21 U.S.C. section 360bbb-3(b)(1), unless the authorization is terminated or revoked sooner.  Performed at [facility], [address] [place], [place] [address]   MRSA Next Gen by PCR, Nasal     Status: None   Collection Time: 05/10/22  2:01 AM   Specimen: Nasal Mucosa; Nasal Swab  Result Value Ref Range Status   MRSA by PCR Next Gen NOT DETECTED NOT DETECTED Final    Comment: (NOTE) The GeneXpert MRSA Assay (FDA approved for NASAL specimens only), is one component of a comprehensive MRSA colonization surveillance program. It is not intended to diagnose MRSA infection nor to guide or monitor treatment for MRSA infections. Test performance is not FDA approved in patients less than 69 years old. Performed at [facility], [address], [place], [place] [address]      Radiology Studies: CT Angio Chest PE W and/or Wo Contrast  Result Date: 05/09/2022 CLINICAL DATA:  Rule out pulmonary embolism.  History of pneumonia. EXAM: CT ANGIOGRAPHY CHEST WITH CONTRAST TECHNIQUE: Multidetector CT imaging of the chest was performed using the standard protocol during bolus  administration of intravenous contrast. Multiplanar CT image reconstructions and MIPs were obtained to evaluate the vascular anatomy. RADIATION DOSE REDUCTION: This exam was performed according to the departmental dose-optimization program which includes automated exposure control, adjustment of the mA and/or kV according to patient size and/or use of iterative reconstruction technique. CONTRAST:  75mL OMNIPAQUE IOHEXOL 350 MG/ML SOLN COMPARISON:  10/04/16 FINDINGS: Cardiovascular: Satisfactory opacification of the pulmonary arteries to the segmental level. No evidence of pulmonary embolism. Multi chamber cardiac  enlargement. Increase caliber of the main pulmonary artery compatible with PA hypertension. Aortic atherosclerosis and coronary artery calcifications. No pericardial effusion. Mediastinum/Nodes: No enlarged mediastinal, hilar, or axillary lymph nodes. Thyroid gland, trachea, and esophagus demonstrate no significant findings. Lungs/Pleura: There is no pneumothorax identified. No pleural effusion. Signs of chronic interstitial lung disease identified with bilateral peripheral predominant and lower lung zone predominant interstitial reticulation, ground-glass attenuation, architectural distortion and bronchiectasis. Multifocal bilateral areas of consolidation change is identified. This is most severe within the posterolateral right upper lobe where there is a large geographic area of airspace consolidation measuring 8.0 x 3.9 cm, image 32/7. Additional, smaller areas of consolidative change identified within the posterior subpleural right lower lobe and subpleural medial right upper lobe the and periphery of the left upper lobe. Upper Abdomen: No acute abnormality. Musculoskeletal: Multilevel spondylosis identified throughout the thoracic spine. Mild curvature of the thoracic spine is convex towards the right. No acute abnormality. Review of the MIP images confirms the above findings. IMPRESSION: 1. No evidence  for acute pulmonary embolus. 2. Signs of chronic interstitial lung disease with bilateral peripheral predominant and lower lung zone predominant interstitial reticulation, ground-glass attenuation, architectural distortion and bronchiectasis. Consider follow-up imaging with high-resolution CT of the chest when the patient is current, acute clinical situation has resolved. 3. Multifocal bilateral areas of consolidation are identified. This is most severe within the posterolateral right upper lobe where there is a large geographic area of airspace consolidation measuring 8.0 x 3.9 cm. Findings are favored to represent multifocal pneumonia. Follow-up imaging is recommended to ensure resolution and to rule out underlying malignancy. 4. Multi chamber cardiac enlargement with coronary artery calcifications. 5. Increase caliber of the main pulmonary artery compatible with PA hypertension. 6.  Aortic Atherosclerosis (ICD10-I70.0). Electronically Signed   By: Signa Kell M.D.   On: 05/09/2022 14:31   DG Chest 2 View  Result Date: 05/09/2022 CLINICAL DATA:  Shortness of breath. Personal history of breast cancer. EXAM: CHEST - 2 VIEW COMPARISON:  09/11/2013 FINDINGS: Lungs are hyperexpanded. Diffuse interstitial opacity is progressive in the interval with right greater than left patchy relatively diffuse bilateral airspace disease showing nodular configuration in some regions. No pleural effusion. The cardiopericardial silhouette is within normal limits for size. Status post left shoulder arthroplasty. IMPRESSION: Interval progression of diffuse interstitial opacity with right greater than left patchy relatively diffuse bilateral airspace disease showing nodular configuration in some regions. Findings could reflect multifocal pneumonia although given the history of breast cancer, metastatic disease not excluded. CT chest may prove helpful to further evaluate. Electronically Signed   By: Kennith Center M.D.   On:  05/09/2022 12:32    Scheduled Meds:  atorvastatin  20 mg Oral QHS   enoxaparin (LOVENOX) injection  40 mg Subcutaneous Q24H   escitalopram  20 mg Oral q morning   galantamine  16 mg Oral Q breakfast   guaiFENesin  600 mg Oral BID   memantine  28 mg Oral Daily   metoprolol succinate  50 mg Oral Daily   pantoprazole  40 mg Oral Daily   Continuous Infusions:  ceFEPime (MAXIPIME) IV 2 g (05/10/22 1118)     LOS: 1 day   Burnadette Pop, MD [facility] P4/23/2024, 11:40 AM

## 2022-05-11 ENCOUNTER — Inpatient Hospital Stay (HOSPITAL_COMMUNITY): Payer: Medicare HMO

## 2022-05-11 DIAGNOSIS — J189 Pneumonia, unspecified organism: Secondary | ICD-10-CM | POA: Diagnosis not present

## 2022-05-11 LAB — C4 COMPLEMENT: Complement C4, Body Fluid: 25 mg/dL (ref 12–38)

## 2022-05-11 LAB — CULTURE, BLOOD (ROUTINE X 2): Culture: NO GROWTH

## 2022-05-11 LAB — C3 COMPLEMENT: C3 Complement: 118 mg/dL (ref 82–167)

## 2022-05-11 LAB — CYCLIC CITRUL PEPTIDE ANTIBODY, IGG/IGA: CCP Antibodies IgG/IgA: 19 units (ref 0–19)

## 2022-05-11 LAB — RHEUMATOID FACTOR: Rheumatoid fact SerPl-aCnc: 15.9 IU/mL — ABNORMAL HIGH (ref <14.0)

## 2022-05-11 NOTE — Evaluation (Signed)
Physical Therapy Evaluation Patient Details Name: [name] [name] MRN: [id number] DOB: Apr 11, 1930 Today's Date: 05/11/2022  History of Present Illness  89 y.o. female with medical history significant of hypertension, dementia, hyperlipidemia, breast cancer in remission, anxiety, COPD who was brought by her niece from friends home for follow-up of non resolving pneumonia. MBS showed significant esophageal dysphagia, so likely this is all from chronic aspiration  Clinical Impression  Pt admitted with above diagnosis. Pt ambulated 120' with RW, distance limited by 2/4 dyspnea, SpO2 83% on room air walking, 98% room air at rest. Will titrate O2 with ambulation next session. Pt currently with functional limitations due to the deficits listed below (see PT Problem List). Pt will benefit from acute skilled PT to increase their independence and safety with mobility to allow discharge.          Recommendations for follow up therapy are one component of a multi-disciplinary discharge planning process, led by the attending physician.  Recommendations may be updated based on patient status, additional functional criteria and insurance authorization.  Follow Up Recommendations       Assistance Recommended at Discharge Intermittent Supervision/Assistance  Patient can return home with the following  A little help with walking and/or transfers;A little help with bathing/dressing/bathroom;Assistance with cooking/housework;Assist for transportation;Help with stairs or ramp for entrance;Direct supervision/assist for medications management;Direct supervision/assist for financial management    Equipment Recommendations Rolling walker (2 wheels)  Recommendations for Other Services       Functional Status Assessment Patient has had a recent decline in their functional status and demonstrates the ability to make significant improvements in function in a reasonable and predictable amount of time.     Precautions  / Restrictions Precautions Precautions: Fall;Other (comment) Precaution Comments: monitor O2 sats Restrictions Weight Bearing Restrictions: No      Mobility  Bed Mobility Overal bed mobility: Modified Independent             General bed mobility comments: used bedrails    Transfers Overall transfer level: Needs assistance Equipment used: Rolling walker (2 wheels) Transfers: Sit to/from Stand Sit to Stand: Supervision           General transfer comment: VCs for safe hand placement    Ambulation/Gait Ambulation/Gait assistance: Supervision Gait Distance (Feet): 120 Feet Assistive device: Rolling walker (2 wheels) Gait Pattern/deviations: Step-through pattern, Decreased stride length Gait velocity: WFL     General Gait Details: SpO2 83% on room air walking with 2/4 dyspnea and chest tightness, 98% on room air at rest,  Stairs            Wheelchair Mobility    Modified Rankin (Stroke Patients Only)       Balance Overall balance assessment: Modified Independent                                           Pertinent Vitals/Pain Pain Assessment Pain Assessment: Faces Faces Pain Scale: Hurts little more Pain Location: chest with walking Pain Descriptors / Indicators: Grimacing Pain Intervention(s): Limited activity within patient's tolerance, Monitored during session    Home Living Family/patient expects to be discharged to:: Assisted living Living Arrangements: Alone                      Prior Function Prior Level of Function : Patient poor historian/Family not available  Mobility Comments: pt reports she doesn't use an assistive device, no family present to confirm       Hand Dominance        Extremity/Trunk Assessment   Upper Extremity Assessment Upper Extremity Assessment: Overall WFL for tasks assessed    Lower Extremity Assessment Lower Extremity Assessment: Overall WFL for tasks  assessed    Cervical / Trunk Assessment Cervical / Trunk Assessment: Normal  Communication   Communication: No difficulties  Cognition Arousal/Alertness: Awake/alert Behavior During Therapy: WFL for tasks assessed/performed Overall Cognitive Status: No family/caregiver present to determine baseline cognitive functioning                                 General Comments: pt pleasant, able to follow commands        General Comments      Exercises     Assessment/Plan    PT Assessment Patient needs continued PT services  PT Problem List Decreased mobility;Decreased activity tolerance;Decreased cognition       PT Treatment Interventions Gait training;Therapeutic activities;Patient/family education;Therapeutic exercise    PT Goals (Current goals can be found in the Care Plan section)  Acute Rehab PT Goals PT Goal Formulation: Patient unable to participate in goal setting Time For Goal Achievement: 05/25/22 Potential to Achieve Goals: Good    Frequency Min 1X/week     Co-evaluation               AM-PAC PT "6 Clicks" Mobility  Outcome Measure Help needed turning from your back to your side while in a flat bed without using bedrails?: A Little Help needed moving from lying on your back to sitting on the side of a flat bed without using bedrails?: A Little Help needed moving to and from a bed to a chair (including a wheelchair)?: A Little Help needed standing up from a chair using your arms (e.g., wheelchair or bedside chair)?: A Little Help needed to walk in hospital room?: A Little Help needed climbing 3-5 steps with a railing? : A Little 6 Click Score: 18    End of Session Equipment Utilized During Treatment: Gait belt;Oxygen Activity Tolerance: Patient limited by fatigue Patient left: in chair;with chair alarm set;with call bell/phone within reach Nurse Communication: Mobility status PT Visit Diagnosis: Difficulty in walking, not elsewhere  classified (R26.2)    Time: 2956-2130 PT Time Calculation (min) (ACUTE ONLY): 18 min   Charges:   PT Evaluation $PT Eval Moderate Complexity: 1 Mod          Tamala Ser PT 05/11/2022  Acute Rehabilitation Services  Office [phone]

## 2022-05-11 NOTE — TOC Initial Note (Signed)
Transition of Care [facility]) - Initial/Assessment Note    Patient Details  Name: [name] [name] MRN: [id number] Date of Birth: 11/20/1930  [facility]) CM/SW Contact:    Howell Rucks, RN Phone Number: 05/11/2022, 4:20 PM  Clinical Narrative: NCM call to pt's Niece Jeanice Lim) to introduce TOC/NCM role and review dc recommendations for [facility] PT, 2 wheel RW. Holly states not agreeable with dc plan for [facility] PT, feels pt will need more care than a few days a week, states pt has Dementia. Per [place], pt was independent with mobility but required assistance with all ADL's, medication administration, confirmed pt has no DME at home. Per Jeanice Lim she will request to speak with attending at pt hospital visit tomorrow.  NCM will assist with resources for memory care and/or PDN as needed. Will continue to follow          Patient Goals and CMS Choice            Expected Discharge Plan and Services                                              Prior Living Arrangements/Services                       Activities of Daily Living Home Assistive Devices/Equipment: None ADL Screening (condition at time of admission) Patient's cognitive ability adequate to safely complete daily activities?: Yes Is the patient deaf or have difficulty hearing?: Yes Does the patient have difficulty seeing, even when wearing glasses/contacts?: No Does the patient have difficulty concentrating, remembering, or making decisions?: Yes Patient able to express need for assistance with ADLs?: Yes Does the patient have difficulty dressing or bathing?: No Independently performs ADLs?: Yes (appropriate for developmental age) Does the patient have difficulty walking or climbing stairs?: Yes Weakness of Legs: Both Weakness of Arms/Hands: None  Permission Sought/Granted                  Emotional Assessment              Admission diagnosis:  PNA (pneumonia) [J18.9] Multifocal pneumonia  [J18.9] Patient Active Problem List   Diagnosis Date Noted   PNA (pneumonia) 05/09/2022   Breast cancer 05/09/2022   S/P shoulder replacement 07/31/2015   COPD (chronic obstructive pulmonary disease) 09/26/2013   Depression 09/20/2013   Unspecified constipation 09/20/2013   Expected blood loss anemia 09/18/2013   S/P left TKA 09/17/2013   Bradycardia    PVC's (premature ventricular contractions)    Hypertension    DEGENERATIVE JOINT DISEASE, KNEE 09/01/2009   ROTATOR CUFF SYNDROME, LEFT 09/01/2009   GENU VALGUM 09/01/2009   OTHER ACQUIRED DEFORMITY OF ANKLE AND FOOT OTHER 09/01/2009   UNEQUAL LEG LENGTH 09/01/2009   PCP:  Rodrigo Ran, MD Pharmacy:   [facility] [address] - [place], [place] - [address] [facility] [address] [place] [address] Phone: [phone] Fax: [phone]  [facility] - [place], [place] - [address] [place] [address] Phone: [phone] Fax: [phone]  [id number] [place], [place] - [place] [address] [place] [address] Phone: [phone] Fax: [phone]     Social Determinants of Health (SDOH) Social History: SDOH Screenings   Food Insecurity: No Food Insecurity (05/10/2022)  Housing: Low Risk  (05/10/2022)  Transportation Needs: No Transportation Needs (05/10/2022)  Utilities: Not At Risk (05/10/2022)  Tobacco Use: Medium Risk (05/10/2022)   SDOH Interventions:     Readmission Risk Interventions    05/11/2022    4:19 PM  Readmission Risk Prevention Plan  Post Dischage Appt Complete  Transportation Screening Complete

## 2022-05-11 NOTE — Progress Notes (Signed)
SATURATION QUALIFICATIONS: (This note is used to comply with regulatory documentation for home oxygen)  Patient Saturations on Room Air at Rest = 98%  Patient Saturations on Room Air while Ambulating = 83%  Will titrate O2 when ambulating next session.   Ralene Bathe Kistler PT 05/11/2022  Acute Rehabilitation Services  Office [phone]

## 2022-05-11 NOTE — Progress Notes (Signed)
PROGRESS NOTE  [name] [name]  FAO:[id number] DOB: 1930/03/24 DOA: 05/09/2022 PCP: Rodrigo Ran, MD   Brief Narrative: Patient  is a 89 y.o. female with medical history significant of hypertension, hyperlipidemia, breast cancer in remission, anxiety, COPD who was brought by her niece from friends home for follow-up of pneumonia.  Patient was diagnosed with pneumonia by her PCP about 3 to 4 weeks ago and was put on oral antibiotics.  Initially she was started on Augmentin which did not help.  Follow-up chest x-ray showed unimproved pneumonia and she was started on Levaquin.   Patient remained short of breath, weak mostly when she ambulates . Chest imaging showed multifocal pneumonia/ILD like picture.  Pulmonology consulted .  MBS showed significant esophageal dysphagia, so likely this is all from chronic aspiration.  Plan for barium esophagram.  Assessment & Plan:  Principal Problem:   PNA (pneumonia) Active Problems:   Hypertension   Depression   COPD (chronic obstructive pulmonary disease)   Breast cancer  Multifocal pneumonia: Patient was recently diagnosed with community-acquired pneumonia by her PCP.  Treated with azithromycin without benefit so antibiotics changed to Levaquin.  No fever or chills.  No leukocytosis. Lactate level normal.  Negative procalcitonin. Negative COVID screening.  Respiratory viral panel pending Patient has been started on broad-spectrum antibiotics vancomycin and cefepime. Cultures now growth till date.  Will get a sputum culture, negative streptococcal urine antigen . Chest imaging showed features of ILD, multifocal bilateral consolidations.  No pulmonary embolism. Continue incentive spirometer, bronchodilators as needed, mucolytics.  Pulmonary team added hypertonic saline Continue antibiotics for now.   MBS showed significant esophageal dysphagia, DG esophagram ordered.  If it shows significant problem, will consult GI. Needs to have small volume meals at a  time, sitting upright position while eating and sitting for at least 1 hour after meals.  Hypertension: Bp soft.  Antihypertensives on hold  History of COPD: Continue bronchodilators as needed.  Does not use supplemental oxygen at home.  Past smoker   Dysphagia: Plan as above.  Speech therapy recommending solid diet, thin liquid   Hyperlipidemia: Takes Lipitor   History of depression: On Lexapro.  History of dementia: Mostly remains alert and oriented.  On galantamine, memantine   History of left breast cancer: Currently in remission   Elevated BNP: Echo showed EF of 50 to 55%, grade 1 diastolic dysfunction.  Currently appears euvolemic.   History of GERD: Continue Protonix   Goals of care: Elderly female with multifocal pneumonia.  CODE STATUS is DNR.  Confirmed with the niece.  PT consulted         Pressure Injury 05/10/22 Sacrum Stage 1 -  Intact skin with non-blanchable redness of a localized area usually over a bony prominence. (Active)  05/10/22 1500  Location: Sacrum  Location Orientation:   Staging: Stage 1 -  Intact skin with non-blanchable redness of a localized area usually over a bony prominence.  Wound Description (Comments):   Present on Admission:   Dressing Type Foam - Lift dressing to assess site every shift 05/11/22 0831    DVT prophylaxis:enoxaparin (LOVENOX) injection 40 mg Start: 05/09/22 2200     Code Status: DNR  Family Communication: Niece at bedside  Patient status:Inpatient  Patient is from :Home  Anticipated discharge ON:GEXB  Estimated DC date:after pulmonology clearance/pending DG esophragm.Needs to wean the oxygen   Consultants: PCCM  Procedures: MBS  Antimicrobials:  Anti-infectives (From admission, onward)    Start     Dose/Rate Route Frequency  Ordered Stop   05/10/22 2200  vancomycin (VANCOREADY) IVPB 750 mg/150 mL  Status:  Discontinued        750 mg 150 mL/hr over 60 Minutes Intravenous Every 24 hours 05/09/22 1736  05/10/22 0948   05/09/22 2200  ceFEPIme (MAXIPIME) 2 g in sodium chloride 0.9 % 100 mL IVPB        2 g 200 mL/hr over 30 Minutes Intravenous Every 12 hours 05/09/22 1736     05/09/22 1745  vancomycin (VANCOCIN) IVPB 1000 mg/200 mL premix        1,000 mg 200 mL/hr over 60 Minutes Intravenous  Once 05/09/22 1736 05/09/22 2306   05/09/22 1515  cefTRIAXone (ROCEPHIN) 1 g in sodium chloride 0.9 % 100 mL IVPB  Status:  Discontinued        1 g 200 mL/hr over 30 Minutes Intravenous Every 24 hours 05/09/22 1506 05/09/22 1728       Subjective: Patient seen and examined at bedside today.  Appears overall comfortable.  Lying in bed.  Still on 2 to 3 L of oxygen per minute.  Not dyspneic on rest.  She says she feels fine.  Daughter thinks she is dyspneic while on ambulation even when  to the bathroom.   Objective: Vitals:   05/10/22 2056 05/11/22 0213 05/11/22 0754 05/11/22 1206  BP: (!) 101/58 99/65  118/73  Pulse: (!) 59 66 68 (!) 59  Resp: Temp: 98 F (36.7 C) 98.3 F (36.8 C)  98.3 F (36.8 C)  TempSrc: Oral Oral  Oral  SpO2: 99% 91% 99% 100%  Weight:      Height:        Intake/Output Summary (Last 24 hours) at 05/11/2022 1228 Last data filed at 05/11/2022 0900 Gross per 24 hour  Intake 480 ml  Output 200 ml  Net 280 ml   Filed Weights   05/09/22 1129  Weight: 61.7 kg    Examination:  General exam: Overall comfortable, not in distress, pleasant elderly female HEENT: PERRL Respiratory system:  no wheezes or crackles  Cardiovascular system: S1 & S2 heard, RRR.  Gastrointestinal system: Abdomen is nondistended, soft and nontender. Central nervous system: Alert and oriented Extremities: No edema, no clubbing ,no cyanosis Skin: No rashes, no ulcers,no icterus     Data Reviewed: I have personally reviewed following labs and imaging studies  CBC: Recent Labs  Lab 05/09/22 1525 05/10/22 0452  WBC 10.1 7.9  NEUTROABS 7.9*  --   HGB 12.8 10.8*  HCT 39.8  32.9*  MCV 99.0 99.1  PLT 268 201   Basic Metabolic Panel: Recent Labs  Lab 05/09/22 1205 05/10/22 0452  NA 131* 132*  K 3.6 3.6  CL 96* 99  CO2 26 24  GLUCOSE 94 93  BUN 15 15  CREATININE 0.80 0.78  CALCIUM 8.4* 8.2*     Recent Results (from the past 240 hour(s))  Blood culture (routine x 2)     Status: None (Preliminary result)   Collection Time: 05/09/22  3:25 PM   Specimen: Left Antecubital; Blood  Result Value Ref Range Status   Specimen Description   Final    LEFT ANTECUBITAL BLOOD Performed at [facility], [address] [place], [place] [address]    Special Requests   Final    BOTTLES DRAWN AEROBIC AND ANAEROBIC Blood Culture results may not be optimal due to an excessive volume of blood received in culture bottles Performed at [facility], [address], [place], [place] [address]    Culture   Final    NO GROWTH 2 DAYS Performed at [facility], [address], [place], [place] [address]    Report Status PENDING  Incomplete  Blood culture (routine x 2)     Status: None (Preliminary result)   Collection Time: 05/09/22  3:25 PM   Specimen: BLOOD  Result Value Ref Range Status   Specimen Description   Final    BLOOD SPECIMEN SOURCE NOT MARKED ON REQUISITION Performed at [facility], [address] [place], [place] [address]    Special Requests   Final    BOTTLES DRAWN AEROBIC AND ANAEROBIC Blood Culture results may not be optimal due to an excessive volume of blood received in culture bottles Performed at [facility], [address] [place], [place] [address]    Culture   Final    NO GROWTH 2 DAYS Performed at [facility], [address] [place], [place] [address]    Report Status PENDING  Incomplete  SARS Coronavirus 2 by RT PCR (hospital order, performed in [facility] hospital lab) *cepheid single result test* Anterior Nasal Swab     Status: None   Collection Time:  05/10/22  2:01 AM   Specimen: Anterior Nasal Swab  Result Value Ref Range Status   SARS Coronavirus 2 by RT PCR NEGATIVE NEGATIVE Final    Comment: (NOTE) SARS-CoV-2 target nucleic acids are NOT DETECTED.  The SARS-CoV-2 RNA is generally detectable in upper and lower respiratory specimens during the acute phase of infection. The lowest concentration of SARS-CoV-2 viral copies this assay can detect is 250 copies / mL. A negative result does not preclude SARS-CoV-2 infection and should not be used as the sole basis for treatment or other patient management decisions.  A negative result may occur with improper specimen collection / handling, submission of specimen other than nasopharyngeal swab, presence of viral mutation(s) within the areas targeted by this assay, and inadequate number of viral copies (<250 copies / mL). A negative result must be combined with clinical observations, patient history, and epidemiological information.  Fact Sheet for Patients:   RoadLapTop.co.za  Fact Sheet for Healthcare Providers: http://kim-miller.com/  This test is not yet approved or  cleared by the [place] FDA and has been authorized for detection and/or diagnosis of SARS-CoV-2 by FDA under an Emergency Use Authorization (EUA).  This EUA will remain in effect (meaning this test can be used) for the duration of the COVID-19 declaration under Section 564(b)(1) of the Act, 21 U.S.C. section 360bbb-3(b)(1), unless the authorization is terminated or revoked sooner.  Performed at [facility], [address] [place], [place] [address]   MRSA Next Gen by PCR, Nasal     Status: None   Collection Time: 05/10/22  2:01 AM   Specimen: Nasal Mucosa; Nasal Swab  Result Value Ref Range Status   MRSA by PCR Next Gen NOT DETECTED NOT DETECTED Final    Comment: (NOTE) The GeneXpert MRSA Assay (FDA approved for NASAL specimens only), is  one component of a comprehensive MRSA colonization surveillance program. It is not intended to diagnose MRSA infection nor to guide or monitor treatment for MRSA infections. Test performance is not FDA approved in patients less than 24 years old. Performed at [facility], [address], [place], [place] [address]   Respiratory (~20 pathogens) panel by PCR  Status: None   Collection Time: 05/10/22 10:26 AM   Specimen: Nasopharyngeal Swab; Respiratory  Result Value Ref Range Status   Adenovirus NOT DETECTED NOT DETECTED Final   Coronavirus 229E NOT DETECTED NOT DETECTED Final    Comment: (NOTE) The Coronavirus on the Respiratory Panel, DOES NOT test for the novel  Coronavirus (2019 nCoV)    Coronavirus HKU1 NOT DETECTED NOT DETECTED Final   Coronavirus NL63 NOT DETECTED NOT DETECTED Final   Coronavirus OC43 NOT DETECTED NOT DETECTED Final   Metapneumovirus NOT DETECTED NOT DETECTED Final   Rhinovirus / Enterovirus NOT DETECTED NOT DETECTED Final   Influenza A NOT DETECTED NOT DETECTED Final   Influenza B NOT DETECTED NOT DETECTED Final   Parainfluenza Virus 1 NOT DETECTED NOT DETECTED Final   Parainfluenza Virus 2 NOT DETECTED NOT DETECTED Final   Parainfluenza Virus 3 NOT DETECTED NOT DETECTED Final   Parainfluenza Virus 4 NOT DETECTED NOT DETECTED Final   Respiratory Syncytial Virus NOT DETECTED NOT DETECTED Final   Bordetella pertussis NOT DETECTED NOT DETECTED Final   Bordetella Parapertussis NOT DETECTED NOT DETECTED Final   Chlamydophila pneumoniae NOT DETECTED NOT DETECTED Final   Mycoplasma pneumoniae NOT DETECTED NOT DETECTED Final    Comment: Performed at [facility], [address] [place], [place] [address]     Radiology Studies: DG Swallowing Func-Speech Pathology  Result Date: 05/10/2022 Table formatting from the original result was not included. Modified Barium Swallow Study Patient Details Name: [name] [name] MRN: [id number] Date  of Birth: 10/23/1930 Today's Date: 05/10/2022 HPI/PMH: HPI: SARYN CHERRY is a 89 y.o. female with medical history significant of hypertension, hyperlipidemia, breast cancer in remission, anxiety, COPD who was brought by her niece from friends home for follow-up of pneumonia.  Patient was diagnosed with pneumonia by her PCP about 3 to 4 weeks ago and was put on oral antibiotics.  Initially she was started on Augmentin which did not help.  Follow-up chest x-ray showed unimproved pneumonia and she was started on Levaquin.  Currently she is on her fifth day of the course.  Patient has being short of breath, weak mostly when she ambulates . Clinical Impression: Pt demonstrates signs of significant esophageal dysphagia. Oral and oropharyngeal function WNL. After a few sips of liquids and puree pt made and face and reported "its not going down." Observed pt masticate and cracker and exhibit effortful swallow without oral or oropharyngeal impairment, then swept to the esophagus with severe distal residue without observable persitalsis. Liquid wash did not clear stasis. Pill not given. Pt denies regurgitation but is at risk for postprandial aspiration, particularly if laying in bed.  Possible that this is contributing to recurrent pna. Keep HOB at 30 degrees or higher. Crush medication. Mobilize pt after meals. Use liquid dietary supplements. No benefit of modifying solid texture as pt has severe residue with puree and thin. Continue regular diet and thin liquids. Pt may benefit from f/u with GI depending on pt/family desire to address problem medically. Will f/u for further education with family. Pt is unable to result of test or strategies. Called Niece and left message. Factors that may increase risk of adverse event in presence of aspiration Rubye Oaks [employer] Clearance Coots 2021): Factors that may increase risk of adverse event in presence of aspiration Rubye Oaks & Clearance Coots 2021): Reduced cognitive function; Frail or deconditioned;  Aspiration of thick, dense, and/or acidic materials Recommendations/Plan: Swallowing Evaluation Recommendations Swallowing Evaluation Recommendations Recommendations: PO diet PO Diet Recommendation: Regular; Thin  liquids (Level 0) Liquid Administration via: Cup; Straw Medication Administration: Whole meds with liquid Supervision: Patient able to self-feed Swallowing strategies  : Slow rate; Small bites/sips; Follow solids with liquids Postural changes: Out of bed for meals (walk a bit after meals) Oral care recommendations: Oral care BID (2x/day) Recommended consults: Consider GI consultation Treatment Plan Treatment Plan Treatment recommendations: Therapy as outlined in treatment plan below Follow-up recommendations: Home health SLP Functional status assessment: Patient has had a recent decline in their functional status and demonstrates the ability to make significant improvements in function in a reasonable and predictable amount of time. Treatment frequency: Min 2x/week Treatment duration: 1 week Interventions: Aspiration precaution training; Patient/family education; Compensatory techniques Recommendations Recommendations for follow up therapy are one component of a multi-disciplinary discharge planning process, led by the attending physician.  Recommendations may be updated based on patient status, additional functional criteria and insurance authorization. Assessment: Orofacial Exam: Orofacial Exam Oral Cavity - Dentition: Adequate natural dentition Anatomy: No data recorded Boluses Administered: Boluses Administered Boluses Administered: Thin liquids (Level 0); Mildly thick liquids (Level 2, nectar thick); Moderately thick liquids (Level 3, honey thick); Puree; Solid  Oral Impairment Domain: Oral Impairment Domain Lip Closure: No labial escape Tongue control during bolus hold: Cohesive bolus between tongue to palatal seal Bolus preparation/mastication: Slow prolonged chewing/mashing with complete  recollection Bolus transport/lingual motion: Delayed initiation of tongue motion (oral holding) Oral residue: Complete oral clearance Location of oral residue : N/A  Pharyngeal Impairment Domain: Pharyngeal Impairment Domain Soft palate elevation: No bolus between soft palate (SP)/pharyngeal wall (PW) Laryngeal elevation: Complete superior movement of thyroid cartilage with complete approximation of arytenoids to epiglottic petiole Anterior hyoid excursion: Complete anterior movement Epiglottic movement: Complete inversion Laryngeal vestibule closure: Complete, no air/contrast in laryngeal vestibule Pharyngeal stripping wave : Present - complete Pharyngeal contraction (A/P view only): N/A Pharyngoesophageal segment opening: Partial distention/partial duration, partial obstruction of flow Pharyngeal residue: Trace residue within or on pharyngeal structures Location of pharyngeal residue: Pharyngeal wall; Pyriform sinuses  Esophageal Impairment Domain: Esophageal Impairment Domain Esophageal clearance upright position: Esophageal retention Pill: Esophageal Impairment Domain Esophageal clearance upright position: Esophageal retention Penetration/Aspiration Scale Score: Penetration/Aspiration Scale Score 1.  Material does not enter airway: Thin liquids (Level 0); Mildly thick liquids (Level 2, nectar thick); Moderately thick liquids (Level 3, honey thick); Solid; Puree Compensatory Strategies: Compensatory Strategies Compensatory strategies: Yes Liquid wash: Ineffective   General Information: Caregiver present: No  Diet Prior to this Study: Regular; Thin liquids (Level 0)   Temperature : Normal   Respiratory Status: WFL   Supplemental O2: Nasal cannula   No data recorded Behavior/Cognition: Alert; Cooperative; Pleasant mood Self-Feeding Abilities: Able to self-feed Baseline vocal quality/speech: Normal Volitional Cough: Able to elicit Volitional Swallow: Able to elicit No data recorded Goal Planning: Prognosis for  improved oropharyngeal function: Guarded Barriers to Reach Goals: Severity of deficits No data recorded No data recorded No data recorded Pain: No data recorded End of Session: Start Time:SLP Start Time (ACUTE ONLY): 1400 Stop Time: SLP Stop Time (ACUTE ONLY): 1415 Time Calculation:SLP Time Calculation (min) (ACUTE ONLY): 15 min Charges: SLP Evaluations $ SLP Speech Visit: 1 Visit SLP Evaluations $BSS Swallow: 1 Procedure $MBS Swallow: 1 Procedure SLP visit diagnosis: SLP Visit Diagnosis: Dysphagia, unspecified (R13.10) Past Medical History: Past Medical History: Diagnosis Date  Anxiety   Bradycardia   asymptomatic  Breast CA 1993 OR 1994  LEFT, SURGERY AND RADIATION DONE  COPD (chronic obstructive pulmonary disease)   MILD, NO INHALERS USED  DEGENERATIVE JOINT DISEASE, KNEE   Family history of anesthesia complication   NEPHEW NAUSEA/VOMITING  GENU VALGUM   Hyperlipidemia   Hypertension   Numbness of leg 2011  just left shin  OTHER ACQUIRED DEFORMITY OF ANKLE AND FOOT OTHER   Pneumonia 2013  X 2  PVC's (premature ventricular contractions)   ROTATOR CUFF SYNDROME, LEFT   CANNOT LIFT LEFT ARM ALL THE WAY UP  UNEQUAL LEG LENGTH   UTI (urinary tract infection)   STARTED AUGMENTIN  ON 09-10-13 Past Surgical History: Past Surgical History: Procedure Laterality Date  APPENDECTOMY  AGE 17  BREAST LUMPECTOMY Left 1991  radiation  BREAST SURGERY Left 1993 OR 1994  LUMPECTOMY AND RADIATION DONE  JOINT REPLACEMENT    NASAL SINUS SURGERY  2006  REVERSE SHOULDER ARTHROPLASTY Left 07/31/2015  Procedure: LEFT REVERSE SHOULDER ARTHROPLASTY;  Surgeon: Beverely Low, MD;  Location: [facility] OR;  Service: Orthopedics;  Laterality: Left;  TOTAL HIP ARTHROPLASTY Bilateral LEFT 2008 AND RIGHT 2010  TOTAL KNEE ARTHROPLASTY Left 09/17/2013  Procedure: LEFT TOTAL KNEE ARTHROPLASTY;  Surgeon: Shelda Pal, MD;  Location: [facility] ORS;  Service: Orthopedics;  Laterality: Left; [place], Riley Nearing 05/10/2022, 2:41 PM  ECHOCARDIOGRAM COMPLETE  Result  Date: 05/10/2022    ECHOCARDIOGRAM REPORT   Patient Name:   [name] [name] Date of Exam: 05/10/2022 Medical Rec #:  [id number]        Height:       64.0 in Accession #:    [id number]       Weight:       136.0 lb Date of Birth:  [birth year]         BSA:          1.661 m Patient Age:    89 years         BP:           116/68 mmHg Patient Gender: F                HR:           58 bpm. Exam Location:  Inpatient Procedure: 2D Echo, Cardiac Doppler and Color Doppler Indications:    Acute respiratory distress  History:        Patient has no prior history of Echocardiogram examinations.                 COPD; Risk Factors:Hypertension and Dyslipidemia.  Sonographer:    Mike Gip Referring Phys: [id number] Noelene Gang  Sonographer Comments: Technically difficult study due to poor echo windows. Image acquisition challenging due to breast implants. IMPRESSIONS  1. Left ventricular ejection fraction, by estimation, is 50 to 55%. The left ventricle has low normal function. The left ventricle has no regional wall motion abnormalities. Left ventricular diastolic parameters are consistent with Grade I diastolic dysfunction (impaired relaxation).  2. Right ventricular systolic function is normal. The right ventricular size is mildly enlarged. There is mildly elevated pulmonary artery systolic pressure. The estimated right ventricular systolic pressure is 44.0 mmHg.  3. Right atrial size was mildly dilated.  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 Trivial mitral valve regurgitation. No evidence of mitral stenosis. There is mild late systolic prolapse of both leaflets of the mitral valve.  5. The tricuspid valve is myxomatous. Tricuspid valve regurgitation is mild to moderate.  6. The aortic valve is tricuspid. There is mild calcification of the aortic valve. Aortic valve regurgitation is mild to moderate. Aortic valve sclerosis is present, with no evidence of aortic valve  stenosis.  7. The inferior vena cava is dilated in size with >50%  respiratory variability, suggesting right atrial pressure of 8 mmHg. FINDINGS  Left Ventricle: Left ventricular ejection fraction, by estimation, is 50 to 55%. The left ventricle has low normal function. The left ventricle has no regional wall motion abnormalities. The left ventricular internal cavity size was normal in size. There is no left ventricular hypertrophy. Left ventricular diastolic parameters are consistent with Grade I diastolic dysfunction (impaired relaxation). Normal left ventricular filling pressure. Right Ventricle: The right ventricular size is mildly enlarged. No increase in right ventricular wall thickness. Right ventricular systolic function is normal. There is mildly elevated pulmonary artery systolic pressure. The tricuspid regurgitant velocity is 3.00 m/s, and with an assumed right atrial pressure of 8 mmHg, the estimated right ventricular systolic pressure is 44.0 mmHg. Left Atrium: Left atrial size was normal in size. Right Atrium: Right atrial size was mildly dilated. Pericardium: There is no evidence of pericardial effusion. Mitral Valve: The mitral valve is myxomatous. There is mild late systolic prolapse of both leaflets of the mitral valve. Trivial mitral valve regurgitation. No evidence of mitral valve stenosis. Tricuspid Valve: The tricuspid valve is myxomatous. Tricuspid valve regurgitation is mild to moderate. No evidence of tricuspid stenosis. There is mild prolapse of the tricuspid. Aortic Valve: The aortic valve is tricuspid. There is mild calcification of the aortic valve. Aortic valve regurgitation is mild to moderate. Aortic regurgitation PHT measures 555 msec. Aortic valve sclerosis is present, with no evidence of aortic valve stenosis. Pulmonic Valve: The pulmonic valve was normal in structure. Pulmonic valve regurgitation is mild. No evidence of pulmonic stenosis. Aorta: The aortic root is normal in size and structure. Venous: The inferior vena cava is dilated in size  with greater than 50% respiratory variability, suggesting right atrial pressure of 8 mmHg. IAS/Shunts: No atrial level shunt detected by color flow Doppler.  LEFT VENTRICLE PLAX 2D LVIDd:         4.90 cm     Diastology LVIDs:         3.70 cm     LV e' medial:    4.57 cm/s LV PW:         0.90 cm     LV E/e' medial:  10.4 LV IVS:        1.00 cm     LV e' lateral:   9.14 cm/s LVOT diam:     2.00 cm     LV E/e' lateral: 5.2 LV SV:         47 LV SV Index:   28 LVOT Area:     3.14 cm  LV Volumes (MOD) LV vol d, MOD A2C: 92.6 ml LV vol d, MOD A4C: 89.6 ml LV vol s, MOD A2C: 46.1 ml LV vol s, MOD A4C: 44.5 ml LV SV MOD A2C:     46.5 ml LV SV MOD A4C:     89.6 ml LV SV MOD BP:      48.1 ml RIGHT VENTRICLE             IVC RV Basal diam:  5.00 cm     IVC diam: 2.30 cm RV S prime:     12.60 cm/s TAPSE (M-mode): 1.8 cm LEFT ATRIUM             Index        RIGHT ATRIUM           Index LA diam:  2.00 cm 1.20 cm/m   RA Area:     21.80 cm LA Vol (A2C):   62.2 ml 37.45 ml/m  RA Volume:   80.70 ml  48.59 ml/m LA Vol (A4C):   41.1 ml 24.75 ml/m LA Biplane Vol: 50.5 ml 30.41 ml/m  AORTIC VALVE                   PULMONIC VALVE LVOT Vmax:         72.50 cm/s  PR End Diast Vel: 2.46 msec LVOT Vmean:        44.800 cm/s LVOT VTI:          0.149 m AI PHT:            555 msec AR Vena Contracta: 0.40 cm  AORTA Ao Root diam: 3.10 cm Ao Asc diam:  3.40 cm MITRAL VALVE               TRICUSPID VALVE MV Area (PHT): 5.97 cm    TR Peak grad:   36.0 mmHg MV Decel Time: 127 msec    TR Vmax:        300.00 cm/s MV E velocity: 47.60 cm/s MV A velocity: 53.60 cm/s  SHUNTS MV E/A ratio:  0.89        Systemic VTI:  0.15 m                            Systemic Diam: 2.00 cm Rachelle Hora Croitoru MD Electronically signed by Thurmon Fair MD Signature Date/Time: 05/10/2022/12:28:25 PM    Final    CT Angio Chest PE W and/or Wo Contrast  Result Date: 05/09/2022 CLINICAL DATA:  Rule out pulmonary embolism.  History of pneumonia. EXAM: CT ANGIOGRAPHY CHEST  WITH CONTRAST TECHNIQUE: Multidetector CT imaging of the chest was performed using the standard protocol during bolus administration of intravenous contrast. Multiplanar CT image reconstructions and MIPs were obtained to evaluate the vascular anatomy. RADIATION DOSE REDUCTION: This exam was performed according to the departmental dose-optimization program which includes automated exposure control, adjustment of the mA and/or kV according to patient size and/or use of iterative reconstruction technique. CONTRAST:  75mL OMNIPAQUE IOHEXOL 350 MG/ML SOLN COMPARISON:  10/04/16 FINDINGS: Cardiovascular: Satisfactory opacification of the pulmonary arteries to the segmental level. No evidence of pulmonary embolism. Multi chamber cardiac enlargement. Increase caliber of the main pulmonary artery compatible with PA hypertension. Aortic atherosclerosis and coronary artery calcifications. No pericardial effusion. Mediastinum/Nodes: No enlarged mediastinal, hilar, or axillary lymph nodes. Thyroid gland, trachea, and esophagus demonstrate no significant findings. Lungs/Pleura: There is no pneumothorax identified. No pleural effusion. Signs of chronic interstitial lung disease identified with bilateral peripheral predominant and lower lung zone predominant interstitial reticulation, ground-glass attenuation, architectural distortion and bronchiectasis. Multifocal bilateral areas of consolidation change is identified. This is most severe within the posterolateral right upper lobe where there is a large geographic area of airspace consolidation measuring 8.0 x 3.9 cm, image 32/7. Additional, smaller areas of consolidative change identified within the posterior subpleural right lower lobe and subpleural medial right upper lobe the and periphery of the left upper lobe. Upper Abdomen: No acute abnormality. Musculoskeletal: Multilevel spondylosis identified throughout the thoracic spine. Mild curvature of the thoracic spine is convex  towards the right. No acute abnormality. Review of the MIP images confirms the above findings. IMPRESSION: 1. No evidence for acute pulmonary embolus. 2. Signs of chronic interstitial lung disease with bilateral peripheral predominant and lower  lung zone predominant interstitial reticulation, ground-glass attenuation, architectural distortion and bronchiectasis. Consider follow-up imaging with high-resolution CT of the chest when the patient is current, acute clinical situation has resolved. 3. Multifocal bilateral areas of consolidation are identified. This is most severe within the posterolateral right upper lobe where there is a large geographic area of airspace consolidation measuring 8.0 x 3.9 cm. Findings are favored to represent multifocal pneumonia. Follow-up imaging is recommended to ensure resolution and to rule out underlying malignancy. 4. Multi chamber cardiac enlargement with coronary artery calcifications. 5. Increase caliber of the main pulmonary artery compatible with PA hypertension. 6.  Aortic Atherosclerosis (ICD10-I70.0). Electronically Signed   By: Signa Kell M.D.   On: 05/09/2022 14:31    Scheduled Meds:  atorvastatin  20 mg Oral QHS   enoxaparin (LOVENOX) injection  40 mg Subcutaneous Q24H   escitalopram  20 mg Oral q morning   feeding supplement  237 mL Oral TID BM   galantamine  16 mg Oral Q breakfast   guaiFENesin  1,200 mg Oral BID   memantine  28 mg Oral Daily   pantoprazole  40 mg Oral Daily   sodium chloride HYPERTONIC  4 mL Nebulization BID   Continuous Infusions:  ceFEPime (MAXIPIME) IV 2 g (05/11/22 1016)     LOS: 2 days   Burnadette Pop, MD [facility] P4/24/2024, 12:28 PM

## 2022-05-11 NOTE — Progress Notes (Signed)
SLP Cancellation Note  Patient Details Name: ELAYAH KLOOSTER MRN: [id number] DOB: 11/26/1930   Cancelled treatment:       Reason Eval/Treat Not Completed: Other (comment). No family at bedside, left a voicemail for niece yesterday. Will f/u for education with family. Pt unable to retain information.    Yuya Vanwingerden, Riley Nearing 05/11/2022, 1:57 PM

## 2022-05-11 NOTE — Progress Notes (Signed)
NAME:  [name] [name], MRN:  [id number], DOB:  [birth year], LOS: 2 ADMISSION DATE:  05/09/2022, CONSULTATION DATE:  4/23  REFERRING MD: Dr. Renford Dills, CHIEF COMPLAINT:  SOB, weakness    History of Present Illness:  89 y/o F, resident of [facility], who presented to [facility] on 4/22 with reports on weakness, & shortness of breath.   The patient has dementia.  Her Nephew assists in her care. She reportedly has been ill for approximately 3-4 weeks.  She was treated with a course of augmentin without improvement in symptoms.  She has had a dry, non-productive cough for several weeks, weakness, poor appetite and weight loss of approximately 2 lbs. She has only been tolerating ensure and yogurt during the day.  She endorses acid reflux, difficulty with getting pills down at times but no issues with food. She had a follow up CXR as an outpatient and PNA was not improved. She was started on levaquin for the same.  On day of admission, she had completed 5 days of levaquin.  She was admitted on 4/22 for further evaluation of non-resolving PNA. Speech therapy was consulted for evaluation.  She was cleared for regular diet with thin liquids.  There were concerns for primary esophageal dysphagia.  She was recommended for MBS.  CTA chest was completed which was negative for PE, but showed signs of chronic interstitial lung disease with bilateral peripheral predominant and lower lung interstitial reticulation, ground glass attenuation, architectural distortion and bronchiectasis. Multifocal bilateral consolidation, most severe in the posterolateral RUL with a large area of airspace consolidation 8x3.9cm, increased caliber of the main pulmonary artery compatible with PA hypertension.   PCCM consulted for pulmonary evaluation.   Pertinent  Medical History  Anxiety  Bradycardia  Breast Cancer  Dementia HTN  HLD  Unequal Leg Length, Numbness of Left LE DJD  Significant Hospital Events: Including procedures,  antibiotic start and stop dates in addition to other pertinent events   4/22 Admit with weakness, slow to resolve PNA 4/23 PCCM consulted  Interim History / Subjective:  No distress   Objective   Blood pressure 99/65, pulse 68, temperature 98.3 F (36.8 C), temperature source Oral, resp. rate 16, height  (1.626 m), weight 61.7 kg, SpO2 99 %.        Intake/Output Summary (Last 24 hours) at 05/11/2022 1000 Last data filed at 05/11/2022 0227 Gross per 24 hour  Intake 360 ml  Output 200 ml  Net 160 ml   Filed Weights   05/09/22 1129  Weight: 61.7 kg    Examination: General 89 year old female sitting up in bed no distress HENT NCAT no JVD Pulm posterior crackles   Card rrr Abd soft Ext warm  Neuro awake, pleasant. Oriented X 3 but has difficulty w/ memory     Resolved Hospital Problem list     Assessment & Plan:   Acute Hypoxic Respiratory Failure  Multifocal Infiltrates / GGO  w/ RUL Dense Consolidation and chronic Bronchiectasis secondary to recurrent asporation GERD Wt loss Dementia  Discussion: MBS findings reviewed. Does have significant esophageal dysphagia. We have sent serologies but think more likely we are dealing w/ recurrent and chronic aspiration. The difficulty swallowing from time to time raise additional concern about esophageal motility and even esophageal stricture.   Plan Complete 5 d abx Complete 3d HT nebs Cont mucinex F/u limited auto immune panel Will get esophagram Will need to educate on reflux and aspiration precautions.  I have discussed w/ daughter  that this may become a QOL issue depending on how she tolerates precautions (smaller more frequent meals, sitting up for at least a hour after meals etc..)  F/u [place] as out pt    Best Practice (right click and "Reselect all SmartList Selections" daily)  Per [facility]

## 2022-05-12 ENCOUNTER — Telehealth: Payer: Self-pay | Admitting: Pulmonary Disease

## 2022-05-12 DIAGNOSIS — J181 Lobar pneumonia, unspecified organism: Secondary | ICD-10-CM

## 2022-05-12 DIAGNOSIS — J189 Pneumonia, unspecified organism: Secondary | ICD-10-CM | POA: Diagnosis not present

## 2022-05-12 LAB — ANA: Anti Nuclear Antibody (ANA): POSITIVE — AB

## 2022-05-12 LAB — LEGIONELLA PNEUMOPHILA SEROGP 1 UR AG: L. pneumophila Serogp 1 Ur Ag: NEGATIVE

## 2022-05-12 LAB — ANTI-DNA ANTIBODY, DOUBLE-STRANDED: ds DNA Ab: 1 IU/mL (ref 0–9)

## 2022-05-12 LAB — SJOGRENS SYNDROME-A EXTRACTABLE NUCLEAR ANTIBODY: SSA (Ro) (ENA) Antibody, IgG: <0.2 AI (ref 0.0–0.9)

## 2022-05-12 LAB — ANCA TITERS
Atypical P-ANCA titer: <1:20 {titer}
C-ANCA: <1:20 {titer}
P-ANCA: <1:20 {titer}

## 2022-05-12 LAB — CULTURE, BLOOD (ROUTINE X 2)

## 2022-05-12 LAB — SJOGRENS SYNDROME-B EXTRACTABLE NUCLEAR ANTIBODY: SSB (La) (ENA) Antibody, IgG: <0.2 AI (ref 0.0–0.9)

## 2022-05-12 LAB — ANTI-SCLERODERMA ANTIBODY: Scleroderma (Scl-70) (ENA) Antibody, IgG: <0.2 AI (ref 0.0–0.9)

## 2022-05-12 MED ORDER — PNEUMOCOCCAL 20-VAL CONJ VACC 0.5 ML IM SUSY
0.5000 mL | PREFILLED_SYRINGE | INTRAMUSCULAR | Status: DC
  Filled 2022-05-12: qty 0.5

## 2022-05-12 MED ORDER — AMOXICILLIN-POT CLAVULANATE 875-125 MG PO TABS
1.0000 | ORAL_TABLET | Freq: Two times a day (BID) | ORAL | Status: DC
  Administered 2022-05-12 – 2022-05-16 (×8): 1 via ORAL
  Filled 2022-05-12 (×8): qty 1

## 2022-05-12 NOTE — Telephone Encounter (Signed)
Please arrange hospital follow-up with Dr. Judeth Horn in 4 to 6 weeks from today's date, appointment should be on the date after CT scan of the chest that is ordered.

## 2022-05-12 NOTE — Telephone Encounter (Signed)
Ct is scheduled for 06/14/2022 at [facility] @ 11:15a

## 2022-05-12 NOTE — Progress Notes (Signed)
Speech Language Pathology Treatment:    Patient Details Name: [name] [name] MRN: [id number] DOB: 03/09/1930 Today's Date: 05/12/2022 Time: [id number] SLP Time Calculation (min) (ACUTE ONLY): 10 min  Assessment / Plan / Recommendation Clinical Impression  Pt reports tolerating diet. Does not complain about globus currently. Observed to take sips without difficulty, but meal not present at this time. SLP reviewed basic precautions with pt, but family not present. Pts short term memory does not support carry over. Recommend f/u with HH/SNF SLP at next level of care for further implementation of interventions. Discussed with Pts Niece [name] [name]. Will sign off acutely.   HPI        SLP Plan         Recommendations for follow up therapy are one component of a multi-disciplinary discharge planning process, led by the attending physician.  Recommendations may be updated based on patient status, additional functional criteria and insurance authorization.    Recommendations  Diet recommendations: Regular;Thin liquid Liquids provided via: Cup;Straw Medication Administration: Whole meds with liquid Supervision: Patient able to self feed                                    [name] [name], [name] [name]  05/12/2022, 1:43 PM

## 2022-05-12 NOTE — Progress Notes (Signed)
NAME:  [name] [name], MRN:  [id number], DOB:  [birth year], LOS: 3 ADMISSION DATE:  05/09/2022, CONSULTATION DATE:  4/23  REFERRING MD: Dr. Renford Dills, CHIEF COMPLAINT:  SOB, weakness    History of Present Illness:  89 y/o F, resident of [facility], who presented to [facility] on 4/22 with reports on weakness, & shortness of breath.   The patient has dementia.  Her Nephew assists in her care. She reportedly has been ill for approximately 3-4 weeks.  She was treated with a course of augmentin without improvement in symptoms.  She has had a dry, non-productive cough for several weeks, weakness, poor appetite and weight loss of approximately 2 lbs. She has only been tolerating ensure and yogurt during the day.  She endorses acid reflux, difficulty with getting pills down at times but no issues with food. She had a follow up CXR as an outpatient and PNA was not improved. She was started on levaquin for the same.  On day of admission, she had completed 5 days of levaquin.  She was admitted on 4/22 for further evaluation of non-resolving PNA. Speech therapy was consulted for evaluation.  She was cleared for regular diet with thin liquids.  There were concerns for primary esophageal dysphagia.  She was recommended for MBS.  CTA chest was completed which was negative for PE, but showed signs of chronic interstitial lung disease with bilateral peripheral predominant and lower lung interstitial reticulation, ground glass attenuation, architectural distortion and bronchiectasis. Multifocal bilateral consolidation, most severe in the posterolateral RUL with a large area of airspace consolidation 8x3.9cm, increased caliber of the main pulmonary artery compatible with PA hypertension.   PCCM consulted for pulmonary evaluation.   Pertinent  Medical History  Anxiety  Bradycardia  Breast Cancer  Dementia HTN  HLD  Unequal Leg Length, Numbness of Left LE DJD  Significant Hospital Events: Including procedures,  antibiotic start and stop dates in addition to other pertinent events   4/22 Admit with weakness, slow to resolve PNA 4/23 PCCM consulted  Interim History / Subjective:  No distress, appetite better  Objective   Blood pressure (!) 185/157, pulse 80, temperature 97.8 F (36.6 C), temperature source Oral, resp. rate 18, height  (1.626 m), weight 61.7 kg, SpO2 92 %.        Intake/Output Summary (Last 24 hours) at 05/12/2022 1559 Last data filed at 05/12/2022 1300 Gross per 24 hour  Intake 1358.16 ml  Output 600 ml  Net 758.16 ml    Filed Weights   05/09/22 1129  Weight: 61.7 kg    Examination: General 89 year old female sitting up in bed no distress HENT NCAT no JVD Pulm posterior crackles   Card rrr Abd soft Ext warm  Neuro awake, pleasant. Oriented X 3 but has difficulty w/ memory     Resolved Hospital Problem list     Assessment & Plan:   Abnormal chest x-imaging: Posterior right upper lobe dense infiltrate likely reflective of acute aspiration pneumonia.  Likely has received adequate antibiotic therapy with lingering symptoms as well as lingering imaging findings.  Likely more severe symptoms in the setting of her advanced age as well as deconditioning being less active over the last few weeks.  She also has chronic findings as discussed below. -- Okay to continue antibiotics -- Repeat CT scan as outpatient, will arrange outpatient follow-up   ILD: Initial signs on 2018 with worsening signs now in 2024.  Given the history, this likely reflects chronic aspiration  pneumonitis as well as bronchiectasis related to this. -- Serologic workup, suspect inflammatory markers elevated in setting of pneumonia   Hypoxemia: Wean oxygen as tolerated, goal O2 sat 90%. O2 sat no lower than 97% on 2 L documented.  Suspect we could wean hopefully to off.  PCCM will sign off  Best Practice (right click and "Reselect all SmartList Selections" daily)  Per [facility]

## 2022-05-12 NOTE — Plan of Care (Signed)

## 2022-05-12 NOTE — Progress Notes (Signed)
Mobility Specialist - Progress Note   05/12/22 1528  Mobility  Activity Ambulated with assistance to bathroom  Level of Assistance Standby assist, set-up cues, supervision of patient - no hands on  Assistive Device Front wheel walker  Distance Ambulated (ft) 15 ft  Activity Response Tolerated well  Mobility Referral Yes  $Mobility charge 1 Mobility   Pt received in bed requesting assistance to bathroom. No complaints during session. Instructed pt to pull call bell when finished. NT made aware. Pt to bathroom with all needs met.    [facility]

## 2022-05-12 NOTE — Care Management Important Message (Signed)
Important Message  Patient Details IM Letter given. Name: [name] [name] MRN: [id number] Date of Birth: October 21, 1930   Medicare Important Message Given:  Yes     Caren Macadam 05/12/2022, 11:28 AM

## 2022-05-12 NOTE — Progress Notes (Signed)
PROGRESS NOTE  DEA BITTING  ZHY:[id number] DOB: 02/10/1930 DOA: 05/09/2022 PCP: Rodrigo Ran, MD   Brief Narrative: Patient  is a 89 y.o. female with medical history significant of hypertension, hyperlipidemia, breast cancer in remission, anxiety, COPD who was brought by her niece from friends home for follow-up of pneumonia.  Patient was diagnosed with pneumonia by her PCP about 3 to 4 weeks ago and was put on oral antibiotics.  Initially she was started on Augmentin which did not help.  Follow-up chest x-ray showed unimproved pneumonia and she was started on Levaquin.   Patient remained short of breath, weak mostly when she ambulates . Chest imaging showed multifocal pneumonia/ILD like picture.  Pulmonology consulted .  MBS showed significant esophageal dysphagia, so likely this is all from chronic aspiration.  PT recommended home health on discharge but family requesting for SNF.  PT evaluation requested.  She might qualify for  home oxygen  Assessment & Plan:  Principal Problem:   PNA (pneumonia) Active Problems:   Hypertension   Depression   COPD (chronic obstructive pulmonary disease)   Breast cancer  Multifocal pneumonia: Patient was recently diagnosed with community-acquired pneumonia by her PCP.  Treated with azithromycin without benefit so antibiotics changed to Levaquin.  No fever or chills.  No leukocytosis. Lactate level normal.  Negative procalcitonin. Negative COVID screening.  Respiratory viral panel pending Patient was started on broad-spectrum antibiotics vancomycin and cefepime. Cultures now growth till date.   Chest imaging showed features of ILD, multifocal bilateral consolidations.  No pulmonary embolism. MBS showed significant esophageal dysphagia, DG esophagram negative. Pulmonary team thinks that this is most likely from chronic aspiration.  Autoimmune panel sent most of them are negative except for ANA, rheumatoid factor, ESR and CRP.  She will be followed by  pulmonology as an outpatient. Needs to have small volume meals at a time, sitting upright position while eating and sitting for at least 1 hour after meals. Antibiotic changed to Augmentin today  Acute hypoxic aspiratory failure: Patient desaturates on minimal ambulation.  Will check if she qualifies for home oxygen  Hypertension: Bp soft.  Antihypertensives on hold  History of COPD: Continue bronchodilators as needed.  Does not use supplemental oxygen at home.  Past smoker   Dysphagia: Plan as above.  Speech therapy recommending solid diet, thin liquid   Hyperlipidemia: Takes Lipitor   History of depression: On Lexapro.  History of dementia: Mostly remains alert and oriented.  On galantamine, memantine   History of left breast cancer: Currently in remission   Elevated BNP: Echo showed EF of 50 to 55%, grade 1 diastolic dysfunction.  Currently appears euvolemic.   History of GERD: Continue Protonix   Goals of care: Elderly female with multifocal pneumonia.  CODE STATUS is DNR.  Confirmed with the niece.   Disposition: PT recommending home health but patient's family are very concerned.  She is demented, lives alone and now mild to wear oxygen on discharge.  They are requesting for SNF.  PT reevaluation requested.  TOC notified          Pressure Injury 05/10/22 Sacrum Stage 1 -  Intact skin with non-blanchable redness of a localized area usually over a bony prominence. (Active)  05/10/22 1500  Location: Sacrum  Location Orientation:   Staging: Stage 1 -  Intact skin with non-blanchable redness of a localized area usually over a bony prominence.  Wound Description (Comments):   Present on Admission:   Dressing Type Foam - Lift dressing  to assess site every shift 05/12/22 0812    DVT prophylaxis:enoxaparin (LOVENOX) injection 40 mg Start: 05/09/22 2200     Code Status: DNR  Family Communication: Discussed with niece at bedside.  Called nephew separately on phones  today  Patient status:Inpatient  Patient is from :Home  Anticipated discharge to:SNF  Estimated DC date: Pending SNF availability   Consultants: PCCM  Procedures: MBS  Antimicrobials:  Anti-infectives (From admission, onward)    Start     Dose/Rate Route Frequency Ordered Stop   05/10/22 2200  vancomycin (VANCOREADY) IVPB 750 mg/150 mL  Status:  Discontinued        750 mg 150 mL/hr over 60 Minutes Intravenous Every 24 hours 05/09/22 1736 05/10/22 0948   05/09/22 2200  ceFEPIme (MAXIPIME) 2 g in sodium chloride 0.9 % 100 mL IVPB        2 g 200 mL/hr over 30 Minutes Intravenous Every 12 hours 05/09/22 1736     05/09/22 1745  vancomycin (VANCOCIN) IVPB 1000 mg/200 mL premix        1,000 mg 200 mL/hr over 60 Minutes Intravenous  Once 05/09/22 1736 05/09/22 2306   05/09/22 1515  cefTRIAXone (ROCEPHIN) 1 g in sodium chloride 0.9 % 100 mL IVPB  Status:  Discontinued        1 g 200 mL/hr over 30 Minutes Intravenous Every 24 hours 05/09/22 1506 05/09/22 1728       Subjective: Patient seen and examined at bedside today.  Hemodynamically stable.  Still comfortable.  Sitting on the bed and was on nebulizing treatment.  Nephew at bedside.  She is concerned about patient mobility and requirement of oxygen.  Requesting SNF.  Objective: Vitals:   05/11/22 0754 05/11/22 1206 05/11/22 1950 05/12/22 0524  BP:  118/73 109/66 (!) 147/71  Pulse: 68 (!) 59 71 60  Resp: 16 14 20 20   Temp:  98.3 F (36.8 C) (!) 97.4 F (36.3 C) 98 F (36.7 C)  TempSrc:  Oral    SpO2: 99% 100% 95% 100%  Weight:      Height:        Intake/Output Summary (Last 24 hours) at 05/12/2022 1148 Last data filed at 05/12/2022 1009 Gross per 24 hour  Intake 1118.16 ml  Output 500 ml  Net 618.16 ml   Filed Weights   05/09/22 1129  Weight: 61.7 kg    Examination:  General exam: Overall comfortable, not in distress, pleasant elderly female HEENT: PERRL Respiratory system:  no wheezes or crackles  ,scattered  coarse breath sounds Cardiovascular system: S1 & S2 heard, RRR.  Gastrointestinal system: Abdomen is nondistended, soft and nontender. Central nervous system: Alert and oriented Extremities: No edema, no clubbing ,no cyanosis Skin: No rashes, no ulcers,no icterus     Data Reviewed: I have personally reviewed following labs and imaging studies  CBC: Recent Labs  Lab 05/09/22 1525 05/10/22 0452  WBC 10.1 7.9  NEUTROABS 7.9*  --   HGB 12.8 10.8*  HCT 39.8 32.9*  MCV 99.0 99.1  PLT 268 201   Basic Metabolic Panel: Recent Labs  Lab 05/09/22 1205 05/10/22 0452  NA 131* 132*  K 3.6 3.6  CL 96* 99  CO2 26 24  GLUCOSE 94 93  BUN 15 15  CREATININE 0.80 0.78  CALCIUM 8.4* 8.2*     Recent Results (from the past 240 hour(s))  Blood culture (routine x 2)     Status: None (Preliminary result)   Collection Time: 05/09/22  3:25 PM  Specimen: Left Antecubital; Blood  Result Value Ref Range Status   Specimen Description   Final    LEFT ANTECUBITAL BLOOD Performed at [facility], [address], [place], [place] [address]    Special Requests   Final    BOTTLES DRAWN AEROBIC AND ANAEROBIC Blood Culture results may not be optimal due to an excessive volume of blood received in culture bottles Performed at [facility], [address] [place], [place] [address]    Culture   Final    NO GROWTH 3 DAYS Performed at [facility], [address] [place], [place] [address]    Report Status PENDING  Incomplete  Blood culture (routine x 2)     Status: None (Preliminary result)   Collection Time: 05/09/22  3:25 PM   Specimen: BLOOD  Result Value Ref Range Status   Specimen Description   Final    BLOOD SPECIMEN SOURCE NOT MARKED ON REQUISITION Performed at [facility], [address] [place], [place] [address]    Special Requests   Final    BOTTLES DRAWN AEROBIC AND ANAEROBIC Blood Culture results may not be optimal  due to an excessive volume of blood received in culture bottles Performed at [facility], [address] [place], [place] [address]    Culture   Final    NO GROWTH 3 DAYS Performed at [facility], [address] [place], [place] [address]    Report Status PENDING  Incomplete  SARS Coronavirus 2 by RT PCR (hospital order, performed in [facility] hospital lab) *cepheid single result test* Anterior Nasal Swab     Status: None   Collection Time: 05/10/22  2:01 AM   Specimen: Anterior Nasal Swab  Result Value Ref Range Status   SARS Coronavirus 2 by RT PCR NEGATIVE NEGATIVE Final    Comment: (NOTE) SARS-CoV-2 target nucleic acids are NOT DETECTED.  The SARS-CoV-2 RNA is generally detectable in upper and lower respiratory specimens during the acute phase of infection. The lowest concentration of SARS-CoV-2 viral copies this assay can detect is 250 copies / mL. A negative result does not preclude SARS-CoV-2 infection and should not be used as the sole basis for treatment or other patient management decisions.  A negative result may occur with improper specimen collection / handling, submission of specimen other than nasopharyngeal swab, presence of viral mutation(s) within the areas targeted by this assay, and inadequate number of viral copies (<250 copies / mL). A negative result must be combined with clinical observations, patient history, and epidemiological information.  Fact Sheet for Patients:   RoadLapTop.co.za  Fact Sheet for Healthcare Providers: http://kim-miller.com/  This test is not yet approved or  cleared by the [place] FDA and has been authorized for detection and/or diagnosis of SARS-CoV-2 by FDA under an Emergency Use Authorization (EUA).  This EUA will remain in effect (meaning this test can be used) for the duration of the COVID-19 declaration under Section 564(b)(1) of the Act, 21  U.S.C. section 360bbb-3(b)(1), unless the authorization is terminated or revoked sooner.  Performed at [facility], [address] [place], [place] [address]   MRSA Next Gen by PCR, Nasal     Status: None   Collection Time: 05/10/22  2:01 AM   Specimen: Nasal Mucosa; Nasal Swab  Result Value Ref Range Status   MRSA by PCR Next Gen NOT DETECTED NOT DETECTED Final    Comment: (NOTE)  The GeneXpert MRSA Assay (FDA approved for NASAL specimens only), is one component of a comprehensive MRSA colonization surveillance program. It is not intended to diagnose MRSA infection nor to guide or monitor treatment for MRSA infections. Test performance is not FDA approved in patients less than 79 years old. Performed at [facility], [address] [place], [place] [address]   Respiratory (~20 pathogens) panel by PCR     Status: None   Collection Time: 05/10/22 10:26 AM   Specimen: Nasopharyngeal Swab; Respiratory  Result Value Ref Range Status   Adenovirus NOT DETECTED NOT DETECTED Final   Coronavirus 229E NOT DETECTED NOT DETECTED Final    Comment: (NOTE) The Coronavirus on the Respiratory Panel, DOES NOT test for the novel  Coronavirus (2019 nCoV)    Coronavirus HKU1 NOT DETECTED NOT DETECTED Final   Coronavirus NL63 NOT DETECTED NOT DETECTED Final   Coronavirus OC43 NOT DETECTED NOT DETECTED Final   Metapneumovirus NOT DETECTED NOT DETECTED Final   Rhinovirus / Enterovirus NOT DETECTED NOT DETECTED Final   Influenza A NOT DETECTED NOT DETECTED Final   Influenza B NOT DETECTED NOT DETECTED Final   Parainfluenza Virus 1 NOT DETECTED NOT DETECTED Final   Parainfluenza Virus 2 NOT DETECTED NOT DETECTED Final   Parainfluenza Virus 3 NOT DETECTED NOT DETECTED Final   Parainfluenza Virus 4 NOT DETECTED NOT DETECTED Final   Respiratory Syncytial Virus NOT DETECTED NOT DETECTED Final   Bordetella pertussis NOT DETECTED NOT DETECTED Final   Bordetella  Parapertussis NOT DETECTED NOT DETECTED Final   Chlamydophila pneumoniae NOT DETECTED NOT DETECTED Final   Mycoplasma pneumoniae NOT DETECTED NOT DETECTED Final    Comment: Performed at [facility], [address], [place], [place] [address]     Radiology Studies: DG ESOPHAGUS W SINGLE CM (SOL OR THIN BA)  Result Date: 05/11/2022 CLINICAL DATA:  Dysphasia.  Concern for aspiration. EXAM: ESOPHOGRAM/BARIUM SWALLOW TECHNIQUE: Single contrast examination was performed using  thin barium. FLUOROSCOPY: Radiation Exposure Index (as provided by the fluoroscopic device): 3.1 mGy mGy Kerma COMPARISON:  Modified barium swallow from previous day FINDINGS: The oral and oropharyngeal function was normal on the oropharyngeal motility study from the prior day. The patient took sequential sips thin barium in the upright LPO orientation. No signs of aspiration, esophageal stricture or mass. The esophagus empties normally. Mild esophageal dysmotility noted with diminished primary peristalsis. IMPRESSION: 1. No evidence for aspiration, esophageal stricture or mass. Normal emptying of the esophagus in the upright orientation. 2. Mild esophageal dysmotility with diminished primary peristalsis. Electronically Signed   By: Signa Kell M.D.   On: 05/11/2022 13:09   DG Swallowing Func-Speech Pathology  Result Date: 05/10/2022 Table formatting from the original result was not included. Modified Barium Swallow Study Patient Details Name: EMERSYN WYSS MRN: [id number] Date of Birth: December 13, 1930 Today's Date: 05/10/2022 HPI/PMH: HPI: NAEVIA UNTERREINER is a 89 y.o. female with medical history significant of hypertension, hyperlipidemia, breast cancer in remission, anxiety, COPD who was brought by her niece from friends home for follow-up of pneumonia.  Patient was diagnosed with pneumonia by her PCP about 3 to 4 weeks ago and was put on oral antibiotics.  Initially she was started on Augmentin which did not help.  Follow-up  chest x-ray showed unimproved pneumonia and she was started on Levaquin.  Currently she is on her fifth day of the course.  Patient has being short of breath, weak mostly when she ambulates . Clinical Impression:  Pt demonstrates signs of significant esophageal dysphagia. Oral and oropharyngeal function WNL. After a few sips of liquids and puree pt made and face and reported "its not going down." Observed pt masticate and cracker and exhibit effortful swallow without oral or oropharyngeal impairment, then swept to the esophagus with severe distal residue without observable persitalsis. Liquid wash did not clear stasis. Pill not given. Pt denies regurgitation but is at risk for postprandial aspiration, particularly if laying in bed.  Possible that this is contributing to recurrent pna. Keep HOB at 30 degrees or higher. Crush medication. Mobilize pt after meals. Use liquid dietary supplements. No benefit of modifying solid texture as pt has severe residue with puree and thin. Continue regular diet and thin liquids. Pt may benefit from f/u with GI depending on pt/family desire to address problem medically. Will f/u for further education with family. Pt is unable to result of test or strategies. Called Niece and left message. Factors that may increase risk of adverse event in presence of aspiration Rubye Oaks & Clearance Coots 2021): Factors that may increase risk of adverse event in presence of aspiration Rubye Oaks & Clearance Coots 2021): Reduced cognitive function; Frail or deconditioned; Aspiration of thick, dense, and/or acidic materials Recommendations/Plan: Swallowing Evaluation Recommendations Swallowing Evaluation Recommendations Recommendations: PO diet PO Diet Recommendation: Regular; Thin liquids (Level 0) Liquid Administration via: Cup; Straw Medication Administration: Whole meds with liquid Supervision: Patient able to self-feed Swallowing strategies  : Slow rate; Small bites/sips; Follow solids with liquids Postural changes:  Out of bed for meals (walk a bit after meals) Oral care recommendations: Oral care BID (2x/day) Recommended consults: Consider GI consultation Treatment Plan Treatment Plan Treatment recommendations: Therapy as outlined in treatment plan below Follow-up recommendations: Home health SLP Functional status assessment: Patient has had a recent decline in their functional status and demonstrates the ability to make significant improvements in function in a reasonable and predictable amount of time. Treatment frequency: Min 2x/week Treatment duration: 1 week Interventions: Aspiration precaution training; Patient/family education; Compensatory techniques Recommendations Recommendations for follow up therapy are one component of a multi-disciplinary discharge planning process, led by the attending physician.  Recommendations may be updated based on patient status, additional functional criteria and insurance authorization. Assessment: Orofacial Exam: Orofacial Exam Oral Cavity - Dentition: Adequate natural dentition Anatomy: No data recorded Boluses Administered: Boluses Administered Boluses Administered: Thin liquids (Level 0); Mildly thick liquids (Level 2, nectar thick); Moderately thick liquids (Level 3, honey thick); Puree; Solid  Oral Impairment Domain: Oral Impairment Domain Lip Closure: No labial escape Tongue control during bolus hold: Cohesive bolus between tongue to palatal seal Bolus preparation/mastication: Slow prolonged chewing/mashing with complete recollection Bolus transport/lingual motion: Delayed initiation of tongue motion (oral holding) Oral residue: Complete oral clearance Location of oral residue : N/A  Pharyngeal Impairment Domain: Pharyngeal Impairment Domain Soft palate elevation: No bolus between soft palate (SP)/pharyngeal wall (PW) Laryngeal elevation: Complete superior movement of thyroid cartilage with complete approximation of arytenoids to epiglottic petiole Anterior hyoid excursion:  Complete anterior movement Epiglottic movement: Complete inversion Laryngeal vestibule closure: Complete, no air/contrast in laryngeal vestibule Pharyngeal stripping wave : Present - complete Pharyngeal contraction (A/P view only): N/A Pharyngoesophageal segment opening: Partial distention/partial duration, partial obstruction of flow Pharyngeal residue: Trace residue within or on pharyngeal structures Location of pharyngeal residue: Pharyngeal wall; Pyriform sinuses  Esophageal Impairment Domain: Esophageal Impairment Domain Esophageal clearance upright position: Esophageal retention Pill: Esophageal Impairment Domain Esophageal clearance upright position: Esophageal retention Penetration/Aspiration Scale Score: Penetration/Aspiration Scale Score 1.  Material  does not enter airway: Thin liquids (Level 0); Mildly thick liquids (Level 2, nectar thick); Moderately thick liquids (Level 3, honey thick); Solid; Puree Compensatory Strategies: Compensatory Strategies Compensatory strategies: Yes Liquid wash: Ineffective   General Information: Caregiver present: No  Diet Prior to this Study: Regular; Thin liquids (Level 0)   Temperature : Normal   Respiratory Status: WFL   Supplemental O2: Nasal cannula   No data recorded Behavior/Cognition: Alert; Cooperative; Pleasant mood Self-Feeding Abilities: Able to self-feed Baseline vocal quality/speech: Normal Volitional Cough: Able to elicit Volitional Swallow: Able to elicit No data recorded Goal Planning: Prognosis for improved oropharyngeal function: Guarded Barriers to Reach Goals: Severity of deficits No data recorded No data recorded No data recorded Pain: No data recorded End of Session: Start Time:SLP Start Time (ACUTE ONLY): 1400 Stop Time: SLP Stop Time (ACUTE ONLY): 1415 Time Calculation:SLP Time Calculation (min) (ACUTE ONLY): 15 min Charges: SLP Evaluations $ SLP Speech Visit: 1 Visit SLP Evaluations $BSS Swallow: 1 Procedure $MBS Swallow: 1 Procedure SLP visit  diagnosis: SLP Visit Diagnosis: Dysphagia, unspecified (R13.10) Past Medical History: Past Medical History: Diagnosis Date  Anxiety   Bradycardia   asymptomatic  Breast CA 1993 OR 1994  LEFT, SURGERY AND RADIATION DONE  COPD (chronic obstructive pulmonary disease)   MILD, NO INHALERS USED  DEGENERATIVE JOINT DISEASE, KNEE   Family history of anesthesia complication   NEPHEW NAUSEA/VOMITING  GENU VALGUM   Hyperlipidemia   Hypertension   Numbness of leg 2011  just left shin  OTHER ACQUIRED DEFORMITY OF ANKLE AND FOOT OTHER   Pneumonia 2013  X 2  PVC's (premature ventricular contractions)   ROTATOR CUFF SYNDROME, LEFT   CANNOT LIFT LEFT ARM ALL THE WAY UP  UNEQUAL LEG LENGTH   UTI (urinary tract infection)   STARTED AUGMENTIN  ON 09-10-13 Past Surgical History: Past Surgical History: Procedure Laterality Date  APPENDECTOMY  AGE 67  BREAST LUMPECTOMY Left 1991  radiation  BREAST SURGERY Left 1993 OR 1994  LUMPECTOMY AND RADIATION DONE  JOINT REPLACEMENT    NASAL SINUS SURGERY  2006  REVERSE SHOULDER ARTHROPLASTY Left 07/31/2015  Procedure: LEFT REVERSE SHOULDER ARTHROPLASTY;  Surgeon: Beverely Low, MD;  Location: [facility] OR;  Service: Orthopedics;  Laterality: Left;  TOTAL HIP ARTHROPLASTY Bilateral LEFT 2008 AND RIGHT 2010  TOTAL KNEE ARTHROPLASTY Left 09/17/2013  Procedure: LEFT TOTAL KNEE ARTHROPLASTY;  Surgeon: Shelda Pal, MD;  Location: [facility] ORS;  Service: Orthopedics;  Laterality: Left; DeBlois, Riley Nearing 05/10/2022, 2:41 PM  ECHOCARDIOGRAM COMPLETE  Result Date: 05/10/2022    ECHOCARDIOGRAM REPORT   Patient Name:   LAKYRA TIPPINS Sidman Date of Exam: 05/10/2022 Medical Rec #:  [id number]        Height:       64.0 in Accession #:    [id number]       Weight:       136.0 lb Date of Birth:  04/12/1930         BSA:          1.661 m Patient Age:    89 years         BP:           116/68 mmHg Patient Gender: F                HR:           58 bpm. Exam Location:  Inpatient Procedure: 2D Echo, Cardiac Doppler and Color Doppler  Indications:    Acute respiratory distress  History:        Patient has no prior history of Echocardiogram examinations.                 COPD; Risk Factors:Hypertension and Dyslipidemia.  Sonographer:    Mike Gip Referring Phys: [id number] Coreena Rubalcava  Sonographer Comments: Technically difficult study due to poor echo windows. Image acquisition challenging due to breast implants. IMPRESSIONS  1. Left ventricular ejection fraction, by estimation, is 50 to 55%. The left ventricle has low normal function. The left ventricle has no regional wall motion abnormalities. Left ventricular diastolic parameters are consistent with Grade I diastolic dysfunction (impaired relaxation).  2. Right ventricular systolic function is normal. The right ventricular size is mildly enlarged. There is mildly elevated pulmonary artery systolic pressure. The estimated right ventricular systolic pressure is 44.0 mmHg.  3. Right atrial size was mildly dilated.  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 Trivial mitral valve regurgitation. No evidence of mitral stenosis. There is mild late systolic prolapse of both leaflets of the mitral valve.  5. The tricuspid valve is myxomatous. Tricuspid valve regurgitation is mild to moderate.  6. The aortic valve is tricuspid. There is mild calcification of the aortic valve. Aortic valve regurgitation is mild to moderate. Aortic valve sclerosis is present, with no evidence of aortic valve stenosis.  7. The inferior vena cava is dilated in size with >50% respiratory variability, suggesting right atrial pressure of 8 mmHg. FINDINGS  Left Ventricle: Left ventricular ejection fraction, by estimation, is 50 to 55%. The left ventricle has low normal function. The left ventricle has no regional wall motion abnormalities. The left ventricular internal cavity size was normal in size. There is no left ventricular hypertrophy. Left ventricular diastolic parameters are consistent with Grade I diastolic dysfunction  (impaired relaxation). Normal left ventricular filling pressure. Right Ventricle: The right ventricular size is mildly enlarged. No increase in right ventricular wall thickness. Right ventricular systolic function is normal. There is mildly elevated pulmonary artery systolic pressure. The tricuspid regurgitant velocity is 3.00 m/s, and with an assumed right atrial pressure of 8 mmHg, the estimated right ventricular systolic pressure is 44.0 mmHg. Left Atrium: Left atrial size was normal in size. Right Atrium: Right atrial size was mildly dilated. Pericardium: There is no evidence of pericardial effusion. Mitral Valve: The mitral valve is myxomatous. There is mild late systolic prolapse of both leaflets of the mitral valve. Trivial mitral valve regurgitation. No evidence of mitral valve stenosis. Tricuspid Valve: The tricuspid valve is myxomatous. Tricuspid valve regurgitation is mild to moderate. No evidence of tricuspid stenosis. There is mild prolapse of the tricuspid. Aortic Valve: The aortic valve is tricuspid. There is mild calcification of the aortic valve. Aortic valve regurgitation is mild to moderate. Aortic regurgitation PHT measures 555 msec. Aortic valve sclerosis is present, with no evidence of aortic valve stenosis. Pulmonic Valve: The pulmonic valve was normal in structure. Pulmonic valve regurgitation is mild. No evidence of pulmonic stenosis. Aorta: The aortic root is normal in size and structure. Venous: The inferior vena cava is dilated in size with greater than 50% respiratory variability, suggesting right atrial pressure of 8 mmHg. IAS/Shunts: No atrial level shunt detected by color flow Doppler.  LEFT VENTRICLE PLAX 2D LVIDd:         4.90 cm     Diastology LVIDs:         3.70 cm     LV e' medial:    4.57 cm/s LV PW:  0.90 cm     LV E/e' medial:  10.4 LV IVS:        1.00 cm     LV e' lateral:   9.14 cm/s LVOT diam:     2.00 cm     LV E/e' lateral: 5.2 LV SV:         47 LV SV Index:   28  LVOT Area:     3.14 cm  LV Volumes (MOD) LV vol d, MOD A2C: 92.6 ml LV vol d, MOD A4C: 89.6 ml LV vol s, MOD A2C: 46.1 ml LV vol s, MOD A4C: 44.5 ml LV SV MOD A2C:     46.5 ml LV SV MOD A4C:     89.6 ml LV SV MOD BP:      48.1 ml RIGHT VENTRICLE             IVC RV Basal diam:  5.00 cm     IVC diam: 2.30 cm RV S prime:     12.60 cm/s TAPSE (M-mode): 1.8 cm LEFT ATRIUM             Index        RIGHT ATRIUM           Index LA diam:        2.00 cm 1.20 cm/m   RA Area:     21.80 cm LA Vol (A2C):   62.2 ml 37.45 ml/m  RA Volume:   80.70 ml  48.59 ml/m LA Vol (A4C):   41.1 ml 24.75 ml/m LA Biplane Vol: 50.5 ml 30.41 ml/m  AORTIC VALVE                   PULMONIC VALVE LVOT Vmax:         72.50 cm/s  PR End Diast Vel: 2.46 msec LVOT Vmean:        44.800 cm/s LVOT VTI:          0.149 m AI PHT:            555 msec AR Vena Contracta: 0.40 cm  AORTA Ao Root diam: 3.10 cm Ao Asc diam:  3.40 cm MITRAL VALVE               TRICUSPID VALVE MV Area (PHT): 5.97 cm    TR Peak grad:   36.0 mmHg MV Decel Time: 127 msec    TR Vmax:        300.00 cm/s MV E velocity: 47.60 cm/s MV A velocity: 53.60 cm/s  SHUNTS MV E/A ratio:  0.89        Systemic VTI:  0.15 m                            Systemic Diam: 2.00 cm Mihai Croitoru MD Electronically signed by Thurmon Fair MD Signature Date/Time: 05/10/2022/12:28:25 PM    Final     Scheduled Meds:  atorvastatin  20 mg Oral QHS   enoxaparin (LOVENOX) injection  40 mg Subcutaneous Q24H   escitalopram  20 mg Oral q morning   feeding supplement  237 mL Oral TID BM   galantamine  16 mg Oral Q breakfast   guaiFENesin  1,200 mg Oral BID   memantine  28 mg Oral Daily   pantoprazole  40 mg Oral Daily   [START ON 05/13/2022] pneumococcal 20-valent conjugate vaccine  0.5 mL Intramuscular Tomorrow-1000   sodium chloride HYPERTONIC  4 mL Nebulization BID  Continuous Infusions:  ceFEPime (MAXIPIME) IV 2 g (05/12/22 0920)     LOS: 3 days   Burnadette Pop, MD [facility] P4/25/2024, 11:48 AM

## 2022-05-12 NOTE — TOC Progression Note (Signed)
Transition of Care [facility]) - Progression Note    Patient Details  Name: YAZMYN VALBUENA MRN: [id number] Date of Birth: 05-06-1930  Transition of Care [facility]) CM/SW Contact  Howell Rucks, RN Phone Number: 05/12/2022, 11:38 AM  Clinical Narrative:  Call received from Katie with  [facility]( [phone])-recc PT to re eval since noted major decline in mobility today from yesterday. Request sent to team. Will continue to follow.          Expected Discharge Plan and Services                                               Social Determinants of Health (SDOH) Interventions SDOH Screenings   Food Insecurity: No Food Insecurity (05/10/2022)  Housing: Low Risk  (05/10/2022)  Transportation Needs: No Transportation Needs (05/10/2022)  Utilities: Not At Risk (05/10/2022)  Tobacco Use: Medium Risk (05/10/2022)    Readmission Risk Interventions    05/11/2022    4:19 PM  Readmission Risk Prevention Plan  Post Dischage Appt Complete  Transportation Screening Complete

## 2022-05-12 NOTE — Progress Notes (Signed)
Mobility Specialist - Progress Note   05/12/22 1119  Mobility  Activity Ambulated with assistance to bathroom  Level of Assistance Standby assist, set-up cues, supervision of patient - no hands on  Assistive Device Front wheel walker  Distance Ambulated (ft) 15 ft  Activity Response Tolerated well  Mobility Referral Yes  $Mobility charge 1 Mobility   Pt received in recliner requesting assistance to bathroom. Pt was MinA from sit>stand. When returning to recliner pt c/o feeling SOB. O2 checked. Pt opted out to walk this afternoon after lunch. Pt to recliner after session with all needs met & chair alarm on.    [facility]

## 2022-05-13 DIAGNOSIS — B59 Pneumocystosis: Secondary | ICD-10-CM

## 2022-05-13 DIAGNOSIS — R072 Precordial pain: Secondary | ICD-10-CM

## 2022-05-13 LAB — CBC
HCT: 33.5 % — ABNORMAL LOW (ref 36.0–46.0)
Hemoglobin: 10.8 g/dL — ABNORMAL LOW (ref 12.0–15.0)
MCH: 31.9 pg (ref 26.0–34.0)
MCHC: 32.2 g/dL (ref 30.0–36.0)
MCV: 98.8 fL (ref 80.0–100.0)
Platelets: 198 10*3/uL (ref 150–400)
RBC: 3.39 MIL/uL — ABNORMAL LOW (ref 3.87–5.11)
RDW: 13.8 % (ref 11.5–15.5)
WBC: 6.3 10*3/uL (ref 4.0–10.5)
nRBC: 0 % (ref 0.0–0.2)

## 2022-05-13 LAB — BASIC METABOLIC PANEL
Anion gap: 7 (ref 5–15)
BUN: 11 mg/dL (ref 8–23)
CO2: 27 mmol/L (ref 22–32)
Calcium: 8.3 mg/dL — ABNORMAL LOW (ref 8.9–10.3)
Chloride: 100 mmol/L (ref 98–111)
Creatinine, Ser: 0.74 mg/dL (ref 0.44–1.00)
GFR, Estimated: >60 mL/min (ref >60)
Glucose, Bld: 96 mg/dL (ref 70–99)
Potassium: 3.8 mmol/L (ref 3.5–5.1)
Sodium: 134 mmol/L — ABNORMAL LOW (ref 135–145)

## 2022-05-13 LAB — CULTURE, BLOOD (ROUTINE X 2)

## 2022-05-13 LAB — TROPONIN I (HIGH SENSITIVITY)
Troponin I (High Sensitivity): 7 ng/L (ref <18)
Troponin I (High Sensitivity): 7 ng/L (ref <18)

## 2022-05-13 NOTE — Progress Notes (Signed)
PROGRESS NOTE  [name] [name]  ZOX:[id number] DOB: Oct 13, 1930 DOA: 05/09/2022 PCP: Rodrigo Ran, MD   Brief Narrative: Patient  is a 89 y.o. female with medical history significant of hypertension, hyperlipidemia, breast cancer in remission, anxiety, COPD who [facility].  Patient was diagnosed with pneumonia by her PCP about 3 to 4 weeks ago and was put on oral antibiotics.  Initially she was started on Augmentin which did not help.  Follow-up chest x-ray showed unimproved pneumonia and she was started on Levaquin.   Patient remained short of breath, weak mostly when she ambulates . Chest imaging showed multifocal pneumonia/ILD like picture.  Pulmonology consulted .  MBS showed significant esophageal dysphagia, so likely this is all from chronic aspiration.  PT recommended home health on discharge but family requesting for SNF.  PT evaluation requested.  She might qualify for  home oxygen  05/13/2022: Patient was said to have reported chest pain earlier (according to the patient's nurse).  Stat EKG and troponins ordered.  Patient seen.  Patient denies having any current chest pain.  Will follow EKG and troponins.  Patient seems quite comfortable at the moment.  Will continue antibiotics.  Patient is currently on Augmentin.  Assessment & Plan:  Principal Problem:   PNA (pneumonia) Active Problems:   Hypertension   Depression   COPD (chronic obstructive pulmonary disease) (HCC)   Breast cancer (HCC)  Multifocal pneumonia: Patient was recently diagnosed with community-acquired pneumonia by her PCP.  Treated with azithromycin without benefit so antibiotics changed to Levaquin.  No fever or chills.  No leukocytosis. Lactate level normal.  Negative procalcitonin. Negative COVID screening.  Respiratory viral panel pending Patient was started on broad-spectrum antibiotics vancomycin and cefepime. Cultures now growth till date.   Chest imaging  showed features of ILD, multifocal bilateral consolidations.  No pulmonary embolism. MBS showed significant esophageal dysphagia, DG esophagram negative. Pulmonary team thinks that this is most likely from chronic aspiration.  Autoimmune panel sent most of them are negative except for ANA, rheumatoid factor, ESR and CRP.  She will be followed by pulmonology as an outpatient. Needs to have small volume meals at a time, sitting upright position while eating and sitting for at least 1 hour after meals. Antibiotic changed to Augmentin today 05/13/2022: Continue Augmentin.  Thin disease recommended.  Acute hypoxic aspiratory failure: Patient desaturates on minimal ambulation.  Will check if she qualifies for home oxygen  Hypertension: Bp soft.  Antihypertensives on hold 05/13/2022: Stable.  Current blood pressures 121/60 mmHg.  History of COPD: Continue bronchodilators as needed.  Does not use supplemental oxygen at home.  Past smoker   Dysphagia: Plan as above.  Speech therapy recommending solid diet, thin liquid   Hyperlipidemia: Takes Lipitor   History of depression: On Lexapro.  History of dementia: Mostly remains alert and oriented.  On galantamine, memantine   History of left breast cancer: Currently in remission   Elevated BNP: Echo showed EF of 50 to 55%, grade 1 diastolic dysfunction.  Currently appears euvolemic.   History of GERD: Continue Protonix  Reported chest pain: -Patient's nurse reported the patient complained of chest pain. -However, patient currently denies any history of chest pain. -Proceed with EKG and troponin. -Further management will depend on above.   Goals of care: Elderly female with multifocal pneumonia.  CODE STATUS is DNR.  Confirmed with the niece.   Disposition: PT recommending home health but patient's family are very  concerned.  She is demented, lives alone and now mild to wear oxygen on discharge.  They are requesting for SNF.  PT reevaluation  requested.  TOC notified          Pressure Injury 05/10/22 Sacrum Stage 1 -  Intact skin with non-blanchable redness of a localized area usually over a bony prominence. (Active)  05/10/22 1500  Location: Sacrum  Location Orientation:   Staging: Stage 1 -  Intact skin with non-blanchable redness of a localized area usually over a bony prominence.  Wound Description (Comments):   Present on Admission:   Dressing Type Foam - Lift dressing to assess site every shift 05/13/22 0757    DVT prophylaxis:enoxaparin (LOVENOX) injection 40 mg Start: 05/09/22 2200     Code Status: DNR  Family Communication:  Patient status:Inpatient  Patient is from :Home  Anticipated discharge to:SNF  Estimated DC date: Pending SNF availability   Consultants: PCCM  Procedures: MBS  Antimicrobials:  Anti-infectives (From admission, onward)    Start     Dose/Rate Route Frequency Ordered Stop   05/12/22 1400  amoxicillin-clavulanate (AUGMENTIN) 875-125 MG per tablet 1 tablet        1 tablet Oral Every 12 hours 05/12/22 1156 05/17/22 0959   05/10/22 2200  vancomycin (VANCOREADY) IVPB 750 mg/150 mL  Status:  Discontinued        750 mg 150 mL/hr over 60 Minutes Intravenous Every 24 hours 05/09/22 1736 05/10/22 0948   05/09/22 2200  ceFEPIme (MAXIPIME) 2 g in sodium chloride 0.9 % 100 mL IVPB  Status:  Discontinued        2 g 200 mL/hr over 30 Minutes Intravenous Every 12 hours 05/09/22 1736 05/12/22 1156   05/09/22 1745  vancomycin (VANCOCIN) IVPB 1000 mg/200 mL premix        1,000 mg 200 mL/hr over 60 Minutes Intravenous  Once 05/09/22 1736 05/09/22 2306   05/09/22 1515  cefTRIAXone (ROCEPHIN) 1 g in sodium chloride 0.9 % 100 mL IVPB  Status:  Discontinued        1 g 200 mL/hr over 30 Minutes Intravenous Every 24 hours 05/09/22 1506 05/09/22 1728       Subjective: Patient seen and examined at bedside today.  Hemodynamically stable.  Still comfortable.  Sitting on the bed and was on  nebulizing treatment.  Nephew at bedside.  She is concerned about patient mobility and requirement of oxygen.  Requesting SNF.  Objective: Vitals:   05/12/22 1935 05/12/22 2056 05/13/22 0519 05/13/22 0740  BP:  116/65 121/68   Pulse:  78 72   Resp:  16 16   Temp:  98.3 F (36.8 C) 98.1 F (36.7 C)   TempSrc:  Oral Oral   SpO2: 93% 97% 97% 92%  Weight:      Height:        Intake/Output Summary (Last 24 hours) at 05/13/2022 1029 Last data filed at 05/13/2022 0930 Gross per 24 hour  Intake 924 ml  Output 650 ml  Net 274 ml    Filed Weights   05/09/22 1129  Weight: 61.7 kg    Examination:  General exam: Overall comfortable, not in distress, pleasant elderly female HEENT: PERRL Respiratory system:  no wheezes or crackles ,scattered  coarse breath sounds Cardiovascular system: S1 & S2 heard, RRR.  Gastrointestinal system: Abdomen is nondistended, soft and nontender. Central nervous system: Awake and alert.  Patient moves all limbs.   Extremities: No edema.     Data Reviewed: I have  personally reviewed following labs and imaging studies  CBC: Recent Labs  Lab 05/09/22 1525 05/10/22 0452 05/13/22 0414  WBC 10.1 7.9 6.3  NEUTROABS 7.9*  --   --   HGB 12.8 10.8* 10.8*  HCT 39.8 32.9* 33.5*  MCV 99.0 99.1 98.8  PLT 268 201 198    Basic Metabolic Panel: Recent Labs  Lab 05/09/22 1205 05/10/22 0452 05/13/22 0414  NA 131* 132* 134*  K 3.6 3.6 3.8  CL 96* 99 100  CO2 26 24 27   GLUCOSE 94 93 96  BUN 15 15 11   CREATININE 0.80 0.78 0.74  CALCIUM 8.4* 8.2* 8.3*      Recent Results (from the past 240 hour(s))  Blood culture (routine x 2)     Status: None (Preliminary result)   Collection Time: 05/09/22  3:25 PM   Specimen: Left Antecubital; Blood  Result Value Ref Range Status   Specimen Description   Final    LEFT ANTECUBITAL BLOOD Performed at [facility], [address], [place], [place] [address]    Special Requests   Final    BOTTLES DRAWN  AEROBIC AND ANAEROBIC Blood Culture results may not be optimal due to an excessive volume of blood received in culture bottles Performed at [facility], [address], [place], [place] [address]    Culture   Final    NO GROWTH 4 DAYS Performed at [facility], [address], [place], [place] [address]    Report Status PENDING  Incomplete  Blood culture (routine x 2)     Status: None (Preliminary result)   Collection Time: 05/09/22  3:25 PM   Specimen: BLOOD  Result Value Ref Range Status   Specimen Description   Final    BLOOD SPECIMEN SOURCE NOT MARKED ON REQUISITION Performed at [facility], [address], [place], [place] [address]    Special Requests   Final    BOTTLES DRAWN AEROBIC AND ANAEROBIC Blood Culture results may not be optimal due to an excessive volume of blood received in culture bottles Performed at [facility], [address], [place], [place] [address]    Culture   Final    NO GROWTH 4 DAYS Performed at [facility], [address], [place], [place] [address]    Report Status PENDING  Incomplete  SARS Coronavirus 2 by RT PCR (hospital order, performed in [facility] hospital lab) *cepheid single result test* Anterior Nasal Swab     Status: None   Collection Time: 05/10/22  2:01 AM   Specimen: Anterior Nasal Swab  Result Value Ref Range Status   SARS Coronavirus 2 by RT PCR NEGATIVE NEGATIVE Final    Comment: (NOTE) SARS-CoV-2 target nucleic acids are NOT DETECTED.  The SARS-CoV-2 RNA is generally detectable in upper and lower respiratory specimens during the acute phase of infection. The lowest concentration of SARS-CoV-2 viral copies this assay can detect is 250 copies / mL. A negative result does not preclude SARS-CoV-2 infection and should not be used as the sole basis for treatment or other patient management decisions.  A negative result may occur with improper specimen collection  / handling, submission of specimen other than nasopharyngeal swab, presence of viral mutation(s) within the areas targeted by this assay, and inadequate number of viral copies (<250 copies / mL). A negative result must be combined with clinical observations, patient history, and epidemiological information.  Fact Sheet for Patients:  RoadLapTop.co.za  Fact Sheet for Healthcare Providers: http://kim-miller.com/  This test is not yet approved or  cleared by the [place] FDA and has been authorized for detection and/or diagnosis of SARS-CoV-2 by FDA under an Emergency Use Authorization (EUA).  This EUA will remain in effect (meaning this test can be used) for the duration of the COVID-19 declaration under Section 564(b)(1) of the Act, 21 U.S.C. section 360bbb-3(b)(1), unless the authorization is terminated or revoked sooner.  Performed at [facility], [address] [place], [place] [address]   MRSA Next Gen by PCR, Nasal     Status: None   Collection Time: 05/10/22  2:01 AM   Specimen: Nasal Mucosa; Nasal Swab  Result Value Ref Range Status   MRSA by PCR Next Gen NOT DETECTED NOT DETECTED Final    Comment: (NOTE) The GeneXpert MRSA Assay (FDA approved for NASAL specimens only), is one component of a comprehensive MRSA colonization surveillance program. It is not intended to diagnose MRSA infection nor to guide or monitor treatment for MRSA infections. Test performance is not FDA approved in patients less than 48 years old. Performed at [facility], [address], [place], [place] [address]   Respiratory (~20 pathogens) panel by PCR     Status: None   Collection Time: 05/10/22 10:26 AM   Specimen: Nasopharyngeal Swab; Respiratory  Result Value Ref Range Status   Adenovirus NOT DETECTED NOT DETECTED Final   Coronavirus 229E NOT DETECTED NOT DETECTED Final    Comment: (NOTE) The  Coronavirus on the Respiratory Panel, DOES NOT test for the novel  Coronavirus (2019 nCoV)    Coronavirus HKU1 NOT DETECTED NOT DETECTED Final   Coronavirus NL63 NOT DETECTED NOT DETECTED Final   Coronavirus OC43 NOT DETECTED NOT DETECTED Final   Metapneumovirus NOT DETECTED NOT DETECTED Final   Rhinovirus / Enterovirus NOT DETECTED NOT DETECTED Final   Influenza A NOT DETECTED NOT DETECTED Final   Influenza B NOT DETECTED NOT DETECTED Final   Parainfluenza Virus 1 NOT DETECTED NOT DETECTED Final   Parainfluenza Virus 2 NOT DETECTED NOT DETECTED Final   Parainfluenza Virus 3 NOT DETECTED NOT DETECTED Final   Parainfluenza Virus 4 NOT DETECTED NOT DETECTED Final   Respiratory Syncytial Virus NOT DETECTED NOT DETECTED Final   Bordetella pertussis NOT DETECTED NOT DETECTED Final   Bordetella Parapertussis NOT DETECTED NOT DETECTED Final   Chlamydophila pneumoniae NOT DETECTED NOT DETECTED Final   Mycoplasma pneumoniae NOT DETECTED NOT DETECTED Final    Comment: Performed at [facility], [address], [place], [place] [address]     Radiology Studies: DG ESOPHAGUS W SINGLE CM (SOL OR THIN BA)  Result Date: 05/11/2022 CLINICAL DATA:  Dysphasia.  Concern for aspiration. EXAM: ESOPHOGRAM/BARIUM SWALLOW TECHNIQUE: Single contrast examination was performed using  thin barium. FLUOROSCOPY: Radiation Exposure Index (as provided by the fluoroscopic device): 3.1 mGy mGy Kerma COMPARISON:  Modified barium swallow from previous day FINDINGS: The oral and oropharyngeal function was normal on the oropharyngeal motility study from the prior day. The patient took sequential sips thin barium in the upright LPO orientation. No signs of aspiration, esophageal stricture or mass. The esophagus empties normally. Mild esophageal dysmotility noted with diminished primary peristalsis. IMPRESSION: 1. No evidence for aspiration, esophageal stricture or mass. Normal emptying of the esophagus in the upright  orientation. 2. Mild esophageal dysmotility with diminished primary peristalsis. Electronically Signed   By: Signa Kell M.D.   On: 05/11/2022 13:09  Scheduled Meds:  amoxicillin-clavulanate  1 tablet Oral Q12H   atorvastatin  20 mg Oral QHS   enoxaparin (LOVENOX) injection  40 mg Subcutaneous Q24H   escitalopram  20 mg Oral q morning   feeding supplement  237 mL Oral TID BM   galantamine  16 mg Oral Q breakfast   guaiFENesin  1,200 mg Oral BID   memantine  28 mg Oral Daily   pantoprazole  40 mg Oral Daily   pneumococcal 20-valent conjugate vaccine  0.5 mL Intramuscular Tomorrow-1000   sodium chloride HYPERTONIC  4 mL Nebulization BID   Continuous Infusions:  Time spent: 55 minutes.   LOS: 4 days   Barnetta Chapel, MD [facility] P4/26/2024, 10:29 AM

## 2022-05-13 NOTE — Progress Notes (Addendum)
Physical Therapy Treatment Patient Details Name: [name] [name] MRN: [id number] DOB: 17-Sep-1930 Today's Date: 05/13/2022   History of Present Illness 89 y.o. female with medical history significant of hypertension, dementia, hyperlipidemia, breast cancer in remission, anxiety, COPD who was brought by her [facility] for follow-up of non resolving pneumonia. MBS showed significant esophageal dysphagia, so likely this is all from chronic aspiration    PT Comments    Pt ambulated 15' x 3 with RW, no loss of balance, distance limited by chest pain & fatigue. SpO2 83% on room air with ambulation, 92% on room air at rest. Pt not able to tolerate ambulating long enough to titrate O2. Pt has declined in activity tolerance. PT now recommending ST-SNF.     Recommendations for follow up therapy are one component of a multi-disciplinary discharge planning process, led by the attending physician.  Recommendations may be updated based on patient status, additional functional criteria and insurance authorization.  Follow Up Recommendations  Can patient physically be transported by private vehicle: Yes    Assistance Recommended at Discharge Intermittent Supervision/Assistance  Patient can return home with the following A little help with walking and/or transfers;A little help with bathing/dressing/bathroom;Assistance with cooking/housework;Direct supervision/assist for medications management;Assist for transportation;Help with stairs or ramp for entrance   Equipment Recommendations  Rolling walker (2 wheels)    Recommendations for Other Services       Precautions / Restrictions Precautions Precautions: Fall;Other (comment) Precaution Comments: monitor O2 sats Restrictions Weight Bearing Restrictions: No     Mobility  Bed Mobility Overal bed mobility: Modified Independent             General bed mobility comments: used bedrails    Transfers Overall transfer level: Needs  assistance Equipment used: Rolling walker (2 wheels) Transfers: Sit to/from Stand Sit to Stand: Supervision           General transfer comment: VCs for safe hand placement. Uncontrolled descent with stand to sit.    Ambulation/Gait Ambulation/Gait assistance: Supervision Gait Distance (Feet): 15 Feet Assistive device: Rolling walker (2 wheels) Gait Pattern/deviations: Step-through pattern, Decreased stride length Gait velocity: WFL     General Gait Details: 15' x 3 with seated rest break, SpO2 83% on room air with walking, distance limited by chest pain, 92% on room air at rest. VCs for pursed lip breathing. Pt not able to tolerate ambulation far enough to titrate O2.   Stairs             Wheelchair Mobility    Modified Rankin (Stroke Patients Only)       Balance Overall balance assessment: Modified Independent                                          Cognition Arousal/Alertness: Awake/alert Behavior During Therapy: WFL for tasks assessed/performed Overall Cognitive Status: No family/caregiver present to determine baseline cognitive functioning                                 General Comments: pt pleasant, able to follow commands        Exercises  Ankle pumps both AROM x10 seated Long arc quads both AROM x 10 seated Marching both AROM x10 seated Shoulder flexion AROM x 10 both seated    General Comments  Pertinent Vitals/Pain Pain Assessment Faces Pain Scale: Hurts even more Pain Location: chest with walking Pain Descriptors / Indicators: Grimacing Pain Intervention(s): Limited activity within patient's tolerance, Monitored during session, Repositioned    Home Living                          Prior Function            PT Goals (current goals can now be found in the care plan section) Acute Rehab PT Goals PT Goal Formulation: With family Time For Goal Achievement: 05/25/22 Potential to  Achieve Goals: Good Progress towards PT goals: Progressing toward goals    Frequency    Min 1X/week      PT Plan Discharge plan needs to be updated    Co-evaluation              AM-PAC PT "6 Clicks" Mobility   Outcome Measure  Help needed turning from your back to your side while in a flat bed without using bedrails?: A Little Help needed moving from lying on your back to sitting on the side of a flat bed without using bedrails?: A Little Help needed moving to and from a bed to a chair (including a wheelchair)?: A Little Help needed standing up from a chair using your arms (e.g., wheelchair or bedside chair)?: A Little Help needed to walk in hospital room?: A Little Help needed climbing 3-5 steps with a railing? : A Little 6 Click Score: 18    End of Session Equipment Utilized During Treatment: Gait belt;Oxygen Activity Tolerance: Patient limited by fatigue Patient left: in chair;with chair alarm set;with call bell/phone within reach;with family/visitor present Nurse Communication: Mobility status PT Visit Diagnosis: Difficulty in walking, not elsewhere classified (R26.2)     Time: 1610-9604 PT Time Calculation (min) (ACUTE ONLY): 25 min  Charges:  $Gait Training: 8-22 mins $Therapeutic Exercise: 8-22 mins                     Ralene Bathe Kistler PT 05/13/2022  Acute Rehabilitation Services  Office [phone]

## 2022-05-13 NOTE — Plan of Care (Signed)

## 2022-05-13 NOTE — [place] FL2 (Signed)
[place] MEDICAID FL2 LEVEL OF CARE FORM     IDENTIFICATION  Patient Name: [name] [name] Birthdate: February 21, 1930 Sex: female Admission Date (Current Location): 05/09/2022  [facility] and [place] Number:  Producer, television/film/video and Address:  [facility],  [address], [place] [address]      Provider Number: [id number]  Attending Physician Name and Address:  Barnetta Chapel, MD  Relative Name and Phone Number:  Jeanice Lim (niece) [phone]    Current Level of Care: Hospital Recommended Level of Care: Skilled Nursing Facility Prior Approval Number:    Date Approved/Denied:   PASRR Number: [id number]  Discharge Plan: SNF    Current Diagnoses: Patient Active Problem List   Diagnosis Date Noted   PNA (pneumonia) 05/09/2022   Breast cancer (HCC) 05/09/2022   S/P shoulder replacement 07/31/2015   COPD (chronic obstructive pulmonary disease) (HCC) 09/26/2013   Depression 09/20/2013   Unspecified constipation 09/20/2013   Expected blood loss anemia 09/18/2013   S/P left TKA 09/17/2013   Bradycardia    PVC's (premature ventricular contractions)    Hypertension    DEGENERATIVE JOINT DISEASE, KNEE 09/01/2009   ROTATOR CUFF SYNDROME, LEFT 09/01/2009   GENU VALGUM 09/01/2009   OTHER ACQUIRED DEFORMITY OF ANKLE AND FOOT OTHER 09/01/2009   UNEQUAL LEG LENGTH 09/01/2009    Orientation RESPIRATION BLADDER Height & Weight     Self, Time, Situation  Normal Continent Weight: 61.7 kg Height:  5\' 4"  (162.6 cm)  BEHAVIORAL SYMPTOMS/MOOD NEUROLOGICAL BOWEL NUTRITION STATUS      Continent Diet (Heart Diet)  AMBULATORY STATUS COMMUNICATION OF NEEDS Skin   Extensive Assist Verbally (History of dementia: Mostly remains alert and oriented) Other (Comment) (BLE redness/erythema, redness buttocks)                       Personal Care Assistance Level of Assistance  Bathing, Feeding, Dressing Bathing Assistance: Limited assistance Feeding assistance: Limited  assistance Dressing Assistance: Limited assistance     Functional Limitations Info  Sight, Hearing, Speech Sight Info: Impaired (glasses) Hearing Info: Impaired (left ear) Speech Info: Adequate    SPECIAL CARE FACTORS FREQUENCY  PT (By licensed PT), OT (By licensed OT)     PT Frequency: 5x/wk OT Frequency: 5/wk            Contractures Contractures Info: Not present    Additional Factors Info  Code Status, Allergies, Psychotropic Code Status Info: DNR Allergies Info: Albuterol, Diltiazem, Naproxen, Other, Cardizem (Diltiazem Hcl), Nitrofurantoin Psychotropic Info: Namenda 28mg  po daily, Galantamine 16mg  po daily         Current Medications (05/13/2022):  This is the current hospital active medication list Current Facility-Administered Medications  Medication Dose Route Frequency Provider Last Rate Last Admin   amoxicillin-clavulanate (AUGMENTIN) 875-125 MG per tablet 1 tablet  1 tablet Oral Q12H Burnadette Pop, MD   1 tablet at 05/13/22 0924   atorvastatin (LIPITOR) tablet 20 mg  20 mg Oral QHS Adhikari, Amrit, MD   20 mg at 05/12/22 2149   diphenoxylate-atropine (LOMOTIL) 2.5-0.025 MG per tablet 1 tablet  1 tablet Oral QID PRN Burnadette Pop, MD       enoxaparin (LOVENOX) injection 40 mg  40 mg Subcutaneous Q24H Adhikari, Amrit, MD   40 mg at 05/12/22 2149   escitalopram (LEXAPRO) tablet 20 mg  20 mg Oral q morning Burnadette Pop, MD   20 mg at 05/13/22 0925   feeding supplement (ENSURE ENLIVE / ENSURE PLUS) liquid 237 mL  237 mL Oral TID BM Ollis, Brandi L, NP   237 mL at 05/12/22 0927   galantamine (RAZADYNE ER) 24 hr capsule 16 mg  16 mg Oral Q breakfast Burnadette Pop, MD   16 mg at 05/13/22 0757   guaiFENesin (MUCINEX) 12 hr tablet 1,200 mg  1,200 mg Oral BID Veleta Miners, Brandi L, NP   1,200 mg at 05/13/22 1610   hydrALAZINE (APRESOLINE) injection 10 mg  10 mg Intravenous Q6H PRN Burnadette Pop, MD   10 mg at 05/12/22 1346   ipratropium (ATROVENT) nebulizer solution  0.5 mg  0.5 mg Nebulization Q6H PRN Burnadette Pop, MD       memantine (NAMENDA XR) 24 hr capsule 28 mg  28 mg Oral Daily Adhikari, Amrit, MD   28 mg at 05/13/22 9604   Oral care mouth rinse  15 mL Mouth Rinse PRN Burnadette Pop, MD       pantoprazole (PROTONIX) EC tablet 40 mg  40 mg Oral Daily Burnadette Pop, MD   40 mg at 05/13/22 5409   pneumococcal 20-valent conjugate vaccine (PREVNAR 20) injection 0.5 mL  0.5 mL Intramuscular Tomorrow-1000 Adhikari, Amrit, MD       polyethylene glycol (MIRALAX / GLYCOLAX) packet 17 g  17 g Oral Daily PRN Adhikari, Amrit, MD       sodium chloride HYPERTONIC 3 % nebulizer solution 4 mL  4 mL Nebulization BID Ollis, Brandi L, NP   4 mL at 05/13/22 8119     Discharge Medications: Please see discharge summary for a list of discharge medications.  Relevant Imaging Results:  Relevant Lab Results:   Additional Information    Howell Rucks, RN

## 2022-05-13 NOTE — TOC Progression Note (Addendum)
Transition of Care [facility]) - Progression Note    Patient Details  Name: DESSIRAE SCAROLA MRN: [id number] Date of Birth: [birth year]  Transition of Care [facility] CM/SW Contact  Howell Rucks, RN Phone Number: 05/13/2022, 1:18 PM  Clinical Narrative:   PT re-eval recommendation short term SNF, pt resident at [facility], [place] (niece) and Miquel Dunn [phone]) at [facility], preference [facility] short term rehab. FL2 completed. [employer] Medicare auth to be initiated by Joanne Chars, NCM. Anticipate dc 24-48 hrs per attending. Will continue to monitor.  - 1:51pm Per Janell:  The reference number is [id number]. The number to call to check the status is [phone] opt 3. Use NPI number [id number]. [employer] member ID # is [id number]. ( for the weekend person) I'll check before I leave today.       Barriers to Discharge: Continued Medical Work up  Expected Discharge Plan and Services                                               Social Determinants of Health (SDOH) Interventions SDOH Screenings   Food Insecurity: No Food Insecurity (05/10/2022)  Housing: Low Risk  (05/10/2022)  Transportation Needs: No Transportation Needs (05/10/2022)  Utilities: Not At Risk (05/10/2022)  Tobacco Use: Medium Risk (05/10/2022)    Readmission Risk Interventions    05/11/2022    4:19 PM  Readmission Risk Prevention Plan  Post Dischage Appt Complete  Transportation Screening Complete

## 2022-05-13 NOTE — Progress Notes (Signed)
Patient notified RN that she felt like she was having pain in the middle of chest after walking to bathroom. MD notified. Orders placed. Will continue to monitor.

## 2022-05-14 DIAGNOSIS — J189 Pneumonia, unspecified organism: Secondary | ICD-10-CM | POA: Diagnosis not present

## 2022-05-14 NOTE — Progress Notes (Signed)
PROGRESS NOTE  [name] [name]  ZOX:[id number] DOB: 08/20/1930 DOA: 05/09/2022 PCP: Rodrigo Ran, MD   Brief Narrative: Patient  is a 89 y.o. female with medical history significant of hypertension, hyperlipidemia, breast cancer in remission, anxiety, COPD who was brought by her niece from friends home for follow-up of pneumonia.  Patient was diagnosed with pneumonia by her PCP about 3 to 4 weeks ago and was put on oral antibiotics.  Initially she was started on Augmentin which did not help.  Follow-up chest x-ray showed unimproved pneumonia and she was started on Levaquin.   Patient remained short of breath, weak mostly when she ambulates . Chest imaging showed multifocal pneumonia/ILD like picture.  Pulmonology consulted .  MBS showed significant esophageal dysphagia, so likely this is all from chronic aspiration.  PT recommended home health on discharge but family requesting for SNF.  PT evaluation requested.  She might qualify for  home oxygen  05/13/2022: Patient was said to have reported chest pain earlier (according to the patient's nurse).  Stat EKG and troponins ordered.  Patient seen.  Patient denies having any current chest pain.  Will follow EKG and troponins.  Patient seems quite comfortable at the moment.  Will continue antibiotics.  Patient is currently on Augmentin.  05/14/2022: No further chest pain reported.  Troponin was negative.  Patient is awaiting disposition.  Patient is stable for discharge.  Assessment & Plan:  Principal Problem:   PNA (pneumonia) Active Problems:   Hypertension   Depression   COPD (chronic obstructive pulmonary disease) (HCC)   Breast cancer (HCC)  Multifocal pneumonia: Patient was recently diagnosed with community-acquired pneumonia by her PCP.  Treated with azithromycin without benefit so antibiotics changed to Levaquin.  No fever or chills.  No leukocytosis. Lactate level normal.  Negative procalcitonin. Negative COVID screening.  Respiratory viral  panel pending Patient was started on broad-spectrum antibiotics vancomycin and cefepime. Cultures now growth till date.   Chest imaging showed features of ILD, multifocal bilateral consolidations.  No pulmonary embolism. MBS showed significant esophageal dysphagia, DG esophagram negative. Pulmonary team thinks that this is most likely from chronic aspiration.  Autoimmune panel sent most of them are negative except for ANA, rheumatoid factor, ESR and CRP.  She will be followed by pulmonology as an outpatient. Needs to have small volume meals at a time, sitting upright position while eating and sitting for at least 1 hour after meals. Antibiotic changed to Augmentin today 05/14/2022: Complete course of antibiotics (Augmentin).       Acute hypoxic aspiratory failure: Patient desaturates on minimal ambulation.  Will check if she qualifies for home oxygen 05/14/2022: Resolved.  Hypertension: Bp soft.  Antihypertensives on hold 05/13/2022: Stable.    History of COPD: Continue bronchodilators as needed.  Does not use supplemental oxygen at home.  Past smoker   Dysphagia: Plan as above.  Speech therapy recommending solid diet, thin liquid   Hyperlipidemia: Takes Lipitor   History of depression: On Lexapro.  History of dementia: Mostly remains alert and oriented.  On galantamine, memantine   History of left breast cancer: Currently in remission   Elevated BNP: Echo showed EF of 50 to 55%, grade 1 diastolic dysfunction.  Currently appears euvolemic.   History of GERD: Continue Protonix  Reported chest pain: -Resolved. -Negative workup.  Goals of care: Elderly female with multifocal pneumonia.  CODE STATUS is DNR.  Confirmed with the niece.   Disposition: PT recommending home health but patient's family are very concerned.  She  is demented, lives alone and now mild to wear oxygen on discharge.  They are requesting for SNF.  PT reevaluation requested.  TOC notified          Pressure  Injury 05/10/22 Sacrum Stage 1 -  Intact skin with non-blanchable redness of a localized area usually over a bony prominence. (Active)  05/10/22 1500  Location: Sacrum  Location Orientation:   Staging: Stage 1 -  Intact skin with non-blanchable redness of a localized area usually over a bony prominence.  Wound Description (Comments):   Present on Admission:   Dressing Type Foam - Lift dressing to assess site every shift 05/13/22 2301    DVT prophylaxis:enoxaparin (LOVENOX) injection 40 mg Start: 05/09/22 2200     Code Status: DNR  Family Communication:  Patient status:Inpatient  Patient is from :Home  Anticipated discharge to:SNF  Estimated DC date: Pending SNF availability   Consultants: PCCM  Procedures: MBS  Antimicrobials:  Anti-infectives (From admission, onward)    Start     Dose/Rate Route Frequency Ordered Stop   05/12/22 1400  amoxicillin-clavulanate (AUGMENTIN) 875-125 MG per tablet 1 tablet        1 tablet Oral Every 12 hours 05/12/22 1156 05/17/22 0959   05/10/22 2200  vancomycin (VANCOREADY) IVPB 750 mg/150 mL  Status:  Discontinued        750 mg 150 mL/hr over 60 Minutes Intravenous Every 24 hours 05/09/22 1736 05/10/22 0948   05/09/22 2200  ceFEPIme (MAXIPIME) 2 g in sodium chloride 0.9 % 100 mL IVPB  Status:  Discontinued        2 g 200 mL/hr over 30 Minutes Intravenous Every 12 hours 05/09/22 1736 05/12/22 1156   05/09/22 1745  vancomycin (VANCOCIN) IVPB 1000 mg/200 mL premix        1,000 mg 200 mL/hr over 60 Minutes Intravenous  Once 05/09/22 1736 05/09/22 2306   05/09/22 1515  cefTRIAXone (ROCEPHIN) 1 g in sodium chloride 0.9 % 100 mL IVPB  Status:  Discontinued        1 g 200 mL/hr over 30 Minutes Intravenous Every 24 hours 05/09/22 1506 05/09/22 1728       Subjective: Patient seen alongside patient's niece. No new complaints.    Objective: Vitals:   05/13/22 2136 05/14/22 0404 05/14/22 0857 05/14/22 1441  BP: 112/64 129/70 106/64 115/80   Pulse: 77 73 85 87  Resp: 17 18 19 18   Temp: 97.8 F (36.6 C) (!) 97.4 F (36.3 C) 97.6 F (36.4 C)   TempSrc: Oral Oral Oral   SpO2: 97% 97% 99% 97%  Weight:      Height:        Intake/Output Summary (Last 24 hours) at 05/14/2022 1658 Last data filed at 05/14/2022 1100 Gross per 24 hour  Intake 1182 ml  Output 751 ml  Net 431 ml    Filed Weights   05/09/22 1129  Weight: 61.7 kg    Examination:  General exam: Overall comfortable, not in distress, pleasant elderly female HEENT: PERRL Respiratory system:  no wheezes or crackles ,scattered  coarse breath sounds Cardiovascular system: S1 & S2 heard, RRR.  Gastrointestinal system: Abdomen is nondistended, soft and nontender. Central nervous system: Awake and alert.  Patient moves all limbs.   Extremities: No edema.     Data Reviewed: I have personally reviewed following labs and imaging studies  CBC: Recent Labs  Lab 05/09/22 1525 05/10/22 0452 05/13/22 0414  WBC 10.1 7.9 6.3  NEUTROABS 7.9*  --   --  HGB 12.8 10.8* 10.8*  HCT 39.8 32.9* 33.5*  MCV 99.0 99.1 98.8  PLT 268 201 198    Basic Metabolic Panel: Recent Labs  Lab 05/09/22 1205 05/10/22 0452 05/13/22 0414  NA 131* 132* 134*  K 3.6 3.6 3.8  CL 96* 99 100  CO2 26 24 27   GLUCOSE 94 93 96  BUN 15 15 11   CREATININE 0.80 0.78 0.74  CALCIUM 8.4* 8.2* 8.3*      Recent Results (from the past 240 hour(s))  Blood culture (routine x 2)     Status: None   Collection Time: 05/09/22  3:25 PM   Specimen: Left Antecubital; Blood  Result Value Ref Range Status   Specimen Description   Final    LEFT ANTECUBITAL BLOOD Performed at [facility], [address] [place], [place] [address]    Special Requests   Final    BOTTLES DRAWN AEROBIC AND ANAEROBIC Blood Culture results may not be optimal due to an excessive volume of blood received in culture bottles Performed at [facility], [address], [place], [place] [address]     Culture   Final    NO GROWTH 5 DAYS Performed at [facility], [address], [place], [place] [address]    Report Status 05/14/2022 FINAL  Final  Blood culture (routine x 2)     Status: None   Collection Time: 05/09/22  3:25 PM   Specimen: BLOOD  Result Value Ref Range Status   Specimen Description   Final    BLOOD SPECIMEN SOURCE NOT MARKED ON REQUISITION Performed at [facility], [address], [place], [place] [address]    Special Requests   Final    BOTTLES DRAWN AEROBIC AND ANAEROBIC Blood Culture results may not be optimal due to an excessive volume of blood received in culture bottles Performed at [facility], [address], [place], [place] [address]    Culture   Final    NO GROWTH 5 DAYS Performed at [facility], [address], [place], [place] [address]    Report Status 05/14/2022 FINAL  Final  SARS Coronavirus 2 by RT PCR (hospital order, performed in [facility] hospital lab) *cepheid single result test* Anterior Nasal Swab     Status: None   Collection Time: 05/10/22  2:01 AM   Specimen: Anterior Nasal Swab  Result Value Ref Range Status   SARS Coronavirus 2 by RT PCR NEGATIVE NEGATIVE Final    Comment: (NOTE) SARS-CoV-2 target nucleic acids are NOT DETECTED.  The SARS-CoV-2 RNA is generally detectable in upper and lower respiratory specimens during the acute phase of infection. The lowest concentration of SARS-CoV-2 viral copies this assay can detect is 250 copies / mL. A negative result does not preclude SARS-CoV-2 infection and should not be used as the sole basis for treatment or other patient management decisions.  A negative result may occur with improper specimen collection / handling, submission of specimen other than nasopharyngeal swab, presence of viral mutation(s) within the areas targeted by this assay, and inadequate number of viral copies (<250 copies / mL). A negative result must be  combined with clinical observations, patient history, and epidemiological information.  Fact Sheet for Patients:   RoadLapTop.co.za  Fact Sheet for Healthcare Providers: http://kim-miller.com/  This test is not yet approved or  cleared by the [place] FDA and has been authorized for detection and/or diagnosis of SARS-CoV-2 by FDA under  an Emergency Use Authorization (EUA).  This EUA will remain in effect (meaning this test can be used) for the duration of the COVID-19 declaration under Section 564(b)(1) of the Act, 21 U.S.C. section 360bbb-3(b)(1), unless the authorization is terminated or revoked sooner.  Performed at [facility], [address], [place], [place] [address]   MRSA Next Gen by PCR, Nasal     Status: None   Collection Time: 05/10/22  2:01 AM   Specimen: Nasal Mucosa; Nasal Swab  Result Value Ref Range Status   MRSA by PCR Next Gen NOT DETECTED NOT DETECTED Final    Comment: (NOTE) The GeneXpert MRSA Assay (FDA approved for NASAL specimens only), is one component of a comprehensive MRSA colonization surveillance program. It is not intended to diagnose MRSA infection nor to guide or monitor treatment for MRSA infections. Test performance is not FDA approved in patients less than 9 years old. Performed at [facility], [address] [place], [place] [address]   Respiratory (~20 pathogens) panel by PCR     Status: None   Collection Time: 05/10/22 10:26 AM   Specimen: Nasopharyngeal Swab; Respiratory  Result Value Ref Range Status   Adenovirus NOT DETECTED NOT DETECTED Final   Coronavirus 229E NOT DETECTED NOT DETECTED Final    Comment: (NOTE) The Coronavirus on the Respiratory Panel, DOES NOT test for the novel  Coronavirus (2019 nCoV)    Coronavirus HKU1 NOT DETECTED NOT DETECTED Final   Coronavirus NL63 NOT DETECTED NOT DETECTED Final   Coronavirus OC43 NOT DETECTED  NOT DETECTED Final   Metapneumovirus NOT DETECTED NOT DETECTED Final   Rhinovirus / Enterovirus NOT DETECTED NOT DETECTED Final   Influenza A NOT DETECTED NOT DETECTED Final   Influenza B NOT DETECTED NOT DETECTED Final   Parainfluenza Virus 1 NOT DETECTED NOT DETECTED Final   Parainfluenza Virus 2 NOT DETECTED NOT DETECTED Final   Parainfluenza Virus 3 NOT DETECTED NOT DETECTED Final   Parainfluenza Virus 4 NOT DETECTED NOT DETECTED Final   Respiratory Syncytial Virus NOT DETECTED NOT DETECTED Final   Bordetella pertussis NOT DETECTED NOT DETECTED Final   Bordetella Parapertussis NOT DETECTED NOT DETECTED Final   Chlamydophila pneumoniae NOT DETECTED NOT DETECTED Final   Mycoplasma pneumoniae NOT DETECTED NOT DETECTED Final    Comment: Performed at [facility], [address] [place], [place] [address]     Radiology Studies: No results found.  Scheduled Meds:  amoxicillin-clavulanate  1 tablet Oral Q12H   atorvastatin  20 mg Oral QHS   enoxaparin (LOVENOX) injection  40 mg Subcutaneous Q24H   escitalopram  20 mg Oral q morning   feeding supplement  237 mL Oral TID BM   galantamine  16 mg Oral Q breakfast   guaiFENesin  1,200 mg Oral BID   memantine  28 mg Oral Daily   pantoprazole  40 mg Oral Daily   pneumococcal 20-valent conjugate vaccine  0.5 mL Intramuscular Tomorrow-1000   Continuous Infusions:  Time spent: 35 minutes.   LOS: 5 days   Barnetta Chapel, MD [facility] P4/27/2024, 4:58 PM

## 2022-05-15 DIAGNOSIS — E871 Hypo-osmolality and hyponatremia: Secondary | ICD-10-CM

## 2022-05-15 DIAGNOSIS — J189 Pneumonia, unspecified organism: Secondary | ICD-10-CM | POA: Diagnosis not present

## 2022-05-15 LAB — RENAL FUNCTION PANEL
Albumin: 2.6 g/dL — ABNORMAL LOW (ref 3.5–5.0)
Anion gap: 11 (ref 5–15)
BUN: 12 mg/dL (ref 8–23)
CO2: 23 mmol/L (ref 22–32)
Calcium: 8.7 mg/dL — ABNORMAL LOW (ref 8.9–10.3)
Chloride: 98 mmol/L (ref 98–111)
Creatinine, Ser: 0.78 mg/dL (ref 0.44–1.00)
GFR, Estimated: >60 mL/min (ref >60)
Glucose, Bld: 101 mg/dL — ABNORMAL HIGH (ref 70–99)
Phosphorus: 2.8 mg/dL (ref 2.5–4.6)
Potassium: 4.3 mmol/L (ref 3.5–5.1)
Sodium: 132 mmol/L — ABNORMAL LOW (ref 135–145)

## 2022-05-15 LAB — CBC WITH DIFFERENTIAL/PLATELET
Abs Immature Granulocytes: 0.05 10*3/uL (ref 0.00–0.07)
Basophils Absolute: 0.1 10*3/uL (ref 0.0–0.1)
Basophils Relative: 1 %
Eosinophils Absolute: 0.7 10*3/uL — ABNORMAL HIGH (ref 0.0–0.5)
Eosinophils Relative: 8 %
HCT: 34.4 % — ABNORMAL LOW (ref 36.0–46.0)
Hemoglobin: 11.2 g/dL — ABNORMAL LOW (ref 12.0–15.0)
Immature Granulocytes: 1 %
Lymphocytes Relative: 21 %
Lymphs Abs: 1.9 10*3/uL (ref 0.7–4.0)
MCH: 31.7 pg (ref 26.0–34.0)
MCHC: 32.6 g/dL (ref 30.0–36.0)
MCV: 97.5 fL (ref 80.0–100.0)
Monocytes Absolute: 0.8 10*3/uL (ref 0.1–1.0)
Monocytes Relative: 9 %
Neutro Abs: 5.3 10*3/uL (ref 1.7–7.7)
Neutrophils Relative %: 60 %
Platelets: 263 10*3/uL (ref 150–400)
RBC: 3.53 MIL/uL — ABNORMAL LOW (ref 3.87–5.11)
RDW: 13.8 % (ref 11.5–15.5)
WBC: 8.8 10*3/uL (ref 4.0–10.5)
nRBC: 0 % (ref 0.0–0.2)

## 2022-05-15 MED ORDER — METOPROLOL TARTRATE 25 MG PO TABS
12.5000 mg | ORAL_TABLET | Freq: Two times a day (BID) | ORAL | Status: DC
  Administered 2022-05-15 – 2022-05-16 (×3): 12.5 mg via ORAL
  Filled 2022-05-15 (×3): qty 1

## 2022-05-15 NOTE — Progress Notes (Signed)
Mobility Specialist - Progress Note   05/15/22 1100  Mobility  Activity Ambulated with assistance to bathroom  Level of Assistance Standby assist, set-up cues, supervision of patient - no hands on  Assistive Device Front wheel walker  Distance Ambulated (ft) 20 ft  Activity Response Tolerated well  Mobility Referral Yes  $Mobility charge 1 Mobility   Pt received in bed requesting assistance to bathroom for BM. When returning to recliner pt c/o feeling SOB. O2 at 91% & encouraged pursed lip breaths. No other complaints during session. Pt to recliner after with all needs met.   During mobility: 91% SpO2   [employer]

## 2022-05-15 NOTE — Progress Notes (Signed)
Mobility Specialist - Progress Note   05/15/22 1449  Mobility  Activity Ambulated with assistance in hallway;Ambulated with assistance to bathroom  Level of Assistance Standby assist, set-up cues, supervision of patient - no hands on  Assistive Device Front wheel walker  Distance Ambulated (ft) 25 ft  Activity Response Tolerated well  Mobility Referral Yes  $Mobility charge 1 Mobility   Pt received in bed and agreeable to mobility. Prior to ambulating, pt requested to use the bathroom for a BM. Once in hallway pt stated "I don't feel like walking anymore". Encouraged a few more steps. No complaints during session. Pt to bed after session with all needs met.   [facility]

## 2022-05-15 NOTE — Progress Notes (Signed)
Mobility Specialist - Progress Note   05/15/22 1125  Mobility  Activity Ambulated with assistance to bathroom  Level of Assistance Standby assist, set-up cues, supervision of patient - no hands on  Assistive Device Front wheel walker  Distance Ambulated (ft) 20 ft  Activity Response Tolerated well  Mobility Referral Yes  $Mobility charge 1 Mobility   Pt received in bathroom for second BM requesting assistance back to recliner. No complaints during session. Pt to recliner after session with all needs met.     [facility]

## 2022-05-15 NOTE — Progress Notes (Signed)
PROGRESS NOTE  [name] [name]  AOZ:[id number] DOB: September 27, 1930 DOA: 05/09/2022 PCP: Rodrigo Ran, MD   Brief Narrative: Patient  is a 89 y.o. female with medical history significant of hypertension, hyperlipidemia, breast cancer in remission, anxiety, COPD who was brought by her niece from friends home for follow-up of pneumonia.  Patient was diagnosed with pneumonia by her PCP about 3 to 4 weeks ago and was put on oral antibiotics.  Initially she was started on Augmentin which did not help.  Follow-up chest x-ray showed unimproved pneumonia and she was started on Levaquin.   Patient remained short of breath, weak mostly when she ambulates. Chest imaging showed multifocal pneumonia/ILD like picture.  Pulmonology consulted .  MBS showed significant esophageal dysphagia, so likely this is all from chronic aspiration.    Assessment & Plan:   Multifocal pneumonia/concern for aspiration pneumonia Patient was recently diagnosed with community-acquired pneumonia by her PCP.  Treated with azithromycin without benefit so antibiotics changed to Levaquin.  No fever or chills.  No leukocytosis. Lactate level normal.  Negative procalcitonin. Negative COVID screening.  Respiratory viral panel unremarkable Patient was started on broad-spectrum antibiotics vancomycin and cefepime. Cultures now growth till date.   Chest imaging showed features of ILD, multifocal bilateral consolidations.  No pulmonary embolism. MBS showed significant esophageal dysphagia, DG esophagram negative. Pulmonary team thinks that this is most likely from chronic aspiration.  Autoimmune panel sent most of them are negative except for ANA, rheumatoid factor, ESR and CRP.  She will be followed by pulmonology as an outpatient. Seen by speech therapy.  On regular diet with thin liquids.  Needs to be sitting in an upright position while eating. Antibiotics changed over to Augmentin.    Acute hypoxic aspiratory failure Patient desaturated into  the 80s with ambulation with physical therapy.  Will benefit from oxygen at discharge.    Essential hypertension Her antihypertensives were held due to soft blood pressure.  Telemetry shows evidence for occasional SVTs.  Will resume beta-blocker at a lower dose.    History of COPD Continue bronchodilators as needed.  Past smoker   Dysphagia Plan as above.  Speech therapy recommending solid diet, thin liquid   Hyperlipidemia Takes Lipitor   History of depression On Lexapro.  History of dementia Mostly remains alert and oriented.  Noted to be on galantamine and memantine as inpatient.   History of left breast cancer Currently in remission   Elevated BNP Echo showed EF of 50 to 55%, grade 1 diastolic dysfunction.  Currently appears euvolemic.   History of GERD Continue Protonix  Reported chest pain: -Resolved. -Negative workup.  Normocytic anemia No evidence of overt bleeding.  Hyponatremia Stable.  Goals of care: Elderly female with multifocal pneumonia.  CODE STATUS is DNR.  Confirmed with the niece.   Disposition: PT recommending home health but patient's family are very concerned.  She is demented, lives alone and now mild to wear oxygen on discharge.  They are requesting for SNF.  PT reevaluation requested.  TOC notified  DVT prophylaxis:enoxaparin (LOVENOX) injection 40 mg Start: 05/09/22 2200 Code Status: DNR Family Communication: No family at bedside.   Consultants: PCCM  Procedures: MBS  Antimicrobials:  Anti-infectives (From admission, onward)    Start     Dose/Rate Route Frequency Ordered Stop   05/12/22 1400  amoxicillin-clavulanate (AUGMENTIN) 875-125 MG per tablet 1 tablet        1 tablet Oral Every 12 hours 05/12/22 1156 05/17/22 0959   05/10/22 2200  vancomycin (  VANCOREADY) IVPB 750 mg/150 mL  Status:  Discontinued        750 mg 150 mL/hr over 60 Minutes Intravenous Every 24 hours 05/09/22 1736 05/10/22 0948   05/09/22 2200  ceFEPIme  (MAXIPIME) 2 g in sodium chloride 0.9 % 100 mL IVPB  Status:  Discontinued        2 g 200 mL/hr over 30 Minutes Intravenous Every 12 hours 05/09/22 1736 05/12/22 1156   05/09/22 1745  vancomycin (VANCOCIN) IVPB 1000 mg/200 mL premix        1,000 mg 200 mL/hr over 60 Minutes Intravenous  Once 05/09/22 1736 05/09/22 2306   05/09/22 1515  cefTRIAXone (ROCEPHIN) 1 g in sodium chloride 0.9 % 100 mL IVPB  Status:  Discontinued        1 g 200 mL/hr over 30 Minutes Intravenous Every 24 hours 05/09/22 1506 05/09/22 1728       Subjective: Patient denies any complaints this morning.  Feels better.  Looking forward to going to rehab.  Objective: Vitals:   05/14/22 1441 05/14/22 2118 05/15/22 0445 05/15/22 0924  BP: 115/80 106/64 106/62 100/68  Pulse: 87 91 81 90  Resp: 18 (!) 23 18 16   Temp:  98 F (36.7 C) 98.5 F (36.9 C)   TempSrc:  Oral Oral   SpO2: 97% 95% 97% 96%  Weight:      Height:        Intake/Output Summary (Last 24 hours) at 05/15/2022 0949 Last data filed at 05/15/2022 0900 Gross per 24 hour  Intake 240 ml  Output 502 ml  Net -262 ml    Filed Weights   05/09/22 1129  Weight: 61.7 kg    Examination:  General appearance: Awake alert.  In no distress.  Mildly distracted. Resp: Few crackles at the bases.  No wheezing or rhonchi. Cardio: S1-S2 is normal regular.  No S3-S4.  No rubs murmurs or bruit GI: Abdomen is soft.  Nontender nondistended.  Bowel sounds are present normal.  No masses organomegaly    Data Reviewed: I have personally reviewed following labs and imaging studies  CBC: Recent Labs  Lab 05/09/22 1525 05/10/22 0452 05/13/22 0414 05/15/22 0414  WBC 10.1 7.9 6.3 8.8  NEUTROABS 7.9*  --   --  5.3  HGB 12.8 10.8* 10.8* 11.2*  HCT 39.8 32.9* 33.5* 34.4*  MCV 99.0 99.1 98.8 97.5  PLT 268 201 198 263    Basic Metabolic Panel: Recent Labs  Lab 05/09/22 1205 05/10/22 0452 05/13/22 0414 05/15/22 0414  NA 131* 132* 134* 132*  K 3.6 3.6 3.8  4.3  CL 96* 99 100 98  CO2 26 24 27 23   GLUCOSE 94 93 96 101*  BUN 15 15 11 12   CREATININE 0.80 0.78 0.74 0.78  CALCIUM 8.4* 8.2* 8.3* 8.7*  PHOS  --   --   --  2.8      Recent Results (from the past 240 hour(s))  Blood culture (routine x 2)     Status: None   Collection Time: 05/09/22  3:25 PM   Specimen: Left Antecubital; Blood  Result Value Ref Range Status   Specimen Description   Final    LEFT ANTECUBITAL BLOOD Performed at [facility], [address], [place], [place] [address]    Special Requests   Final    BOTTLES DRAWN AEROBIC AND ANAEROBIC Blood Culture results may not be optimal due to an excessive volume of blood received in culture bottles Performed at [facility], [address], [place], [place] [address]    Culture   Final    NO GROWTH 5 DAYS Performed at [facility], [address], [place], [place] [address]    Report Status 05/14/2022 FINAL  Final  Blood culture (routine x 2)     Status: None   Collection Time: 05/09/22  3:25 PM   Specimen: BLOOD  Result Value Ref Range Status   Specimen Description   Final    BLOOD SPECIMEN SOURCE NOT MARKED ON REQUISITION Performed at [facility], [address] [place], [place] [address]    Special Requests   Final    BOTTLES DRAWN AEROBIC AND ANAEROBIC Blood Culture results may not be optimal due to an excessive volume of blood received in culture bottles Performed at [facility], [address] [place], [place] [address]    Culture   Final    NO GROWTH 5 DAYS Performed at [facility], [address] [place], [place] [address]    Report Status 05/14/2022 FINAL  Final  SARS Coronavirus 2 by RT PCR (hospital order, performed in [facility] hospital lab) *cepheid single result test* Anterior Nasal Swab     Status: None   Collection Time: 05/10/22  2:01 AM   Specimen: Anterior Nasal Swab  Result Value Ref Range Status   SARS  Coronavirus 2 by RT PCR NEGATIVE NEGATIVE Final    Comment: (NOTE) SARS-CoV-2 target nucleic acids are NOT DETECTED.  The SARS-CoV-2 RNA is generally detectable in upper and lower respiratory specimens during the acute phase of infection. The lowest concentration of SARS-CoV-2 viral copies this assay can detect is 250 copies / mL. A negative result does not preclude SARS-CoV-2 infection and should not be used as the sole basis for treatment or other patient management decisions.  A negative result may occur with improper specimen collection / handling, submission of specimen other than nasopharyngeal swab, presence of viral mutation(s) within the areas targeted by this assay, and inadequate number of viral copies (<250 copies / mL). A negative result must be combined with clinical observations, patient history, and epidemiological information.  Fact Sheet for Patients:   RoadLapTop.co.za  Fact Sheet for Healthcare Providers: http://kim-miller.com/  This test is not yet approved or  cleared by the [place] FDA and has been authorized for detection and/or diagnosis of SARS-CoV-2 by FDA under an Emergency Use Authorization (EUA).  This EUA will remain in effect (meaning this test can be used) for the duration of the COVID-19 declaration under Section 564(b)(1) of the Act, 21 U.S.C. section 360bbb-3(b)(1), unless the authorization is terminated or revoked sooner.  Performed at [facility], [address] [place], [place] [address]   MRSA Next Gen by PCR, Nasal     Status: None   Collection Time: 05/10/22  2:01 AM   Specimen: Nasal Mucosa; Nasal Swab  Result Value Ref Range Status   MRSA by PCR Next Gen NOT DETECTED NOT DETECTED Final    Comment: (NOTE) The GeneXpert MRSA Assay (FDA approved for NASAL specimens only), is one component of a comprehensive MRSA colonization surveillance program. It is not intended  to diagnose MRSA infection nor to guide or monitor treatment for MRSA infections. Test performance is not FDA approved in patients less than 38 years old. Performed at [facility], [address], [place], [place] [address]   Respiratory (~20 pathogens) panel by PCR  Status: None   Collection Time: 05/10/22 10:26 AM   Specimen: Nasopharyngeal Swab; Respiratory  Result Value Ref Range Status   Adenovirus NOT DETECTED NOT DETECTED Final   Coronavirus 229E NOT DETECTED NOT DETECTED Final    Comment: (NOTE) The Coronavirus on the Respiratory Panel, DOES NOT test for the novel  Coronavirus (2019 nCoV)    Coronavirus HKU1 NOT DETECTED NOT DETECTED Final   Coronavirus NL63 NOT DETECTED NOT DETECTED Final   Coronavirus OC43 NOT DETECTED NOT DETECTED Final   Metapneumovirus NOT DETECTED NOT DETECTED Final   Rhinovirus / Enterovirus NOT DETECTED NOT DETECTED Final   Influenza A NOT DETECTED NOT DETECTED Final   Influenza B NOT DETECTED NOT DETECTED Final   Parainfluenza Virus 1 NOT DETECTED NOT DETECTED Final   Parainfluenza Virus 2 NOT DETECTED NOT DETECTED Final   Parainfluenza Virus 3 NOT DETECTED NOT DETECTED Final   Parainfluenza Virus 4 NOT DETECTED NOT DETECTED Final   Respiratory Syncytial Virus NOT DETECTED NOT DETECTED Final   Bordetella pertussis NOT DETECTED NOT DETECTED Final   Bordetella Parapertussis NOT DETECTED NOT DETECTED Final   Chlamydophila pneumoniae NOT DETECTED NOT DETECTED Final   Mycoplasma pneumoniae NOT DETECTED NOT DETECTED Final    Comment: Performed at [facility], [address] [place], [place] [address]     Radiology Studies: No results found.  Scheduled Meds:  amoxicillin-clavulanate  1 tablet Oral Q12H   atorvastatin  20 mg Oral QHS   enoxaparin (LOVENOX) injection  40 mg Subcutaneous Q24H   escitalopram  20 mg Oral q morning   feeding supplement  237 mL Oral TID BM   galantamine  16 mg Oral Q breakfast    guaiFENesin  1,200 mg Oral BID   memantine  28 mg Oral Daily   metoprolol tartrate  12.5 mg Oral BID   pantoprazole  40 mg Oral Daily   pneumococcal 20-valent conjugate vaccine  0.5 mL Intramuscular Tomorrow-1000   Continuous Infusions:     LOS: 6 days   Osvaldo Shipper, MD [facility] P4/28/2024, 9:49 AM

## 2022-05-16 ENCOUNTER — Encounter: Payer: Self-pay | Admitting: Adult Health

## 2022-05-16 ENCOUNTER — Non-Acute Institutional Stay (SKILLED_NURSING_FACILITY): Payer: Medicare HMO | Admitting: Adult Health

## 2022-05-16 DIAGNOSIS — R1312 Dysphagia, oropharyngeal phase: Secondary | ICD-10-CM | POA: Diagnosis not present

## 2022-05-16 DIAGNOSIS — Z87891 Personal history of nicotine dependence: Secondary | ICD-10-CM | POA: Diagnosis not present

## 2022-05-16 DIAGNOSIS — F32A Depression, unspecified: Secondary | ICD-10-CM | POA: Diagnosis not present

## 2022-05-16 DIAGNOSIS — E639 Nutritional deficiency, unspecified: Secondary | ICD-10-CM | POA: Insufficient documentation

## 2022-05-16 DIAGNOSIS — E538 Deficiency of other specified B group vitamins: Secondary | ICD-10-CM | POA: Insufficient documentation

## 2022-05-16 DIAGNOSIS — J440 Chronic obstructive pulmonary disease with acute lower respiratory infection: Secondary | ICD-10-CM | POA: Diagnosis not present

## 2022-05-16 DIAGNOSIS — J188 Other pneumonia, unspecified organism: Secondary | ICD-10-CM | POA: Diagnosis not present

## 2022-05-16 DIAGNOSIS — F419 Anxiety disorder, unspecified: Secondary | ICD-10-CM | POA: Diagnosis not present

## 2022-05-16 DIAGNOSIS — Z7401 Bed confinement status: Secondary | ICD-10-CM | POA: Diagnosis not present

## 2022-05-16 DIAGNOSIS — Z743 Need for continuous supervision: Secondary | ICD-10-CM | POA: Diagnosis not present

## 2022-05-16 DIAGNOSIS — I10 Essential (primary) hypertension: Secondary | ICD-10-CM

## 2022-05-16 DIAGNOSIS — J189 Pneumonia, unspecified organism: Secondary | ICD-10-CM | POA: Diagnosis not present

## 2022-05-16 DIAGNOSIS — K219 Gastro-esophageal reflux disease without esophagitis: Secondary | ICD-10-CM | POA: Diagnosis not present

## 2022-05-16 DIAGNOSIS — F339 Major depressive disorder, recurrent, unspecified: Secondary | ICD-10-CM

## 2022-05-16 DIAGNOSIS — K5901 Slow transit constipation: Secondary | ICD-10-CM | POA: Diagnosis not present

## 2022-05-16 DIAGNOSIS — R600 Localized edema: Secondary | ICD-10-CM | POA: Diagnosis not present

## 2022-05-16 DIAGNOSIS — I700 Atherosclerosis of aorta: Secondary | ICD-10-CM | POA: Diagnosis not present

## 2022-05-16 DIAGNOSIS — F0390 Unspecified dementia without behavioral disturbance: Secondary | ICD-10-CM | POA: Diagnosis not present

## 2022-05-16 DIAGNOSIS — J439 Emphysema, unspecified: Secondary | ICD-10-CM | POA: Diagnosis not present

## 2022-05-16 DIAGNOSIS — E871 Hypo-osmolality and hyponatremia: Secondary | ICD-10-CM | POA: Diagnosis not present

## 2022-05-16 DIAGNOSIS — R531 Weakness: Secondary | ICD-10-CM | POA: Diagnosis not present

## 2022-05-16 DIAGNOSIS — J181 Lobar pneumonia, unspecified organism: Secondary | ICD-10-CM | POA: Diagnosis present

## 2022-05-16 DIAGNOSIS — J479 Bronchiectasis, uncomplicated: Secondary | ICD-10-CM | POA: Diagnosis not present

## 2022-05-16 DIAGNOSIS — Z853 Personal history of malignant neoplasm of breast: Secondary | ICD-10-CM | POA: Insufficient documentation

## 2022-05-16 DIAGNOSIS — F03A4 Unspecified dementia, mild, with anxiety: Secondary | ICD-10-CM | POA: Diagnosis not present

## 2022-05-16 DIAGNOSIS — F02A4 Dementia in other diseases classified elsewhere, mild, with anxiety: Secondary | ICD-10-CM | POA: Diagnosis not present

## 2022-05-16 DIAGNOSIS — J849 Interstitial pulmonary disease, unspecified: Secondary | ICD-10-CM | POA: Diagnosis not present

## 2022-05-16 DIAGNOSIS — D519 Vitamin B12 deficiency anemia, unspecified: Secondary | ICD-10-CM | POA: Diagnosis not present

## 2022-05-16 DIAGNOSIS — J449 Chronic obstructive pulmonary disease, unspecified: Secondary | ICD-10-CM | POA: Diagnosis not present

## 2022-05-16 DIAGNOSIS — E785 Hyperlipidemia, unspecified: Secondary | ICD-10-CM | POA: Diagnosis not present

## 2022-05-16 DIAGNOSIS — F341 Dysthymic disorder: Secondary | ICD-10-CM | POA: Diagnosis not present

## 2022-05-16 MED ORDER — POLYETHYLENE GLYCOL 3350 17 G PO PACK: 17.0000 g | PACK | Freq: Every day | ORAL | 0 refills | Status: DC | PRN

## 2022-05-16 MED ORDER — PANTOPRAZOLE SODIUM 40 MG PO TBEC: 40.0000 mg | DELAYED_RELEASE_TABLET | Freq: Every day | ORAL | Status: DC

## 2022-05-16 MED ORDER — METOPROLOL TARTRATE 25 MG PO TABS: 12.5000 mg | ORAL_TABLET | Freq: Two times a day (BID) | ORAL | Status: DC

## 2022-05-16 MED ORDER — AMOXICILLIN-POT CLAVULANATE 875-125 MG PO TABS: 1.0000 | ORAL_TABLET | Freq: Two times a day (BID) | ORAL | 0 refills | Status: AC

## 2022-05-16 MED ORDER — ENSURE ENLIVE PO LIQD: 237.0000 mL | Freq: Three times a day (TID) | ORAL | 12 refills | Status: DC

## 2022-05-16 MED ORDER — VITAMIN B12 1000 MCG PO TBCR: 1000.0000 ug | EXTENDED_RELEASE_TABLET | Freq: Every day | ORAL | Status: DC

## 2022-05-16 MED ORDER — MEMANTINE HCL ER 28 MG PO CP24: 28.0000 mg | ORAL_CAPSULE | Freq: Every day | ORAL | Status: AC

## 2022-05-16 NOTE — Progress Notes (Signed)
The patient is stable. No changes from am assessment. Pt is alert, oriented x2 (self, place). This is not a known change from baseline per family. She is out of the bed, 1 assist with walker.

## 2022-05-16 NOTE — Progress Notes (Signed)
Location:    [facility]ursing Home Room Number: 34 Place of Service:  SNF (31) Provider:  Lenny Fiumara C.Grayce Sessions, NP   Patient Care Team: Rodrigo Ran, MD as PCP - General (Internal Medicine) Quintella Reichert, MD as PCP - Cardiology (Cardiology)  Extended Emergency Contact Information Primary Emergency Contact: Ruch,Holly Address: [address]          [place], [place] [address] [place] Phone: [phone] Relation: Niece Secondary Emergency Contact: Ruch,George Address: [address]          [place], [place] [address] [place] Home Phone: [phone] Mobile Phone: [phone] Relation: Nephew  Code Status:  DNR Goals of care: Advanced Directive information    05/16/2022    3:05 PM  Advanced Directives  Does Patient Have a Medical Advance Directive? No  Would patient like information on creating a medical advance directive? No - Patient declined     Chief Complaint  Patient presents with   Hospitalization Follow-up    Hospital follow-up since April 22,2024 at [facility].    HPI:  Pt is a 89 y.o. female seen today for a hospital f/u hospital admission 05/09/22 to 05/16/22 for pneumonia, aspiration versus community acquired. She has a PMH of hypertension, hyperlipidemia, breast cancer in remission, anxiety and COPD. She was initially treated with Augmentin for pneumonia by her outpatient PCP 3-4 weeks prior to hospitalization. Follow up chest x-ray showed no pneumonia  improvement so she was started on Levaquin. She remained short of breath and weak.  Chest x-ray showed multifocal pneumonia/ILD like picture. Pulmonology was consulted. MBS showed significant esophageal dysphagea likely from chronic aspiration. In the hospital, she was treated with Vancomycin and Cefepime. On discharge antibiotics was then transitioned to Augmentin.   She was admitted to [facility] on 05/16/22.   Past Medical History:  Diagnosis Date    Anxiety    Bradycardia    asymptomatic   Breast CA (HCC) 1993 OR 1994   LEFT, SURGERY AND RADIATION DONE   COPD (chronic obstructive pulmonary disease) (HCC)    MILD, NO INHALERS USED   DEGENERATIVE JOINT DISEASE, KNEE    Family history of anesthesia complication    NEPHEW NAUSEA/VOMITING   GENU VALGUM    Hyperlipidemia    Hypertension    Numbness of leg 2011   just left shin   OTHER ACQUIRED DEFORMITY OF ANKLE AND FOOT OTHER    Pneumonia 2013   X 2   PVC's (premature ventricular contractions)    ROTATOR CUFF SYNDROME, LEFT    CANNOT LIFT LEFT ARM ALL THE WAY UP   UNEQUAL LEG LENGTH    UTI (urinary tract infection)    STARTED AUGMENTIN  ON 09-10-13   Past Surgical History:  Procedure Laterality Date   APPENDECTOMY  AGE 30   BREAST LUMPECTOMY Left 1991   radiation   BREAST SURGERY Left 1993 OR 1994   LUMPECTOMY AND RADIATION DONE   JOINT REPLACEMENT     NASAL SINUS SURGERY  2006   REVERSE SHOULDER ARTHROPLASTY Left 07/31/2015   Procedure: LEFT REVERSE SHOULDER ARTHROPLASTY;  Surgeon: Beverely Low, MD;  Location: [facility] OR;  Service: Orthopedics;  Laterality: Left;   TOTAL HIP ARTHROPLASTY Bilateral LEFT 2008 AND RIGHT 2010   TOTAL KNEE ARTHROPLASTY Left 09/17/2013   Procedure: LEFT TOTAL KNEE ARTHROPLASTY;  Surgeon: Shelda Pal, MD;  Location: [facility] ORS;  Service: Orthopedics;  Laterality: Left;    Allergies  Allergen Reactions  Albuterol Other (See Comments)    "Had some rapid heartbeat in 2013 when she took it for some bronchitis"   Diltiazem Other (See Comments)    "Pre-syncope, bp went a bit low for bp on 180 mg dose"     Naproxen Swelling   Other Nausea Only and Other (See Comments)    AN UNNAMED PAIN MEDICATION CAUSED NAUSEA   Cardizem [Diltiazem Hcl] Other (See Comments)    Hypotension   Nitrofurantoin Rash and Other (See Comments)    Sept., 2018      Outpatient Encounter Medications as of 05/16/2022  Medication Sig   amoxicillin-clavulanate (AUGMENTIN)  875-125 MG tablet Take 1 tablet by mouth every 12 (twelve) hours for 2 days.   Cyanocobalamin (VITAMIN B12) 1000 MCG TBCR Take 1,000 mcg by mouth daily.   Cyanocobalamin 1000 MCG/ML KIT Inject 1,000 mcg into the skin See admin instructions. Inject 1,000 mcg into the skin every 8 weeks   escitalopram (LEXAPRO) 20 MG tablet Take 20 mg by mouth daily.   feeding supplement (ENSURE ENLIVE / ENSURE PLUS) LIQD Take 237 mLs by mouth 3 (three) times daily between meals.   galantamine (RAZADYNE ER) 16 MG 24 hr capsule Take 16 mg by mouth daily with breakfast.   memantine (NAMENDA XR) 28 MG CP24 24 hr capsule Take 1 capsule (28 mg total) by mouth daily.   metoprolol tartrate (LOPRESSOR) 25 MG tablet Take 0.5 tablets (12.5 mg total) by mouth 2 (two) times daily.   pantoprazole (PROTONIX) 40 MG tablet Take 1 tablet (40 mg total) by mouth daily.   polyethylene glycol (MIRALAX / GLYCOLAX) 17 g packet Take 17 g by mouth daily as needed for mild constipation.   Probiotic Product (ALIGN) CHEW Chew 2 tablets by mouth daily.   [DISCONTINUED] atorvastatin (LIPITOR) 20 MG tablet Take 20 mg by mouth daily.   [DISCONTINUED] amoxicillin-clavulanate (AUGMENTIN) 875-125 MG per tablet 1 tablet    [DISCONTINUED] atorvastatin (LIPITOR) tablet 20 mg    [DISCONTINUED] diphenoxylate-atropine (LOMOTIL) 2.5-0.025 MG per tablet 1 tablet    [DISCONTINUED] enoxaparin (LOVENOX) injection 40 mg    [DISCONTINUED] escitalopram (LEXAPRO) tablet 20 mg    [DISCONTINUED] feeding supplement (ENSURE ENLIVE / ENSURE PLUS) liquid 237 mL    [DISCONTINUED] galantamine (RAZADYNE ER) 24 hr capsule 16 mg    [DISCONTINUED] guaiFENesin (MUCINEX) 12 hr tablet 1,200 mg    [DISCONTINUED] ipratropium (ATROVENT) nebulizer solution 0.5 mg    [DISCONTINUED] memantine (NAMENDA XR) 24 hr capsule 28 mg    [DISCONTINUED] metoprolol tartrate (LOPRESSOR) tablet 12.5 mg    [DISCONTINUED] Oral care mouth rinse    [DISCONTINUED] pantoprazole (PROTONIX) EC tablet  40 mg    [DISCONTINUED] pneumococcal 20-valent conjugate vaccine (PREVNAR 20) injection 0.5 mL    [DISCONTINUED] polyethylene glycol (MIRALAX / GLYCOLAX) packet 17 g    No facility-administered encounter medications on file as of 05/16/2022.    Review of Systems  Constitutional:  Negative for appetite change, chills, fatigue and fever.  HENT:  Negative for congestion, hearing loss, rhinorrhea and sore throat.   Eyes: Negative.   Respiratory:  Negative for cough, shortness of breath and wheezing.   Cardiovascular:  Negative for chest pain, palpitations and leg swelling.  Gastrointestinal:  Negative for abdominal pain, constipation, diarrhea, nausea and vomiting.  Genitourinary:  Negative for dysuria.  Musculoskeletal:  Negative for arthralgias, back pain and myalgias.  Skin:  Negative for color change, rash and wound.  Neurological:  Negative for dizziness, weakness and headaches.  Psychiatric/Behavioral:  Negative  for behavioral problems. The patient is not nervous/anxious.     Immunization History  Administered Date(s) Administered   DTaP, 5 pertussis antigens 05/11/2015   Influenza Split 04/27/2009, 08/28/2009, 09/29/2010, 11/08/2011, 10/26/2012, 11/08/2013   Influenza, High Dose Seasonal PF 11/13/2021   Influenza, Quadrivalent, Recombinant, Inj, Pf 10/05/2018, 10/07/2020   Influenza,inj,Quad PF,6+ Mos 10/29/2012, 11/08/2013, 11/22/2014   Influenza-Unspecified 10/03/2015, 10/18/2016   Pneumococcal Polysaccharide-23 08/19/2002, 01/18/2005, 04/27/2009, 08/28/2009   Pneumococcal-Unspecified 05/25/2012   Td (Adult),5 Lf Tetanus Toxid, Preservative Free 08/28/2009   Tdap 04/27/2009, 12/09/2010   Zoster Recombinat (Shingrix) 06/01/2017, 06/05/2017, 08/30/2017   Zoster, Live 08/08/2003, 04/27/2009, 05/28/2016   Pertinent  Health Maintenance Due  Topic Date Due   INFLUENZA VACCINE  08/18/2022   DEXA SCAN  Completed       No data to display         Functional Status Survey:     Vitals:   05/16/22 1513  BP: 112/65  Pulse: 88  Resp: 19  Temp: (!) 97 F (36.1 C)  SpO2: 95%  Weight: 136 lb 4 oz (61.8 kg)  Height: 5\' 4"  (1.626 m)   Body mass index is 23.39 kg/m. Physical Exam Constitutional:      Appearance: Normal appearance.  HENT:     Head: Normocephalic and atraumatic.     Nose: Nose normal.     Mouth/Throat:     Mouth: Mucous membranes are moist.  Eyes:     Conjunctiva/sclera: Conjunctivae normal.  Cardiovascular:     Rate and Rhythm: Normal rate and regular rhythm.  Pulmonary:     Effort: Pulmonary effort is normal.     Breath sounds: Normal breath sounds.  Abdominal:     General: Bowel sounds are normal.     Palpations: Abdomen is soft.  Musculoskeletal:        General: Normal range of motion.     Cervical back: Normal range of motion.  Skin:    General: Skin is warm and dry.  Neurological:     General: No focal deficit present.     Mental Status: She is alert and oriented to person, place, and time.  Psychiatric:        Mood and Affect: Mood normal.        Behavior: Behavior normal.        Thought Content: Thought content normal.        Judgment: Judgment normal.     Labs reviewed: Recent Labs    05/10/22 0452 05/13/22 0414 05/15/22 0414  NA 132* 134* 132*  K 3.6 3.8 4.3  CL 99 100 98  CO2 24 27 23   GLUCOSE 93 96 101*  BUN 15 11 12   CREATININE 0.78 0.74 0.78  CALCIUM 8.2* 8.3* 8.7*  PHOS  --   --  2.8   Recent Labs    05/09/22 1205 05/15/22 0414  AST 26  --   ALT 18  --   ALKPHOS 84  --   BILITOT 0.9  --   PROT 7.3  --   ALBUMIN 2.7* 2.6*   Recent Labs    05/09/22 1525 05/10/22 0452 05/13/22 0414 05/15/22 0414  WBC 10.1 7.9 6.3 8.8  NEUTROABS 7.9*  --   --  5.3  HGB 12.8 10.8* 10.8* 11.2*  HCT 39.8 32.9* 33.5* 34.4*  MCV 99.0 99.1 98.8 97.5  PLT 268 201 198 263   Lab Results  Component Value Date   TSH 2.980 03/17/2021   No results found for: "HGBA1C" No  results found for: "CHOL", "HDL",  "LDLCALC", "LDLDIRECT", "TRIG", "CHOLHDL"  Significant Diagnostic Results in last 30 days:  DG Swallowing Func-Speech Pathology  Result Date: 05/10/2022 Table formatting from the original result was not included. Modified Barium Swallow Study Patient Details Name: [name] [name] MRN: [id number] Date of Birth: 1931/01/10 Today's Date: 05/10/2022 HPI/PMH: HPI: [name] [name] is a 89 y.o. female with medical history significant of hypertension, hyperlipidemia, breast cancer in remission, anxiety, COPD who was brought by her niece from friends home for follow-up of pneumonia.  Patient was diagnosed with pneumonia by her PCP about 3 to 4 weeks ago and was put on oral antibiotics.  Initially she was started on Augmentin which did not help.  Follow-up chest x-ray showed unimproved pneumonia and she was started on Levaquin.  Currently she is on her fifth day of the course.  Patient has being short of breath, weak mostly when she ambulates . Clinical Impression: Pt demonstrates signs of significant esophageal dysphagia. Oral and oropharyngeal function WNL. After a few sips of liquids and puree pt made and face and reported "its not going down." Observed pt masticate and cracker and exhibit effortful swallow without oral or oropharyngeal impairment, then swept to the esophagus with severe distal residue without observable persitalsis. Liquid wash did not clear stasis. Pill not given. Pt denies regurgitation but is at risk for postprandial aspiration, particularly if laying in bed.  Possible that this is contributing to recurrent pna. Keep HOB at 30 degrees or higher. Crush medication. Mobilize pt after meals. Use liquid dietary supplements. No benefit of modifying solid texture as pt has severe residue with puree and thin. Continue regular diet and thin liquids. Pt may benefit from f/u with GI depending on pt/family desire to address problem medically. Will f/u for further education with family. Pt is unable to result of  test or strategies. Called Niece and left message. Factors that may increase risk of adverse event in presence of aspiration Rubye Oaks & Clearance Coots 2021): Factors that may increase risk of adverse event in presence of aspiration Rubye Oaks & Clearance Coots 2021): Reduced cognitive function; Frail or deconditioned; Aspiration of thick, dense, and/or acidic materials Recommendations/Plan: Swallowing Evaluation Recommendations Swallowing Evaluation Recommendations Recommendations: PO diet PO Diet Recommendation: Regular; Thin liquids (Level 0) Liquid Administration via: Cup; Straw Medication Administration: Whole meds with liquid Supervision: Patient able to self-feed Swallowing strategies  : Slow rate; Small bites/sips; Follow solids with liquids Postural changes: Out of bed for meals (walk a bit after meals) Oral care recommendations: Oral care BID (2x/day) Recommended consults: Consider GI consultation Treatment Plan Treatment Plan Treatment recommendations: Therapy as outlined in treatment plan below Follow-up recommendations: Home health SLP Functional status assessment: Patient has had a recent decline in their functional status and demonstrates the ability to make significant improvements in function in a reasonable and predictable amount of time. Treatment frequency: Min 2x/week Treatment duration: 1 week Interventions: Aspiration precaution training; Patient/family education; Compensatory techniques Recommendations Recommendations for follow up therapy are one component of a multi-disciplinary discharge planning process, led by the attending physician.  Recommendations may be updated based on patient status, additional functional criteria and insurance authorization. Assessment: Orofacial Exam: Orofacial Exam Oral Cavity - Dentition: Adequate natural dentition Anatomy: No data recorded Boluses Administered: Boluses Administered Boluses Administered: Thin liquids (Level 0); Mildly thick liquids (Level 2, nectar thick);  Moderately thick liquids (Level 3, honey thick); Puree; Solid  Oral Impairment Domain: Oral Impairment Domain Lip Closure: No labial escape Tongue control during bolus  hold: Cohesive bolus between tongue to palatal seal Bolus preparation/mastication: Slow prolonged chewing/mashing with complete recollection Bolus transport/lingual motion: Delayed initiation of tongue motion (oral holding) Oral residue: Complete oral clearance Location of oral residue : N/A  Pharyngeal Impairment Domain: Pharyngeal Impairment Domain Soft palate elevation: No bolus between soft palate (SP)/pharyngeal wall (PW) Laryngeal elevation: Complete superior movement of thyroid cartilage with complete approximation of arytenoids to epiglottic petiole Anterior hyoid excursion: Complete anterior movement Epiglottic movement: Complete inversion Laryngeal vestibule closure: Complete, no air/contrast in laryngeal vestibule Pharyngeal stripping wave : Present - complete Pharyngeal contraction (A/P view only): N/A Pharyngoesophageal segment opening: Partial distention/partial duration, partial obstruction of flow Pharyngeal residue: Trace residue within or on pharyngeal structures Location of pharyngeal residue: Pharyngeal wall; Pyriform sinuses  Esophageal Impairment Domain: Esophageal Impairment Domain Esophageal clearance upright position: Esophageal retention Pill: Esophageal Impairment Domain Esophageal clearance upright position: Esophageal retention Penetration/Aspiration Scale Score: Penetration/Aspiration Scale Score 1.  Material does not enter airway: Thin liquids (Level 0); Mildly thick liquids (Level 2, nectar thick); Moderately thick liquids (Level 3, honey thick); Solid; Puree Compensatory Strategies: Compensatory Strategies Compensatory strategies: Yes Liquid wash: Ineffective   General Information: Caregiver present: No  Diet Prior to this Study: Regular; Thin liquids (Level 0)   Temperature : Normal   Respiratory Status: WFL    Supplemental O2: Nasal cannula   No data recorded Behavior/Cognition: Alert; Cooperative; Pleasant mood Self-Feeding Abilities: Able to self-feed Baseline vocal quality/speech: Normal Volitional Cough: Able to elicit Volitional Swallow: Able to elicit No data recorded Goal Planning: Prognosis for improved oropharyngeal function: Guarded Barriers to Reach Goals: Severity of deficits No data recorded No data recorded No data recorded Pain: No data recorded End of Session: Start Time:SLP Start Time (ACUTE ONLY): 1400 Stop Time: SLP Stop Time (ACUTE ONLY): 1415 Time Calculation:SLP Time Calculation (min) (ACUTE ONLY): 15 min Charges: SLP Evaluations $ SLP Speech Visit: 1 Visit SLP Evaluations $BSS Swallow: 1 Procedure $MBS Swallow: 1 Procedure SLP visit diagnosis: SLP Visit Diagnosis: Dysphagia, unspecified (R13.10) Past Medical History: Past Medical History: Diagnosis Date  Anxiety   Bradycardia   asymptomatic  Breast CA 1993 OR 1994  LEFT, SURGERY AND RADIATION DONE  COPD (chronic obstructive pulmonary disease)   MILD, NO INHALERS USED  DEGENERATIVE JOINT DISEASE, KNEE   Family history of anesthesia complication   NEPHEW NAUSEA/VOMITING  GENU VALGUM   Hyperlipidemia   Hypertension   Numbness of leg 2011  just left shin  OTHER ACQUIRED DEFORMITY OF ANKLE AND FOOT OTHER   Pneumonia 2013  X 2  PVC's (premature ventricular contractions)   ROTATOR CUFF SYNDROME, LEFT   CANNOT LIFT LEFT ARM ALL THE WAY UP  UNEQUAL LEG LENGTH   UTI (urinary tract infection)   STARTED AUGMENTIN  ON 09-10-13 Past Surgical History: Past Surgical History: Procedure Laterality Date  APPENDECTOMY  AGE 37  BREAST LUMPECTOMY Left 1991  radiation  BREAST SURGERY Left 1993 OR 1994  LUMPECTOMY AND RADIATION DONE  JOINT REPLACEMENT    NASAL SINUS SURGERY  2006  REVERSE SHOULDER ARTHROPLASTY Left 07/31/2015  Procedure: LEFT REVERSE SHOULDER ARTHROPLASTY;  Surgeon: Beverely Low, MD;  Location: [facility] OR;  Service: Orthopedics;  Laterality: Left;  TOTAL HIP  ARTHROPLASTY Bilateral LEFT 2008 AND RIGHT 2010  TOTAL KNEE ARTHROPLASTY Left 09/17/2013  Procedure: LEFT TOTAL KNEE ARTHROPLASTY;  Surgeon: Shelda Pal, MD;  Location: [facility];  Service: Orthopedics;  Laterality: Left; DeBlois, Riley Nearing 05/10/2022, 2:41 PM  ECHOCARDIOGRAM COMPLETE  Result Date: 05/10/2022  ECHOCARDIOGRAM REPORT   Patient Name:   [name] [name] Wiechman Date of Exam: 05/10/2022 Medical Rec #:  [id number]        Height:       64.0 in Accession #:    [id number]       Weight:       136.0 lb Date of Birth:  Feb 26, 1930         BSA:          1.661 m Patient Age:    89 years         BP:           116/68 mmHg Patient Gender: F                HR:           58 bpm. Exam Location:  Inpatient Procedure: 2D Echo, Cardiac Doppler and Color Doppler Indications:    Acute respiratory distress  History:        Patient has no prior history of Echocardiogram examinations.                 COPD; Risk Factors:Hypertension and Dyslipidemia.  Sonographer:    Mike Gip Referring Phys: [id number] AMRIT ADHIKARI  Sonographer Comments: Technically difficult study due to poor echo windows. Image acquisition challenging due to breast implants. IMPRESSIONS  1. Left ventricular ejection fraction, by estimation, is 50 to 55%. The left ventricle has low normal function. The left ventricle has no regional wall motion abnormalities. Left ventricular diastolic parameters are consistent with Grade I diastolic dysfunction (impaired relaxation).  2. Right ventricular systolic function is normal. The right ventricular size is mildly enlarged. There is mildly elevated pulmonary artery systolic pressure. The estimated right ventricular systolic pressure is 44.0 mmHg.  3. Right atrial size was mildly dilated.  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 Trivial mitral valve regurgitation. No evidence of mitral stenosis. There is mild late systolic prolapse of both leaflets of the mitral valve.  5. The tricuspid valve is myxomatous. Tricuspid valve  regurgitation is mild to moderate.  6. The aortic valve is tricuspid. There is mild calcification of the aortic valve. Aortic valve regurgitation is mild to moderate. Aortic valve sclerosis is present, with no evidence of aortic valve stenosis.  7. The inferior vena cava is dilated in size with >50% respiratory variability, suggesting right atrial pressure of 8 mmHg. FINDINGS  Left Ventricle: Left ventricular ejection fraction, by estimation, is 50 to 55%. The left ventricle has low normal function. The left ventricle has no regional wall motion abnormalities. The left ventricular internal cavity size was normal in size. There is no left ventricular hypertrophy. Left ventricular diastolic parameters are consistent with Grade I diastolic dysfunction (impaired relaxation). Normal left ventricular filling pressure. Right Ventricle: The right ventricular size is mildly enlarged. No increase in right ventricular wall thickness. Right ventricular systolic function is normal. There is mildly elevated pulmonary artery systolic pressure. The tricuspid regurgitant velocity is 3.00 m/s, and with an assumed right atrial pressure of 8 mmHg, the estimated right ventricular systolic pressure is 44.0 mmHg. Left Atrium: Left atrial size was normal in size. Right Atrium: Right atrial size was mildly dilated. Pericardium: There is no evidence of pericardial effusion. Mitral Valve: The mitral valve is myxomatous. There is mild late systolic prolapse of both leaflets of the mitral valve. Trivial mitral valve regurgitation. No evidence of mitral valve stenosis. Tricuspid Valve: The tricuspid valve is myxomatous. Tricuspid valve regurgitation is mild to moderate. No evidence  of tricuspid stenosis. There is mild prolapse of the tricuspid. Aortic Valve: The aortic valve is tricuspid. There is mild calcification of the aortic valve. Aortic valve regurgitation is mild to moderate. Aortic regurgitation PHT measures 555 msec. Aortic valve  sclerosis is present, with no evidence of aortic valve stenosis. Pulmonic Valve: The pulmonic valve was normal in structure. Pulmonic valve regurgitation is mild. No evidence of pulmonic stenosis. Aorta: The aortic root is normal in size and structure. Venous: The inferior vena cava is dilated in size with greater than 50% respiratory variability, suggesting right atrial pressure of 8 mmHg. IAS/Shunts: No atrial level shunt detected by color flow Doppler.  LEFT VENTRICLE PLAX 2D LVIDd:         4.90 cm     Diastology LVIDs:         3.70 cm     LV e' medial:    4.57 cm/s LV PW:         0.90 cm     LV E/e' medial:  10.4 LV IVS:        1.00 cm     LV e' lateral:   9.14 cm/s LVOT diam:     2.00 cm     LV E/e' lateral: 5.2 LV SV:         47 LV SV Index:   28 LVOT Area:     3.14 cm  LV Volumes (MOD) LV vol d, MOD A2C: 92.6 ml LV vol d, MOD A4C: 89.6 ml LV vol s, MOD A2C: 46.1 ml LV vol s, MOD A4C: 44.5 ml LV SV MOD A2C:     46.5 ml LV SV MOD A4C:     89.6 ml LV SV MOD BP:      48.1 ml RIGHT VENTRICLE             IVC RV Basal diam:  5.00 cm     IVC diam: 2.30 cm RV S prime:     12.60 cm/s TAPSE (M-mode): 1.8 cm LEFT ATRIUM             Index        RIGHT ATRIUM           Index LA diam:        2.00 cm 1.20 cm/m   RA Area:     21.80 cm LA Vol (A2C):   62.2 ml 37.45 ml/m  RA Volume:   80.70 ml  48.59 ml/m LA Vol (A4C):   41.1 ml 24.75 ml/m LA Biplane Vol: 50.5 ml 30.41 ml/m  AORTIC VALVE                   PULMONIC VALVE LVOT Vmax:         72.50 cm/s  PR End Diast Vel: 2.46 msec LVOT Vmean:        44.800 cm/s LVOT VTI:          0.149 m AI PHT:            555 msec AR Vena Contracta: 0.40 cm  AORTA Ao Root diam: 3.10 cm Ao Asc diam:  3.40 cm MITRAL VALVE               TRICUSPID VALVE MV Area (PHT): 5.97 cm    TR Peak grad:   36.0 mmHg MV Decel Time: 127 msec    TR Vmax:        300.00 cm/s MV E velocity: 47.60 cm/s MV A velocity: 53.60 cm/s  SHUNTS MV E/A ratio:  0.89        Systemic VTI:  0.15 m                             Systemic Diam: 2.00 cm Thurmon Fair MD Electronically signed by Thurmon Fair MD Signature Date/Time: 05/10/2022/12:28:25 PM    Final    CT Angio Chest PE W and/or Wo Contrast  Result Date: 05/09/2022 CLINICAL DATA:  Rule out pulmonary embolism.  History of pneumonia. EXAM: CT ANGIOGRAPHY CHEST WITH CONTRAST TECHNIQUE: Multidetector CT imaging of the chest was performed using the standard protocol during bolus administration of intravenous contrast. Multiplanar CT image reconstructions and MIPs were obtained to evaluate the vascular anatomy. RADIATION DOSE REDUCTION: This exam was performed according to the departmental dose-optimization program which includes automated exposure control, adjustment of the mA and/or kV according to patient size and/or use of iterative reconstruction technique. CONTRAST:  75mL OMNIPAQUE IOHEXOL 350 MG/ML SOLN COMPARISON:  10/04/16 FINDINGS: Cardiovascular: Satisfactory opacification of the pulmonary arteries to the segmental level. No evidence of pulmonary embolism. Multi chamber cardiac enlargement. Increase caliber of the main pulmonary artery compatible with PA hypertension. Aortic atherosclerosis and coronary artery calcifications. No pericardial effusion. Mediastinum/Nodes: No enlarged mediastinal, hilar, or axillary lymph nodes. Thyroid gland, trachea, and esophagus demonstrate no significant findings. Lungs/Pleura: There is no pneumothorax identified. No pleural effusion. Signs of chronic interstitial lung disease identified with bilateral peripheral predominant and lower lung zone predominant interstitial reticulation, ground-glass attenuation, architectural distortion and bronchiectasis. Multifocal bilateral areas of consolidation change is identified. This is most severe within the posterolateral right upper lobe where there is a large geographic area of airspace consolidation measuring 8.0 x 3.9 cm, image 32/7. Additional, smaller areas of consolidative change  identified within the posterior subpleural right lower lobe and subpleural medial right upper lobe the and periphery of the left upper lobe. Upper Abdomen: No acute abnormality. Musculoskeletal: Multilevel spondylosis identified throughout the thoracic spine. Mild curvature of the thoracic spine is convex towards the right. No acute abnormality. Review of the MIP images confirms the above findings. IMPRESSION: 1. No evidence for acute pulmonary embolus. 2. Signs of chronic interstitial lung disease with bilateral peripheral predominant and lower lung zone predominant interstitial reticulation, ground-glass attenuation, architectural distortion and bronchiectasis. Consider follow-up imaging with high-resolution CT of the chest when the patient is current, acute clinical situation has resolved. 3. Multifocal bilateral areas of consolidation are identified. This is most severe within the posterolateral right upper lobe where there is a large geographic area of airspace consolidation measuring 8.0 x 3.9 cm. Findings are favored to represent multifocal pneumonia. Follow-up imaging is recommended to ensure resolution and to rule out underlying malignancy. 4. Multi chamber cardiac enlargement with coronary artery calcifications. 5. Increase caliber of the main pulmonary artery compatible with PA hypertension. 6.  Aortic Atherosclerosis (ICD10-I70.0). Electronically Signed   By: Signa Kell M.D.   On: 05/09/2022 14:31   DG Chest 2 View  Result Date: 05/09/2022 CLINICAL DATA:  Shortness of breath. Personal history of breast cancer. EXAM: CHEST - 2 VIEW COMPARISON:  09/11/2013 FINDINGS: Lungs are hyperexpanded. Diffuse interstitial opacity is progressive in the interval with right greater than left patchy relatively diffuse bilateral airspace disease showing nodular configuration in some regions. No pleural effusion. The cardiopericardial silhouette is within normal limits for size. Status post left shoulder  arthroplasty. IMPRESSION: Interval progression of diffuse interstitial opacity with right greater than left  patchy relatively diffuse bilateral airspace disease showing nodular configuration in some regions. Findings could reflect multifocal pneumonia although given the history of breast cancer, metastatic disease not excluded. CT chest may prove helpful to further evaluate. Electronically Signed   By: Kennith Center M.D.   On: 05/09/2022 12:32    Assessment/Plan  1. Multifocal pneumonia -  pulmonology was consulted -  imaging showed ILD features and autoimmune panel are mostly negative except for ANA, rheumatoid factor, ESR and CRP -  follow up with Pulmonology -  likely from chronic aspiration -  was started on Cefepime and Vancomycin then transitioned to Augmentin  2. Primary hypertension -  BP 112/65, stable -  continue Metoprolol tartrate  3. Oropharyngeal dysphagia -  aspiration precaution -  for Speech Therapy evaluation and treatment  4. Major depression, recurrent, chronic (HCC) -  continue Lexapro  5. Gastroesophageal reflux disease without esophagitis -  continue Pantoprazole  6. Dementia -  continue Memantine and Audiological scientist Communication:   Discussed plan of care with resident and charge nurse.  Labs/tests ordered:  for CBC and BMP in 4-5 days

## 2022-05-16 NOTE — TOC Transition Note (Signed)
Transition of Care [facility]) - CM/SW Discharge Note   Patient Details  Name: [name] [name] MRN: [id number] Date of Birth: 25-Feb-1930  [facility] CM/SW Contact:  Howell Rucks, RN Phone Number: 05/16/2022, 10:16 AM   Clinical Narrative:   DC order and summary in place to [facility].Call to [facility] to inform of dc. RM [address], Nurse cal report to [phone], [phone]. Olegario Messier given pending auth# and number to call to check on status of [employer].  [facility] called for transport. No further TOC needs identified at this time.     Final next level of care: Skilled Nursing Facility Barriers to Discharge: No Barriers Identified   Patient Goals and CMS Choice CMS Medicare.gov Compare Post Acute Care list provided to:: Patient Choice offered to / list presented to : Patient  Discharge Placement                Patient chooses bed at: [facility] Patient to be transferred to facility by: [facility] Name of family member notified: Jeanice Lim (niece) Patient and family notified of of transfer: 05/16/22  Discharge Plan and Services Additional resources added to the After Visit Summary for                                       Social Determinants of Health (SDOH) Interventions SDOH Screenings   Food Insecurity: No Food Insecurity (05/10/2022)  Housing: Low Risk  (05/10/2022)  Transportation Needs: No Transportation Needs (05/10/2022)  Utilities: Not At Risk (05/10/2022)  Tobacco Use: Medium Risk (05/10/2022)     Readmission Risk Interventions    05/11/2022    4:19 PM  Readmission Risk Prevention Plan  Post Dischage Appt Complete  Transportation Screening Complete

## 2022-05-16 NOTE — Progress Notes (Signed)
Attempted to call report [phone] ext [phone]. Voicemail message left for Sonya.

## 2022-05-16 NOTE — Discharge Summary (Signed)
[facility]  Physician Discharge Summary   Patient ID: [name] [name] MRN: [id number] DOB/AGE: 1930-06-06 89 y.o.  Admit date: 05/09/2022 Discharge date:   05/16/2022   PCP: Rodrigo Ran, MD  DISCHARGE DIAGNOSES:    PNA (pneumonia), aspiration versus community-acquired. Acute respiratory failure with hypoxia   Hypertension   Depression   COPD (chronic obstructive pulmonary disease) (HCC)   Breast cancer (HCC)   RECOMMENDATIONS FOR OUTPATIENT FOLLOW UP: Please check CBC and basic metabolic panel in 4 to 5 days.   Home Health: Going to SNF Equipment/Devices: Home oxygen  CODE STATUS: DNR  DISCHARGE CONDITION: fair  Diet recommendation: Regular as tolerated  INITIAL HISTORY: Patient  is a 89 y.o. female with medical history significant of hypertension, hyperlipidemia, breast cancer in remission, anxiety, COPD who was brought by her niece from friends home for follow-up of pneumonia.  Patient was diagnosed with pneumonia by her PCP about 3 to 4 weeks ago and was put on oral antibiotics.  Initially she was started on Augmentin which did not help.  Follow-up chest x-ray showed unimproved pneumonia and she was started on Levaquin.   Patient remained short of breath, weak mostly when she ambulates. Chest imaging showed multifocal pneumonia/ILD like picture.  Pulmonology consulted .  MBS showed significant esophageal dysphagia, so likely this is all from chronic aspiration.     HOSPITAL COURSE:   Multifocal pneumonia/concern for aspiration pneumonia Patient was recently diagnosed with community-acquired pneumonia by her PCP.  Treated with azithromycin without benefit so antibiotics changed to Levaquin.  No fever or chills.  No leukocytosis. Lactate level normal.  Negative procalcitonin. Negative COVID screening.  Respiratory viral panel unremarkable Patient was started on broad-spectrum antibiotics vancomycin and cefepime. Cultures now growth till date.   Chest imaging  showed features of ILD, multifocal bilateral consolidations.  No pulmonary embolism. MBS showed significant esophageal dysphagia, DG esophagram negative. Pulmonary team thinks that this is most likely from chronic aspiration.  Autoimmune panel sent most of them are negative except for ANA, rheumatoid factor, ESR and CRP.  She will be followed by pulmonology as an outpatient. Seen by speech therapy.  On regular diet with thin liquids.  Needs to be sitting in an upright position while eating. Antibiotics changed over to Augmentin.     Acute hypoxic aspiratory failure Patient desaturated into the 80s with ambulation with physical therapy.  Will benefit from oxygen at discharge.     Essential hypertension Her antihypertensives were held due to soft blood pressure.  Telemetry shows evidence for occasional SVTs.  Beta-blocker was resumed at a lower dose.  SVTs have resolved.   History of COPD Continue bronchodilators as needed.  Past smoker   Dysphagia Plan as above.  Speech therapy recommending solid diet, thin liquid   Hyperlipidemia Takes Lipitor   History of depression On Lexapro.   History of dementia Noted to be on galantamine and memantine.   History of left breast cancer Currently in remission   Elevated BNP Echo showed EF of 50 to 55%, grade 1 diastolic dysfunction.  Currently appears euvolemic.   History of GERD Continue Protonix   Reported chest pain: -Resolved. -Negative workup.   Normocytic anemia No evidence of overt bleeding.   Hyponatremia Stable.   Sacral decubitus stage I Pressure Injury 05/10/22 Sacrum Stage 1 -  Intact skin with non-blanchable redness of a localized area usually over a bony prominence. (Active)  05/10/22 1500  Location: Sacrum  Location Orientation:   Staging: Stage 1 -  Intact skin with non-blanchable redness of a localized area usually over a bony prominence.  Wound Description (Comments):   Present on Admission:    Patient is  stable.  Okay for discharge to SNF.  Home oxygen has been ordered.   PERTINENT LABS:  The results of significant diagnostics from this hospitalization (including imaging, microbiology, ancillary and laboratory) are listed below for reference.    Microbiology: Recent Results (from the past 240 hour(s))  Blood culture (routine x 2)     Status: None   Collection Time: 05/09/22  3:25 PM   Specimen: Left Antecubital; Blood  Result Value Ref Range Status   Specimen Description   Final    LEFT ANTECUBITAL BLOOD Performed at [facility], [address] [place], [place] [address]    Special Requests   Final    BOTTLES DRAWN AEROBIC AND ANAEROBIC Blood Culture results may not be optimal due to an excessive volume of blood received in culture bottles Performed at [facility], [address], [place], [place] [address]    Culture   Final    NO GROWTH 5 DAYS Performed at [facility], [address] [place], [place] [address]    Report Status 05/14/2022 FINAL  Final  Blood culture (routine x 2)     Status: None   Collection Time: 05/09/22  3:25 PM   Specimen: BLOOD  Result Value Ref Range Status   Specimen Description   Final    BLOOD SPECIMEN SOURCE NOT MARKED ON REQUISITION Performed at [facility], [address], [place], [place] [address]    Special Requests   Final    BOTTLES DRAWN AEROBIC AND ANAEROBIC Blood Culture results may not be optimal due to an excessive volume of blood received in culture bottles Performed at [facility], [address] [place], [place] [address]    Culture   Final    NO GROWTH 5 DAYS Performed at [facility], [address], [place], [place] [address]    Report Status 05/14/2022 FINAL  Final  SARS Coronavirus 2 by RT PCR (hospital order, performed in [facility] hospital lab) *cepheid single result test* Anterior Nasal Swab     Status: None   Collection Time: 05/10/22   2:01 AM   Specimen: Anterior Nasal Swab  Result Value Ref Range Status   SARS Coronavirus 2 by RT PCR NEGATIVE NEGATIVE Final    Comment: (NOTE) SARS-CoV-2 target nucleic acids are NOT DETECTED.  The SARS-CoV-2 RNA is generally detectable in upper and lower respiratory specimens during the acute phase of infection. The lowest concentration of SARS-CoV-2 viral copies this assay can detect is 250 copies / mL. A negative result does not preclude SARS-CoV-2 infection and should not be used as the sole basis for treatment or other patient management decisions.  A negative result may occur with improper specimen collection / handling, submission of specimen other than nasopharyngeal swab, presence of viral mutation(s) within the areas targeted by this assay, and inadequate number of viral copies (<250 copies / mL). A negative result must be combined with clinical observations, patient history, and epidemiological information.  Fact Sheet for Patients:   RoadLapTop.co.za  Fact Sheet for Healthcare Providers: http://kim-miller.com/  This test is not yet approved or  cleared by the [place] FDA and has been authorized for detection and/or diagnosis of SARS-CoV-2 by FDA under an Emergency Use Authorization (EUA).  This EUA will remain in  effect (meaning this test can be used) for the duration of the COVID-19 declaration under Section 564(b)(1) of the Act, 21 U.S.C. section 360bbb-3(b)(1), unless the authorization is terminated or revoked sooner.  Performed at [facility], [address] [place], [place] [address]   MRSA Next Gen by PCR, Nasal     Status: None   Collection Time: 05/10/22  2:01 AM   Specimen: Nasal Mucosa; Nasal Swab  Result Value Ref Range Status   MRSA by PCR Next Gen NOT DETECTED NOT DETECTED Final    Comment: (NOTE) The GeneXpert MRSA Assay (FDA approved for NASAL specimens only), is one  component of a comprehensive MRSA colonization surveillance program. It is not intended to diagnose MRSA infection nor to guide or monitor treatment for MRSA infections. Test performance is not FDA approved in patients less than 61 years old. Performed at [facility], [address], [place], [place] [address]   Respiratory (~20 pathogens) panel by PCR     Status: None   Collection Time: 05/10/22 10:26 AM   Specimen: Nasopharyngeal Swab; Respiratory  Result Value Ref Range Status   Adenovirus NOT DETECTED NOT DETECTED Final   Coronavirus 229E NOT DETECTED NOT DETECTED Final    Comment: (NOTE) The Coronavirus on the Respiratory Panel, DOES NOT test for the novel  Coronavirus (2019 nCoV)    Coronavirus HKU1 NOT DETECTED NOT DETECTED Final   Coronavirus NL63 NOT DETECTED NOT DETECTED Final   Coronavirus OC43 NOT DETECTED NOT DETECTED Final   Metapneumovirus NOT DETECTED NOT DETECTED Final   Rhinovirus / Enterovirus NOT DETECTED NOT DETECTED Final   Influenza A NOT DETECTED NOT DETECTED Final   Influenza B NOT DETECTED NOT DETECTED Final   Parainfluenza Virus 1 NOT DETECTED NOT DETECTED Final   Parainfluenza Virus 2 NOT DETECTED NOT DETECTED Final   Parainfluenza Virus 3 NOT DETECTED NOT DETECTED Final   Parainfluenza Virus 4 NOT DETECTED NOT DETECTED Final   Respiratory Syncytial Virus NOT DETECTED NOT DETECTED Final   Bordetella pertussis NOT DETECTED NOT DETECTED Final   Bordetella Parapertussis NOT DETECTED NOT DETECTED Final   Chlamydophila pneumoniae NOT DETECTED NOT DETECTED Final   Mycoplasma pneumoniae NOT DETECTED NOT DETECTED Final    Comment: Performed at [facility], [address] [place], [place] [address]     Labs:   Basic Metabolic Panel: Recent Labs  Lab 05/09/22 1205 05/10/22 0452 05/13/22 0414 05/15/22 0414  NA 131* 132* 134* 132*  K 3.6 3.6 3.8 4.3  CL 96* 99 100 98  CO2 26 24 27 23   GLUCOSE 94 93 96 101*  BUN 15 15 11 12    CREATININE 0.80 0.78 0.74 0.78  CALCIUM 8.4* 8.2* 8.3* 8.7*  PHOS  --   --   --  2.8   Liver Function Tests: Recent Labs  Lab 05/09/22 1205 05/15/22 0414  AST 26  --   ALT 18  --   ALKPHOS 84  --   BILITOT 0.9  --   PROT 7.3  --   ALBUMIN 2.7* 2.6*    CBC: Recent Labs  Lab 05/09/22 1525 05/10/22 0452 05/13/22 0414 05/15/22 0414  WBC 10.1 7.9 6.3 8.8  NEUTROABS 7.9*  --   --  5.3  HGB 12.8 10.8* 10.8* 11.2*  HCT 39.8 32.9* 33.5* 34.4*  MCV 99.0 99.1 98.8 97.5  PLT 268 201 198 263    BNP: BNP (last 3 results) Recent Labs    05/09/22  1205  BNP 218.0*     IMAGING STUDIES DG ESOPHAGUS W SINGLE CM (SOL OR THIN BA)  Result Date: 05/11/2022 CLINICAL DATA:  Dysphasia.  Concern for aspiration. EXAM: ESOPHOGRAM/BARIUM SWALLOW TECHNIQUE: Single contrast examination was performed using  thin barium. FLUOROSCOPY: Radiation Exposure Index (as provided by the fluoroscopic device): 3.1 mGy mGy Kerma COMPARISON:  Modified barium swallow from previous day FINDINGS: The oral and oropharyngeal function was normal on the oropharyngeal motility study from the prior day. The patient took sequential sips thin barium in the upright LPO orientation. No signs of aspiration, esophageal stricture or mass. The esophagus empties normally. Mild esophageal dysmotility noted with diminished primary peristalsis. IMPRESSION: 1. No evidence for aspiration, esophageal stricture or mass. Normal emptying of the esophagus in the upright orientation. 2. Mild esophageal dysmotility with diminished primary peristalsis. Electronically Signed   By: Signa Kell M.D.   On: 05/11/2022 13:09   DG Swallowing Func-Speech Pathology  Result Date: 05/10/2022 Table formatting from the original result was not included. Modified Barium Swallow Study Patient Details Name: [name] [name] MRN: [id number] Date of Birth: 1931/01/02 Today's Date: 05/10/2022 HPI/PMH: HPI: QUNISHA BRYK is a 89 y.o. female with medical history  significant of hypertension, hyperlipidemia, breast cancer in remission, anxiety, COPD who was brought by her niece from friends home for follow-up of pneumonia.  Patient was diagnosed with pneumonia by her PCP about 3 to 4 weeks ago and was put on oral antibiotics.  Initially she was started on Augmentin which did not help.  Follow-up chest x-ray showed unimproved pneumonia and she was started on Levaquin.  Currently she is on her fifth day of the course.  Patient has being short of breath, weak mostly when she ambulates . Clinical Impression: Pt demonstrates signs of significant esophageal dysphagia. Oral and oropharyngeal function WNL. After a few sips of liquids and puree pt made and face and reported "its not going down." Observed pt masticate and cracker and exhibit effortful swallow without oral or oropharyngeal impairment, then swept to the esophagus with severe distal residue without observable persitalsis. Liquid wash did not clear stasis. Pill not given. Pt denies regurgitation but is at risk for postprandial aspiration, particularly if laying in bed.  Possible that this is contributing to recurrent pna. Keep HOB at 30 degrees or higher. Crush medication. Mobilize pt after meals. Use liquid dietary supplements. No benefit of modifying solid texture as pt has severe residue with puree and thin. Continue regular diet and thin liquids. Pt may benefit from f/u with GI depending on pt/family desire to address problem medically. Will f/u for further education with family. Pt is unable to result of test or strategies. Called Niece and left message. Factors that may increase risk of adverse event in presence of aspiration Rubye Oaks & Clearance Coots 2021): Factors that may increase risk of adverse event in presence of aspiration Rubye Oaks & Clearance Coots 2021): Reduced cognitive function; Frail or deconditioned; Aspiration of thick, dense, and/or acidic materials Recommendations/Plan: Swallowing Evaluation Recommendations  Swallowing Evaluation Recommendations Recommendations: PO diet PO Diet Recommendation: Regular; Thin liquids (Level 0) Liquid Administration via: Cup; Straw Medication Administration: Whole meds with liquid Supervision: Patient able to self-feed Swallowing strategies  : Slow rate; Small bites/sips; Follow solids with liquids Postural changes: Out of bed for meals (walk a bit after meals) Oral care recommendations: Oral care BID (2x/day) Recommended consults: Consider GI consultation Treatment Plan Treatment Plan Treatment recommendations: Therapy as outlined in treatment plan below Follow-up recommendations: Home health SLP Functional  status assessment: Patient has had a recent decline in their functional status and demonstrates the ability to make significant improvements in function in a reasonable and predictable amount of time. Treatment frequency: Min 2x/week Treatment duration: 1 week Interventions: Aspiration precaution training; Patient/family education; Compensatory techniques Recommendations Recommendations for follow up therapy are one component of a multi-disciplinary discharge planning process, led by the attending physician.  Recommendations may be updated based on patient status, additional functional criteria and insurance authorization. Assessment: Orofacial Exam: Orofacial Exam Oral Cavity - Dentition: Adequate natural dentition Anatomy: No data recorded Boluses Administered: Boluses Administered Boluses Administered: Thin liquids (Level 0); Mildly thick liquids (Level 2, nectar thick); Moderately thick liquids (Level 3, honey thick); Puree; Solid  Oral Impairment Domain: Oral Impairment Domain Lip Closure: No labial escape Tongue control during bolus hold: Cohesive bolus between tongue to palatal seal Bolus preparation/mastication: Slow prolonged chewing/mashing with complete recollection Bolus transport/lingual motion: Delayed initiation of tongue motion (oral holding) Oral residue: Complete oral  clearance Location of oral residue : N/A  Pharyngeal Impairment Domain: Pharyngeal Impairment Domain Soft palate elevation: No bolus between soft palate (SP)/pharyngeal wall (PW) Laryngeal elevation: Complete superior movement of thyroid cartilage with complete approximation of arytenoids to epiglottic petiole Anterior hyoid excursion: Complete anterior movement Epiglottic movement: Complete inversion Laryngeal vestibule closure: Complete, no air/contrast in laryngeal vestibule Pharyngeal stripping wave : Present - complete Pharyngeal contraction (A/P view only): N/A Pharyngoesophageal segment opening: Partial distention/partial duration, partial obstruction of flow Pharyngeal residue: Trace residue within or on pharyngeal structures Location of pharyngeal residue: Pharyngeal wall; Pyriform sinuses  Esophageal Impairment Domain: Esophageal Impairment Domain Esophageal clearance upright position: Esophageal retention Pill: Esophageal Impairment Domain Esophageal clearance upright position: Esophageal retention Penetration/Aspiration Scale Score: Penetration/Aspiration Scale Score 1.  Material does not enter airway: Thin liquids (Level 0); Mildly thick liquids (Level 2, nectar thick); Moderately thick liquids (Level 3, honey thick); Solid; Puree Compensatory Strategies: Compensatory Strategies Compensatory strategies: Yes Liquid wash: Ineffective   General Information: Caregiver present: No  Diet Prior to this Study: Regular; Thin liquids (Level 0)   Temperature : Normal   Respiratory Status: WFL   Supplemental O2: Nasal cannula   No data recorded Behavior/Cognition: Alert; Cooperative; Pleasant mood Self-Feeding Abilities: Able to self-feed Baseline vocal quality/speech: Normal Volitional Cough: Able to elicit Volitional Swallow: Able to elicit No data recorded Goal Planning: Prognosis for improved oropharyngeal function: Guarded Barriers to Reach Goals: Severity of deficits No data recorded No data recorded No data  recorded Pain: No data recorded End of Session: Start Time:SLP Start Time (ACUTE ONLY): 1400 Stop Time: SLP Stop Time (ACUTE ONLY): 1415 Time Calculation:SLP Time Calculation (min) (ACUTE ONLY): 15 min Charges: SLP Evaluations $ SLP Speech Visit: 1 Visit SLP Evaluations $BSS Swallow: 1 Procedure $MBS Swallow: 1 Procedure SLP visit diagnosis: SLP Visit Diagnosis: Dysphagia, unspecified (R13.10) Past Medical History: Past Medical History: Diagnosis Date  Anxiety   Bradycardia   asymptomatic  Breast CA 1993 OR 1994  LEFT, SURGERY AND RADIATION DONE  COPD (chronic obstructive pulmonary disease)   MILD, NO INHALERS USED  DEGENERATIVE JOINT DISEASE, KNEE   Family history of anesthesia complication   NEPHEW NAUSEA/VOMITING  GENU VALGUM   Hyperlipidemia   Hypertension   Numbness of leg 2011  just left shin  OTHER ACQUIRED DEFORMITY OF ANKLE AND FOOT OTHER   Pneumonia 2013  X 2  PVC's (premature ventricular contractions)   ROTATOR CUFF SYNDROME, LEFT   CANNOT LIFT LEFT ARM ALL THE WAY UP  UNEQUAL  LEG LENGTH   UTI (urinary tract infection)   STARTED AUGMENTIN  ON 09-10-13 Past Surgical History: Past Surgical History: Procedure Laterality Date  APPENDECTOMY  AGE 68  BREAST LUMPECTOMY Left 1991  radiation  BREAST SURGERY Left 1993 OR 1994  LUMPECTOMY AND RADIATION DONE  JOINT REPLACEMENT    NASAL SINUS SURGERY  2006  REVERSE SHOULDER ARTHROPLASTY Left 07/31/2015  Procedure: LEFT REVERSE SHOULDER ARTHROPLASTY;  Surgeon: Beverely Low, MD;  Location: [facility] OR;  Service: Orthopedics;  Laterality: Left;  TOTAL HIP ARTHROPLASTY Bilateral LEFT 2008 AND RIGHT 2010  TOTAL KNEE ARTHROPLASTY Left 09/17/2013  Procedure: LEFT TOTAL KNEE ARTHROPLASTY;  Surgeon: Shelda Pal, MD;  Location: [facility] ORS;  Service: Orthopedics;  Laterality: Left; [place]Blois, Riley Nearing 05/10/2022, 2:41 PM  ECHOCARDIOGRAM COMPLETE  Result Date: 05/10/2022    ECHOCARDIOGRAM REPORT   Patient Name:   [name] [name] Date of Exam: 05/10/2022 Medical Rec #:  [id number]         Height:       64.0 in Accession #:    [id number]       Weight:       136.0 lb Date of Birth:  Jan 31, 1930         BSA:          1.661 m Patient Age:    89 years         BP:           116/68 mmHg Patient Gender: F                HR:           58 bpm. Exam Location:  Inpatient Procedure: 2D Echo, Cardiac Doppler and Color Doppler Indications:    Acute respiratory distress  History:        Patient has no prior history of Echocardiogram examinations.                 COPD; Risk Factors:Hypertension and Dyslipidemia.  Sonographer:    Mike Gip Referring Phys: [id number] AMRIT ADHIKARI  Sonographer Comments: Technically difficult study due to poor echo windows. Image acquisition challenging due to breast implants. IMPRESSIONS  1. Left ventricular ejection fraction, by estimation, is 50 to 55%. The left ventricle has low normal function. The left ventricle has no regional wall motion abnormalities. Left ventricular diastolic parameters are consistent with Grade I diastolic dysfunction (impaired relaxation).  2. Right ventricular systolic function is normal. The right ventricular size is mildly enlarged. There is mildly elevated pulmonary artery systolic pressure. The estimated right ventricular systolic pressure is 44.0 mmHg.  3. Right atrial size was mildly dilated.  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 Trivial mitral valve regurgitation. No evidence of mitral stenosis. There is mild late systolic prolapse of both leaflets of the mitral valve.  5. The tricuspid valve is myxomatous. Tricuspid valve regurgitation is mild to moderate.  6. The aortic valve is tricuspid. There is mild calcification of the aortic valve. Aortic valve regurgitation is mild to moderate. Aortic valve sclerosis is present, with no evidence of aortic valve stenosis.  7. The inferior vena cava is dilated in size with >50% respiratory variability, suggesting right atrial pressure of 8 mmHg. FINDINGS  Left Ventricle: Left ventricular ejection fraction,  by estimation, is 50 to 55%. The left ventricle has low normal function. The left ventricle has no regional wall motion abnormalities. The left ventricular internal cavity size was normal in size. There is no left ventricular hypertrophy. Left ventricular diastolic parameters  are consistent with Grade I diastolic dysfunction (impaired relaxation). Normal left ventricular filling pressure. Right Ventricle: The right ventricular size is mildly enlarged. No increase in right ventricular wall thickness. Right ventricular systolic function is normal. There is mildly elevated pulmonary artery systolic pressure. The tricuspid regurgitant velocity is 3.00 m/s, and with an assumed right atrial pressure of 8 mmHg, the estimated right ventricular systolic pressure is 44.0 mmHg. Left Atrium: Left atrial size was normal in size. Right Atrium: Right atrial size was mildly dilated. Pericardium: There is no evidence of pericardial effusion. Mitral Valve: The mitral valve is myxomatous. There is mild late systolic prolapse of both leaflets of the mitral valve. Trivial mitral valve regurgitation. No evidence of mitral valve stenosis. Tricuspid Valve: The tricuspid valve is myxomatous. Tricuspid valve regurgitation is mild to moderate. No evidence of tricuspid stenosis. There is mild prolapse of the tricuspid. Aortic Valve: The aortic valve is tricuspid. There is mild calcification of the aortic valve. Aortic valve regurgitation is mild to moderate. Aortic regurgitation PHT measures 555 msec. Aortic valve sclerosis is present, with no evidence of aortic valve stenosis. Pulmonic Valve: The pulmonic valve was normal in structure. Pulmonic valve regurgitation is mild. No evidence of pulmonic stenosis. Aorta: The aortic root is normal in size and structure. Venous: The inferior vena cava is dilated in size with greater than 50% respiratory variability, suggesting right atrial pressure of 8 mmHg. IAS/Shunts: No atrial level shunt detected  by color flow Doppler.  LEFT VENTRICLE PLAX 2D LVIDd:         4.90 cm     Diastology LVIDs:         3.70 cm     LV e' medial:    4.57 cm/s LV PW:         0.90 cm     LV E/e' medial:  10.4 LV IVS:        1.00 cm     LV e' lateral:   9.14 cm/s LVOT diam:     2.00 cm     LV E/e' lateral: 5.2 LV SV:         47 LV SV Index:   28 LVOT Area:     3.14 cm  LV Volumes (MOD) LV vol d, MOD A2C: 92.6 ml LV vol d, MOD A4C: 89.6 ml LV vol s, MOD A2C: 46.1 ml LV vol s, MOD A4C: 44.5 ml LV SV MOD A2C:     46.5 ml LV SV MOD A4C:     89.6 ml LV SV MOD BP:      48.1 ml RIGHT VENTRICLE             IVC RV Basal diam:  5.00 cm     IVC diam: 2.30 cm RV S prime:     12.60 cm/s TAPSE (M-mode): 1.8 cm LEFT ATRIUM             Index        RIGHT ATRIUM           Index LA diam:        2.00 cm 1.20 cm/m   RA Area:     21.80 cm LA Vol (A2C):   62.2 ml 37.45 ml/m  RA Volume:   80.70 ml  48.59 ml/m LA Vol (A4C):   41.1 ml 24.75 ml/m LA Biplane Vol: 50.5 ml 30.41 ml/m  AORTIC VALVE  PULMONIC VALVE LVOT Vmax:         72.50 cm/s  PR End Diast Vel: 2.46 msec LVOT Vmean:        44.800 cm/s LVOT VTI:          0.149 m AI PHT:            555 msec AR Vena Contracta: 0.40 cm  AORTA Ao Root diam: 3.10 cm Ao Asc diam:  3.40 cm MITRAL VALVE               TRICUSPID VALVE MV Area (PHT): 5.97 cm    TR Peak grad:   36.0 mmHg MV Decel Time: 127 msec    TR Vmax:        300.00 cm/s MV E velocity: 47.60 cm/s MV A velocity: 53.60 cm/s  SHUNTS MV E/A ratio:  0.89        Systemic VTI:  0.15 m                            Systemic Diam: 2.00 cm Thurmon Fair MD Electronically signed by Thurmon Fair MD Signature Date/Time: 05/10/2022/12:28:25 PM    Final    CT Angio Chest PE W and/or Wo Contrast  Result Date: 05/09/2022 CLINICAL DATA:  Rule out pulmonary embolism.  History of pneumonia. EXAM: CT ANGIOGRAPHY CHEST WITH CONTRAST TECHNIQUE: Multidetector CT imaging of the chest was performed using the standard protocol during bolus administration of  intravenous contrast. Multiplanar CT image reconstructions and MIPs were obtained to evaluate the vascular anatomy. RADIATION DOSE REDUCTION: This exam was performed according to the departmental dose-optimization program which includes automated exposure control, adjustment of the mA and/or kV according to patient size and/or use of iterative reconstruction technique. CONTRAST:  75mL OMNIPAQUE IOHEXOL 350 MG/ML SOLN COMPARISON:  10/04/16 FINDINGS: Cardiovascular: Satisfactory opacification of the pulmonary arteries to the segmental level. No evidence of pulmonary embolism. Multi chamber cardiac enlargement. Increase caliber of the main pulmonary artery compatible with PA hypertension. Aortic atherosclerosis and coronary artery calcifications. No pericardial effusion. Mediastinum/Nodes: No enlarged mediastinal, hilar, or axillary lymph nodes. Thyroid gland, trachea, and esophagus demonstrate no significant findings. Lungs/Pleura: There is no pneumothorax identified. No pleural effusion. Signs of chronic interstitial lung disease identified with bilateral peripheral predominant and lower lung zone predominant interstitial reticulation, ground-glass attenuation, architectural distortion and bronchiectasis. Multifocal bilateral areas of consolidation change is identified. This is most severe within the posterolateral right upper lobe where there is a large geographic area of airspace consolidation measuring 8.0 x 3.9 cm, image 32/7. Additional, smaller areas of consolidative change identified within the posterior subpleural right lower lobe and subpleural medial right upper lobe the and periphery of the left upper lobe. Upper Abdomen: No acute abnormality. Musculoskeletal: Multilevel spondylosis identified throughout the thoracic spine. Mild curvature of the thoracic spine is convex towards the right. No acute abnormality. Review of the MIP images confirms the above findings. IMPRESSION: 1. No evidence for acute  pulmonary embolus. 2. Signs of chronic interstitial lung disease with bilateral peripheral predominant and lower lung zone predominant interstitial reticulation, ground-glass attenuation, architectural distortion and bronchiectasis. Consider follow-up imaging with high-resolution CT of the chest when the patient is current, acute clinical situation has resolved. 3. Multifocal bilateral areas of consolidation are identified. This is most severe within the posterolateral right upper lobe where there is a large geographic area of airspace consolidation measuring 8.0 x 3.9 cm. Findings are favored to represent multifocal pneumonia.  Follow-up imaging is recommended to ensure resolution and to rule out underlying malignancy. 4. Multi chamber cardiac enlargement with coronary artery calcifications. 5. Increase caliber of the main pulmonary artery compatible with PA hypertension. 6.  Aortic Atherosclerosis (ICD10-I70.0). Electronically Signed   By: Signa Kell M.D.   On: 05/09/2022 14:31   DG Chest 2 View  Result Date: 05/09/2022 CLINICAL DATA:  Shortness of breath. Personal history of breast cancer. EXAM: CHEST - 2 VIEW COMPARISON:  09/11/2013 FINDINGS: Lungs are hyperexpanded. Diffuse interstitial opacity is progressive in the interval with right greater than left patchy relatively diffuse bilateral airspace disease showing nodular configuration in some regions. No pleural effusion. The cardiopericardial silhouette is within normal limits for size. Status post left shoulder arthroplasty. IMPRESSION: Interval progression of diffuse interstitial opacity with right greater than left patchy relatively diffuse bilateral airspace disease showing nodular configuration in some regions. Findings could reflect multifocal pneumonia although given the history of breast cancer, metastatic disease not excluded. CT chest may prove helpful to further evaluate. Electronically Signed   By: Kennith Center M.D.   On: 05/09/2022 12:32     DISCHARGE EXAMINATION: Vitals:   05/15/22 1947 05/15/22 2247 05/15/22 2300 05/16/22 0539  BP: 99/61 110/69  105/60  Pulse: 89 75  62  Resp: 18   18  Temp: 97.9 F (36.6 C)   98.1 F (36.7 C)  TempSrc: Oral   Oral  SpO2: (!) 84%  95% 99%  Weight:      Height:       General appearance: Awake alert.  In no distress Resp: Clear to auscultation bilaterally.  Normal effort Cardio: S1-S2 is normal regular.  No S3-S4.  No rubs murmurs or bruit GI: Abdomen is soft.  Nontender nondistended.  Bowel sounds are present normal.  No masses organomegaly    DISPOSITION: SNF  Discharge Instructions     Call MD for:  difficulty breathing, headache or visual disturbances   Complete by: As directed    Call MD for:  extreme fatigue   Complete by: As directed    Call MD for:  persistant dizziness or light-headedness   Complete by: As directed    Call MD for:  persistant nausea and vomiting   Complete by: As directed    Call MD for:  severe uncontrolled pain   Complete by: As directed    Call MD for:  temperature >100.4   Complete by: As directed    Diet general   Complete by: As directed    Discharge instructions   Complete by: As directed    Please review instructions on the discharge summary.  You were cared for by a hospitalist during your hospital stay. If you have any questions about your discharge medications or the care you received while you were in the hospital after you are discharged, you can call the unit and asked to speak with the hospitalist on call if the hospitalist that took care of you is not available. Once you are discharged, your primary care physician will handle any further medical issues. Please note that NO REFILLS for any discharge medications will be authorized once you are discharged, as it is imperative that you return to your primary care physician (or establish a relationship with a primary care physician if you do not have one) for your aftercare needs so that  they can reassess your need for medications and monitor your lab values. If you do not have a primary care physician, you can call  [phone] for a physician referral.   Increase activity slowly   Complete by: As directed    No wound care   Complete by: As directed           Allergies as of 05/16/2022       Reactions   Albuterol Other (See Comments)   "Had some rapid heartbeat in 2013 when she took it for some bronchitis"   Diltiazem Other (See Comments)   "Pre-syncope, bp went a bit low for bp on 180 mg dose"   Naproxen Swelling   Other Nausea Only, Other (See Comments)   AN UNNAMED PAIN MEDICATION CAUSED NAUSEA   Cardizem [diltiazem Hcl] Other (See Comments)   Hypotension   Nitrofurantoin Rash, Other (See Comments)   Sept., 2018        Medication List     STOP taking these medications    diphenoxylate-atropine 2.5-0.025 MG tablet Commonly known as: Lomotil   esomeprazole 40 MG capsule Commonly known as: NEXIUM Replaced by: pantoprazole 40 MG tablet   levofloxacin 25 MG/ML solution Commonly known as: LEVAQUIN   lisinopril 5 MG tablet Commonly known as: ZESTRIL   metoprolol succinate 50 MG 24 hr tablet Commonly known as: TOPROL-XL       TAKE these medications    Align Chew Chew 2 tablets by mouth daily.   amoxicillin-clavulanate 875-125 MG tablet Commonly known as: AUGMENTIN Take 1 tablet by mouth every 12 (twelve) hours for 2 days.   atorvastatin 20 MG tablet Commonly known as: LIPITOR Take 20 mg by mouth daily.   Cyanocobalamin 1000 MCG/ML Kit Inject 1,000 mcg into the skin See admin instructions. Inject 1,000 mcg into the skin every 8 weeks What changed: Another medication with the same name was changed. Make sure you understand how and when to take each.   Vitamin B12 1000 MCG Tbcr Take 1,000 mcg by mouth daily. What changed: how much to take   escitalopram 20 MG tablet Commonly known as: LEXAPRO Take 20 mg by mouth daily.   feeding  supplement Liqd Take 237 mLs by mouth 3 (three) times daily between meals.   galantamine 16 MG 24 hr capsule Commonly known as: RAZADYNE ER Take 16 mg by mouth daily with breakfast.   memantine 28 MG Cp24 24 hr capsule Commonly known as: NAMENDA XR Take 1 capsule (28 mg total) by mouth daily.   metoprolol tartrate 25 MG tablet Commonly known as: LOPRESSOR Take 0.5 tablets (12.5 mg total) by mouth 2 (two) times daily.   pantoprazole 40 MG tablet Commonly known as: PROTONIX Take 1 tablet (40 mg total) by mouth daily. Replaces: esomeprazole 40 MG capsule   polyethylene glycol 17 g packet Commonly known as: MIRALAX / GLYCOLAX Take 17 g by mouth daily as needed for mild constipation.               Durable Medical Equipment  (From admission, onward)           Start     Ordered   05/16/22 0650  For home use only DME oxygen  Once       Question Answer Comment  Length of Need Lifetime   Mode or (Route) Nasal cannula   Liters per Minute 2   Frequency Continuous (stationary and portable oxygen unit needed)   Oxygen conserving device Yes   Oxygen delivery system Gas      05/16/22 0649              Contact information for  follow-up providers     Rodrigo Ran, MD. Schedule an appointment as soon as possible for a visit.   Specialty: Internal Medicine Why: post hospitalization follow up Contact information: [address] [place] [address] [phone]              Contact information for after-discharge care     Destination     [facility] .   Service: Skilled Nursing Contact information: [address] [place] [address] [phone]                     TOTAL DISCHARGE TIME: 35 minutes  Zeffie Bickert Rito Ehrlich  [facility] Pager on [email or web address]  05/16/2022, 8:21 AM

## 2022-05-16 NOTE — Progress Notes (Signed)
05/16/22 at 14:49 Call received from Robin from [facility]. She was returning a call for report. Discussed that call was placed x 2 to facility and voicemail message left on second call. Report provided, Zella Ball denied questions, concerns at this time.

## 2022-05-17 ENCOUNTER — Non-Acute Institutional Stay (SKILLED_NURSING_FACILITY): Payer: Medicare HMO | Admitting: Family Medicine

## 2022-05-17 DIAGNOSIS — J449 Chronic obstructive pulmonary disease, unspecified: Secondary | ICD-10-CM

## 2022-05-17 DIAGNOSIS — I10 Essential (primary) hypertension: Secondary | ICD-10-CM

## 2022-05-17 DIAGNOSIS — F341 Dysthymic disorder: Secondary | ICD-10-CM | POA: Diagnosis not present

## 2022-05-17 DIAGNOSIS — J189 Pneumonia, unspecified organism: Secondary | ICD-10-CM | POA: Diagnosis not present

## 2022-05-17 NOTE — Progress Notes (Addendum)
Provider:  Jacalyn Lefevre, MD Location:      Place of Service:     PCP: Rodrigo Ran, MD Patient Care Team: Rodrigo Ran, MD as PCP - General (Internal Medicine) Quintella Reichert, MD as PCP - Cardiology (Cardiology)  Extended Emergency Contact Information Primary Emergency Contact: Ruch,Holly Address: [address]          [place], [place] [address] [place] Phone: [phone] Relation: Niece Secondary Emergency Contact: Ruch,George Address: [address]          [place], [place] [address] [place] Home Phone: [phone] Mobile Phone: [phone] Relation: Nephew  Code Status:  Goals of Care: Advanced Directive information    05/16/2022    3:05 PM  Advanced Directives  Does Patient Have a Medical Advance Directive? No  Would patient like information on creating a medical advance directive? No - Patient declined      No chief complaint on file.   HPI: Patient is a 89 y.o. female seen today for admission to [facility] after a 1 week hospitalization for pneumonia, likely aspiration.  Patient was initially seen by her primary care physician and started on Augmentin for pneumonia 3 to 4 weeks prior to admission.  She did not improve on Augmentin and chest x-ray confirmed pneumonia; she was started on Levaquin.  Again she remains short of breath and weak with exertion.  She was brought to the hospital on the day of admission.  There was no leukocytosis and lactate and procalcitonin levels were negative as was COVID screening.  Respiratory viral panel was unremarkable.  She was started on vancomycin and cefepime.  Modified barium swallow showed significant esophageal dysphagia.  She was seen in consultation by pulmonary for what was then felt to be chronic aspiration.  Antibiotics were changed over to Augmentin. Patient demonstrated desaturation into the 80s with ambulation with physical therapy and recommendation was made for oxygen at  discharge. -Antihypertensives were held due to low blood pressure.  Telemetry showed evidence of occasional supraventricular tachycardia he and beta-blockers were resumed at a lower dose.  There is a history of COPD, hyperlipidemia, depression, dementia, and history of left breast cancer   Past Medical History:  Diagnosis Date   Anxiety    Bradycardia    asymptomatic   Breast CA (HCC) 1993 OR 1994   LEFT, SURGERY AND RADIATION DONE   COPD (chronic obstructive pulmonary disease) (HCC)    MILD, NO INHALERS USED   DEGENERATIVE JOINT DISEASE, KNEE    Family history of anesthesia complication    NEPHEW NAUSEA/VOMITING   GENU VALGUM    Hyperlipidemia    Hypertension    Numbness of leg 2011   just left shin   OTHER ACQUIRED DEFORMITY OF ANKLE AND FOOT OTHER    Pneumonia 2013   X 2   PVC's (premature ventricular contractions)    ROTATOR CUFF SYNDROME, LEFT    CANNOT LIFT LEFT ARM ALL THE WAY UP   UNEQUAL LEG LENGTH    UTI (urinary tract infection)    STARTED AUGMENTIN  ON 09-10-13   Past Surgical History:  Procedure Laterality Date   APPENDECTOMY  AGE 10   BREAST LUMPECTOMY Left 1991   radiation   BREAST SURGERY Left 1993 OR 1994   LUMPECTOMY AND RADIATION DONE   JOINT REPLACEMENT     NASAL SINUS SURGERY  2006   REVERSE SHOULDER ARTHROPLASTY Left 07/31/2015   Procedure: LEFT REVERSE SHOULDER ARTHROPLASTY;  Surgeon:  Beverely Low, MD;  Location: [facility] OR;  Service: Orthopedics;  Laterality: Left;   TOTAL HIP ARTHROPLASTY Bilateral LEFT 2008 AND RIGHT 2010   TOTAL KNEE ARTHROPLASTY Left 09/17/2013   Procedure: LEFT TOTAL KNEE ARTHROPLASTY;  Surgeon: Shelda Pal, MD;  Location: [facility] ORS;  Service: Orthopedics;  Laterality: Left;    reports that she quit smoking about 30 years ago. Her smoking use included cigarettes. She has a 3.75 pack-year smoking history. She has never been exposed to tobacco smoke. She has never used smokeless tobacco. She reports current alcohol use. She reports that  she does not use drugs. Social History   Socioeconomic History   Marital status: Widowed    Spouse name: Not on file   Number of children: Not on file   Years of education: Not on file   Highest education level: Not on file  Occupational History   Not on file  Tobacco Use   Smoking status: Former    Packs/day: 0.25    Years: 15.00    Additional pack years: 0.00    Total pack years: 3.75    Types: Cigarettes    Quit date: 01/18/1992    Years since quitting: 30.3    Passive exposure: Never   Smokeless tobacco: Never  Substance and Sexual Activity   Alcohol use: Yes    Comment: WINE 2 GLASSES PER DAY   Drug use: No   Sexual activity: Not on file  Other Topics Concern   Not on file  Social History Narrative   Not on file   Social Determinants of Health   Financial Resource Strain: Not on file  Food Insecurity: No Food Insecurity (05/10/2022)   Hunger Vital Sign    Worried About Running Out of Food in the Last Year: Never true    Ran Out of Food in the Last Year: Never true  Transportation Needs: No Transportation Needs (05/10/2022)   PRAPARE - Administrator, Civil Service (Medical): No    Lack of Transportation (Non-Medical): No  Physical Activity: Not on file  Stress: Not on file  Social Connections: Not on file  Intimate Partner Violence: Not At Risk (05/10/2022)   Humiliation, Afraid, Rape, and Kick questionnaire    Fear of Current or Ex-Partner: No    Emotionally Abused: No    Physically Abused: No    Sexually Abused: No    Functional Status Survey:    Family History  Problem Relation Age of Onset   Diabetes Mother    Heart Problems Father    Hypertension Brother     Health Maintenance  Topic Date Due   COVID-19 Vaccine (1) Never done   Pneumonia Vaccine 25+ Years old (2 of 2 - PCV) 05/25/2013   INFLUENZA VACCINE  08/18/2022   Medicare Annual Wellness (AWV)  10/13/2022   DTaP/Tdap/Td (5 - Td or Tdap) 05/10/2025   DEXA SCAN  Completed    Zoster Vaccines- Shingrix  Completed   HPV VACCINES  Aged Out    Allergies  Allergen Reactions   Albuterol Other (See Comments)    "Had some rapid heartbeat in 2013 when she took it for some bronchitis"   Diltiazem Other (See Comments)    "Pre-syncope, bp went a bit low for bp on 180 mg dose"     Naproxen Swelling   Other Nausea Only and Other (See Comments)    AN UNNAMED PAIN MEDICATION CAUSED NAUSEA   Cardizem [Diltiazem Hcl] Other (See Comments)  Hypotension   Nitrofurantoin Rash and Other (See Comments)    Sept., 2018      Outpatient Encounter Medications as of 05/17/2022  Medication Sig   amoxicillin-clavulanate (AUGMENTIN) 875-125 MG tablet Take 1 tablet by mouth every 12 (twelve) hours for 2 days.   Cyanocobalamin (VITAMIN B12) 1000 MCG TBCR Take 1,000 mcg by mouth daily.   Cyanocobalamin 1000 MCG/ML KIT Inject 1,000 mcg into the skin See admin instructions. Inject 1,000 mcg into the skin every 8 weeks   escitalopram (LEXAPRO) 20 MG tablet Take 20 mg by mouth daily.   feeding supplement (ENSURE ENLIVE / ENSURE PLUS) LIQD Take 237 mLs by mouth 3 (three) times daily between meals.   galantamine (RAZADYNE ER) 16 MG 24 hr capsule Take 16 mg by mouth daily with breakfast.   memantine (NAMENDA XR) 28 MG CP24 24 hr capsule Take 1 capsule (28 mg total) by mouth daily.   metoprolol tartrate (LOPRESSOR) 25 MG tablet Take 0.5 tablets (12.5 mg total) by mouth 2 (two) times daily.   pantoprazole (PROTONIX) 40 MG tablet Take 1 tablet (40 mg total) by mouth daily.   polyethylene glycol (MIRALAX / GLYCOLAX) 17 g packet Take 17 g by mouth daily as needed for mild constipation.   Probiotic Product (ALIGN) CHEW Chew 2 tablets by mouth daily.   No facility-administered encounter medications on file as of 05/17/2022.    Review of Systems  Constitutional:  Positive for appetite change.  HENT: Negative.    Respiratory:  Positive for cough and shortness of breath.   Cardiovascular:  Negative.   Gastrointestinal: Negative.   Genitourinary: Negative.   Musculoskeletal:  Positive for arthralgias and gait problem.  Hematological: Negative.   Psychiatric/Behavioral: Negative.    All other systems reviewed and are negative.   There were no vitals filed for this visit. There is no height or weight on file to calculate BMI. Physical Exam Vitals and nursing note reviewed.  Constitutional:      Appearance: Normal appearance.  HENT:     Mouth/Throat:     Mouth: Mucous membranes are moist.     Pharynx: Oropharynx is clear.  Eyes:     Extraocular Movements: Extraocular movements intact.     Conjunctiva/sclera: Conjunctivae normal.  Pulmonary:     Effort: Pulmonary effort is normal.     Breath sounds: Rales present.     Comments: Rales on right side Abdominal:     General: Bowel sounds are normal.     Palpations: Abdomen is soft.  Musculoskeletal:     Comments: Currently has a walker for ambulation but prior to hospitalization was not on walker or oxygen per her history  Skin:    Comments: Stage 1 sacral wound  Neurological:     General: No focal deficit present.     Mental Status: She is alert and oriented to person, place, and time.  Psychiatric:        Mood and Affect: Mood normal.        Behavior: Behavior normal.        Thought Content: Thought content normal.     Labs reviewed: Basic Metabolic Panel: Recent Labs    05/10/22 0452 05/13/22 0414 05/15/22 0414  NA 132* 134* 132*  K 3.6 3.8 4.3  CL 99 100 98  CO2 24 27 23   GLUCOSE 93 96 101*  BUN 15 11 12   CREATININE 0.78 0.74 0.78  CALCIUM 8.2* 8.3* 8.7*  PHOS  --   --  2.8  Liver Function Tests: Recent Labs    05/09/22 1205 05/15/22 0414  AST 26  --   ALT 18  --   ALKPHOS 84  --   BILITOT 0.9  --   PROT 7.3  --   ALBUMIN 2.7* 2.6*   No results for input(s): "LIPASE", "AMYLASE" in the last 8760 hours. No results for input(s): "AMMONIA" in the last 8760 hours. CBC: Recent Labs     05/09/22 1525 05/10/22 0452 05/13/22 0414 05/15/22 0414  WBC 10.1 7.9 6.3 8.8  NEUTROABS 7.9*  --   --  5.3  HGB 12.8 10.8* 10.8* 11.2*  HCT 39.8 32.9* 33.5* 34.4*  MCV 99.0 99.1 98.8 97.5  PLT 268 201 198 263   Cardiac Enzymes: No results for input(s): "CKTOTAL", "CKMB", "CKMBINDEX", "TROPONINI" in the last 8760 hours. BNP: Invalid input(s): "POCBNP" No results found for: "HGBA1C" Lab Results  Component Value Date   TSH 2.980 03/17/2021   No results found for: "VITAMINB12" No results found for: "FOLATE" No results found for: "IRON", "TIBC", "FERRITIN"  Imaging and Procedures obtained prior to SNF admission: DG Swallowing Func-Speech Pathology  Result Date: 05/10/2022 Table formatting from the original result was not included. Modified Barium Swallow Study Patient Details Name: CYNIAH GOSSARD MRN: [id number] Date of Birth: [birth year] Today's Date: 05/10/2022 HPI/PMH: HPI: MARTAVIA TYE is a 89 y.o. female with medical history significant of hypertension, hyperlipidemia, breast cancer in remission, anxiety, COPD who was brought by her niece from friends home for follow-up of pneumonia.  Patient was diagnosed with pneumonia by her PCP about 3 to 4 weeks ago and was put on oral antibiotics.  Initially she was started on Augmentin which did not help.  Follow-up chest x-ray showed unimproved pneumonia and she was started on Levaquin.  Currently she is on her fifth day of the course.  Patient has being short of breath, weak mostly when she ambulates . Clinical Impression: Pt demonstrates signs of significant esophageal dysphagia. Oral and oropharyngeal function WNL. After a few sips of liquids and puree pt made and face and reported "its not going down." Observed pt masticate and cracker and exhibit effortful swallow without oral or oropharyngeal impairment, then swept to the esophagus with severe distal residue without observable persitalsis. Liquid wash did not clear stasis. Pill not given.  Pt denies regurgitation but is at risk for postprandial aspiration, particularly if laying in bed.  Possible that this is contributing to recurrent pna. Keep HOB at 30 degrees or higher. Crush medication. Mobilize pt after meals. Use liquid dietary supplements. No benefit of modifying solid texture as pt has severe residue with puree and thin. Continue regular diet and thin liquids. Pt may benefit from f/u with GI depending on pt/family desire to address problem medically. Will f/u for further education with family. Pt is unable to result of test or strategies. Called Niece and left message. Factors that may increase risk of adverse event in presence of aspiration Rubye Oaks & Clearance Coots 2021): Factors that may increase risk of adverse event in presence of aspiration Rubye Oaks & Clearance Coots 2021): Reduced cognitive function; Frail or deconditioned; Aspiration of thick, dense, and/or acidic materials Recommendations/Plan: Swallowing Evaluation Recommendations Swallowing Evaluation Recommendations Recommendations: PO diet PO Diet Recommendation: Regular; Thin liquids (Level 0) Liquid Administration via: Cup; Straw Medication Administration: Whole meds with liquid Supervision: Patient able to self-feed Swallowing strategies  : Slow rate; Small bites/sips; Follow solids with liquids Postural changes: Out of bed for meals (walk a bit after meals) Oral  care recommendations: Oral care BID (2x/day) Recommended consults: Consider GI consultation Treatment Plan Treatment Plan Treatment recommendations: Therapy as outlined in treatment plan below Follow-up recommendations: Home health SLP Functional status assessment: Patient has had a recent decline in their functional status and demonstrates the ability to make significant improvements in function in a reasonable and predictable amount of time. Treatment frequency: Min 2x/week Treatment duration: 1 week Interventions: Aspiration precaution training; Patient/family education;  Compensatory techniques Recommendations Recommendations for follow up therapy are one component of a multi-disciplinary discharge planning process, led by the attending physician.  Recommendations may be updated based on patient status, additional functional criteria and insurance authorization. Assessment: Orofacial Exam: Orofacial Exam Oral Cavity - Dentition: Adequate natural dentition Anatomy: No data recorded Boluses Administered: Boluses Administered Boluses Administered: Thin liquids (Level 0); Mildly thick liquids (Level 2, nectar thick); Moderately thick liquids (Level 3, honey thick); Puree; Solid  Oral Impairment Domain: Oral Impairment Domain Lip Closure: No labial escape Tongue control during bolus hold: Cohesive bolus between tongue to palatal seal Bolus preparation/mastication: Slow prolonged chewing/mashing with complete recollection Bolus transport/lingual motion: Delayed initiation of tongue motion (oral holding) Oral residue: Complete oral clearance Location of oral residue : N/A  Pharyngeal Impairment Domain: Pharyngeal Impairment Domain Soft palate elevation: No bolus between soft palate (SP)/pharyngeal wall (PW) Laryngeal elevation: Complete superior movement of thyroid cartilage with complete approximation of arytenoids to epiglottic petiole Anterior hyoid excursion: Complete anterior movement Epiglottic movement: Complete inversion Laryngeal vestibule closure: Complete, no air/contrast in laryngeal vestibule Pharyngeal stripping wave : Present - complete Pharyngeal contraction (A/P view only): N/A Pharyngoesophageal segment opening: Partial distention/partial duration, partial obstruction of flow Pharyngeal residue: Trace residue within or on pharyngeal structures Location of pharyngeal residue: Pharyngeal wall; Pyriform sinuses  Esophageal Impairment Domain: Esophageal Impairment Domain Esophageal clearance upright position: Esophageal retention Pill: Esophageal Impairment Domain Esophageal  clearance upright position: Esophageal retention Penetration/Aspiration Scale Score: Penetration/Aspiration Scale Score 1.  Material does not enter airway: Thin liquids (Level 0); Mildly thick liquids (Level 2, nectar thick); Moderately thick liquids (Level 3, honey thick); Solid; Puree Compensatory Strategies: Compensatory Strategies Compensatory strategies: Yes Liquid wash: Ineffective   General Information: Caregiver present: No  Diet Prior to this Study: Regular; Thin liquids (Level 0)   Temperature : Normal   Respiratory Status: WFL   Supplemental O2: Nasal cannula   No data recorded Behavior/Cognition: Alert; Cooperative; Pleasant mood Self-Feeding Abilities: Able to self-feed Baseline vocal quality/speech: Normal Volitional Cough: Able to elicit Volitional Swallow: Able to elicit No data recorded Goal Planning: Prognosis for improved oropharyngeal function: Guarded Barriers to Reach Goals: Severity of deficits No data recorded No data recorded No data recorded Pain: No data recorded End of Session: Start Time:SLP Start Time (ACUTE ONLY): 1400 Stop Time: SLP Stop Time (ACUTE ONLY): 1415 Time Calculation:SLP Time Calculation (min) (ACUTE ONLY): 15 min Charges: SLP Evaluations $ SLP Speech Visit: 1 Visit SLP Evaluations $BSS Swallow: 1 Procedure $MBS Swallow: 1 Procedure SLP visit diagnosis: SLP Visit Diagnosis: Dysphagia, unspecified (R13.10) Past Medical History: Past Medical History: Diagnosis Date  Anxiety   Bradycardia   asymptomatic  Breast CA 1993 OR 1994  LEFT, SURGERY AND RADIATION DONE  COPD (chronic obstructive pulmonary disease)   MILD, NO INHALERS USED  DEGENERATIVE JOINT DISEASE, KNEE   Family history of anesthesia complication   NEPHEW NAUSEA/VOMITING  GENU VALGUM   Hyperlipidemia   Hypertension   Numbness of leg 2011  just left shin  OTHER ACQUIRED DEFORMITY OF ANKLE AND FOOT OTHER  Pneumonia 2013  X 2  PVC's (premature ventricular contractions)   ROTATOR CUFF SYNDROME, LEFT   CANNOT LIFT LEFT  ARM ALL THE WAY UP  UNEQUAL LEG LENGTH   UTI (urinary tract infection)   STARTED AUGMENTIN  ON 09-10-13 Past Surgical History: Past Surgical History: Procedure Laterality Date  APPENDECTOMY  AGE 42  BREAST LUMPECTOMY Left 1991  radiation  BREAST SURGERY Left 1993 OR 1994  LUMPECTOMY AND RADIATION DONE  JOINT REPLACEMENT    NASAL SINUS SURGERY  2006  REVERSE SHOULDER ARTHROPLASTY Left 07/31/2015  Procedure: LEFT REVERSE SHOULDER ARTHROPLASTY;  Surgeon: Beverely Low, MD;  Location: [facility] OR;  Service: Orthopedics;  Laterality: Left;  TOTAL HIP ARTHROPLASTY Bilateral LEFT 2008 AND RIGHT 2010  TOTAL KNEE ARTHROPLASTY Left 09/17/2013  Procedure: LEFT TOTAL KNEE ARTHROPLASTY;  Surgeon: Shelda Pal, MD;  Location: [facility];  Service: Orthopedics;  Laterality: Left; DeBlois, Riley Nearing 05/10/2022, 2:41 PM  ECHOCARDIOGRAM COMPLETE  Result Date: 05/10/2022    ECHOCARDIOGRAM REPORT   Patient Name:   NETHRA MEHLBERG Bossi Date of Exam: 05/10/2022 Medical Rec #:  [id number]        Height:       64.0 in Accession #:    [id number]       Weight:       136.0 lb Date of Birth:  [birth year]         BSA:          1.661 m Patient Age:    89 years         BP:           116/68 mmHg Patient Gender: F                HR:           58 bpm. Exam Location:  Inpatient Procedure: 2D Echo, Cardiac Doppler and Color Doppler Indications:    Acute respiratory distress  History:        Patient has no prior history of Echocardiogram examinations.                 COPD; Risk Factors:Hypertension and Dyslipidemia.  Sonographer:    Mike Gip Referring Phys: [id number] AMRIT ADHIKARI  Sonographer Comments: Technically difficult study due to poor echo windows. Image acquisition challenging due to breast implants. IMPRESSIONS  1. Left ventricular ejection fraction, by estimation, is 50 to 55%. The left ventricle has low normal function. The left ventricle has no regional wall motion abnormalities. Left ventricular diastolic parameters are consistent with Grade I  diastolic dysfunction (impaired relaxation).  2. Right ventricular systolic function is normal. The right ventricular size is mildly enlarged. There is mildly elevated pulmonary artery systolic pressure. The estimated right ventricular systolic pressure is 44.0 mmHg.  3. Right atrial size was mildly dilated.  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 Trivial mitral valve regurgitation. No evidence of mitral stenosis. There is mild late systolic prolapse of both leaflets of the mitral valve.  5. The tricuspid valve is myxomatous. Tricuspid valve regurgitation is mild to moderate.  6. The aortic valve is tricuspid. There is mild calcification of the aortic valve. Aortic valve regurgitation is mild to moderate. Aortic valve sclerosis is present, with no evidence of aortic valve stenosis.  7. The inferior vena cava is dilated in size with >50% respiratory variability, suggesting right atrial pressure of 8 mmHg. FINDINGS  Left Ventricle: Left ventricular ejection fraction, by estimation, is 50 to 55%. The left ventricle has low normal function. The  left ventricle has no regional wall motion abnormalities. The left ventricular internal cavity size was normal in size. There is no left ventricular hypertrophy. Left ventricular diastolic parameters are consistent with Grade I diastolic dysfunction (impaired relaxation). Normal left ventricular filling pressure. Right Ventricle: The right ventricular size is mildly enlarged. No increase in right ventricular wall thickness. Right ventricular systolic function is normal. There is mildly elevated pulmonary artery systolic pressure. The tricuspid regurgitant velocity is 3.00 m/s, and with an assumed right atrial pressure of 8 mmHg, the estimated right ventricular systolic pressure is 44.0 mmHg. Left Atrium: Left atrial size was normal in size. Right Atrium: Right atrial size was mildly dilated. Pericardium: There is no evidence of pericardial effusion. Mitral Valve: The mitral valve is  myxomatous. There is mild late systolic prolapse of both leaflets of the mitral valve. Trivial mitral valve regurgitation. No evidence of mitral valve stenosis. Tricuspid Valve: The tricuspid valve is myxomatous. Tricuspid valve regurgitation is mild to moderate. No evidence of tricuspid stenosis. There is mild prolapse of the tricuspid. Aortic Valve: The aortic valve is tricuspid. There is mild calcification of the aortic valve. Aortic valve regurgitation is mild to moderate. Aortic regurgitation PHT measures 555 msec. Aortic valve sclerosis is present, with no evidence of aortic valve stenosis. Pulmonic Valve: The pulmonic valve was normal in structure. Pulmonic valve regurgitation is mild. No evidence of pulmonic stenosis. Aorta: The aortic root is normal in size and structure. Venous: The inferior vena cava is dilated in size with greater than 50% respiratory variability, suggesting right atrial pressure of 8 mmHg. IAS/Shunts: No atrial level shunt detected by color flow Doppler.  LEFT VENTRICLE PLAX 2D LVIDd:         4.90 cm     Diastology LVIDs:         3.70 cm     LV e' medial:    4.57 cm/s LV PW:         0.90 cm     LV E/e' medial:  10.4 LV IVS:        1.00 cm     LV e' lateral:   9.14 cm/s LVOT diam:     2.00 cm     LV E/e' lateral: 5.2 LV SV:         47 LV SV Index:   28 LVOT Area:     3.14 cm  LV Volumes (MOD) LV vol d, MOD A2C: 92.6 ml LV vol d, MOD A4C: 89.6 ml LV vol s, MOD A2C: 46.1 ml LV vol s, MOD A4C: 44.5 ml LV SV MOD A2C:     46.5 ml LV SV MOD A4C:     89.6 ml LV SV MOD BP:      48.1 ml RIGHT VENTRICLE             IVC RV Basal diam:  5.00 cm     IVC diam: 2.30 cm RV S prime:     12.60 cm/s TAPSE (M-mode): 1.8 cm LEFT ATRIUM             Index        RIGHT ATRIUM           Index LA diam:        2.00 cm 1.20 cm/m   RA Area:     21.80 cm LA Vol (A2C):   62.2 ml 37.45 ml/m  RA Volume:   80.70 ml  48.59 ml/m LA Vol (A4C):   41.1 ml 24.75 ml/m  LA Biplane Vol: 50.5 ml 30.41 ml/m  AORTIC VALVE                    PULMONIC VALVE LVOT Vmax:         72.50 cm/s  PR End Diast Vel: 2.46 msec LVOT Vmean:        44.800 cm/s LVOT VTI:          0.149 m AI PHT:            555 msec AR Vena Contracta: 0.40 cm  AORTA Ao Root diam: 3.10 cm Ao Asc diam:  3.40 cm MITRAL VALVE               TRICUSPID VALVE MV Area (PHT): 5.97 cm    TR Peak grad:   36.0 mmHg MV Decel Time: 127 msec    TR Vmax:        300.00 cm/s MV E velocity: 47.60 cm/s MV A velocity: 53.60 cm/s  SHUNTS MV E/A ratio:  0.89        Systemic VTI:  0.15 m                            Systemic Diam: 2.00 cm Thurmon Fair MD Electronically signed by Thurmon Fair MD Signature Date/Time: 05/10/2022/12:28:25 PM    Final    CT Angio Chest PE W and/or Wo Contrast  Result Date: 05/09/2022 CLINICAL DATA:  Rule out pulmonary embolism.  History of pneumonia. EXAM: CT ANGIOGRAPHY CHEST WITH CONTRAST TECHNIQUE: Multidetector CT imaging of the chest was performed using the standard protocol during bolus administration of intravenous contrast. Multiplanar CT image reconstructions and MIPs were obtained to evaluate the vascular anatomy. RADIATION DOSE REDUCTION: This exam was performed according to the departmental dose-optimization program which includes automated exposure control, adjustment of the mA and/or kV according to patient size and/or use of iterative reconstruction technique. CONTRAST:  75mL OMNIPAQUE IOHEXOL 350 MG/ML SOLN COMPARISON:  10/04/16 FINDINGS: Cardiovascular: Satisfactory opacification of the pulmonary arteries to the segmental level. No evidence of pulmonary embolism. Multi chamber cardiac enlargement. Increase caliber of the main pulmonary artery compatible with PA hypertension. Aortic atherosclerosis and coronary artery calcifications. No pericardial effusion. Mediastinum/Nodes: No enlarged mediastinal, hilar, or axillary lymph nodes. Thyroid gland, trachea, and esophagus demonstrate no significant findings. Lungs/Pleura: There is no pneumothorax  identified. No pleural effusion. Signs of chronic interstitial lung disease identified with bilateral peripheral predominant and lower lung zone predominant interstitial reticulation, ground-glass attenuation, architectural distortion and bronchiectasis. Multifocal bilateral areas of consolidation change is identified. This is most severe within the posterolateral right upper lobe where there is a large geographic area of airspace consolidation measuring 8.0 x 3.9 cm, image 32/7. Additional, smaller areas of consolidative change identified within the posterior subpleural right lower lobe and subpleural medial right upper lobe the and periphery of the left upper lobe. Upper Abdomen: No acute abnormality. Musculoskeletal: Multilevel spondylosis identified throughout the thoracic spine. Mild curvature of the thoracic spine is convex towards the right. No acute abnormality. Review of the MIP images confirms the above findings. IMPRESSION: 1. No evidence for acute pulmonary embolus. 2. Signs of chronic interstitial lung disease with bilateral peripheral predominant and lower lung zone predominant interstitial reticulation, ground-glass attenuation, architectural distortion and bronchiectasis. Consider follow-up imaging with high-resolution CT of the chest when the patient is current, acute clinical situation has resolved. 3. Multifocal bilateral areas of consolidation are identified. This is most severe  within the posterolateral right upper lobe where there is a large geographic area of airspace consolidation measuring 8.0 x 3.9 cm. Findings are favored to represent multifocal pneumonia. Follow-up imaging is recommended to ensure resolution and to rule out underlying malignancy. 4. Multi chamber cardiac enlargement with coronary artery calcifications. 5. Increase caliber of the main pulmonary artery compatible with PA hypertension. 6.  Aortic Atherosclerosis (ICD10-I70.0). Electronically Signed   By: Signa Kell M.D.    On: 05/09/2022 14:31   DG Chest 2 View  Result Date: 05/09/2022 CLINICAL DATA:  Shortness of breath. Personal history of breast cancer. EXAM: CHEST - 2 VIEW COMPARISON:  09/11/2013 FINDINGS: Lungs are hyperexpanded. Diffuse interstitial opacity is progressive in the interval with right greater than left patchy relatively diffuse bilateral airspace disease showing nodular configuration in some regions. No pleural effusion. The cardiopericardial silhouette is within normal limits for size. Status post left shoulder arthroplasty. IMPRESSION: Interval progression of diffuse interstitial opacity with right greater than left patchy relatively diffuse bilateral airspace disease showing nodular configuration in some regions. Findings could reflect multifocal pneumonia although given the history of breast cancer, metastatic disease not excluded. CT chest may prove helpful to further evaluate. Electronically Signed   By: Kennith Center M.D.   On: 05/09/2022 12:32    Assessment/Plan 1. Chronic obstructive pulmonary disease, unspecified COPD type (HCC) Currently on oxygen with history of desaturation with exertion  2. Persistent depressive disorder Patient has been on Lexapro 20 mg.  Will continue  3. Primary hypertension Metoprolol reduced to 12.5 mg twice daily  4. Pneumonia of right upper lobe due to infectious organism Continue with Augmentin.  Will repeat chest x-ray in about 1 month and continue with physical therapy for strengthening    Family/ staff Communication:   Labs/tests ordered:  Bertram Millard. Hyacinth Meeker, MD [facility] [address] [place], [place] [address] Office [phone]

## 2022-05-19 LAB — BASIC METABOLIC PANEL
BUN: 11 (ref 4–21)
CO2: 29 — AB (ref 13–22)
Chloride: 101 (ref 99–108)
Creatinine: 0.8 (ref 0.5–1.1)
Glucose: 85
Potassium: 4.1 mEq/L (ref 3.5–5.1)
Sodium: 137 (ref 137–147)

## 2022-05-19 LAB — CBC AND DIFFERENTIAL
HCT: 31 — AB (ref 36–46)
Hemoglobin: 10.1 — AB (ref 12.0–16.0)
Neutrophils Absolute: 4515
Platelets: 228 10*3/uL (ref 150–400)
WBC: 7

## 2022-05-19 LAB — CBC: RBC: 3.23 — AB (ref 3.87–5.11)

## 2022-05-19 LAB — COMPREHENSIVE METABOLIC PANEL: eGFR: 72

## 2022-05-30 ENCOUNTER — Encounter: Payer: Self-pay | Admitting: Nurse Practitioner

## 2022-05-30 ENCOUNTER — Non-Acute Institutional Stay (SKILLED_NURSING_FACILITY): Payer: Medicare HMO | Admitting: Nurse Practitioner

## 2022-05-30 DIAGNOSIS — I10 Essential (primary) hypertension: Secondary | ICD-10-CM | POA: Diagnosis not present

## 2022-05-30 DIAGNOSIS — R600 Localized edema: Secondary | ICD-10-CM | POA: Insufficient documentation

## 2022-05-30 DIAGNOSIS — J449 Chronic obstructive pulmonary disease, unspecified: Secondary | ICD-10-CM

## 2022-05-30 DIAGNOSIS — K5901 Slow transit constipation: Secondary | ICD-10-CM

## 2022-05-30 DIAGNOSIS — F0390 Unspecified dementia without behavioral disturbance: Secondary | ICD-10-CM | POA: Diagnosis not present

## 2022-05-30 DIAGNOSIS — K219 Gastro-esophageal reflux disease without esophagitis: Secondary | ICD-10-CM | POA: Diagnosis not present

## 2022-05-30 NOTE — Assessment & Plan Note (Signed)
on Memantine, Galantamine, obtain MMSE

## 2022-05-30 NOTE — Assessment & Plan Note (Signed)
O2 dependent, Hx of DOE

## 2022-05-30 NOTE — Assessment & Plan Note (Signed)
dc Metoprolol 12.5mg  bid 2/2 low Bp, Bun/creat 11/0.78 05/19/22

## 2022-05-30 NOTE — Assessment & Plan Note (Signed)
Weigh weekly, may consider Furosemide if weight gains 3-5Ibs/wk.

## 2022-05-30 NOTE — Progress Notes (Signed)
Location:   S[facility] Room Number: 51 Place of Service:  SNF (31) Provider: Arna Snipe Emmelia Holdsworth NP  Rodrigo Ran, MD  Patient Care Team: Rodrigo Ran, MD as PCP - General (Internal Medicine) Quintella Reichert, MD as PCP - Cardiology (Cardiology)  Extended Emergency Contact Information Primary Emergency Contact: Ruch,Holly Address: [address]          [place], [place] [address] [place] Phone: [phone] Relation: Niece Secondary Emergency Contact: Ruch,George Address: [address]          [place], [place] [address] [place] Home Phone: [phone] Mobile Phone: [phone] Relation: Nephew  Code Status: DNR Goals of care: Advanced Directive information    05/16/2022    3:05 PM  Advanced Directives  Does Patient Have a Medical Advance Directive? No  Would patient like information on creating a medical advance directive? No - Patient declined     Chief Complaint  Patient presents with   Acute Visit    Reported c/o difficulty urinating, swelling in ankles/feet.     HPI:  Pt is a 89 y.o. female seen today for an acute visit for reported the patient's complaint of difficulty of urination 05/28/22, the patient denied the issue today upon my examination 05/30/22, denied abd pain, lower back pin, urinary frequency or urgency. The patient was noted feet/ankle swelling, no redness or open wound, denied pain or numbness BLE.   Hospitalized 05/09/22-05/16/22 for PNA, treated with Augmentin, repeat CXR one month ordered  Dementia, on Memantine, Galantamine  Constipation, taking prn MiraLax  COPD O2 dependent, Hx of DOE  Depression, on Lexapro  HTN, off Metoprolol 12.5mg  bid 2/2 low Bp, Bun/creat 11/0.78 05/19/22  Breast cancer, in remission  GERD/Esophageal dysphagia, had MBS, on Pantoprazole, Hgb 10.1 05/19/22  Hyponatremia, Na 132 05/15/22<<137 05/19/22     Past Medical History:  Diagnosis Date   Anxiety    Bradycardia    asymptomatic   Breast  CA (HCC) 1993 OR 1994   LEFT, SURGERY AND RADIATION DONE   COPD (chronic obstructive pulmonary disease) (HCC)    MILD, NO INHALERS USED   DEGENERATIVE JOINT DISEASE, KNEE    Family history of anesthesia complication    NEPHEW NAUSEA/VOMITING   GENU VALGUM    Hyperlipidemia    Hypertension    Numbness of leg 2011   just left shin   OTHER ACQUIRED DEFORMITY OF ANKLE AND FOOT OTHER    Pneumonia 2013   X 2   PVC's (premature ventricular contractions)    ROTATOR CUFF SYNDROME, LEFT    CANNOT LIFT LEFT ARM ALL THE WAY UP   UNEQUAL LEG LENGTH    UTI (urinary tract infection)    STARTED AUGMENTIN  ON 09-10-13   Past Surgical History:  Procedure Laterality Date   APPENDECTOMY  AGE 51   BREAST LUMPECTOMY Left 1991   radiation   BREAST SURGERY Left 1993 OR 1994   LUMPECTOMY AND RADIATION DONE   JOINT REPLACEMENT     NASAL SINUS SURGERY  2006   REVERSE SHOULDER ARTHROPLASTY Left 07/31/2015   Procedure: LEFT REVERSE SHOULDER ARTHROPLASTY;  Surgeon: Beverely Low, MD;  Location: [facility];  Service: Orthopedics;  Laterality: Left;   TOTAL HIP ARTHROPLASTY Bilateral LEFT 2008 AND RIGHT 2010   TOTAL KNEE ARTHROPLASTY Left 09/17/2013   Procedure: LEFT TOTAL KNEE ARTHROPLASTY;  Surgeon: Shelda Pal, MD;  Location: [facility];  Service: Orthopedics;  Laterality: Left;    Allergies  Allergen Reactions  Albuterol Other (See Comments)    "Had some rapid heartbeat in 2013 when she took it for some bronchitis"   Diltiazem Other (See Comments)    "Pre-syncope, bp went a bit low for bp on 180 mg dose"     Naproxen Swelling   Other Nausea Only and Other (See Comments)    AN UNNAMED PAIN MEDICATION CAUSED NAUSEA   Cardizem [Diltiazem Hcl] Other (See Comments)    Hypotension   Nitrofurantoin Rash and Other (See Comments)    Sept., 2018      Allergies as of 05/30/2022       Reactions   Albuterol Other (See Comments)   "Had some rapid heartbeat in 2013 when she took it for some bronchitis"    Diltiazem Other (See Comments)   "Pre-syncope, bp went a bit low for bp on 180 mg dose"   Naproxen Swelling   Other Nausea Only, Other (See Comments)   AN UNNAMED PAIN MEDICATION CAUSED NAUSEA   Cardizem [diltiazem Hcl] Other (See Comments)   Hypotension   Nitrofurantoin Rash, Other (See Comments)   Sept., 2018        Medication List        Accurate as of May 30, 2022 11:58 AM. If you have any questions, ask your nurse or doctor.          Align [employer] 2 tablets by mouth daily.   Cyanocobalamin 1000 MCG/ML Kit Inject 1,000 mcg into the skin See admin instructions. Inject 1,000 mcg into the skin every 8 weeks   Vitamin B12 1000 MCG Tbcr Take 1,000 mcg by mouth daily.   escitalopram 20 MG tablet Commonly known as: LEXAPRO Take 20 mg by mouth daily.   feeding supplement Liqd Take 237 mLs by mouth 3 (three) times daily between meals.   galantamine 16 MG 24 hr capsule Commonly known as: RAZADYNE ER Take 16 mg by mouth daily with breakfast.   memantine 28 MG Cp24 24 hr capsule Commonly known as: NAMENDA XR Take 1 capsule (28 mg total) by mouth daily.   metoprolol tartrate 25 MG tablet Commonly known as: LOPRESSOR Take 0.5 tablets (12.5 mg total) by mouth 2 (two) times daily.   pantoprazole 40 MG tablet Commonly known as: PROTONIX Take 1 tablet (40 mg total) by mouth daily.   polyethylene glycol 17 g packet Commonly known as: MIRALAX / GLYCOLAX Take 17 g by mouth daily as needed for mild constipation.        Review of Systems  Constitutional:  Positive for fatigue. Negative for appetite change and fever.  HENT:  Positive for hearing loss and trouble swallowing. Negative for congestion and voice change.   Eyes:  Negative for visual disturbance.  Respiratory:  Positive for cough and shortness of breath. Negative for chest tightness and wheezing.   Cardiovascular:  Positive for leg swelling. Negative for chest pain and palpitations.  Gastrointestinal:   Negative for abdominal pain, constipation, nausea and vomiting.  Genitourinary:  Negative for difficulty urinating, dysuria, frequency and urgency.       Chronic urinary leakage.   Musculoskeletal:  Positive for arthralgias and gait problem.  Neurological:  Negative for speech difficulty, weakness and light-headedness.       Memory lapses.   Psychiatric/Behavioral:  Negative for confusion and sleep disturbance. The patient is not nervous/anxious.     Immunization History  Administered Date(s) Administered   DTaP, 5 pertussis antigens 05/11/2015   Influenza Split 04/27/2009, 08/28/2009, 09/29/2010, 11/08/2011, 10/26/2012, 11/08/2013  Influenza, High Dose Seasonal PF 11/13/2021   Influenza, Quadrivalent, Recombinant, Inj, Pf 10/05/2018, 10/07/2020   Influenza,inj,Quad PF,6+ Mos 10/29/2012, 11/08/2013, 11/22/2014   Influenza-Unspecified 10/03/2015, 10/18/2016   Pneumococcal Polysaccharide-23 08/19/2002, 01/18/2005, 04/27/2009, 08/28/2009   Pneumococcal-Unspecified 05/25/2012   Td (Adult),5 Lf Tetanus Toxid, Preservative Free 08/28/2009   Tdap 04/27/2009, 12/09/2010   Zoster Recombinat (Shingrix) 06/01/2017, 06/05/2017, 08/30/2017   Zoster, Live 08/08/2003, 04/27/2009, 05/28/2016   Pertinent  Health Maintenance Due  Topic Date Due   INFLUENZA VACCINE  08/18/2022   DEXA SCAN  Completed       No data to display         Functional Status Survey:    Vitals:   05/30/22 1131  BP: (!) 100/57  Pulse: 69  Resp: 17  Temp: (!) 97.2 F (36.2 C)  SpO2: 96%   There is no height or weight on file to calculate BMI. Physical Exam Vitals and nursing note reviewed.  Constitutional:      Appearance: Normal appearance.  HENT:     Head: Normocephalic and atraumatic.     Nose: Nose normal.     Mouth/Throat:     Mouth: Mucous membranes are moist.  Eyes:     Extraocular Movements: Extraocular movements intact.     Conjunctiva/sclera: Conjunctivae normal.     Pupils: Pupils are equal,  round, and reactive to light.  Cardiovascular:     Rate and Rhythm: Normal rate and regular rhythm.     Heart sounds: No murmur heard. Pulmonary:     Effort: Pulmonary effort is normal.     Breath sounds: Rales present. No wheezing or rhonchi.     Comments: Right base rales. O2 dependent.  Abdominal:     General: Bowel sounds are normal.     Palpations: Abdomen is soft.     Tenderness: There is no abdominal tenderness. There is no right CVA tenderness, left CVA tenderness, guarding or rebound.  Musculoskeletal:     Cervical back: Normal range of motion and neck supple.     Right lower leg: Edema present.     Left lower leg: Edema present.     Comments: 1+ R+L ankle/foot swelling, dorsalis pedis pulses present.   Skin:    General: Skin is warm and dry.  Neurological:     General: No focal deficit present.     Mental Status: She is alert and oriented to person, place, and time. Mental status is at baseline.     Motor: No weakness.     Coordination: Coordination normal.     Gait: Gait abnormal.  Psychiatric:        Mood and Affect: Mood normal.        Behavior: Behavior normal.        Thought Content: Thought content normal.     Labs reviewed: Recent Labs    05/10/22 0452 05/13/22 0414 05/15/22 0414  NA 132* 134* 132*  K 3.6 3.8 4.3  CL 99 100 98  CO2 24 27 23   GLUCOSE 93 96 101*  BUN 15 11 12   CREATININE 0.78 0.74 0.78  CALCIUM 8.2* 8.3* 8.7*  PHOS  --   --  2.8   Recent Labs    05/09/22 1205 05/15/22 0414  AST 26  --   ALT 18  --   ALKPHOS 84  --   BILITOT 0.9  --   PROT 7.3  --   ALBUMIN 2.7* 2.6*   Recent Labs    05/09/22 1525 05/10/22 8119  05/13/22 0414 05/15/22 0414  WBC 10.1 7.9 6.3 8.8  NEUTROABS 7.9*  --   --  5.3  HGB 12.8 10.8* 10.8* 11.2*  HCT 39.8 32.9* 33.5* 34.4*  MCV 99.0 99.1 98.8 97.5  PLT 268 201 198 263   Lab Results  Component Value Date   TSH 2.980 03/17/2021   No results found for: "HGBA1C" No results found for: "CHOL",  "HDL", "LDLCALC", "LDLDIRECT", "TRIG", "CHOLHDL"  Significant Diagnostic Results in last 30 days:  DG ESOPHAGUS W SINGLE CM (SOL OR THIN BA)  Result Date: 05/11/2022 CLINICAL DATA:  Dysphasia.  Concern for aspiration. EXAM: ESOPHOGRAM/BARIUM SWALLOW TECHNIQUE: Single contrast examination was performed using  thin barium. FLUOROSCOPY: Radiation Exposure Index (as provided by the fluoroscopic device): 3.1 mGy mGy Kerma COMPARISON:  Modified barium swallow from previous day FINDINGS: The oral and oropharyngeal function was normal on the oropharyngeal motility study from the prior day. The patient took sequential sips thin barium in the upright LPO orientation. No signs of aspiration, esophageal stricture or mass. The esophagus empties normally. Mild esophageal dysmotility noted with diminished primary peristalsis. IMPRESSION: 1. No evidence for aspiration, esophageal stricture or mass. Normal emptying of the esophagus in the upright orientation. 2. Mild esophageal dysmotility with diminished primary peristalsis. Electronically Signed   By: Signa Kell M.D.   On: 05/11/2022 13:09   DG Swallowing Func-Speech Pathology  Result Date: 05/10/2022 Table formatting from the original result was not included. Modified Barium Swallow Study Patient Details Name: TEARSA SOCK MRN: [id number] Date of Birth: 10-05-1930 Today's Date: 05/10/2022 HPI/PMH: HPI: TULANI KOUBA is a 89 y.o. female with medical history significant of hypertension, hyperlipidemia, breast cancer in remission, anxiety, COPD who was brought by her niece from friends home for follow-up of pneumonia.  Patient was diagnosed with pneumonia by her PCP about 3 to 4 weeks ago and was put on oral antibiotics.  Initially she was started on Augmentin which did not help.  Follow-up chest x-ray showed unimproved pneumonia and she was started on Levaquin.  Currently she is on her fifth day of the course.  Patient has being short of breath, weak mostly when she  ambulates . Clinical Impression: Pt demonstrates signs of significant esophageal dysphagia. Oral and oropharyngeal function WNL. After a few sips of liquids and puree pt made and face and reported "its not going down." Observed pt masticate and cracker and exhibit effortful swallow without oral or oropharyngeal impairment, then swept to the esophagus with severe distal residue without observable persitalsis. Liquid wash did not clear stasis. Pill not given. Pt denies regurgitation but is at risk for postprandial aspiration, particularly if laying in bed.  Possible that this is contributing to recurrent pna. Keep HOB at 30 degrees or higher. Crush medication. Mobilize pt after meals. Use liquid dietary supplements. No benefit of modifying solid texture as pt has severe residue with puree and thin. Continue regular diet and thin liquids. Pt may benefit from f/u with GI depending on pt/family desire to address problem medically. Will f/u for further education with family. Pt is unable to result of test or strategies. Called Niece and left message. Factors that may increase risk of adverse event in presence of aspiration Rubye Oaks & Clearance Coots 2021): Factors that may increase risk of adverse event in presence of aspiration Rubye Oaks & Clearance Coots 2021): Reduced cognitive function; Frail or deconditioned; Aspiration of thick, dense, and/or acidic materials Recommendations/Plan: Swallowing Evaluation Recommendations Swallowing Evaluation Recommendations Recommendations: PO diet PO Diet Recommendation: Regular; Thin liquids (  Level 0) Liquid Administration via: Cup; Straw Medication Administration: Whole meds with liquid Supervision: Patient able to self-feed Swallowing strategies  : Slow rate; Small bites/sips; Follow solids with liquids Postural changes: Out of bed for meals (walk a bit after meals) Oral care recommendations: Oral care BID (2x/day) Recommended consults: Consider GI consultation Treatment Plan Treatment Plan  Treatment recommendations: Therapy as outlined in treatment plan below Follow-up recommendations: Home health SLP Functional status assessment: Patient has had a recent decline in their functional status and demonstrates the ability to make significant improvements in function in a reasonable and predictable amount of time. Treatment frequency: Min 2x/week Treatment duration: 1 week Interventions: Aspiration precaution training; Patient/family education; Compensatory techniques Recommendations Recommendations for follow up therapy are one component of a multi-disciplinary discharge planning process, led by the attending physician.  Recommendations may be updated based on patient status, additional functional criteria and insurance authorization. Assessment: Orofacial Exam: Orofacial Exam Oral Cavity - Dentition: Adequate natural dentition Anatomy: No data recorded Boluses Administered: Boluses Administered Boluses Administered: Thin liquids (Level 0); Mildly thick liquids (Level 2, nectar thick); Moderately thick liquids (Level 3, honey thick); Puree; Solid  Oral Impairment Domain: Oral Impairment Domain Lip Closure: No labial escape Tongue control during bolus hold: Cohesive bolus between tongue to palatal seal Bolus preparation/mastication: Slow prolonged chewing/mashing with complete recollection Bolus transport/lingual motion: Delayed initiation of tongue motion (oral holding) Oral residue: Complete oral clearance Location of oral residue : N/A  Pharyngeal Impairment Domain: Pharyngeal Impairment Domain Soft palate elevation: No bolus between soft palate (SP)/pharyngeal wall (PW) Laryngeal elevation: Complete superior movement of thyroid cartilage with complete approximation of arytenoids to epiglottic petiole Anterior hyoid excursion: Complete anterior movement Epiglottic movement: Complete inversion Laryngeal vestibule closure: Complete, no air/contrast in laryngeal vestibule Pharyngeal stripping wave :  Present - complete Pharyngeal contraction (A/P view only): N/A Pharyngoesophageal segment opening: Partial distention/partial duration, partial obstruction of flow Pharyngeal residue: Trace residue within or on pharyngeal structures Location of pharyngeal residue: Pharyngeal wall; Pyriform sinuses  Esophageal Impairment Domain: Esophageal Impairment Domain Esophageal clearance upright position: Esophageal retention Pill: Esophageal Impairment Domain Esophageal clearance upright position: Esophageal retention Penetration/Aspiration Scale Score: Penetration/Aspiration Scale Score 1.  Material does not enter airway: Thin liquids (Level 0); Mildly thick liquids (Level 2, nectar thick); Moderately thick liquids (Level 3, honey thick); Solid; Puree Compensatory Strategies: Compensatory Strategies Compensatory strategies: Yes Liquid wash: Ineffective   General Information: Caregiver present: No  Diet Prior to this Study: Regular; Thin liquids (Level 0)   Temperature : Normal   Respiratory Status: WFL   Supplemental O2: Nasal cannula   No data recorded Behavior/Cognition: Alert; Cooperative; Pleasant mood Self-Feeding Abilities: Able to self-feed Baseline vocal quality/speech: Normal Volitional Cough: Able to elicit Volitional Swallow: Able to elicit No data recorded Goal Planning: Prognosis for improved oropharyngeal function: Guarded Barriers to Reach Goals: Severity of deficits No data recorded No data recorded No data recorded Pain: No data recorded End of Session: Start Time:SLP Start Time (ACUTE ONLY): 1400 Stop Time: SLP Stop Time (ACUTE ONLY): 1415 Time Calculation:SLP Time Calculation (min) (ACUTE ONLY): 15 min Charges: SLP Evaluations $ SLP Speech Visit: 1 Visit SLP Evaluations $BSS Swallow: 1 Procedure $MBS Swallow: 1 Procedure SLP visit diagnosis: SLP Visit Diagnosis: Dysphagia, unspecified (R13.10) Past Medical History: Past Medical History: Diagnosis Date  Anxiety   Bradycardia   asymptomatic  Breast CA 1993  OR 1994  LEFT, SURGERY AND RADIATION DONE  COPD (chronic obstructive pulmonary disease)   MILD, NO INHALERS USED  DEGENERATIVE JOINT DISEASE, KNEE   Family history of anesthesia complication   NEPHEW NAUSEA/VOMITING  GENU VALGUM   Hyperlipidemia   Hypertension   Numbness of leg 2011  just left shin  OTHER ACQUIRED DEFORMITY OF ANKLE AND FOOT OTHER   Pneumonia 2013  X 2  PVC's (premature ventricular contractions)   ROTATOR CUFF SYNDROME, LEFT   CANNOT LIFT LEFT ARM ALL THE WAY UP  UNEQUAL LEG LENGTH   UTI (urinary tract infection)   STARTED AUGMENTIN  ON 09-10-13 Past Surgical History: Past Surgical History: Procedure Laterality Date  APPENDECTOMY  AGE 42  BREAST LUMPECTOMY Left 1991  radiation  BREAST SURGERY Left 1993 OR 1994  LUMPECTOMY AND RADIATION DONE  JOINT REPLACEMENT    NASAL SINUS SURGERY  2006  REVERSE SHOULDER ARTHROPLASTY Left 07/31/2015  Procedure: LEFT REVERSE SHOULDER ARTHROPLASTY;  Surgeon: Beverely Low, MD;  Location: [facility];  Service: Orthopedics;  Laterality: Left;  TOTAL HIP ARTHROPLASTY Bilateral LEFT 2008 AND RIGHT 2010  TOTAL KNEE ARTHROPLASTY Left 09/17/2013  Procedure: LEFT TOTAL KNEE ARTHROPLASTY;  Surgeon: Shelda Pal, MD;  Location: [facility];  Service: Orthopedics;  Laterality: Left; [place]Blois, Riley Nearing 05/10/2022, 2:41 PM  ECHOCARDIOGRAM COMPLETE  Result Date: 05/10/2022    ECHOCARDIOGRAM REPORT   Patient Name:   DREANA ORTLIP Trimm Date of Exam: 05/10/2022 Medical Rec #:  [id number]        Height:       64.0 in Accession #:    [id number]       Weight:       136.0 lb Date of Birth:  Dec 11, 1930         BSA:          1.661 m Patient Age:    89 years         BP:           116/68 mmHg Patient Gender: F                HR:           58 bpm. Exam Location:  Inpatient Procedure: 2D Echo, Cardiac Doppler and Color Doppler Indications:    Acute respiratory distress  History:        Patient has no prior history of Echocardiogram examinations.                 COPD; Risk Factors:Hypertension and  Dyslipidemia.  Sonographer:    Mike Gip Referring Phys: [id number] AMRIT ADHIKARI  Sonographer Comments: Technically difficult study due to poor echo windows. Image acquisition challenging due to breast implants. IMPRESSIONS  1. Left ventricular ejection fraction, by estimation, is 50 to 55%. The left ventricle has low normal function. The left ventricle has no regional wall motion abnormalities. Left ventricular diastolic parameters are consistent with Grade I diastolic dysfunction (impaired relaxation).  2. Right ventricular systolic function is normal. The right ventricular size is mildly enlarged. There is mildly elevated pulmonary artery systolic pressure. The estimated right ventricular systolic pressure is 44.0 mmHg.  3. Right atrial size was mildly dilated.  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 Trivial mitral valve regurgitation. No evidence of mitral stenosis. There is mild late systolic prolapse of both leaflets of the mitral valve.  5. The tricuspid valve is myxomatous. Tricuspid valve regurgitation is mild to moderate.  6. The aortic valve is tricuspid. There is mild calcification of the aortic valve. Aortic valve regurgitation is mild to moderate. Aortic valve sclerosis is present, with no evidence of aortic valve  stenosis.  7. The inferior vena cava is dilated in size with >50% respiratory variability, suggesting right atrial pressure of 8 mmHg. FINDINGS  Left Ventricle: Left ventricular ejection fraction, by estimation, is 50 to 55%. The left ventricle has low normal function. The left ventricle has no regional wall motion abnormalities. The left ventricular internal cavity size was normal in size. There is no left ventricular hypertrophy. Left ventricular diastolic parameters are consistent with Grade I diastolic dysfunction (impaired relaxation). Normal left ventricular filling pressure. Right Ventricle: The right ventricular size is mildly enlarged. No increase in right ventricular wall thickness.  Right ventricular systolic function is normal. There is mildly elevated pulmonary artery systolic pressure. The tricuspid regurgitant velocity is 3.00 m/s, and with an assumed right atrial pressure of 8 mmHg, the estimated right ventricular systolic pressure is 44.0 mmHg. Left Atrium: Left atrial size was normal in size. Right Atrium: Right atrial size was mildly dilated. Pericardium: There is no evidence of pericardial effusion. Mitral Valve: The mitral valve is myxomatous. There is mild late systolic prolapse of both leaflets of the mitral valve. Trivial mitral valve regurgitation. No evidence of mitral valve stenosis. Tricuspid Valve: The tricuspid valve is myxomatous. Tricuspid valve regurgitation is mild to moderate. No evidence of tricuspid stenosis. There is mild prolapse of the tricuspid. Aortic Valve: The aortic valve is tricuspid. There is mild calcification of the aortic valve. Aortic valve regurgitation is mild to moderate. Aortic regurgitation PHT measures 555 msec. Aortic valve sclerosis is present, with no evidence of aortic valve stenosis. Pulmonic Valve: The pulmonic valve was normal in structure. Pulmonic valve regurgitation is mild. No evidence of pulmonic stenosis. Aorta: The aortic root is normal in size and structure. Venous: The inferior vena cava is dilated in size with greater than 50% respiratory variability, suggesting right atrial pressure of 8 mmHg. IAS/Shunts: No atrial level shunt detected by color flow Doppler.  LEFT VENTRICLE PLAX 2D LVIDd:         4.90 cm     Diastology LVIDs:         3.70 cm     LV e' medial:    4.57 cm/s LV PW:         0.90 cm     LV E/e' medial:  10.4 LV IVS:        1.00 cm     LV e' lateral:   9.14 cm/s LVOT diam:     2.00 cm     LV E/e' lateral: 5.2 LV SV:         47 LV SV Index:   28 LVOT Area:     3.14 cm  LV Volumes (MOD) LV vol d, MOD A2C: 92.6 ml LV vol d, MOD A4C: 89.6 ml LV vol s, MOD A2C: 46.1 ml LV vol s, MOD A4C: 44.5 ml LV SV MOD A2C:     46.5 ml LV  SV MOD A4C:     89.6 ml LV SV MOD BP:      48.1 ml RIGHT VENTRICLE             IVC RV Basal diam:  5.00 cm     IVC diam: 2.30 cm RV S prime:     12.60 cm/s TAPSE (M-mode): 1.8 cm LEFT ATRIUM             Index        RIGHT ATRIUM           Index LA diam:  2.00 cm 1.20 cm/m   RA Area:     21.80 cm LA Vol (A2C):   62.2 ml 37.45 ml/m  RA Volume:   80.70 ml  48.59 ml/m LA Vol (A4C):   41.1 ml 24.75 ml/m LA Biplane Vol: 50.5 ml 30.41 ml/m  AORTIC VALVE                   PULMONIC VALVE LVOT Vmax:         72.50 cm/s  PR End Diast Vel: 2.46 msec LVOT Vmean:        44.800 cm/s LVOT VTI:          0.149 m AI PHT:            555 msec AR Vena Contracta: 0.40 cm  AORTA Ao Root diam: 3.10 cm Ao Asc diam:  3.40 cm MITRAL VALVE               TRICUSPID VALVE MV Area (PHT): 5.97 cm    TR Peak grad:   36.0 mmHg MV Decel Time: 127 msec    TR Vmax:        300.00 cm/s MV E velocity: 47.60 cm/s MV A velocity: 53.60 cm/s  SHUNTS MV E/A ratio:  0.89        Systemic VTI:  0.15 m                            Systemic Diam: 2.00 cm Rachelle Hora Croitoru MD Electronically signed by Thurmon Fair MD Signature Date/Time: 05/10/2022/12:28:25 PM    Final    CT Angio Chest PE W and/or Wo Contrast  Result Date: 05/09/2022 CLINICAL DATA:  Rule out pulmonary embolism.  History of pneumonia. EXAM: CT ANGIOGRAPHY CHEST WITH CONTRAST TECHNIQUE: Multidetector CT imaging of the chest was performed using the standard protocol during bolus administration of intravenous contrast. Multiplanar CT image reconstructions and MIPs were obtained to evaluate the vascular anatomy. RADIATION DOSE REDUCTION: This exam was performed according to the departmental dose-optimization program which includes automated exposure control, adjustment of the mA and/or kV according to patient size and/or use of iterative reconstruction technique. CONTRAST:  75mL OMNIPAQUE IOHEXOL 350 MG/ML SOLN COMPARISON:  10/04/16 FINDINGS: Cardiovascular: Satisfactory opacification of the  pulmonary arteries to the segmental level. No evidence of pulmonary embolism. Multi chamber cardiac enlargement. Increase caliber of the main pulmonary artery compatible with PA hypertension. Aortic atherosclerosis and coronary artery calcifications. No pericardial effusion. Mediastinum/Nodes: No enlarged mediastinal, hilar, or axillary lymph nodes. Thyroid gland, trachea, and esophagus demonstrate no significant findings. Lungs/Pleura: There is no pneumothorax identified. No pleural effusion. Signs of chronic interstitial lung disease identified with bilateral peripheral predominant and lower lung zone predominant interstitial reticulation, ground-glass attenuation, architectural distortion and bronchiectasis. Multifocal bilateral areas of consolidation change is identified. This is most severe within the posterolateral right upper lobe where there is a large geographic area of airspace consolidation measuring 8.0 x 3.9 cm, image 32/7. Additional, smaller areas of consolidative change identified within the posterior subpleural right lower lobe and subpleural medial right upper lobe the and periphery of the left upper lobe. Upper Abdomen: No acute abnormality. Musculoskeletal: Multilevel spondylosis identified throughout the thoracic spine. Mild curvature of the thoracic spine is convex towards the right. No acute abnormality. Review of the MIP images confirms the above findings. IMPRESSION: 1. No evidence for acute pulmonary embolus. 2. Signs of chronic interstitial lung disease with bilateral peripheral predominant and lower lung  zone predominant interstitial reticulation, ground-glass attenuation, architectural distortion and bronchiectasis. Consider follow-up imaging with high-resolution CT of the chest when the patient is current, acute clinical situation has resolved. 3. Multifocal bilateral areas of consolidation are identified. This is most severe within the posterolateral right upper lobe where there is a  large geographic area of airspace consolidation measuring 8.0 x 3.9 cm. Findings are favored to represent multifocal pneumonia. Follow-up imaging is recommended to ensure resolution and to rule out underlying malignancy. 4. Multi chamber cardiac enlargement with coronary artery calcifications. 5. Increase caliber of the main pulmonary artery compatible with PA hypertension. 6.  Aortic Atherosclerosis (ICD10-I70.0). Electronically Signed   By: Signa Kell M.D.   On: 05/09/2022 14:31   DG Chest 2 View  Result Date: 05/09/2022 CLINICAL DATA:  Shortness of breath. Personal history of breast cancer. EXAM: CHEST - 2 VIEW COMPARISON:  09/11/2013 FINDINGS: Lungs are hyperexpanded. Diffuse interstitial opacity is progressive in the interval with right greater than left patchy relatively diffuse bilateral airspace disease showing nodular configuration in some regions. No pleural effusion. The cardiopericardial silhouette is within normal limits for size. Status post left shoulder arthroplasty. IMPRESSION: Interval progression of diffuse interstitial opacity with right greater than left patchy relatively diffuse bilateral airspace disease showing nodular configuration in some regions. Findings could reflect multifocal pneumonia although given the history of breast cancer, metastatic disease not excluded. CT chest may prove helpful to further evaluate. Electronically Signed   By: Kennith Center M.D.   On: 05/09/2022 12:32    Assessment/Plan: Peripheral edema Weigh weekly, may consider Furosemide if weight gains 3-5Ibs/wk.   Hypertension dc Metoprolol 12.5mg  bid 2/2 low Bp, Bun/creat 11/0.78 05/19/22  Senile dementia (HCC) on Memantine, Galantamine, obtain MMSE  Slow transit constipation Stable, taking prn MiraLax  COPD (chronic obstructive pulmonary disease) (HCC)  O2 dependent, Hx of DOE  Recurrent depression (HCC) Stable, state sleeps well at night,  on Lexapro  GERD (gastroesophageal reflux  disease) GERD/Esophageal dysphagia, had MBS, on Pantoprazole, Hgb 10.1 05/19/22    Family/ staff Communication: plan of care reviewed with the patient and charge nurse.   Labs/tests ordered:  pending f/u CXR 4 weeks.   Time spend 35 minutes.

## 2022-05-30 NOTE — Assessment & Plan Note (Signed)
GERD/Esophageal dysphagia, had MBS, on Pantoprazole, Hgb 10.1 05/19/22

## 2022-05-30 NOTE — Assessment & Plan Note (Signed)
Stable, state sleeps well at night,  on Lexapro

## 2022-05-30 NOTE — Assessment & Plan Note (Signed)
Stable, taking prn MiraLax

## 2022-06-14 ENCOUNTER — Ambulatory Visit (HOSPITAL_COMMUNITY)
Admission: RE | Admit: 2022-06-14 | Discharge: 2022-06-14 | Disposition: A | Discharge status: Home / Self Care | Payer: Medicare HMO | Source: Ambulatory Visit | Attending: Pulmonary Disease | Admitting: Pulmonary Disease

## 2022-06-14 DIAGNOSIS — J440 Chronic obstructive pulmonary disease with acute lower respiratory infection: Secondary | ICD-10-CM | POA: Diagnosis not present

## 2022-06-14 DIAGNOSIS — Z87891 Personal history of nicotine dependence: Secondary | ICD-10-CM | POA: Diagnosis not present

## 2022-06-14 DIAGNOSIS — J479 Bronchiectasis, uncomplicated: Secondary | ICD-10-CM | POA: Diagnosis not present

## 2022-06-14 DIAGNOSIS — I700 Atherosclerosis of aorta: Secondary | ICD-10-CM | POA: Insufficient documentation

## 2022-06-14 DIAGNOSIS — J439 Emphysema, unspecified: Secondary | ICD-10-CM | POA: Diagnosis not present

## 2022-06-14 DIAGNOSIS — J181 Lobar pneumonia, unspecified organism: Secondary | ICD-10-CM | POA: Insufficient documentation

## 2022-06-14 DIAGNOSIS — J849 Interstitial pulmonary disease, unspecified: Secondary | ICD-10-CM | POA: Diagnosis not present

## 2022-06-15 ENCOUNTER — Non-Acute Institutional Stay (SKILLED_NURSING_FACILITY): Payer: Medicare HMO | Admitting: Nurse Practitioner

## 2022-06-15 ENCOUNTER — Encounter: Payer: Self-pay | Admitting: Nurse Practitioner

## 2022-06-15 DIAGNOSIS — J449 Chronic obstructive pulmonary disease, unspecified: Secondary | ICD-10-CM

## 2022-06-15 DIAGNOSIS — I10 Essential (primary) hypertension: Secondary | ICD-10-CM

## 2022-06-15 DIAGNOSIS — R600 Localized edema: Secondary | ICD-10-CM | POA: Diagnosis not present

## 2022-06-15 DIAGNOSIS — K5901 Slow transit constipation: Secondary | ICD-10-CM

## 2022-06-15 DIAGNOSIS — F0390 Unspecified dementia without behavioral disturbance: Secondary | ICD-10-CM | POA: Diagnosis not present

## 2022-06-15 DIAGNOSIS — F339 Major depressive disorder, recurrent, unspecified: Secondary | ICD-10-CM

## 2022-06-15 NOTE — Progress Notes (Signed)
Location:   SNF [facility] Room Number: 60A Place of Service:  SNF (31) Provider: Arna Snipe Verna Desrocher NP  Frederica Kuster, MD  Patient Care Team: Frederica Kuster, MD as PCP - General (Family Medicine) Quintella Reichert, MD as PCP - Cardiology (Cardiology)  Extended Emergency Contact Information Primary Emergency Contact: Ruch,Holly Address: [address]          [place], [place] [address] [place] Phone: [phone] Relation: Niece Secondary Emergency Contact: Ruch,George Address: [address]          [place], [place] [address] [place] Home Phone: [phone] Mobile Phone: [phone] Relation: Nephew  Code Status:  DNR Goals of care: Advanced Directive information    05/16/2022    3:05 PM  Advanced Directives  Does Patient Have a Medical Advance Directive? No  Would patient like information on creating a medical advance directive? No - Patient declined     Chief Complaint  Patient presents with   Medical Management of Chronic Issues    HPI:  Pt is a 89 y.o. female seen today for medical management of chronic diseases.     Hospitalized 05/09/22-05/16/22 for PNA, treated with Augmentin, repeat CXR one month ordered             Dementia, on Memantine, Galantamine, 05/31/22 MMSE 20/30             Constipation, taking prn MiraLax             COPD O2 dependent, Hx of DOE             Depression, on Lexapro             HTN, off Metoprolol 12.5mg  bid 2/2 low Bp, Bun/creat 11/0.78 05/19/22  Edema minimal, no weight gains.              Breast cancer, in remission             GERD/Esophageal dysphagia, had MBS, on Pantoprazole, Hgb 10.1 05/19/22             Hyponatremia, Na 132 05/15/22<<137 05/19/22              Past Medical History:  Diagnosis Date   Anxiety    Bradycardia    asymptomatic   Breast CA (HCC) 1993 OR 1994   LEFT, SURGERY AND RADIATION DONE   COPD (chronic obstructive pulmonary disease) (HCC)    MILD, NO INHALERS USED    DEGENERATIVE JOINT DISEASE, KNEE    Family history of anesthesia complication    NEPHEW NAUSEA/VOMITING   GENU VALGUM    Hyperlipidemia    Hypertension    Numbness of leg 2011   just left shin   OTHER ACQUIRED DEFORMITY OF ANKLE AND FOOT OTHER    Pneumonia 2013   X 2   PVC's (premature ventricular contractions)    ROTATOR CUFF SYNDROME, LEFT    CANNOT LIFT LEFT ARM ALL THE WAY UP   UNEQUAL LEG LENGTH    UTI (urinary tract infection)    STARTED AUGMENTIN  ON 09-10-13   Past Surgical History:  Procedure Laterality Date   APPENDECTOMY  AGE 37   BREAST LUMPECTOMY Left 1991   radiation   BREAST SURGERY Left 1993 OR 1994   LUMPECTOMY AND RADIATION DONE   JOINT REPLACEMENT     NASAL SINUS SURGERY  2006   REVERSE SHOULDER ARTHROPLASTY Left 07/31/2015   Procedure: LEFT REVERSE SHOULDER ARTHROPLASTY;  Surgeon: Brett Canales  Ranell Patrick, MD;  Location: [facility] OR;  Service: Orthopedics;  Laterality: Left;   TOTAL HIP ARTHROPLASTY Bilateral LEFT 2008 AND RIGHT 2010   TOTAL KNEE ARTHROPLASTY Left 09/17/2013   Procedure: LEFT TOTAL KNEE ARTHROPLASTY;  Surgeon: Shelda Pal, MD;  Location: [facility];  Service: Orthopedics;  Laterality: Left;    Allergies  Allergen Reactions   Albuterol Other (See Comments)    "Had some rapid heartbeat in 2013 when she took it for some bronchitis"   Diltiazem Other (See Comments)    "Pre-syncope, bp went a bit low for bp on 180 mg dose"     Naproxen Swelling   Other Nausea Only and Other (See Comments)    AN UNNAMED PAIN MEDICATION CAUSED NAUSEA   Cardizem [Diltiazem Hcl] Other (See Comments)    Hypotension   Nitrofurantoin Rash and Other (See Comments)    Sept., 2018      Allergies as of 06/15/2022       Reactions   Albuterol Other (See Comments)   "Had some rapid heartbeat in 2013 when she took it for some bronchitis"   Diltiazem Other (See Comments)   "Pre-syncope, bp went a bit low for bp on 180 mg dose"   Naproxen Swelling   Other Nausea Only, Other (See  Comments)   AN UNNAMED PAIN MEDICATION CAUSED NAUSEA   Cardizem [diltiazem Hcl] Other (See Comments)   Hypotension   Nitrofurantoin Rash, Other (See Comments)   Sept., 2018        Medication List        Accurate as of Jun 15, 2022  4:12 PM. If you have any questions, ask your nurse or doctor.          Align [employer] 2 tablets by mouth daily.   Cyanocobalamin 1000 MCG/ML Kit Inject 1,000 mcg into the skin See admin instructions. Inject 1,000 mcg into the skin every 8 weeks   Vitamin B12 1000 MCG Tbcr Take 1,000 mcg by mouth daily.   escitalopram 20 MG tablet Commonly known as: LEXAPRO Take 20 mg by mouth daily.   feeding supplement Liqd Take 237 mLs by mouth 3 (three) times daily between meals.   galantamine 16 MG 24 hr capsule Commonly known as: RAZADYNE ER Take 16 mg by mouth daily with breakfast.   memantine 28 MG Cp24 24 hr capsule Commonly known as: NAMENDA XR Take 1 capsule (28 mg total) by mouth daily.   metoprolol tartrate 25 MG tablet Commonly known as: LOPRESSOR Take 0.5 tablets (12.5 mg total) by mouth 2 (two) times daily.   pantoprazole 40 MG tablet Commonly known as: PROTONIX Take 1 tablet (40 mg total) by mouth daily.   polyethylene glycol 17 g packet Commonly known as: MIRALAX / GLYCOLAX Take 17 g by mouth daily as needed for mild constipation.        Review of Systems  Constitutional:  Negative for appetite change, fatigue and fever.  HENT:  Positive for hearing loss and trouble swallowing. Negative for congestion and voice change.   Eyes:  Negative for visual disturbance.  Respiratory:  Positive for cough and shortness of breath. Negative for chest tightness and wheezing.   Cardiovascular:  Positive for leg swelling. Negative for chest pain and palpitations.  Gastrointestinal:  Negative for abdominal pain, constipation, nausea and vomiting.  Genitourinary:  Negative for difficulty urinating, dysuria, frequency and urgency.        Chronic urinary leakage.   Musculoskeletal:  Positive for arthralgias and gait problem.  Neurological:  Negative for speech difficulty, weakness and light-headedness.       Memory lapses.   Psychiatric/Behavioral:  Negative for confusion and sleep disturbance. The patient is not nervous/anxious.     Immunization History  Administered Date(s) Administered   DTaP, 5 pertussis antigens 05/11/2015   Influenza Split 04/27/2009, 08/28/2009, 09/29/2010, 11/08/2011, 10/26/2012, 11/08/2013   Influenza, High Dose Seasonal PF 11/13/2021   Influenza, Quadrivalent, Recombinant, Inj, Pf 10/05/2018, 10/07/2020   Influenza,inj,Quad PF,6+ Mos 10/29/2012, 11/08/2013, 11/22/2014   Influenza-Unspecified 10/03/2015, 10/18/2016   Pneumococcal Polysaccharide-23 08/19/2002, 01/18/2005, 04/27/2009, 08/28/2009   Pneumococcal-Unspecified 05/25/2012   Td (Adult),5 Lf Tetanus Toxid, Preservative Free 08/28/2009   Tdap 04/27/2009, 12/09/2010   Zoster Recombinat (Shingrix) 06/01/2017, 06/05/2017, 08/30/2017   Zoster, Live 08/08/2003, 04/27/2009, 05/28/2016   Pertinent  Health Maintenance Due  Topic Date Due   INFLUENZA VACCINE  08/18/2022   DEXA SCAN  Completed       No data to display         Functional Status Survey:    Vitals:   06/15/22 1231  BP: 110/64  Pulse: 73  Resp: 18  Temp: 98.9 F (37.2 C)  SpO2: 99%   There is no height or weight on file to calculate BMI. Physical Exam Vitals and nursing note reviewed.  Constitutional:      Appearance: Normal appearance.  HENT:     Head: Normocephalic and atraumatic.     Nose: Nose normal.     Mouth/Throat:     Mouth: Mucous membranes are moist.  Eyes:     Extraocular Movements: Extraocular movements intact.     Conjunctiva/sclera: Conjunctivae normal.     Pupils: Pupils are equal, round, and reactive to light.  Cardiovascular:     Rate and Rhythm: Normal rate and regular rhythm.     Heart sounds: No murmur heard. Pulmonary:     Effort:  Pulmonary effort is normal.     Breath sounds: Rales present. No wheezing or rhonchi.     Comments: Bibasilar rales. O2 dependent.  Abdominal:     General: Bowel sounds are normal.     Palpations: Abdomen is soft.     Tenderness: There is no abdominal tenderness. There is no right CVA tenderness, left CVA tenderness, guarding or rebound.  Musculoskeletal:     Cervical back: Normal range of motion and neck supple.     Right lower leg: Edema present.     Left lower leg: Edema present.     Comments: 1+ R+L ankle/foot swelling, dorsalis pedis pulses present.   Skin:    General: Skin is warm and dry.  Neurological:     General: No focal deficit present.     Mental Status: She is alert and oriented to person, place, and time. Mental status is at baseline.     Motor: No weakness.     Coordination: Coordination normal.     Gait: Gait abnormal.  Psychiatric:        Mood and Affect: Mood normal.        Behavior: Behavior normal.        Thought Content: Thought content normal.     Labs reviewed: Recent Labs    05/10/22 0452 05/13/22 0414 05/15/22 0414  NA 132* 134* 132*  K 3.6 3.8 4.3  CL 99 100 98  CO2 24 27 23   GLUCOSE 93 96 101*  BUN 15 11 12   CREATININE 0.78 0.74 0.78  CALCIUM 8.2* 8.3* 8.7*  PHOS  --   --  2.8   Recent Labs    05/09/22 1205 05/15/22 0414  AST 26  --   ALT 18  --   ALKPHOS 84  --   BILITOT 0.9  --   PROT 7.3  --   ALBUMIN 2.7* 2.6*   Recent Labs    05/09/22 1525 05/10/22 0452 05/13/22 0414 05/15/22 0414  WBC 10.1 7.9 6.3 8.8  NEUTROABS 7.9*  --   --  5.3  HGB 12.8 10.8* 10.8* 11.2*  HCT 39.8 32.9* 33.5* 34.4*  MCV 99.0 99.1 98.8 97.5  PLT 268 201 198 263   Lab Results  Component Value Date   TSH 2.980 03/17/2021   No results found for: "HGBA1C" No results found for: "CHOL", "HDL", "LDLCALC", "LDLDIRECT", "TRIG", "CHOLHDL"  Significant Diagnostic Results in last 30 days:  No results found.  Assessment/Plan  Senile dementia  (HCC) on Memantine, Galantamine, 05/31/22 MMSE 20/30  Slow transit constipation Stable, taking prn MiraLax  COPD (chronic obstructive pulmonary disease) (HCC) Stable, O2 dependent, Hx of DOE  Recurrent depression (HCC) Stable, on Lexapro  Hypertension  off Metoprolol 12.5mg  bid 2/2 low Bp, Bun/creat 11/0.78 05/19/22  Peripheral edema minimal, no weight gains.   GERD (gastroesophageal reflux disease)   GERD/Esophageal dysphagia, had MBS, on Pantoprazole, Hgb 10.1 05/19/22   Family/ staff Communication: plan of care reviewed with the patient and charge nurse.   Labs/tests ordered:  none  Time spend 35 minutes.

## 2022-06-15 NOTE — Assessment & Plan Note (Signed)
Stable, taking prn MiraLax 

## 2022-06-15 NOTE — Assessment & Plan Note (Signed)
off Metoprolol 12.5mg  bid 2/2 low Bp, Bun/creat 11/0.78 05/19/22

## 2022-06-15 NOTE — Assessment & Plan Note (Signed)
on Memantine, Galantamine, 05/31/22 MMSE 20/30

## 2022-06-15 NOTE — Assessment & Plan Note (Signed)
minimal, no weight gains.

## 2022-06-15 NOTE — Assessment & Plan Note (Signed)
GERD/Esophageal dysphagia, had MBS, on Pantoprazole, Hgb 10.1 05/19/22 

## 2022-06-15 NOTE — Assessment & Plan Note (Signed)
Stable, O2 dependent, Hx of DOE

## 2022-06-15 NOTE — Assessment & Plan Note (Signed)
Stable, on Lexapro

## 2022-06-16 ENCOUNTER — Encounter: Payer: Self-pay | Admitting: Pulmonary Disease

## 2022-06-16 ENCOUNTER — Ambulatory Visit (INDEPENDENT_AMBULATORY_CARE_PROVIDER_SITE_OTHER): Payer: Medicare HMO | Admitting: Pulmonary Disease

## 2022-06-16 VITALS — BP 120/64 | HR 75 | Ht 64.0 in | Wt 117.6 lb

## 2022-06-16 DIAGNOSIS — J181 Lobar pneumonia, unspecified organism: Secondary | ICD-10-CM | POA: Diagnosis not present

## 2022-06-16 NOTE — Patient Instructions (Signed)
It is nice to see you again  I am glad you are recovering well.  No changes to medication  We will walk around the clinic today with a portable oxygen concentrator to see if you need oxygen and if so if you qualify for portable oxygen concentrator.  If so we can send new orders in.  Return to clinic in 2 months or sooner as needed with Dr. Judeth Horn

## 2022-06-17 NOTE — Progress Notes (Signed)
@Patient  ID: Tia Alert, female    DOB: [birth year], 89 y.o.   MRN: [id number]  Chief Complaint  Patient presents with   Hospitalization Follow-up    Pt states she has been doing good after recent hospital stay. Was discharged home from the hospital on oxygen.    Referring provider: Rodrigo Ran, MD  HPI:   89 y.o. woman whom we are seeing in hospital follow up after likely aspiration pneumonia and hypoxemic respiratory failure.  Discharge summary reviewed.  Overall seems to be doing well.  Slowly regaining strength.  Taking a long time but getting there.  Still requiring oxygen.  A couple liters.  Working with physical therapy.  She is unsure if they are turning it down or up.  She has not walked around or tried to come off the oxygen.  We discussed walk around the clinic today which we did, she did desaturate but did require or was able to maintain oxygen saturations on the POC device.  She and caregiver feel like this to be very beneficial in terms of increasing her mobility over time which I agree.  We reviewed her CT scan that shows great improvement improved aeration in the right upper lobe area of pneumonia.  This is expected.  We discussed that there is some concern for aspiration on the speech-language pathology evaluation but the esophagram overall looked okay.    Questionaires / Pulmonary Flowsheets:   ACT:      No data to display          MMRC:     No data to display          Epworth:      No data to display          Tests:   FENO:  No results found for: "NITRICOXIDE"  PFT:    Latest Ref Rng & Units 01/31/2017   12:44 PM  PFT Results  FVC-Pre L 1.84   FVC-Predicted Pre % 73   FVC-Post L 2.08   FVC-Predicted Post % 82   Pre FEV1/FVC % % 70   Post FEV1/FCV % % 73   FEV1-Pre L 1.30   FEV1-Predicted Pre % 69   FEV1-Post L 1.52   DLCO uncorrected ml/min/mmHg 11.91   DLCO UNC% % 46   DLCO corrected ml/min/mmHg 12.53   DLCO COR %Predicted %  48   DLVA Predicted % 78   TLC L 4.37   TLC % Predicted % 84   RV % Predicted % 96     WALK:     06/16/2022    2:30 PM 06/16/2022    2:20 PM  SIX MIN WALK  Supplimental Oxygen during Test? (L/min) Yes No  O2 Flow Rate 3 L/min   Type Pulse   Tech Comments: pt's sats maintained stable on 3L pulse. pt stopped and placed on oxygen    Imaging: Personally reviewed and as per EMR discussion this note No results found.  Lab Results: Personally reviewed CBC    Component Value Date/Time   WBC 8.8 05/15/2022 0414   RBC 3.53 (L) 05/15/2022 0414   HGB 11.2 (L) 05/15/2022 0414   HGB 13.3 03/17/2021 0843   HCT 34.4 (L) 05/15/2022 0414   HCT 40.0 03/17/2021 0843   PLT 263 05/15/2022 0414   PLT 238 03/17/2021 0843   MCV 97.5 05/15/2022 0414   MCV 96 03/17/2021 0843   MCH 31.7 05/15/2022 0414   MCHC 32.6 05/15/2022 0414   RDW 13.8  05/15/2022 0414   RDW 12.8 03/17/2021 0843   LYMPHSABS 1.9 05/15/2022 0414   MONOABS 0.8 05/15/2022 0414   EOSABS 0.7 (H) 05/15/2022 0414   BASOSABS 0.1 05/15/2022 0414    BMET    Component Value Date/Time   NA 132 (L) 05/15/2022 0414   NA 137 03/17/2021 0843   K 4.3 05/15/2022 0414   CL 98 05/15/2022 0414   CO2 23 05/15/2022 0414   GLUCOSE 101 (H) 05/15/2022 0414   BUN 12 05/15/2022 0414   BUN 15 03/17/2021 0843   CREATININE 0.78 05/15/2022 0414   CREATININE 0.87 12/25/2015 1059   CALCIUM 8.7 (L) 05/15/2022 0414   GFRNONAA >60 05/15/2022 0414   GFRAA 56 (L) 03/03/2020 0936    BNP    Component Value Date/Time   BNP 218.0 (H) 05/09/2022 1205    ProBNP No results found for: "PROBNP"  Specialty Problems       Pulmonary Problems   COPD (chronic obstructive pulmonary disease) (HCC)    06/16/22 Pulmonology, 6 min walk if O2 can be decreased or dc      PNA (pneumonia)    Allergies  Allergen Reactions   Albuterol Other (See Comments)    "Had some rapid heartbeat in 2013 when she took it for some bronchitis"   Diltiazem Other (See  Comments)    "Pre-syncope, bp went a bit low for bp on 180 mg dose"     Naproxen Swelling   Other Nausea Only and Other (See Comments)    AN UNNAMED PAIN MEDICATION CAUSED NAUSEA   Cardizem [Diltiazem Hcl] Other (See Comments)    Hypotension   Nitrofurantoin Rash and Other (See Comments)    Sept., 2018      Immunization History  Administered Date(s) Administered   DTaP, 5 pertussis antigens 05/11/2015   Influenza Split 04/27/2009, 08/28/2009, 09/29/2010, 11/08/2011, 10/26/2012, 11/08/2013   Influenza, High Dose Seasonal PF 11/13/2021   Influenza, Quadrivalent, Recombinant, Inj, Pf 10/05/2018, 10/07/2020   Influenza,inj,Quad PF,6+ Mos 10/29/2012, 11/08/2013, 11/22/2014   Influenza-Unspecified 10/03/2015, 10/18/2016   Pneumococcal Polysaccharide-23 08/19/2002, 01/18/2005, 04/27/2009, 08/28/2009   Pneumococcal-Unspecified 05/25/2012   Td (Adult),5 Lf Tetanus Toxid, Preservative Free 08/28/2009   Tdap 04/27/2009, 12/09/2010   Zoster Recombinat (Shingrix) 06/01/2017, 06/05/2017, 08/30/2017   Zoster, Live 08/08/2003, 04/27/2009, 05/28/2016    Past Medical History:  Diagnosis Date   Anxiety    Bradycardia    asymptomatic   Breast CA (HCC) 1993 OR 1994   LEFT, SURGERY AND RADIATION DONE   COPD (chronic obstructive pulmonary disease) (HCC)    MILD, NO INHALERS USED   DEGENERATIVE JOINT DISEASE, KNEE    Family history of anesthesia complication    NEPHEW NAUSEA/VOMITING   GENU VALGUM    Hyperlipidemia    Hypertension    Numbness of leg 2011   just left shin   OTHER ACQUIRED DEFORMITY OF ANKLE AND FOOT OTHER    Pneumonia 2013   X 2   PVC's (premature ventricular contractions)    ROTATOR CUFF SYNDROME, LEFT    CANNOT LIFT LEFT ARM ALL THE WAY UP   UNEQUAL LEG LENGTH    UTI (urinary tract infection)    STARTED AUGMENTIN  ON 09-10-13    Tobacco History: Social History   Tobacco Use  Smoking Status Former   Packs/day: 0.25   Years: 15.00   Additional pack years: 0.00    Total pack years: 3.75   Types: Cigarettes   Quit date: 01/18/1992   Years since quitting: 30.4  Passive exposure: Never  Smokeless Tobacco Never   Counseling given: Not Answered   Continue to not smoke  Outpatient Encounter Medications as of 06/16/2022  Medication Sig   atorvastatin (LIPITOR) 20 MG tablet Take 20 mg by mouth daily.   Cyanocobalamin (VITAMIN B 12) 500 MCG TABS Take 1,000 mg by mouth daily.   Cyanocobalamin 1000 MCG/ML KIT Inject 1,000 mcg into the skin See admin instructions. Inject 1,000 mcg into the skin every 8 weeks   escitalopram (LEXAPRO) 20 MG tablet Take 20 mg by mouth daily.   feeding supplement (ENSURE ENLIVE / ENSURE PLUS) LIQD Take 237 mLs by mouth 3 (three) times daily between meals.   galantamine (RAZADYNE ER) 16 MG 24 hr capsule Take 16 mg by mouth daily with breakfast.   memantine (NAMENDA XR) 28 MG CP24 24 hr capsule Take 1 capsule (28 mg total) by mouth daily.   metoprolol tartrate (LOPRESSOR) 25 MG tablet Take 0.5 tablets (12.5 mg total) by mouth 2 (two) times daily.   OXYGEN Inhale 2 L into the lungs daily.   pantoprazole (PROTONIX) 40 MG tablet Take 1 tablet (40 mg total) by mouth daily.   polyethylene glycol (MIRALAX / GLYCOLAX) 17 g packet Take 17 g by mouth daily as needed for mild constipation.   Probiotic Product (ALIGN) CHEW Chew 2 tablets by mouth daily.   [DISCONTINUED] Cyanocobalamin (VITAMIN B12) 1000 MCG TBCR Take 1,000 mcg by mouth daily.   No facility-administered encounter medications on file as of 06/16/2022.     Review of Systems  Review of Systems  No chest pain with exertion.  No orthopnea or PND.  Comprehensive review of systems otherwise negative. Physical Exam  BP 120/64 (BP Location: Left Arm, Patient Position: Sitting, Cuff Size: Normal)   Pulse 75   Ht 5\' 4"  (1.626 m)   Wt 117 lb 9.6 oz (53.3 kg)   SpO2 98% Comment: 2L cont  BMI 20.19 kg/m   Wt Readings from Last 5 Encounters:  06/16/22 117 lb 9.6 oz (53.3  kg)  05/16/22 136 lb 4 oz (61.8 kg)  05/09/22 136 lb 0.4 oz (61.7 kg)  12/20/21 136 lb (61.7 kg)  03/17/21 141 lb 12.8 oz (64.3 kg)    BMI Readings from Last 5 Encounters:  06/16/22 20.19 kg/m  05/16/22 23.39 kg/m  05/09/22 23.35 kg/m  12/20/21 23.34 kg/m  03/17/21 23.96 kg/m     Physical Exam General: Sitting in chair, in no acute distress Pulmonary: Clear, no work of breathing Cardiovascular: Warm no edema   Assessment & Plan:   Hypoxemic respiratory failure: Worsened after recent admission for pneumonia.  With chronic fibrotic like changes likely related to chronic aspiration.  Qualify for POC today, new order.  Encouraged to continue to check oxygen saturations with exertion and at rest, goal 88% or higher.  Can decrease over time if able.  Pneumonia: Likely aspiration, posterior segment right upper lobe.  Repeat CT scan shows marked improvement in this area.   Return in about 2 months (around 08/16/2022).   Karren Burly, MD 06/17/2022   This appointment required 41 minutes of patient care (this includes precharting, chart review, review of results, face-to-face care, etc.).

## 2022-06-18 DIAGNOSIS — R2681 Unsteadiness on feet: Secondary | ICD-10-CM | POA: Diagnosis not present

## 2022-06-18 DIAGNOSIS — R29898 Other symptoms and signs involving the musculoskeletal system: Secondary | ICD-10-CM | POA: Diagnosis not present

## 2022-06-18 DIAGNOSIS — M6281 Muscle weakness (generalized): Secondary | ICD-10-CM | POA: Diagnosis not present

## 2022-06-20 DIAGNOSIS — M6281 Muscle weakness (generalized): Secondary | ICD-10-CM | POA: Diagnosis not present

## 2022-06-20 DIAGNOSIS — R29898 Other symptoms and signs involving the musculoskeletal system: Secondary | ICD-10-CM | POA: Diagnosis not present

## 2022-06-20 DIAGNOSIS — R2681 Unsteadiness on feet: Secondary | ICD-10-CM | POA: Diagnosis not present

## 2022-06-21 DIAGNOSIS — R2681 Unsteadiness on feet: Secondary | ICD-10-CM | POA: Diagnosis not present

## 2022-06-21 DIAGNOSIS — M6281 Muscle weakness (generalized): Secondary | ICD-10-CM | POA: Diagnosis not present

## 2022-06-21 DIAGNOSIS — R29898 Other symptoms and signs involving the musculoskeletal system: Secondary | ICD-10-CM | POA: Diagnosis not present

## 2022-06-22 DIAGNOSIS — R2681 Unsteadiness on feet: Secondary | ICD-10-CM | POA: Diagnosis not present

## 2022-06-22 DIAGNOSIS — R29898 Other symptoms and signs involving the musculoskeletal system: Secondary | ICD-10-CM | POA: Diagnosis not present

## 2022-06-22 DIAGNOSIS — M6281 Muscle weakness (generalized): Secondary | ICD-10-CM | POA: Diagnosis not present

## 2022-06-23 ENCOUNTER — Non-Acute Institutional Stay (SKILLED_NURSING_FACILITY): Payer: Medicare HMO | Admitting: Nurse Practitioner

## 2022-06-23 DIAGNOSIS — E871 Hypo-osmolality and hyponatremia: Secondary | ICD-10-CM | POA: Diagnosis not present

## 2022-06-23 DIAGNOSIS — R600 Localized edema: Secondary | ICD-10-CM | POA: Diagnosis not present

## 2022-06-23 DIAGNOSIS — K5901 Slow transit constipation: Secondary | ICD-10-CM | POA: Diagnosis not present

## 2022-06-23 DIAGNOSIS — K219 Gastro-esophageal reflux disease without esophagitis: Secondary | ICD-10-CM | POA: Diagnosis not present

## 2022-06-23 DIAGNOSIS — F339 Major depressive disorder, recurrent, unspecified: Secondary | ICD-10-CM | POA: Diagnosis not present

## 2022-06-23 DIAGNOSIS — I10 Essential (primary) hypertension: Secondary | ICD-10-CM

## 2022-06-23 DIAGNOSIS — F0390 Unspecified dementia without behavioral disturbance: Secondary | ICD-10-CM | POA: Diagnosis not present

## 2022-06-23 DIAGNOSIS — J449 Chronic obstructive pulmonary disease, unspecified: Secondary | ICD-10-CM

## 2022-06-23 DIAGNOSIS — R29898 Other symptoms and signs involving the musculoskeletal system: Secondary | ICD-10-CM | POA: Diagnosis not present

## 2022-06-23 DIAGNOSIS — C50919 Malignant neoplasm of unspecified site of unspecified female breast: Secondary | ICD-10-CM

## 2022-06-23 DIAGNOSIS — R2681 Unsteadiness on feet: Secondary | ICD-10-CM | POA: Diagnosis not present

## 2022-06-23 DIAGNOSIS — M6281 Muscle weakness (generalized): Secondary | ICD-10-CM | POA: Diagnosis not present

## 2022-06-23 NOTE — Assessment & Plan Note (Signed)
Na 132 05/15/22<<137 05/19/22

## 2022-06-23 NOTE — Assessment & Plan Note (Signed)
GERD/Esophageal dysphagia, had MBS, on Pantoprazole, Hgb 10.1 05/19/22 

## 2022-06-23 NOTE — Progress Notes (Signed)
Location:   SNF [facility] Room Number: 60A Place of Service:  SNF (31)  Provider: Chipper Oman NP  PCP: Frederica Kuster, MD Patient Care Team: Frederica Kuster, MD as PCP - General (Family Medicine) Quintella Reichert, MD as PCP - Cardiology (Cardiology)  Extended Emergency Contact Information Primary Emergency Contact: Ruch,Holly Address: [address]          [place], [place] [address] [place] Phone: [phone] Relation: Niece Secondary Emergency Contact: Ruch,George Address: [address]          [place], [place] [address] [place] Home Phone: [phone] Mobile Phone: [phone] Relation: Nephew  Code Status: DNR Goals of care:  Advanced Directive information    05/16/2022    3:05 PM  Advanced Directives  Does Patient Have a Medical Advance Directive? No  Would patient like information on creating a medical advance directive? No - Patient declined     Allergies  Allergen Reactions   Albuterol Other (See Comments)    "Had some rapid heartbeat in 2013 when she took it for some bronchitis"   Diltiazem Other (See Comments)    "Pre-syncope, bp went a bit low for bp on 180 mg dose"     Naproxen Swelling   Other Nausea Only and Other (See Comments)    AN UNNAMED PAIN MEDICATION CAUSED NAUSEA   Cardizem [Diltiazem Hcl] Other (See Comments)    Hypotension   Nitrofurantoin Rash and Other (See Comments)    Sept., 2018      Chief Complaint  Patient presents with   Acute Visit    Discharge to [place] [facility]    HPI:  89 y.o. female  with medical history significant of COPD, constipation, depression, HTN, GERD, and dementia was admitted to [facility] for therapy following her hospital stay   Hospitalized 05/09/22-05/16/22 for PNA, fully treated with antibiotics.  The patient has recovered from PNA, regained physical strength and ADL function, she is ready to transfer to [place] [facility] for continuation of therapy, goal of return to [place] is guarded.               Dementia, on Memantine, Galantamine, 05/31/22 MMSE 20/30             Constipation, taking prn MiraLax             COPD O2 dependent/ILD,  Hx of DOE, followed by Pulmonology, negative ANA, rheumatoid factor, ESR, CRP             Depression, on Lexapro             HTN, off Metoprolol 12.5mg  bid 2/2 low Bp, Bun/creat 11/0.78 05/19/22             Edema minimal, no weight gains.              Breast cancer, in remission             GERD/Esophageal dysphagia, had MBS, on Pantoprazole, Hgb 10.1 05/19/22             Hyponatremia, Na 132 05/15/22<<137 05/19/22 Past Medical History:  Diagnosis Date   Anxiety    Bradycardia    asymptomatic   Breast CA (HCC) 1993 OR 1994   LEFT, SURGERY AND RADIATION DONE   COPD (chronic obstructive pulmonary disease) (HCC)    MILD, NO INHALERS USED   DEGENERATIVE JOINT DISEASE, KNEE    Family history of anesthesia complication    NEPHEW NAUSEA/VOMITING  GENU VALGUM    Hyperlipidemia    Hypertension    Numbness of leg 2011   just left shin   OTHER ACQUIRED DEFORMITY OF ANKLE AND FOOT OTHER    Pneumonia 2013   X 2   PVC's (premature ventricular contractions)    ROTATOR CUFF SYNDROME, LEFT    CANNOT LIFT LEFT ARM ALL THE WAY UP   UNEQUAL LEG LENGTH    UTI (urinary tract infection)    STARTED AUGMENTIN  ON 09-10-13    Past Surgical History:  Procedure Laterality Date   APPENDECTOMY  AGE 66   BREAST LUMPECTOMY Left 1991   radiation   BREAST SURGERY Left 1993 OR 1994   LUMPECTOMY AND RADIATION DONE   JOINT REPLACEMENT     NASAL SINUS SURGERY  2006   REVERSE SHOULDER ARTHROPLASTY Left 07/31/2015   Procedure: LEFT REVERSE SHOULDER ARTHROPLASTY;  Surgeon: Beverely Low, MD;  Location: [facility] OR;  Service: Orthopedics;  Laterality: Left;   TOTAL HIP ARTHROPLASTY Bilateral LEFT 2008 AND RIGHT 2010   TOTAL KNEE ARTHROPLASTY Left 09/17/2013   Procedure: LEFT TOTAL KNEE ARTHROPLASTY;  Surgeon: Shelda Pal, MD;  Location: [facility] ORS;  Service: Orthopedics;   Laterality: Left;      reports that she quit smoking about 30 years ago. Her smoking use included cigarettes. She has a 3.75 pack-year smoking history. She has never been exposed to tobacco smoke. She has never used smokeless tobacco. She reports current alcohol use. She reports that she does not use drugs. Social History   Socioeconomic History   Marital status: Widowed    Spouse name: Not on file   Number of children: Not on file   Years of education: Not on file   Highest education level: Not on file  Occupational History   Not on file  Tobacco Use   Smoking status: Former    Packs/day: 0.25    Years: 15.00    Additional pack years: 0.00    Total pack years: 3.75    Types: Cigarettes    Quit date: 01/18/1992    Years since quitting: 30.4    Passive exposure: Never   Smokeless tobacco: Never  Substance and Sexual Activity   Alcohol use: Yes    Comment: WINE 2 GLASSES PER DAY   Drug use: No   Sexual activity: Not on file  Other Topics Concern   Not on file  Social History Narrative   Not on file   Social Determinants of Health   Financial Resource Strain: Not on file  Food Insecurity: No Food Insecurity (05/10/2022)   Hunger Vital Sign    Worried About Running Out of Food in the Last Year: Never true    Ran Out of Food in the Last Year: Never true  Transportation Needs: No Transportation Needs (05/10/2022)   PRAPARE - Administrator, Civil Service (Medical): No    Lack of Transportation (Non-Medical): No  Physical Activity: Not on file  Stress: Not on file  Social Connections: Not on file  Intimate Partner Violence: Not At Risk (05/10/2022)   Humiliation, Afraid, Rape, and Kick questionnaire    Fear of Current or Ex-Partner: No    Emotionally Abused: No    Physically Abused: No    Sexually Abused: No   Functional Status Survey:    Allergies  Allergen Reactions   Albuterol Other (See Comments)    "Had some rapid heartbeat in 2013 when she took it  for some bronchitis"  Diltiazem Other (See Comments)    "Pre-syncope, bp went a bit low for bp on 180 mg dose"     Naproxen Swelling   Other Nausea Only and Other (See Comments)    AN UNNAMED PAIN MEDICATION CAUSED NAUSEA   Cardizem [Diltiazem Hcl] Other (See Comments)    Hypotension   Nitrofurantoin Rash and Other (See Comments)    Sept., 2018      Pertinent  Health Maintenance Due  Topic Date Due   INFLUENZA VACCINE  08/18/2022   DEXA SCAN  Completed    Medications: Allergies as of 06/23/2022       Reactions   Albuterol Other (See Comments)   "Had some rapid heartbeat in 2013 when she took it for some bronchitis"   Diltiazem Other (See Comments)   "Pre-syncope, bp went a bit low for bp on 180 mg dose"   Naproxen Swelling   Other Nausea Only, Other (See Comments)   AN UNNAMED PAIN MEDICATION CAUSED NAUSEA   Cardizem [diltiazem Hcl] Other (See Comments)   Hypotension   Nitrofurantoin Rash, Other (See Comments)   Sept., 2018        Medication List        Accurate as of June 23, 2022 11:59 PM. If you have any questions, ask your nurse or doctor.          Align [employer] 2 tablets by mouth daily.   atorvastatin 20 MG tablet Commonly known as: LIPITOR Take 20 mg by mouth daily.   Cyanocobalamin 1000 MCG/ML Kit Inject 1,000 mcg into the skin See admin instructions. Inject 1,000 mcg into the skin every 8 weeks   Vitamin B 12 500 MCG Tabs Take 1,000 mg by mouth daily.   escitalopram 20 MG tablet Commonly known as: LEXAPRO Take 20 mg by mouth daily.   feeding supplement Liqd Take 237 mLs by mouth 3 (three) times daily between meals.   galantamine 16 MG 24 hr capsule Commonly known as: RAZADYNE ER Take 16 mg by mouth daily with breakfast.   memantine 28 MG Cp24 24 hr capsule Commonly known as: NAMENDA XR Take 1 capsule (28 mg total) by mouth daily.   metoprolol tartrate 25 MG tablet Commonly known as: LOPRESSOR Take 0.5 tablets (12.5 mg total) by  mouth 2 (two) times daily.   OXYGEN Inhale 2 L into the lungs daily.   pantoprazole 40 MG tablet Commonly known as: PROTONIX Take 1 tablet (40 mg total) by mouth daily.   polyethylene glycol 17 g packet Commonly known as: MIRALAX / GLYCOLAX Take 17 g by mouth daily as needed for mild constipation.        Review of Systems  Constitutional:  Negative for appetite change, fatigue and fever.  HENT:  Positive for hearing loss and trouble swallowing. Negative for congestion and voice change.   Eyes:  Negative for visual disturbance.  Respiratory:  Positive for cough and shortness of breath. Negative for chest tightness and wheezing.   Cardiovascular:  Positive for leg swelling. Negative for chest pain and palpitations.  Gastrointestinal:  Negative for abdominal pain, constipation, nausea and vomiting.  Genitourinary:  Negative for difficulty urinating, dysuria, frequency and urgency.       Chronic urinary leakage.   Musculoskeletal:  Positive for arthralgias and gait problem.  Neurological:  Negative for speech difficulty, weakness and light-headedness.       Memory lapses.   Psychiatric/Behavioral:  Negative for confusion and sleep disturbance. The patient is not nervous/anxious.  Vitals:   06/23/22 1257 06/24/22 1139  BP: (!) 148/86 120/68  Pulse: 78   Resp: 18   Temp: 98.4 F (36.9 C)   Weight: 114 lb 6.4 oz (51.9 kg)   Height: 5\' 4"  (1.626 m)    Body mass index is 19.64 kg/m. Physical Exam Vitals and nursing note reviewed.  Constitutional:      Appearance: Normal appearance.  HENT:     Head: Normocephalic and atraumatic.     Nose: Nose normal.     Mouth/Throat:     Mouth: Mucous membranes are moist.  Eyes:     Extraocular Movements: Extraocular movements intact.     Conjunctiva/sclera: Conjunctivae normal.     Pupils: Pupils are equal, round, and reactive to light.  Cardiovascular:     Rate and Rhythm: Normal rate and regular rhythm.     Heart sounds: No  murmur heard. Pulmonary:     Effort: Pulmonary effort is normal.     Breath sounds: Rales present. No wheezing or rhonchi.     Comments: Bibasilar rales. O2 dependent.  Abdominal:     General: Bowel sounds are normal.     Palpations: Abdomen is soft.     Tenderness: There is no abdominal tenderness. There is no right CVA tenderness, left CVA tenderness, guarding or rebound.  Musculoskeletal:     Cervical back: Normal range of motion and neck supple.     Right lower leg: Edema present.     Left lower leg: Edema present.     Comments: 1+ R+L ankle/foot swelling, dorsalis pedis pulses present.   Skin:    General: Skin is warm and dry.  Neurological:     General: No focal deficit present.     Mental Status: She is alert and oriented to person, place, and time. Mental status is at baseline.     Motor: No weakness.     Coordination: Coordination normal.     Gait: Gait abnormal.  Psychiatric:        Mood and Affect: Mood normal.        Behavior: Behavior normal.        Thought Content: Thought content normal.     Labs reviewed: Basic Metabolic Panel: Recent Labs    05/10/22 0452 05/13/22 0414 05/15/22 0414  NA 132* 134* 132*  K 3.6 3.8 4.3  CL 99 100 98  CO2 24 27 23   GLUCOSE 93 96 101*  BUN 15 11 12   CREATININE 0.78 0.74 0.78  CALCIUM 8.2* 8.3* 8.7*  PHOS  --   --  2.8   Liver Function Tests: Recent Labs    05/09/22 1205 05/15/22 0414  AST 26  --   ALT 18  --   ALKPHOS 84  --   BILITOT 0.9  --   PROT 7.3  --   ALBUMIN 2.7* 2.6*   No results for input(s): "LIPASE", "AMYLASE" in the last 8760 hours. No results for input(s): "AMMONIA" in the last 8760 hours. CBC: Recent Labs    05/09/22 1525 05/10/22 0452 05/13/22 0414 05/15/22 0414  WBC 10.1 7.9 6.3 8.8  NEUTROABS 7.9*  --   --  5.3  HGB 12.8 10.8* 10.8* 11.2*  HCT 39.8 32.9* 33.5* 34.4*  MCV 99.0 99.1 98.8 97.5  PLT 268 201 198 263   Cardiac Enzymes: No results for input(s): "CKTOTAL", "CKMB",  "CKMBINDEX", "TROPONINI" in the last 8760 hours. BNP: Invalid input(s): "POCBNP" CBG: No results for input(s): "GLUCAP" in the last 8760 hours.  Procedures and Imaging Studies During Stay: CT Chest Wo Contrast  Result Date: 06/18/2022 CLINICAL DATA:  Follow-up pneumonia. Ex-smoker. EXAM: CT CHEST WITHOUT CONTRAST TECHNIQUE: Multidetector CT imaging of the chest was performed following the standard protocol without IV contrast. RADIATION DOSE REDUCTION: This exam was performed according to the departmental dose-optimization program which includes automated exposure control, adjustment of the mA and/or kV according to patient size and/or use of iterative reconstruction technique. COMPARISON:  Chest CTA dated 05/09/2022 FINDINGS: Cardiovascular: Atheromatous calcifications, including the coronary arteries and aorta. Stable enlarged heart. The central pulmonary arteries remain enlarged. Mediastinum/Nodes: No enlarged mediastinal or axillary lymph nodes. Thyroid gland, trachea, and esophagus demonstrate no significant findings. Lungs/Pleura: Significant decrease in areas of consolidation in both lungs. No new areas of consolidation. The interstitial markings are less prominent. Cylindrical and cystic bronchiectasis in the right middle lobe. Bilateral paraseptal and centrilobular bullous changes. Bilateral peripheral honeycombing is noted, most pronounced at the lung bases and posteriorly. No pleural fluid, lung masses or lung nodules. Upper Abdomen: Atheromatous arterial calcifications. Musculoskeletal: Left shoulder prosthesis. Right shoulder degenerative changes. Extensive thoracic and lower cervical spine degenerative changes. IMPRESSION: 1. Significant decrease in areas of consolidation in both lungs, compatible with resolving pneumonia. 2. The interstitial markings are less prominent compatible with resolved interstitial pulmonary edema or inflammatory changes. 3. Stable chronic interstitial lung disease. 4.  Stable changes of COPD and chronic bronchitis. 5. Stable enlarged central pulmonary arteries, compatible with pulmonary arterial hypertension. 6.  Calcific coronary artery and aortic atherosclerosis. 7. Aortic Atherosclerosis (ICD10-I70.0) and Emphysema (ICD10-J43.9). Electronically Signed   By: Beckie Salts M.D.   On: 06/18/2022 18:01    Assessment/Plan:   Hypertension off Metoprolol 12.5mg  bid 2/2 low Bp, Bun/creat 11/0.78 05/19/22  Recurrent depression (HCC) Stable,  on Lexapro  Peripheral edema Edema minimal, no weight gains.   GERD (gastroesophageal reflux disease) GERD/Esophageal dysphagia, had MBS, on Pantoprazole, Hgb 10.1 05/19/22  Hyponatremia  Na 132 05/15/22<<137 05/19/22  Breast cancer (HCC)  in remission  COPD (chronic obstructive pulmonary disease) (HCC) COPD O2 dependent/ILD,  Hx of DOE, followed by Pulmonology, negative ANA, rheumatoid factor, ESR, CRP. 06/16/22 Pulmonology, 6 min walk if O2 can be decreased or dc  Slow transit constipation Stable, taking prn MiraLax  Senile dementia (HCC) on Memantine, Galantamine, 05/31/22 MMSE 20/30   Patient is being discharged with the following home health services:    Patient is being discharged with the following durable medical equipment:    Patient has been advised to f/u with their PCP in 1-2 weeks to bring them up to date on their rehab stay.  Social services at facility was responsible for arranging this appointment.  Pt was provided with a 30 day supply of prescriptions for medications and refills must be obtained from their PCP.  For controlled substances, a more limited supply may be provided adequate until PCP appointment only.  Future labs/tests needed:  prn

## 2022-06-23 NOTE — Assessment & Plan Note (Signed)
Stable, on Lexapro 

## 2022-06-23 NOTE — Assessment & Plan Note (Signed)
off Metoprolol 12.5mg bid 2/2 low Bp, Bun/creat 11/0.78 05/19/22 

## 2022-06-23 NOTE — Assessment & Plan Note (Signed)
Edema minimal, no weight gains.

## 2022-06-23 NOTE — Assessment & Plan Note (Signed)
Stable, taking prn MiraLax 

## 2022-06-23 NOTE — Assessment & Plan Note (Addendum)
COPD O2 dependent/ILD,  Hx of DOE, followed by Pulmonology, negative ANA, rheumatoid factor, ESR, CRP. 06/16/22 Pulmonology, 6 min walk if O2 can be decreased or dc

## 2022-06-23 NOTE — Assessment & Plan Note (Signed)
inremission

## 2022-06-23 NOTE — Assessment & Plan Note (Signed)
on Memantine, Galantamine, 05/31/22 MMSE 20/30 

## 2022-06-24 ENCOUNTER — Encounter: Payer: Self-pay | Admitting: Nurse Practitioner

## 2022-06-27 DIAGNOSIS — F324 Major depressive disorder, single episode, in partial remission: Secondary | ICD-10-CM | POA: Diagnosis not present

## 2022-06-27 DIAGNOSIS — R4701 Aphasia: Secondary | ICD-10-CM | POA: Diagnosis not present

## 2022-06-27 DIAGNOSIS — R41841 Cognitive communication deficit: Secondary | ICD-10-CM | POA: Diagnosis not present

## 2022-06-27 DIAGNOSIS — F02A4 Dementia in other diseases classified elsewhere, mild, with anxiety: Secondary | ICD-10-CM | POA: Diagnosis not present

## 2022-06-27 DIAGNOSIS — R278 Other lack of coordination: Secondary | ICD-10-CM | POA: Diagnosis not present

## 2022-06-27 DIAGNOSIS — I10 Essential (primary) hypertension: Secondary | ICD-10-CM | POA: Diagnosis not present

## 2022-06-27 DIAGNOSIS — J449 Chronic obstructive pulmonary disease, unspecified: Secondary | ICD-10-CM | POA: Diagnosis not present

## 2022-06-27 DIAGNOSIS — K219 Gastro-esophageal reflux disease without esophagitis: Secondary | ICD-10-CM | POA: Diagnosis not present

## 2022-06-28 DIAGNOSIS — R41841 Cognitive communication deficit: Secondary | ICD-10-CM | POA: Diagnosis not present

## 2022-06-28 DIAGNOSIS — R278 Other lack of coordination: Secondary | ICD-10-CM | POA: Diagnosis not present

## 2022-06-28 DIAGNOSIS — J449 Chronic obstructive pulmonary disease, unspecified: Secondary | ICD-10-CM | POA: Diagnosis not present

## 2022-06-28 DIAGNOSIS — F02A4 Dementia in other diseases classified elsewhere, mild, with anxiety: Secondary | ICD-10-CM | POA: Diagnosis not present

## 2022-06-28 DIAGNOSIS — J189 Pneumonia, unspecified organism: Secondary | ICD-10-CM | POA: Diagnosis not present

## 2022-06-28 DIAGNOSIS — I10 Essential (primary) hypertension: Secondary | ICD-10-CM | POA: Diagnosis not present

## 2022-06-28 DIAGNOSIS — R2681 Unsteadiness on feet: Secondary | ICD-10-CM | POA: Diagnosis not present

## 2022-06-28 DIAGNOSIS — K219 Gastro-esophageal reflux disease without esophagitis: Secondary | ICD-10-CM | POA: Diagnosis not present

## 2022-06-28 DIAGNOSIS — M6281 Muscle weakness (generalized): Secondary | ICD-10-CM | POA: Diagnosis not present

## 2022-06-28 DIAGNOSIS — F324 Major depressive disorder, single episode, in partial remission: Secondary | ICD-10-CM | POA: Diagnosis not present

## 2022-06-28 DIAGNOSIS — R4701 Aphasia: Secondary | ICD-10-CM | POA: Diagnosis not present

## 2022-06-29 DIAGNOSIS — R4701 Aphasia: Secondary | ICD-10-CM | POA: Diagnosis not present

## 2022-06-29 DIAGNOSIS — R41841 Cognitive communication deficit: Secondary | ICD-10-CM | POA: Diagnosis not present

## 2022-06-29 DIAGNOSIS — F324 Major depressive disorder, single episode, in partial remission: Secondary | ICD-10-CM | POA: Diagnosis not present

## 2022-06-29 DIAGNOSIS — I10 Essential (primary) hypertension: Secondary | ICD-10-CM | POA: Diagnosis not present

## 2022-06-29 DIAGNOSIS — R2681 Unsteadiness on feet: Secondary | ICD-10-CM | POA: Diagnosis not present

## 2022-06-29 DIAGNOSIS — J449 Chronic obstructive pulmonary disease, unspecified: Secondary | ICD-10-CM | POA: Diagnosis not present

## 2022-06-29 DIAGNOSIS — F02A4 Dementia in other diseases classified elsewhere, mild, with anxiety: Secondary | ICD-10-CM | POA: Diagnosis not present

## 2022-06-29 DIAGNOSIS — R278 Other lack of coordination: Secondary | ICD-10-CM | POA: Diagnosis not present

## 2022-06-29 DIAGNOSIS — K219 Gastro-esophageal reflux disease without esophagitis: Secondary | ICD-10-CM | POA: Diagnosis not present

## 2022-06-29 DIAGNOSIS — M6281 Muscle weakness (generalized): Secondary | ICD-10-CM | POA: Diagnosis not present

## 2022-07-01 DIAGNOSIS — F324 Major depressive disorder, single episode, in partial remission: Secondary | ICD-10-CM | POA: Diagnosis not present

## 2022-07-01 DIAGNOSIS — R4701 Aphasia: Secondary | ICD-10-CM | POA: Diagnosis not present

## 2022-07-01 DIAGNOSIS — J449 Chronic obstructive pulmonary disease, unspecified: Secondary | ICD-10-CM | POA: Diagnosis not present

## 2022-07-01 DIAGNOSIS — R2681 Unsteadiness on feet: Secondary | ICD-10-CM | POA: Diagnosis not present

## 2022-07-01 DIAGNOSIS — J189 Pneumonia, unspecified organism: Secondary | ICD-10-CM | POA: Diagnosis not present

## 2022-07-01 DIAGNOSIS — K219 Gastro-esophageal reflux disease without esophagitis: Secondary | ICD-10-CM | POA: Diagnosis not present

## 2022-07-01 DIAGNOSIS — F02A4 Dementia in other diseases classified elsewhere, mild, with anxiety: Secondary | ICD-10-CM | POA: Diagnosis not present

## 2022-07-01 DIAGNOSIS — R278 Other lack of coordination: Secondary | ICD-10-CM | POA: Diagnosis not present

## 2022-07-01 DIAGNOSIS — R41841 Cognitive communication deficit: Secondary | ICD-10-CM | POA: Diagnosis not present

## 2022-07-01 DIAGNOSIS — I10 Essential (primary) hypertension: Secondary | ICD-10-CM | POA: Diagnosis not present

## 2022-07-01 DIAGNOSIS — M6281 Muscle weakness (generalized): Secondary | ICD-10-CM | POA: Diagnosis not present

## 2022-07-04 DIAGNOSIS — R278 Other lack of coordination: Secondary | ICD-10-CM | POA: Diagnosis not present

## 2022-07-04 DIAGNOSIS — M6281 Muscle weakness (generalized): Secondary | ICD-10-CM | POA: Diagnosis not present

## 2022-07-04 DIAGNOSIS — R2681 Unsteadiness on feet: Secondary | ICD-10-CM | POA: Diagnosis not present

## 2022-07-04 DIAGNOSIS — J189 Pneumonia, unspecified organism: Secondary | ICD-10-CM | POA: Diagnosis not present

## 2022-07-04 DIAGNOSIS — F02A4 Dementia in other diseases classified elsewhere, mild, with anxiety: Secondary | ICD-10-CM | POA: Diagnosis not present

## 2022-07-05 DIAGNOSIS — F02A4 Dementia in other diseases classified elsewhere, mild, with anxiety: Secondary | ICD-10-CM | POA: Diagnosis not present

## 2022-07-05 DIAGNOSIS — J449 Chronic obstructive pulmonary disease, unspecified: Secondary | ICD-10-CM | POA: Diagnosis not present

## 2022-07-05 DIAGNOSIS — M6281 Muscle weakness (generalized): Secondary | ICD-10-CM | POA: Diagnosis not present

## 2022-07-05 DIAGNOSIS — K219 Gastro-esophageal reflux disease without esophagitis: Secondary | ICD-10-CM | POA: Diagnosis not present

## 2022-07-05 DIAGNOSIS — F324 Major depressive disorder, single episode, in partial remission: Secondary | ICD-10-CM | POA: Diagnosis not present

## 2022-07-05 DIAGNOSIS — I10 Essential (primary) hypertension: Secondary | ICD-10-CM | POA: Diagnosis not present

## 2022-07-05 DIAGNOSIS — R4701 Aphasia: Secondary | ICD-10-CM | POA: Diagnosis not present

## 2022-07-05 DIAGNOSIS — J189 Pneumonia, unspecified organism: Secondary | ICD-10-CM | POA: Diagnosis not present

## 2022-07-05 DIAGNOSIS — R278 Other lack of coordination: Secondary | ICD-10-CM | POA: Diagnosis not present

## 2022-07-05 DIAGNOSIS — R41841 Cognitive communication deficit: Secondary | ICD-10-CM | POA: Diagnosis not present

## 2022-07-06 DIAGNOSIS — K219 Gastro-esophageal reflux disease without esophagitis: Secondary | ICD-10-CM | POA: Diagnosis not present

## 2022-07-06 DIAGNOSIS — F324 Major depressive disorder, single episode, in partial remission: Secondary | ICD-10-CM | POA: Diagnosis not present

## 2022-07-06 DIAGNOSIS — I10 Essential (primary) hypertension: Secondary | ICD-10-CM | POA: Diagnosis not present

## 2022-07-06 DIAGNOSIS — R41841 Cognitive communication deficit: Secondary | ICD-10-CM | POA: Diagnosis not present

## 2022-07-06 DIAGNOSIS — M6281 Muscle weakness (generalized): Secondary | ICD-10-CM | POA: Diagnosis not present

## 2022-07-06 DIAGNOSIS — R2681 Unsteadiness on feet: Secondary | ICD-10-CM | POA: Diagnosis not present

## 2022-07-06 DIAGNOSIS — F02A4 Dementia in other diseases classified elsewhere, mild, with anxiety: Secondary | ICD-10-CM | POA: Diagnosis not present

## 2022-07-06 DIAGNOSIS — J449 Chronic obstructive pulmonary disease, unspecified: Secondary | ICD-10-CM | POA: Diagnosis not present

## 2022-07-06 DIAGNOSIS — R4701 Aphasia: Secondary | ICD-10-CM | POA: Diagnosis not present

## 2022-07-06 DIAGNOSIS — R278 Other lack of coordination: Secondary | ICD-10-CM | POA: Diagnosis not present

## 2022-07-07 DIAGNOSIS — M6281 Muscle weakness (generalized): Secondary | ICD-10-CM | POA: Diagnosis not present

## 2022-07-07 DIAGNOSIS — R2681 Unsteadiness on feet: Secondary | ICD-10-CM | POA: Diagnosis not present

## 2022-07-07 DIAGNOSIS — J189 Pneumonia, unspecified organism: Secondary | ICD-10-CM | POA: Diagnosis not present

## 2022-07-07 DIAGNOSIS — R278 Other lack of coordination: Secondary | ICD-10-CM | POA: Diagnosis not present

## 2022-07-07 DIAGNOSIS — F02A4 Dementia in other diseases classified elsewhere, mild, with anxiety: Secondary | ICD-10-CM | POA: Diagnosis not present

## 2022-07-08 DIAGNOSIS — I10 Essential (primary) hypertension: Secondary | ICD-10-CM | POA: Diagnosis not present

## 2022-07-08 DIAGNOSIS — K219 Gastro-esophageal reflux disease without esophagitis: Secondary | ICD-10-CM | POA: Diagnosis not present

## 2022-07-08 DIAGNOSIS — F324 Major depressive disorder, single episode, in partial remission: Secondary | ICD-10-CM | POA: Diagnosis not present

## 2022-07-08 DIAGNOSIS — R4701 Aphasia: Secondary | ICD-10-CM | POA: Diagnosis not present

## 2022-07-08 DIAGNOSIS — R41841 Cognitive communication deficit: Secondary | ICD-10-CM | POA: Diagnosis not present

## 2022-07-08 DIAGNOSIS — R278 Other lack of coordination: Secondary | ICD-10-CM | POA: Diagnosis not present

## 2022-07-08 DIAGNOSIS — F02A4 Dementia in other diseases classified elsewhere, mild, with anxiety: Secondary | ICD-10-CM | POA: Diagnosis not present

## 2022-07-08 DIAGNOSIS — J449 Chronic obstructive pulmonary disease, unspecified: Secondary | ICD-10-CM | POA: Diagnosis not present

## 2022-07-11 DIAGNOSIS — J449 Chronic obstructive pulmonary disease, unspecified: Secondary | ICD-10-CM | POA: Diagnosis not present

## 2022-07-11 DIAGNOSIS — I10 Essential (primary) hypertension: Secondary | ICD-10-CM | POA: Diagnosis not present

## 2022-07-11 DIAGNOSIS — R4701 Aphasia: Secondary | ICD-10-CM | POA: Diagnosis not present

## 2022-07-11 DIAGNOSIS — K219 Gastro-esophageal reflux disease without esophagitis: Secondary | ICD-10-CM | POA: Diagnosis not present

## 2022-07-11 DIAGNOSIS — R41841 Cognitive communication deficit: Secondary | ICD-10-CM | POA: Diagnosis not present

## 2022-07-11 DIAGNOSIS — M6281 Muscle weakness (generalized): Secondary | ICD-10-CM | POA: Diagnosis not present

## 2022-07-11 DIAGNOSIS — R2681 Unsteadiness on feet: Secondary | ICD-10-CM | POA: Diagnosis not present

## 2022-07-11 DIAGNOSIS — R278 Other lack of coordination: Secondary | ICD-10-CM | POA: Diagnosis not present

## 2022-07-11 DIAGNOSIS — F02A4 Dementia in other diseases classified elsewhere, mild, with anxiety: Secondary | ICD-10-CM | POA: Diagnosis not present

## 2022-07-11 DIAGNOSIS — F324 Major depressive disorder, single episode, in partial remission: Secondary | ICD-10-CM | POA: Diagnosis not present

## 2022-07-12 DIAGNOSIS — R41841 Cognitive communication deficit: Secondary | ICD-10-CM | POA: Diagnosis not present

## 2022-07-12 DIAGNOSIS — R4701 Aphasia: Secondary | ICD-10-CM | POA: Diagnosis not present

## 2022-07-12 DIAGNOSIS — J189 Pneumonia, unspecified organism: Secondary | ICD-10-CM | POA: Diagnosis not present

## 2022-07-12 DIAGNOSIS — F324 Major depressive disorder, single episode, in partial remission: Secondary | ICD-10-CM | POA: Diagnosis not present

## 2022-07-12 DIAGNOSIS — R278 Other lack of coordination: Secondary | ICD-10-CM | POA: Diagnosis not present

## 2022-07-12 DIAGNOSIS — I10 Essential (primary) hypertension: Secondary | ICD-10-CM | POA: Diagnosis not present

## 2022-07-12 DIAGNOSIS — F02A4 Dementia in other diseases classified elsewhere, mild, with anxiety: Secondary | ICD-10-CM | POA: Diagnosis not present

## 2022-07-12 DIAGNOSIS — M6281 Muscle weakness (generalized): Secondary | ICD-10-CM | POA: Diagnosis not present

## 2022-07-12 DIAGNOSIS — J449 Chronic obstructive pulmonary disease, unspecified: Secondary | ICD-10-CM | POA: Diagnosis not present

## 2022-07-12 DIAGNOSIS — K219 Gastro-esophageal reflux disease without esophagitis: Secondary | ICD-10-CM | POA: Diagnosis not present

## 2022-07-13 DIAGNOSIS — R4701 Aphasia: Secondary | ICD-10-CM | POA: Diagnosis not present

## 2022-07-13 DIAGNOSIS — R2681 Unsteadiness on feet: Secondary | ICD-10-CM | POA: Diagnosis not present

## 2022-07-13 DIAGNOSIS — J449 Chronic obstructive pulmonary disease, unspecified: Secondary | ICD-10-CM | POA: Diagnosis not present

## 2022-07-13 DIAGNOSIS — F324 Major depressive disorder, single episode, in partial remission: Secondary | ICD-10-CM | POA: Diagnosis not present

## 2022-07-13 DIAGNOSIS — M6281 Muscle weakness (generalized): Secondary | ICD-10-CM | POA: Diagnosis not present

## 2022-07-13 DIAGNOSIS — F02A4 Dementia in other diseases classified elsewhere, mild, with anxiety: Secondary | ICD-10-CM | POA: Diagnosis not present

## 2022-07-13 DIAGNOSIS — I10 Essential (primary) hypertension: Secondary | ICD-10-CM | POA: Diagnosis not present

## 2022-07-13 DIAGNOSIS — R41841 Cognitive communication deficit: Secondary | ICD-10-CM | POA: Diagnosis not present

## 2022-07-13 DIAGNOSIS — K219 Gastro-esophageal reflux disease without esophagitis: Secondary | ICD-10-CM | POA: Diagnosis not present

## 2022-07-13 DIAGNOSIS — R278 Other lack of coordination: Secondary | ICD-10-CM | POA: Diagnosis not present

## 2022-07-14 DIAGNOSIS — R278 Other lack of coordination: Secondary | ICD-10-CM | POA: Diagnosis not present

## 2022-07-14 DIAGNOSIS — M6281 Muscle weakness (generalized): Secondary | ICD-10-CM | POA: Diagnosis not present

## 2022-07-14 DIAGNOSIS — F02A4 Dementia in other diseases classified elsewhere, mild, with anxiety: Secondary | ICD-10-CM | POA: Diagnosis not present

## 2022-07-14 DIAGNOSIS — R2681 Unsteadiness on feet: Secondary | ICD-10-CM | POA: Diagnosis not present

## 2022-07-14 DIAGNOSIS — J189 Pneumonia, unspecified organism: Secondary | ICD-10-CM | POA: Diagnosis not present

## 2022-07-18 DIAGNOSIS — R2681 Unsteadiness on feet: Secondary | ICD-10-CM | POA: Diagnosis not present

## 2022-07-18 DIAGNOSIS — M6281 Muscle weakness (generalized): Secondary | ICD-10-CM | POA: Diagnosis not present

## 2022-07-18 DIAGNOSIS — R278 Other lack of coordination: Secondary | ICD-10-CM | POA: Diagnosis not present

## 2022-07-18 DIAGNOSIS — F02A4 Dementia in other diseases classified elsewhere, mild, with anxiety: Secondary | ICD-10-CM | POA: Diagnosis not present

## 2022-07-19 DIAGNOSIS — M6281 Muscle weakness (generalized): Secondary | ICD-10-CM | POA: Diagnosis not present

## 2022-07-19 DIAGNOSIS — J189 Pneumonia, unspecified organism: Secondary | ICD-10-CM | POA: Diagnosis not present

## 2022-07-20 ENCOUNTER — Encounter: Payer: Self-pay | Admitting: Internal Medicine

## 2022-07-20 ENCOUNTER — Non-Acute Institutional Stay: Payer: Medicare HMO | Admitting: Internal Medicine

## 2022-07-20 DIAGNOSIS — I10 Essential (primary) hypertension: Secondary | ICD-10-CM

## 2022-07-20 DIAGNOSIS — M6281 Muscle weakness (generalized): Secondary | ICD-10-CM | POA: Diagnosis not present

## 2022-07-20 DIAGNOSIS — G301 Alzheimer's disease with late onset: Secondary | ICD-10-CM

## 2022-07-20 DIAGNOSIS — F02B0 Dementia in other diseases classified elsewhere, moderate, without behavioral disturbance, psychotic disturbance, mood disturbance, and anxiety: Secondary | ICD-10-CM | POA: Diagnosis not present

## 2022-07-20 DIAGNOSIS — J449 Chronic obstructive pulmonary disease, unspecified: Secondary | ICD-10-CM

## 2022-07-20 DIAGNOSIS — E871 Hypo-osmolality and hyponatremia: Secondary | ICD-10-CM | POA: Diagnosis not present

## 2022-07-20 DIAGNOSIS — F324 Major depressive disorder, single episode, in partial remission: Secondary | ICD-10-CM | POA: Diagnosis not present

## 2022-07-20 DIAGNOSIS — F02A4 Dementia in other diseases classified elsewhere, mild, with anxiety: Secondary | ICD-10-CM | POA: Diagnosis not present

## 2022-07-20 DIAGNOSIS — R2681 Unsteadiness on feet: Secondary | ICD-10-CM | POA: Diagnosis not present

## 2022-07-20 DIAGNOSIS — R4701 Aphasia: Secondary | ICD-10-CM | POA: Diagnosis not present

## 2022-07-20 DIAGNOSIS — K219 Gastro-esophageal reflux disease without esophagitis: Secondary | ICD-10-CM | POA: Diagnosis not present

## 2022-07-20 DIAGNOSIS — R278 Other lack of coordination: Secondary | ICD-10-CM | POA: Diagnosis not present

## 2022-07-20 DIAGNOSIS — R41841 Cognitive communication deficit: Secondary | ICD-10-CM | POA: Diagnosis not present

## 2022-07-20 NOTE — Progress Notes (Signed)
Provider:   Location:  [facility] Room Number: 09 Place of Service:  ALF [phone]  PCP: Frederica Kuster, MD Patient Care Team: Frederica Kuster, MD as PCP - General (Family Medicine) Quintella Reichert, MD as PCP - Cardiology (Cardiology)  Extended Emergency Contact Information Primary Emergency Contact: Ruch,Holly Address: [address]          [place], [place] [address] [place] Phone: [phone] Relation: Niece Secondary Emergency Contact: Ruch,George Address: [address]          [place], [place] [address] [place] Home Phone: [phone] Mobile Phone: [phone] Relation: Nephew  Code Status: DNR Goals of Care: Advanced Directive information    07/20/2022    4:22 PM  Advanced Directives  Does Patient Have a Medical Advance Directive? Yes  Does patient want to make changes to medical advance directive? No - Patient declined      Chief Complaint  Patient presents with   New Admit To SNF    Patient is being seen for new admit    Immunizations    Patient is due for covid and pneumonia vaccine     HPI: Patient is a 89 y.o. female seen today for admission to AL in [facility] Patient has a history of Recent pneumonia in 4/24.  She was in [facility]  [place] for therapy.  And is now admitted in assisted living She has a history of dementia last MMSE 20/30 MRI in 2023 showed Cerebral Atropy  COPD Using Oxygen now only at night Goal 88% or Higher Follows with Pulmonary CLD on CT scan Work up was negative Thought to be due to Chronic Aspirations  Dysphagia ST recommends Regular Diet but take aspiration precautions  Also h/o HLD, Depression, Hyponatremia, GERD  Patient did not have any complaints and was very paranoid of my visit.  Nurses in the facility patient seems to have adjusted to AL and is able to do her ADLs.  She walks without any walker.  Stays mostly in her room  Past Medical History:  Diagnosis  Date   Anxiety    Bradycardia    asymptomatic   Breast CA (HCC) 1993 OR 1994   LEFT, SURGERY AND RADIATION DONE   COPD (chronic obstructive pulmonary disease) (HCC)    MILD, NO INHALERS USED   DEGENERATIVE JOINT DISEASE, KNEE    Family history of anesthesia complication    NEPHEW NAUSEA/VOMITING   GENU VALGUM    Hyperlipidemia    Hypertension    Numbness of leg 2011   just left shin   OTHER ACQUIRED DEFORMITY OF ANKLE AND FOOT OTHER    Pneumonia 2013   X 2   PVC's (premature ventricular contractions)    ROTATOR CUFF SYNDROME, LEFT    CANNOT LIFT LEFT ARM ALL THE WAY UP   UNEQUAL LEG LENGTH    UTI (urinary tract infection)    STARTED AUGMENTIN  ON 09-10-13   Past Surgical History:  Procedure Laterality Date   APPENDECTOMY  AGE 85   BREAST LUMPECTOMY Left 1991   radiation   BREAST SURGERY Left 1993 OR 1994   LUMPECTOMY AND RADIATION DONE   JOINT REPLACEMENT     NASAL SINUS SURGERY  2006   REVERSE SHOULDER ARTHROPLASTY Left 07/31/2015   Procedure: LEFT REVERSE SHOULDER ARTHROPLASTY;  Surgeon: Beverely Low, MD;  Location: [facility] OR;  Service: Orthopedics;  Laterality: Left;   TOTAL HIP ARTHROPLASTY Bilateral LEFT 2008 AND RIGHT  2010   TOTAL KNEE ARTHROPLASTY Left 09/17/2013   Procedure: LEFT TOTAL KNEE ARTHROPLASTY;  Surgeon: Shelda Pal, MD;  Location: [facility] ORS;  Service: Orthopedics;  Laterality: Left;    reports that she quit smoking about 30 years ago. Her smoking use included cigarettes. She has a 3.75 pack-year smoking history. She has never been exposed to tobacco smoke. She has never used smokeless tobacco. She reports current alcohol use. She reports that she does not use drugs. Social History   Socioeconomic History   Marital status: Widowed    Spouse name: Not on file   Number of children: Not on file   Years of education: Not on file   Highest education level: Not on file  Occupational History   Not on file  Tobacco Use   Smoking status: Former    Packs/day:  0.25    Years: 15.00    Additional pack years: 0.00    Total pack years: 3.75    Types: Cigarettes    Quit date: 01/18/1992    Years since quitting: 30.5    Passive exposure: Never   Smokeless tobacco: Never  Substance and Sexual Activity   Alcohol use: Yes    Comment: WINE 2 GLASSES PER DAY   Drug use: No   Sexual activity: Not on file  Other Topics Concern   Not on file  Social History Narrative   Not on file   Social Determinants of Health   Financial Resource Strain: Not on file  Food Insecurity: No Food Insecurity (05/10/2022)   Hunger Vital Sign    Worried About Running Out of Food in the Last Year: Never true    Ran Out of Food in the Last Year: Never true  Transportation Needs: No Transportation Needs (05/10/2022)   PRAPARE - Administrator, Civil Service (Medical): No    Lack of Transportation (Non-Medical): No  Physical Activity: Not on file  Stress: Not on file  Social Connections: Not on file  Intimate Partner Violence: Not At Risk (05/10/2022)   Humiliation, Afraid, Rape, and Kick questionnaire    Fear of Current or Ex-Partner: No    Emotionally Abused: No    Physically Abused: No    Sexually Abused: No    Functional Status Survey:    Family History  Problem Relation Age of Onset   Diabetes Mother    Heart Problems Father    Hypertension Brother     Health Maintenance  Topic Date Due   COVID-19 Vaccine (1) Never done   Pneumonia Vaccine 71+ Years old (2 of 2 - PCV) 05/25/2013   INFLUENZA VACCINE  08/18/2022   Medicare Annual Wellness (AWV)  10/13/2022   DTaP/Tdap/Td (5 - Td or Tdap) 05/10/2025   DEXA SCAN  Completed   Zoster Vaccines- Shingrix  Completed   HPV VACCINES  Aged Out    Allergies  Allergen Reactions   Albuterol Other (See Comments)    "Had some rapid heartbeat in 2013 when she took it for some bronchitis"   Diltiazem Other (See Comments)    "Pre-syncope, bp went a bit low for bp on 180 mg dose"     Naproxen Swelling    Other Nausea Only and Other (See Comments)    AN UNNAMED PAIN MEDICATION CAUSED NAUSEA   Cardizem [Diltiazem Hcl] Other (See Comments)    Hypotension   Nitrofurantoin Rash and Other (See Comments)    Sept., 2018      Outpatient Encounter Medications as  of 07/20/2022  Medication Sig   atorvastatin (LIPITOR) 20 MG tablet Take 20 mg by mouth daily.   Cyanocobalamin (VITAMIN B 12) 500 MCG TABS Take 1,000 mg by mouth daily.   Cyanocobalamin 1000 MCG/ML KIT Inject 1,000 mcg into the skin See admin instructions. Inject 1,000 mcg into the skin every 8 weeks   escitalopram (LEXAPRO) 20 MG tablet Take 20 mg by mouth daily.   feeding supplement (ENSURE ENLIVE / ENSURE PLUS) LIQD Take 237 mLs by mouth 3 (three) times daily between meals.   galantamine (RAZADYNE ER) 16 MG 24 hr capsule Take 16 mg by mouth daily with breakfast.   memantine (NAMENDA XR) 28 MG CP24 24 hr capsule Take 1 capsule (28 mg total) by mouth daily.   OXYGEN Inhale 2 L into the lungs daily.   pantoprazole (PROTONIX) 40 MG tablet Take 1 tablet (40 mg total) by mouth daily.   polyethylene glycol (MIRALAX / GLYCOLAX) 17 g packet Take 17 g by mouth daily as needed for mild constipation.   Probiotic Product (ALIGN) CHEW Chew 2 tablets by mouth daily.   metoprolol tartrate (LOPRESSOR) 25 MG tablet Take 0.5 tablets (12.5 mg total) by mouth 2 (two) times daily. (Patient not taking: Reported on 07/20/2022)   No facility-administered encounter medications on file as of 07/20/2022.    Review of Systems  Unable to perform ROS: Dementia    Vitals:   07/20/22 1620  BP: 128/78  Pulse: 74  Resp: 20  Temp: 98 F (36.7 C)  TempSrc: Temporal  SpO2: 94%  Weight: 120 lb (54.4 kg)  Height: 5\' 4"  (1.626 m)   Body mass index is 20.6 kg/m. Physical Exam Vitals reviewed.  Constitutional:      Appearance: Normal appearance.  HENT:     Head: Normocephalic.     Nose: Nose normal.     Mouth/Throat:     Mouth: Mucous membranes are moist.      Pharynx: Oropharynx is clear.  Eyes:     Pupils: Pupils are equal, round, and reactive to light.  Cardiovascular:     Rate and Rhythm: Normal rate and regular rhythm.     Pulses: Normal pulses.     Heart sounds: Normal heart sounds. No murmur heard. Pulmonary:     Effort: Pulmonary effort is normal.     Breath sounds: Normal breath sounds.  Abdominal:     General: Abdomen is flat. Bowel sounds are normal.     Palpations: Abdomen is soft.  Musculoskeletal:        General: No swelling.     Cervical back: Neck supple.  Skin:    General: Skin is warm.  Neurological:     General: No focal deficit present.     Mental Status: She is alert.  Psychiatric:        Mood and Affect: Mood normal.        Thought Content: Thought content normal.     Labs reviewed: Basic Metabolic Panel: Recent Labs    05/10/22 0452 05/13/22 0414 05/15/22 0414  NA 132* 134* 132*  K 3.6 3.8 4.3  CL 99 100 98  CO2 24 27 23   GLUCOSE 93 96 101*  BUN 15 11 12   CREATININE 0.78 0.74 0.78  CALCIUM 8.2* 8.3* 8.7*  PHOS  --   --  2.8   Liver Function Tests: Recent Labs    05/09/22 1205 05/15/22 0414  AST 26  --   ALT 18  --   ALKPHOS 84  --  BILITOT 0.9  --   PROT 7.3  --   ALBUMIN 2.7* 2.6*   No results for input(s): "LIPASE", "AMYLASE" in the last 8760 hours. No results for input(s): "AMMONIA" in the last 8760 hours. CBC: Recent Labs    05/09/22 1525 05/10/22 0452 05/13/22 0414 05/15/22 0414  WBC 10.1 7.9 6.3 8.8  NEUTROABS 7.9*  --   --  5.3  HGB 12.8 10.8* 10.8* 11.2*  HCT 39.8 32.9* 33.5* 34.4*  MCV 99.0 99.1 98.8 97.5  PLT 268 201 198 263   Cardiac Enzymes: No results for input(s): "CKTOTAL", "CKMB", "CKMBINDEX", "TROPONINI" in the last 8760 hours. BNP: Invalid input(s): "POCBNP" No results found for: "HGBA1C" Lab Results  Component Value Date   TSH 2.980 03/17/2021   No results found for: "VITAMINB12" No results found for: "FOLATE" No results found for: "IRON",  "TIBC", "FERRITIN"  Imaging and Procedures obtained prior to SNF admission: CT Chest Wo Contrast  Result Date: 06/18/2022 CLINICAL DATA:  Follow-up pneumonia. Ex-smoker. EXAM: CT CHEST WITHOUT CONTRAST TECHNIQUE: Multidetector CT imaging of the chest was performed following the standard protocol without IV contrast. RADIATION DOSE REDUCTION: This exam was performed according to the departmental dose-optimization program which includes automated exposure control, adjustment of the mA and/or kV according to patient size and/or use of iterative reconstruction technique. COMPARISON:  Chest CTA dated 05/09/2022 FINDINGS: Cardiovascular: Atheromatous calcifications, including the coronary arteries and aorta. Stable enlarged heart. The central pulmonary arteries remain enlarged. Mediastinum/Nodes: No enlarged mediastinal or axillary lymph nodes. Thyroid gland, trachea, and esophagus demonstrate no significant findings. Lungs/Pleura: Significant decrease in areas of consolidation in both lungs. No new areas of consolidation. The interstitial markings are less prominent. Cylindrical and cystic bronchiectasis in the right middle lobe. Bilateral paraseptal and centrilobular bullous changes. Bilateral peripheral honeycombing is noted, most pronounced at the lung bases and posteriorly. No pleural fluid, lung masses or lung nodules. Upper Abdomen: Atheromatous arterial calcifications. Musculoskeletal: Left shoulder prosthesis. Right shoulder degenerative changes. Extensive thoracic and lower cervical spine degenerative changes. IMPRESSION: 1. Significant decrease in areas of consolidation in both lungs, compatible with resolving pneumonia. 2. The interstitial markings are less prominent compatible with resolved interstitial pulmonary edema or inflammatory changes. 3. Stable chronic interstitial lung disease. 4. Stable changes of COPD and chronic bronchitis. 5. Stable enlarged central pulmonary arteries, compatible with  pulmonary arterial hypertension. 6.  Calcific coronary artery and aortic atherosclerosis. 7. Aortic Atherosclerosis (ICD10-I70.0) and Emphysema (ICD10-J43.9). Electronically Signed   By: Beckie Salts M.D.   On: 06/18/2022 18:01    Assessment/Plan 1. Primary hypertension Off Metoprolol due to Low BP Will Continue to monitor  2. Recurrent depression (HCC) On Lexapro Has Hyponatremia Will Monitor  3. Gastroesophageal reflux disease, unspecified whether esophagitis present On Protonix  4. Hyponatremia Stable Sodium Patient is on Lexapro will monitor for now Last sodium was 137 5. Chronic obstructive pulmonary disease, unspecified COPD type (HCC) Recent Diagnosis  6. Moderate late onset Alzheimer's dementia without behavioral disturbance, psychotic disturbance, mood disturbance, or anxiety (HCC) On Namenda and Galantamine 7 Vit B12 def On PO and I/m Dosing of B 12  Will Follow with family to see the reason and also will need B 12 levels   Family/ staff Communication:   Labs/tests ordered:

## 2022-07-21 DIAGNOSIS — J449 Chronic obstructive pulmonary disease, unspecified: Secondary | ICD-10-CM | POA: Diagnosis not present

## 2022-07-21 DIAGNOSIS — R278 Other lack of coordination: Secondary | ICD-10-CM | POA: Diagnosis not present

## 2022-07-21 DIAGNOSIS — K219 Gastro-esophageal reflux disease without esophagitis: Secondary | ICD-10-CM | POA: Diagnosis not present

## 2022-07-21 DIAGNOSIS — F324 Major depressive disorder, single episode, in partial remission: Secondary | ICD-10-CM | POA: Diagnosis not present

## 2022-07-21 DIAGNOSIS — F02A4 Dementia in other diseases classified elsewhere, mild, with anxiety: Secondary | ICD-10-CM | POA: Diagnosis not present

## 2022-07-21 DIAGNOSIS — I10 Essential (primary) hypertension: Secondary | ICD-10-CM | POA: Diagnosis not present

## 2022-07-21 DIAGNOSIS — R4701 Aphasia: Secondary | ICD-10-CM | POA: Diagnosis not present

## 2022-07-21 DIAGNOSIS — R41841 Cognitive communication deficit: Secondary | ICD-10-CM | POA: Diagnosis not present

## 2022-07-22 DIAGNOSIS — R4701 Aphasia: Secondary | ICD-10-CM | POA: Diagnosis not present

## 2022-07-22 DIAGNOSIS — I10 Essential (primary) hypertension: Secondary | ICD-10-CM | POA: Diagnosis not present

## 2022-07-22 DIAGNOSIS — F324 Major depressive disorder, single episode, in partial remission: Secondary | ICD-10-CM | POA: Diagnosis not present

## 2022-07-22 DIAGNOSIS — M6281 Muscle weakness (generalized): Secondary | ICD-10-CM | POA: Diagnosis not present

## 2022-07-22 DIAGNOSIS — K219 Gastro-esophageal reflux disease without esophagitis: Secondary | ICD-10-CM | POA: Diagnosis not present

## 2022-07-22 DIAGNOSIS — R278 Other lack of coordination: Secondary | ICD-10-CM | POA: Diagnosis not present

## 2022-07-22 DIAGNOSIS — R41841 Cognitive communication deficit: Secondary | ICD-10-CM | POA: Diagnosis not present

## 2022-07-22 DIAGNOSIS — J449 Chronic obstructive pulmonary disease, unspecified: Secondary | ICD-10-CM | POA: Diagnosis not present

## 2022-07-22 DIAGNOSIS — F02A4 Dementia in other diseases classified elsewhere, mild, with anxiety: Secondary | ICD-10-CM | POA: Diagnosis not present

## 2022-07-22 DIAGNOSIS — R2681 Unsteadiness on feet: Secondary | ICD-10-CM | POA: Diagnosis not present

## 2022-07-25 DIAGNOSIS — J449 Chronic obstructive pulmonary disease, unspecified: Secondary | ICD-10-CM | POA: Diagnosis not present

## 2022-07-25 DIAGNOSIS — R4701 Aphasia: Secondary | ICD-10-CM | POA: Diagnosis not present

## 2022-07-25 DIAGNOSIS — J189 Pneumonia, unspecified organism: Secondary | ICD-10-CM | POA: Diagnosis not present

## 2022-07-25 DIAGNOSIS — R41841 Cognitive communication deficit: Secondary | ICD-10-CM | POA: Diagnosis not present

## 2022-07-25 DIAGNOSIS — R2681 Unsteadiness on feet: Secondary | ICD-10-CM | POA: Diagnosis not present

## 2022-07-25 DIAGNOSIS — I10 Essential (primary) hypertension: Secondary | ICD-10-CM | POA: Diagnosis not present

## 2022-07-25 DIAGNOSIS — R278 Other lack of coordination: Secondary | ICD-10-CM | POA: Diagnosis not present

## 2022-07-25 DIAGNOSIS — K219 Gastro-esophageal reflux disease without esophagitis: Secondary | ICD-10-CM | POA: Diagnosis not present

## 2022-07-25 DIAGNOSIS — M6281 Muscle weakness (generalized): Secondary | ICD-10-CM | POA: Diagnosis not present

## 2022-07-25 DIAGNOSIS — F324 Major depressive disorder, single episode, in partial remission: Secondary | ICD-10-CM | POA: Diagnosis not present

## 2022-07-25 DIAGNOSIS — F02A4 Dementia in other diseases classified elsewhere, mild, with anxiety: Secondary | ICD-10-CM | POA: Diagnosis not present

## 2022-07-26 DIAGNOSIS — F324 Major depressive disorder, single episode, in partial remission: Secondary | ICD-10-CM | POA: Diagnosis not present

## 2022-07-26 DIAGNOSIS — J449 Chronic obstructive pulmonary disease, unspecified: Secondary | ICD-10-CM | POA: Diagnosis not present

## 2022-07-26 DIAGNOSIS — R4701 Aphasia: Secondary | ICD-10-CM | POA: Diagnosis not present

## 2022-07-26 DIAGNOSIS — R278 Other lack of coordination: Secondary | ICD-10-CM | POA: Diagnosis not present

## 2022-07-26 DIAGNOSIS — R41841 Cognitive communication deficit: Secondary | ICD-10-CM | POA: Diagnosis not present

## 2022-07-26 DIAGNOSIS — I10 Essential (primary) hypertension: Secondary | ICD-10-CM | POA: Diagnosis not present

## 2022-07-26 DIAGNOSIS — F02A4 Dementia in other diseases classified elsewhere, mild, with anxiety: Secondary | ICD-10-CM | POA: Diagnosis not present

## 2022-07-26 DIAGNOSIS — K219 Gastro-esophageal reflux disease without esophagitis: Secondary | ICD-10-CM | POA: Diagnosis not present

## 2022-07-27 DIAGNOSIS — F02A4 Dementia in other diseases classified elsewhere, mild, with anxiety: Secondary | ICD-10-CM | POA: Diagnosis not present

## 2022-07-27 DIAGNOSIS — J189 Pneumonia, unspecified organism: Secondary | ICD-10-CM | POA: Diagnosis not present

## 2022-07-27 DIAGNOSIS — R278 Other lack of coordination: Secondary | ICD-10-CM | POA: Diagnosis not present

## 2022-07-27 DIAGNOSIS — R2681 Unsteadiness on feet: Secondary | ICD-10-CM | POA: Diagnosis not present

## 2022-07-27 DIAGNOSIS — M6281 Muscle weakness (generalized): Secondary | ICD-10-CM | POA: Diagnosis not present

## 2022-07-28 DIAGNOSIS — R2681 Unsteadiness on feet: Secondary | ICD-10-CM | POA: Diagnosis not present

## 2022-07-28 DIAGNOSIS — K219 Gastro-esophageal reflux disease without esophagitis: Secondary | ICD-10-CM | POA: Diagnosis not present

## 2022-07-28 DIAGNOSIS — M6281 Muscle weakness (generalized): Secondary | ICD-10-CM | POA: Diagnosis not present

## 2022-07-28 DIAGNOSIS — I10 Essential (primary) hypertension: Secondary | ICD-10-CM | POA: Diagnosis not present

## 2022-07-28 DIAGNOSIS — F02A4 Dementia in other diseases classified elsewhere, mild, with anxiety: Secondary | ICD-10-CM | POA: Diagnosis not present

## 2022-07-28 DIAGNOSIS — R4701 Aphasia: Secondary | ICD-10-CM | POA: Diagnosis not present

## 2022-07-28 DIAGNOSIS — J449 Chronic obstructive pulmonary disease, unspecified: Secondary | ICD-10-CM | POA: Diagnosis not present

## 2022-07-28 DIAGNOSIS — F324 Major depressive disorder, single episode, in partial remission: Secondary | ICD-10-CM | POA: Diagnosis not present

## 2022-07-28 DIAGNOSIS — R41841 Cognitive communication deficit: Secondary | ICD-10-CM | POA: Diagnosis not present

## 2022-07-28 DIAGNOSIS — R278 Other lack of coordination: Secondary | ICD-10-CM | POA: Diagnosis not present

## 2022-08-01 DIAGNOSIS — M6281 Muscle weakness (generalized): Secondary | ICD-10-CM | POA: Diagnosis not present

## 2022-08-01 DIAGNOSIS — J449 Chronic obstructive pulmonary disease, unspecified: Secondary | ICD-10-CM | POA: Diagnosis not present

## 2022-08-01 DIAGNOSIS — R2681 Unsteadiness on feet: Secondary | ICD-10-CM | POA: Diagnosis not present

## 2022-08-01 DIAGNOSIS — F324 Major depressive disorder, single episode, in partial remission: Secondary | ICD-10-CM | POA: Diagnosis not present

## 2022-08-01 DIAGNOSIS — F02A4 Dementia in other diseases classified elsewhere, mild, with anxiety: Secondary | ICD-10-CM | POA: Diagnosis not present

## 2022-08-01 DIAGNOSIS — R278 Other lack of coordination: Secondary | ICD-10-CM | POA: Diagnosis not present

## 2022-08-01 DIAGNOSIS — I10 Essential (primary) hypertension: Secondary | ICD-10-CM | POA: Diagnosis not present

## 2022-08-01 DIAGNOSIS — R41841 Cognitive communication deficit: Secondary | ICD-10-CM | POA: Diagnosis not present

## 2022-08-01 DIAGNOSIS — K219 Gastro-esophageal reflux disease without esophagitis: Secondary | ICD-10-CM | POA: Diagnosis not present

## 2022-08-01 DIAGNOSIS — R4701 Aphasia: Secondary | ICD-10-CM | POA: Diagnosis not present

## 2022-08-01 DIAGNOSIS — J189 Pneumonia, unspecified organism: Secondary | ICD-10-CM | POA: Diagnosis not present

## 2022-08-03 DIAGNOSIS — M6281 Muscle weakness (generalized): Secondary | ICD-10-CM | POA: Diagnosis not present

## 2022-08-03 DIAGNOSIS — F324 Major depressive disorder, single episode, in partial remission: Secondary | ICD-10-CM | POA: Diagnosis not present

## 2022-08-03 DIAGNOSIS — K219 Gastro-esophageal reflux disease without esophagitis: Secondary | ICD-10-CM | POA: Diagnosis not present

## 2022-08-03 DIAGNOSIS — R41841 Cognitive communication deficit: Secondary | ICD-10-CM | POA: Diagnosis not present

## 2022-08-03 DIAGNOSIS — J449 Chronic obstructive pulmonary disease, unspecified: Secondary | ICD-10-CM | POA: Diagnosis not present

## 2022-08-03 DIAGNOSIS — I10 Essential (primary) hypertension: Secondary | ICD-10-CM | POA: Diagnosis not present

## 2022-08-03 DIAGNOSIS — R4701 Aphasia: Secondary | ICD-10-CM | POA: Diagnosis not present

## 2022-08-03 DIAGNOSIS — F02A4 Dementia in other diseases classified elsewhere, mild, with anxiety: Secondary | ICD-10-CM | POA: Diagnosis not present

## 2022-08-03 DIAGNOSIS — R278 Other lack of coordination: Secondary | ICD-10-CM | POA: Diagnosis not present

## 2022-08-03 DIAGNOSIS — J189 Pneumonia, unspecified organism: Secondary | ICD-10-CM | POA: Diagnosis not present

## 2022-08-04 DIAGNOSIS — F02A4 Dementia in other diseases classified elsewhere, mild, with anxiety: Secondary | ICD-10-CM | POA: Diagnosis not present

## 2022-08-04 DIAGNOSIS — R2681 Unsteadiness on feet: Secondary | ICD-10-CM | POA: Diagnosis not present

## 2022-08-04 DIAGNOSIS — R278 Other lack of coordination: Secondary | ICD-10-CM | POA: Diagnosis not present

## 2022-08-04 DIAGNOSIS — M6281 Muscle weakness (generalized): Secondary | ICD-10-CM | POA: Diagnosis not present

## 2022-08-05 DIAGNOSIS — I10 Essential (primary) hypertension: Secondary | ICD-10-CM | POA: Diagnosis not present

## 2022-08-05 DIAGNOSIS — M6281 Muscle weakness (generalized): Secondary | ICD-10-CM | POA: Diagnosis not present

## 2022-08-05 DIAGNOSIS — R278 Other lack of coordination: Secondary | ICD-10-CM | POA: Diagnosis not present

## 2022-08-05 DIAGNOSIS — K219 Gastro-esophageal reflux disease without esophagitis: Secondary | ICD-10-CM | POA: Diagnosis not present

## 2022-08-05 DIAGNOSIS — J449 Chronic obstructive pulmonary disease, unspecified: Secondary | ICD-10-CM | POA: Diagnosis not present

## 2022-08-05 DIAGNOSIS — R2681 Unsteadiness on feet: Secondary | ICD-10-CM | POA: Diagnosis not present

## 2022-08-05 DIAGNOSIS — R41841 Cognitive communication deficit: Secondary | ICD-10-CM | POA: Diagnosis not present

## 2022-08-05 DIAGNOSIS — R4701 Aphasia: Secondary | ICD-10-CM | POA: Diagnosis not present

## 2022-08-05 DIAGNOSIS — F324 Major depressive disorder, single episode, in partial remission: Secondary | ICD-10-CM | POA: Diagnosis not present

## 2022-08-05 DIAGNOSIS — F02A4 Dementia in other diseases classified elsewhere, mild, with anxiety: Secondary | ICD-10-CM | POA: Diagnosis not present

## 2022-08-08 DIAGNOSIS — D519 Vitamin B12 deficiency anemia, unspecified: Secondary | ICD-10-CM | POA: Diagnosis not present

## 2022-08-08 DIAGNOSIS — J189 Pneumonia, unspecified organism: Secondary | ICD-10-CM | POA: Diagnosis not present

## 2022-08-08 DIAGNOSIS — F02A4 Dementia in other diseases classified elsewhere, mild, with anxiety: Secondary | ICD-10-CM | POA: Diagnosis not present

## 2022-08-08 DIAGNOSIS — K219 Gastro-esophageal reflux disease without esophagitis: Secondary | ICD-10-CM | POA: Diagnosis not present

## 2022-08-08 DIAGNOSIS — R278 Other lack of coordination: Secondary | ICD-10-CM | POA: Diagnosis not present

## 2022-08-08 DIAGNOSIS — F324 Major depressive disorder, single episode, in partial remission: Secondary | ICD-10-CM | POA: Diagnosis not present

## 2022-08-08 DIAGNOSIS — M6281 Muscle weakness (generalized): Secondary | ICD-10-CM | POA: Diagnosis not present

## 2022-08-08 DIAGNOSIS — R4701 Aphasia: Secondary | ICD-10-CM | POA: Diagnosis not present

## 2022-08-08 DIAGNOSIS — R41841 Cognitive communication deficit: Secondary | ICD-10-CM | POA: Diagnosis not present

## 2022-08-08 DIAGNOSIS — R2681 Unsteadiness on feet: Secondary | ICD-10-CM | POA: Diagnosis not present

## 2022-08-08 DIAGNOSIS — I10 Essential (primary) hypertension: Secondary | ICD-10-CM | POA: Diagnosis not present

## 2022-08-08 DIAGNOSIS — J449 Chronic obstructive pulmonary disease, unspecified: Secondary | ICD-10-CM | POA: Diagnosis not present

## 2022-08-08 LAB — VITAMIN B12: Vitamin B-12: 599

## 2022-08-09 DIAGNOSIS — J189 Pneumonia, unspecified organism: Secondary | ICD-10-CM | POA: Diagnosis not present

## 2022-08-09 DIAGNOSIS — M6281 Muscle weakness (generalized): Secondary | ICD-10-CM | POA: Diagnosis not present

## 2022-08-10 DIAGNOSIS — F02A4 Dementia in other diseases classified elsewhere, mild, with anxiety: Secondary | ICD-10-CM | POA: Diagnosis not present

## 2022-08-10 DIAGNOSIS — M6281 Muscle weakness (generalized): Secondary | ICD-10-CM | POA: Diagnosis not present

## 2022-08-10 DIAGNOSIS — R278 Other lack of coordination: Secondary | ICD-10-CM | POA: Diagnosis not present

## 2022-08-10 DIAGNOSIS — R2681 Unsteadiness on feet: Secondary | ICD-10-CM | POA: Diagnosis not present

## 2022-08-11 DIAGNOSIS — F324 Major depressive disorder, single episode, in partial remission: Secondary | ICD-10-CM | POA: Diagnosis not present

## 2022-08-11 DIAGNOSIS — R4701 Aphasia: Secondary | ICD-10-CM | POA: Diagnosis not present

## 2022-08-11 DIAGNOSIS — J449 Chronic obstructive pulmonary disease, unspecified: Secondary | ICD-10-CM | POA: Diagnosis not present

## 2022-08-11 DIAGNOSIS — K219 Gastro-esophageal reflux disease without esophagitis: Secondary | ICD-10-CM | POA: Diagnosis not present

## 2022-08-11 DIAGNOSIS — I10 Essential (primary) hypertension: Secondary | ICD-10-CM | POA: Diagnosis not present

## 2022-08-11 DIAGNOSIS — R2681 Unsteadiness on feet: Secondary | ICD-10-CM | POA: Diagnosis not present

## 2022-08-11 DIAGNOSIS — M6281 Muscle weakness (generalized): Secondary | ICD-10-CM | POA: Diagnosis not present

## 2022-08-11 DIAGNOSIS — R278 Other lack of coordination: Secondary | ICD-10-CM | POA: Diagnosis not present

## 2022-08-11 DIAGNOSIS — F02A4 Dementia in other diseases classified elsewhere, mild, with anxiety: Secondary | ICD-10-CM | POA: Diagnosis not present

## 2022-08-11 DIAGNOSIS — R41841 Cognitive communication deficit: Secondary | ICD-10-CM | POA: Diagnosis not present

## 2022-08-12 DIAGNOSIS — J189 Pneumonia, unspecified organism: Secondary | ICD-10-CM | POA: Diagnosis not present

## 2022-08-12 DIAGNOSIS — M6281 Muscle weakness (generalized): Secondary | ICD-10-CM | POA: Diagnosis not present

## 2022-08-15 DIAGNOSIS — R278 Other lack of coordination: Secondary | ICD-10-CM | POA: Diagnosis not present

## 2022-08-15 DIAGNOSIS — R2681 Unsteadiness on feet: Secondary | ICD-10-CM | POA: Diagnosis not present

## 2022-08-15 DIAGNOSIS — M6281 Muscle weakness (generalized): Secondary | ICD-10-CM | POA: Diagnosis not present

## 2022-08-15 DIAGNOSIS — F02A4 Dementia in other diseases classified elsewhere, mild, with anxiety: Secondary | ICD-10-CM | POA: Diagnosis not present

## 2022-08-17 DIAGNOSIS — J449 Chronic obstructive pulmonary disease, unspecified: Secondary | ICD-10-CM | POA: Diagnosis not present

## 2022-08-17 DIAGNOSIS — M6281 Muscle weakness (generalized): Secondary | ICD-10-CM | POA: Diagnosis not present

## 2022-08-17 DIAGNOSIS — J189 Pneumonia, unspecified organism: Secondary | ICD-10-CM | POA: Diagnosis not present

## 2022-08-17 DIAGNOSIS — R41841 Cognitive communication deficit: Secondary | ICD-10-CM | POA: Diagnosis not present

## 2022-08-17 DIAGNOSIS — R278 Other lack of coordination: Secondary | ICD-10-CM | POA: Diagnosis not present

## 2022-08-17 DIAGNOSIS — R4701 Aphasia: Secondary | ICD-10-CM | POA: Diagnosis not present

## 2022-08-17 DIAGNOSIS — F02A4 Dementia in other diseases classified elsewhere, mild, with anxiety: Secondary | ICD-10-CM | POA: Diagnosis not present

## 2022-08-17 DIAGNOSIS — K219 Gastro-esophageal reflux disease without esophagitis: Secondary | ICD-10-CM | POA: Diagnosis not present

## 2022-08-17 DIAGNOSIS — R2681 Unsteadiness on feet: Secondary | ICD-10-CM | POA: Diagnosis not present

## 2022-08-17 DIAGNOSIS — I10 Essential (primary) hypertension: Secondary | ICD-10-CM | POA: Diagnosis not present

## 2022-08-17 DIAGNOSIS — F324 Major depressive disorder, single episode, in partial remission: Secondary | ICD-10-CM | POA: Diagnosis not present

## 2022-08-18 DIAGNOSIS — F02A4 Dementia in other diseases classified elsewhere, mild, with anxiety: Secondary | ICD-10-CM | POA: Diagnosis not present

## 2022-08-18 DIAGNOSIS — R278 Other lack of coordination: Secondary | ICD-10-CM | POA: Diagnosis not present

## 2022-08-18 DIAGNOSIS — M6281 Muscle weakness (generalized): Secondary | ICD-10-CM | POA: Diagnosis not present

## 2022-08-18 DIAGNOSIS — R2681 Unsteadiness on feet: Secondary | ICD-10-CM | POA: Diagnosis not present

## 2022-08-18 DIAGNOSIS — J189 Pneumonia, unspecified organism: Secondary | ICD-10-CM | POA: Diagnosis not present

## 2022-08-19 DIAGNOSIS — J449 Chronic obstructive pulmonary disease, unspecified: Secondary | ICD-10-CM | POA: Diagnosis not present

## 2022-08-19 DIAGNOSIS — F02A4 Dementia in other diseases classified elsewhere, mild, with anxiety: Secondary | ICD-10-CM | POA: Diagnosis not present

## 2022-08-19 DIAGNOSIS — I10 Essential (primary) hypertension: Secondary | ICD-10-CM | POA: Diagnosis not present

## 2022-08-19 DIAGNOSIS — R278 Other lack of coordination: Secondary | ICD-10-CM | POA: Diagnosis not present

## 2022-08-19 DIAGNOSIS — R41841 Cognitive communication deficit: Secondary | ICD-10-CM | POA: Diagnosis not present

## 2022-08-19 DIAGNOSIS — K219 Gastro-esophageal reflux disease without esophagitis: Secondary | ICD-10-CM | POA: Diagnosis not present

## 2022-08-19 DIAGNOSIS — R4701 Aphasia: Secondary | ICD-10-CM | POA: Diagnosis not present

## 2022-08-19 DIAGNOSIS — F324 Major depressive disorder, single episode, in partial remission: Secondary | ICD-10-CM | POA: Diagnosis not present

## 2022-08-20 ENCOUNTER — Other Ambulatory Visit: Payer: Self-pay

## 2022-08-20 ENCOUNTER — Emergency Department (HOSPITAL_COMMUNITY): Payer: Medicare HMO

## 2022-08-20 ENCOUNTER — Emergency Department (HOSPITAL_COMMUNITY)
Admission: EM | Admit: 2022-08-20 | Discharge: 2022-08-20 | Disposition: A | Discharge status: Home / Self Care | Payer: Medicare HMO | Attending: Emergency Medicine | Admitting: Emergency Medicine

## 2022-08-20 DIAGNOSIS — S0180XA Unspecified open wound of other part of head, initial encounter: Secondary | ICD-10-CM | POA: Diagnosis not present

## 2022-08-20 DIAGNOSIS — S0990XA Unspecified injury of head, initial encounter: Secondary | ICD-10-CM | POA: Diagnosis not present

## 2022-08-20 DIAGNOSIS — F0390 Unspecified dementia without behavioral disturbance: Secondary | ICD-10-CM | POA: Diagnosis not present

## 2022-08-20 DIAGNOSIS — Y92009 Unspecified place in unspecified non-institutional (private) residence as the place of occurrence of the external cause: Secondary | ICD-10-CM | POA: Diagnosis not present

## 2022-08-20 DIAGNOSIS — R9089 Other abnormal findings on diagnostic imaging of central nervous system: Secondary | ICD-10-CM | POA: Diagnosis not present

## 2022-08-20 DIAGNOSIS — W19XXXA Unspecified fall, initial encounter: Secondary | ICD-10-CM | POA: Diagnosis not present

## 2022-08-20 DIAGNOSIS — S0181XA Laceration without foreign body of other part of head, initial encounter: Secondary | ICD-10-CM

## 2022-08-20 DIAGNOSIS — J321 Chronic frontal sinusitis: Secondary | ICD-10-CM | POA: Diagnosis not present

## 2022-08-20 DIAGNOSIS — W06XXXA Fall from bed, initial encounter: Secondary | ICD-10-CM | POA: Insufficient documentation

## 2022-08-20 NOTE — ED Provider Notes (Signed)
  Physical Exam  BP (!) 147/82 (BP Location: Right Arm)   Pulse 66   Temp 98.2 F (36.8 C) (Oral)   Resp 18   SpO2 94%   Physical Exam Vitals and nursing note reviewed.  Constitutional:      Appearance: Normal appearance.  HENT:     Head: Normocephalic and atraumatic.      Comments: Linear laceration about 1.5 cm to the forehead, minimal bleeding Eyes:     Conjunctiva/sclera: Conjunctivae normal.  Pulmonary:     Effort: Pulmonary effort is normal. No respiratory distress.  Skin:    General: Skin is warm and dry.  Neurological:     Mental Status: She is alert.  Psychiatric:        Mood and Affect: Mood normal.        Behavior: Behavior normal.    Procedures  .Marland KitchenLaceration Repair  Date/Time: 08/20/2022 10:41 AM  Performed by: Su Monks, PA-C Authorized by: Su Monks, PA-C   Consent:    Consent obtained:  Verbal   Consent given by:  Patient and healthcare agent   Risks, benefits, and alternatives were discussed: yes     Risks discussed:  Infection, pain and poor wound healing Universal protocol:    Procedure explained and questions answered to patient or proxy's satisfaction: yes     Patient identity confirmed:  Provided demographic data Anesthesia:    Anesthesia method:  None Laceration details:    Location:  Face   Face location:  Forehead   Length (cm):  1.5 Pre-procedure details:    Preparation:  Patient was prepped and draped in usual sterile fashion Exploration:    Limited defect created (wound extended): no     Hemostasis achieved with:  Direct pressure   Imaging obtained comment:  CT   Imaging outcome: foreign body not noted     Wound exploration: entire depth of wound visualized   Treatment:    Area cleansed with:  Saline   Amount of cleaning:  Standard   Visualized foreign bodies/material removed: no     Debridement:  None   Undermining:  None   Scar revision: no   Skin repair:    Repair method:  Tissue adhesive Approximation:     Approximation:  Close Repair type:    Repair type:  Simple Post-procedure details:    Dressing:  Non-adherent dressing   Procedure completion:  Tolerated well, no immediate complications    ,  T, PA-C 08/20/22 1042    Lorre Nick, MD 08/21/22 0710

## 2022-08-20 NOTE — ED Triage Notes (Signed)
Pt arrived via [facility] from [facility]. Pt slipped and fell face down when she was getting out of bed. She has a small laceration on her head. She also has a hx of dementia.  BP 132/80 P74 O2 96RA

## 2022-08-20 NOTE — Discharge Instructions (Addendum)
You were seen in the emergency department for fall with facial laceration.   We were able to close your laceration with skin glue. The adhesive should peel off in about 5 to 10 days.  If it comes off sooner that is okay.  If it lasts longer than that, you can use some Vaseline to help it come off on its own.  With the glue, you may shower, but do not soak or scrub the area for 7 to 10 days.  Then make sure that you pat the area dry.   If you want to wear a bandage over the area that is fine as well, but make sure it is clean/dry (has no ointment on it).  Watch out for signs of infection like we discussed, including: increased redness, tenderness, or drainage of pus from the site. If this happens and you were not prescribed antibiotics, please seek medical attention.   You can take over the counter pain medicine like ibuprofen or tylenol as needed.   Your CT scans were reassuring. There was no evidence of broken bones or internal bleeding.

## 2022-08-20 NOTE — ED Provider Notes (Signed)
[facility] EMERGENCY DEPARTMENT AT [facility] Provider Note   CSN: [id number] Arrival date & time: 08/20/22  4098     History  Chief Complaint  Patient presents with   [name] [name]    MALIAH [name] is a 89 y.o. female.  89 year old female with history dementia here after mechanical fall just prior to arrival.  Patient states he slipped out of bed and fell onto her face.  No loss of consciousness.  States she did sustain a laceration to her forehead.  Denies any headache, neck pain.  No weakness in arms or legs.  EMS was called and patient transported here       Home Medications Prior to Admission medications   Medication Sig Start Date End Date Taking? Authorizing Provider  atorvastatin (LIPITOR) 20 MG tablet Take 20 mg by mouth daily.    [provider]  Cyanocobalamin (VITAMIN B 12) 500 MCG TABS Take 1,000 mg by mouth daily.    [provider]  Cyanocobalamin 1000 MCG/ML KIT Inject 1,000 mcg into the skin See admin instructions. Inject 1,000 mcg into the skin every 8 weeks    [provider]  escitalopram (LEXAPRO) 20 MG tablet Take 20 mg by mouth daily.    [provider]  feeding supplement (ENSURE ENLIVE / ENSURE PLUS) LIQD Take 237 mLs by mouth 3 (three) times daily between meals. 05/16/22   Osvaldo Shipper, MD  galantamine (RAZADYNE ER) 16 MG 24 hr capsule Take 16 mg by mouth daily with breakfast. 04/08/22   [provider]  memantine (NAMENDA XR) 28 MG CP24 24 hr capsule Take 1 capsule (28 mg total) by mouth daily. 05/16/22   Osvaldo Shipper, MD  OXYGEN Inhale 2 L into the lungs daily.    [provider]  pantoprazole (PROTONIX) 40 MG tablet Take 1 tablet (40 mg total) by mouth daily. 05/16/22   Osvaldo Shipper, MD  polyethylene glycol (MIRALAX / GLYCOLAX) 17 g packet Take 17 g by mouth daily as needed for mild constipation. 05/16/22   Osvaldo Shipper, MD  Probiotic Product (ALIGN) CHEW Chew 2 tablets by mouth daily.     [provider]      Allergies    Albuterol, Diltiazem, Naproxen, Other, Cardizem [diltiazem hcl], and Nitrofurantoin    Review of Systems   Review of Systems  All other systems reviewed and are negative.   Physical Exam Updated Vital Signs BP (!) 147/82 (BP Location: Right Arm)   Pulse 66   Temp 98.2 F (36.8 C) (Oral)   Resp 18   SpO2 94%  Physical Exam Vitals and nursing note reviewed.  Constitutional:      General: She is not in acute distress.    Appearance: Normal appearance. She is well-developed. She is not toxic-appearing.  HENT:     Head:   Eyes:     General: Lids are normal.     Conjunctiva/sclera: Conjunctivae normal.     Pupils: Pupils are equal, round, and reactive to light.  Neck:     Thyroid: No thyroid mass.     Trachea: No tracheal deviation.  Cardiovascular:     Rate and Rhythm: Normal rate and regular rhythm.     Heart sounds: Normal heart sounds. No murmur heard.    No gallop.  Pulmonary:     Effort: Pulmonary effort is normal. No respiratory distress.     Breath sounds: Normal breath sounds. No stridor. No decreased breath sounds, wheezing, rhonchi or rales.  Abdominal:  General: There is no distension.     Palpations: Abdomen is soft.     Tenderness: There is no abdominal tenderness. There is no rebound.  Musculoskeletal:        General: No tenderness. Normal range of motion.     Cervical back: Normal range of motion and neck supple.  Skin:    General: Skin is warm and dry.     Findings: No abrasion or rash.  Neurological:     General: No focal deficit present.     Mental Status: She is alert and oriented to person, place, and time. Mental status is at baseline.     GCS: GCS eye subscore is 4. GCS verbal subscore is 5. GCS motor subscore is 6.     Cranial Nerves: Cranial nerves are intact. No cranial nerve deficit.     Sensory: No sensory deficit.     Motor: Motor function is intact.  Psychiatric:        Attention and  Perception: Attention normal.        Speech: Speech normal.        Behavior: Behavior normal.     ED Results / Procedures / Treatments   Labs (all labs ordered are listed, but only abnormal results are displayed) Labs Reviewed - No data to display  EKG None  Radiology No results found.  Procedures Procedures    Medications Ordered in ED Medications - No data to display  ED Course/ Medical Decision Making/ A&P                                 Medical Decision Making Amount and/or Complexity of Data Reviewed Radiology: ordered.   Laceration repaired by physician assistant.  CT head and neck per my interpretation showed no acute findings.  Patient neurological baseline.  Will discharge        Final Clinical Impression(s) / ED Diagnoses Final diagnoses:  None    Rx / DC Orders ED Discharge Orders     None         Lorre Nick, MD 08/20/22 1044

## 2022-08-22 ENCOUNTER — Ambulatory Visit: Payer: Medicare HMO | Admitting: Pulmonary Disease

## 2022-08-22 ENCOUNTER — Non-Acute Institutional Stay: Payer: Medicare HMO | Admitting: Orthopedic Surgery

## 2022-08-22 ENCOUNTER — Encounter: Payer: Self-pay | Admitting: Orthopedic Surgery

## 2022-08-22 DIAGNOSIS — R278 Other lack of coordination: Secondary | ICD-10-CM | POA: Diagnosis not present

## 2022-08-22 DIAGNOSIS — F02A4 Dementia in other diseases classified elsewhere, mild, with anxiety: Secondary | ICD-10-CM | POA: Diagnosis not present

## 2022-08-22 DIAGNOSIS — W19XXXD Unspecified fall, subsequent encounter: Secondary | ICD-10-CM | POA: Diagnosis not present

## 2022-08-22 DIAGNOSIS — S0181XD Laceration without foreign body of other part of head, subsequent encounter: Secondary | ICD-10-CM | POA: Diagnosis not present

## 2022-08-22 DIAGNOSIS — Y92129 Unspecified place in nursing home as the place of occurrence of the external cause: Secondary | ICD-10-CM | POA: Diagnosis not present

## 2022-08-22 DIAGNOSIS — I10 Essential (primary) hypertension: Secondary | ICD-10-CM | POA: Diagnosis not present

## 2022-08-22 DIAGNOSIS — J449 Chronic obstructive pulmonary disease, unspecified: Secondary | ICD-10-CM | POA: Diagnosis not present

## 2022-08-22 DIAGNOSIS — F324 Major depressive disorder, single episode, in partial remission: Secondary | ICD-10-CM | POA: Diagnosis not present

## 2022-08-22 DIAGNOSIS — K219 Gastro-esophageal reflux disease without esophagitis: Secondary | ICD-10-CM | POA: Diagnosis not present

## 2022-08-22 DIAGNOSIS — R41841 Cognitive communication deficit: Secondary | ICD-10-CM | POA: Diagnosis not present

## 2022-08-22 DIAGNOSIS — R2681 Unsteadiness on feet: Secondary | ICD-10-CM | POA: Diagnosis not present

## 2022-08-22 DIAGNOSIS — M6281 Muscle weakness (generalized): Secondary | ICD-10-CM | POA: Diagnosis not present

## 2022-08-22 DIAGNOSIS — R4701 Aphasia: Secondary | ICD-10-CM | POA: Diagnosis not present

## 2022-08-22 NOTE — Progress Notes (Signed)
Location:  [facility] Room Number: 9-A Place of Service:  ALF [phone]) Provider: Hazle Nordmann, NP  Code Status: DNR Goals of Care:     08/22/2022    9:47 AM  Advanced Directives  Does Patient Have a Medical Advance Directive? Yes  Type of Advance Directive Out of facility DNR (pink MOST or yellow form)  Does patient want to make changes to medical advance directive? No - Patient declined     Chief Complaint  Patient presents with   Hospitalization Follow-up    Hospital follow up due to head injury.     HPI: Patient is a 89 y.o. female seen today s/p ED visit 08/03 due to mechanical fall.   She currently resides on the assisted living unit at [facility]. PMH: HTN, COPD, GERD, constipation, dementia, OA, breast cancer, anemia and depression.   08/03 she slipped out of bed and hit her head. She had a deep laceration to forehead and headache after incident. Not on anticoagulation. She was sent to ED for evaluation. CT head/spine with no acute intracranial or cervical spine injury. Head laceration was closed with dermabond. She was discharged back to [facility].   Today, she denies dizziness, headaches, N/V. She has not been using socks with anti-skid soles for falls prevention. She is ambulating without difficulty. Denies pain to extremities. Afebrile. Vital stable.   Past Medical History:  Diagnosis Date   Anxiety    Bradycardia    asymptomatic   Breast CA (HCC) 1993 OR 1994   LEFT, SURGERY AND RADIATION DONE   COPD (chronic obstructive pulmonary disease) (HCC)    MILD, NO INHALERS USED   DEGENERATIVE JOINT DISEASE, KNEE    Family history of anesthesia complication    NEPHEW NAUSEA/VOMITING   GENU VALGUM    Hyperlipidemia    Hypertension    Numbness of leg 2011   just left shin   OTHER ACQUIRED DEFORMITY OF ANKLE AND FOOT OTHER    Pneumonia 2013   X 2   PVC's (premature ventricular contractions)    ROTATOR CUFF SYNDROME, LEFT    CANNOT LIFT LEFT ARM  ALL THE WAY UP   UNEQUAL LEG LENGTH    UTI (urinary tract infection)    STARTED AUGMENTIN  ON 09-10-13    Past Surgical History:  Procedure Laterality Date   APPENDECTOMY  AGE 51   BREAST LUMPECTOMY Left 1991   radiation   BREAST SURGERY Left 1993 OR 1994   LUMPECTOMY AND RADIATION DONE   JOINT REPLACEMENT     NASAL SINUS SURGERY  2006   REVERSE SHOULDER ARTHROPLASTY Left 07/31/2015   Procedure: LEFT REVERSE SHOULDER ARTHROPLASTY;  Surgeon: Beverely Low, MD;  Location: [facility] OR;  Service: Orthopedics;  Laterality: Left;   TOTAL HIP ARTHROPLASTY Bilateral LEFT 2008 AND RIGHT 2010   TOTAL KNEE ARTHROPLASTY Left 09/17/2013   Procedure: LEFT TOTAL KNEE ARTHROPLASTY;  Surgeon: Shelda Pal, MD;  Location: [facility] ORS;  Service: Orthopedics;  Laterality: Left;    Allergies  Allergen Reactions   Albuterol Other (See Comments)    "Had some rapid heartbeat in 2013 when she took it for some bronchitis"   Diltiazem Other (See Comments)    "Pre-syncope, bp went a bit low for bp on 180 mg dose"   Naproxen Swelling   Other Nausea Only and Other (See Comments)    AN UNNAMED PAIN MEDICATION CAUSED NAUSEA   Cardizem [Diltiazem Hcl] Other (See Comments)    Hypotension  Nitrofurantoin Rash and Other (See Comments)    Sept., 2018      Outpatient Encounter Medications as of 08/22/2022  Medication Sig   acetaminophen (TYLENOL) 325 MG tablet Take 650 mg by mouth every 4 (four) hours as needed.   atorvastatin (LIPITOR) 20 MG tablet Take 20 mg by mouth daily.   Cyanocobalamin (VITAMIN B 12) 500 MCG TABS Take 1,000 mg by mouth daily.   Cyanocobalamin 1000 MCG/ML KIT Inject 1,000 mcg into the skin See admin instructions. Inject 1,000 mcg into the skin every 8 weeks   escitalopram (LEXAPRO) 20 MG tablet Take 20 mg by mouth daily.   galantamine (RAZADYNE ER) 16 MG 24 hr capsule Take 16 mg by mouth daily with breakfast.   memantine (NAMENDA XR) 28 MG CP24 24 hr capsule Take 1 capsule (28 mg total) by mouth  daily.   omeprazole (PRILOSEC) 20 MG capsule Take 20 mg by mouth daily.   polyethylene glycol (MIRALAX / GLYCOLAX) 17 g packet Take 17 g by mouth daily as needed for mild constipation.   Probiotic Product (ALIGN) CHEW Chew 2 tablets by mouth daily.   [DISCONTINUED] feeding supplement (ENSURE ENLIVE / ENSURE PLUS) LIQD Take 237 mLs by mouth 3 (three) times daily between meals.   [DISCONTINUED] OXYGEN Inhale 2 L into the lungs daily.   [DISCONTINUED] pantoprazole (PROTONIX) 40 MG tablet Take 1 tablet (40 mg total) by mouth daily.   No facility-administered encounter medications on file as of 08/22/2022.    Review of Systems:  Review of Systems  Constitutional:  Negative for activity change and appetite change.  Eyes:  Negative for visual disturbance.  Respiratory:  Negative for cough, shortness of breath and wheezing.   Cardiovascular:  Negative for chest pain and leg swelling.  Gastrointestinal:  Negative for nausea and vomiting.  Musculoskeletal:  Positive for gait problem.  Neurological:  Negative for dizziness and headaches.  Psychiatric/Behavioral:  Positive for confusion. Negative for dysphoric mood. The patient is not nervous/anxious.     Health Maintenance  Topic Date Due   COVID-19 Vaccine (1) 11/30/2020   INFLUENZA VACCINE  08/18/2022   Medicare Annual Wellness (AWV)  10/13/2022   DTaP/Tdap/Td (6 - Td or Tdap) 05/10/2025   Pneumonia Vaccine 31+ Years old  Completed   DEXA SCAN  Completed   Zoster Vaccines- Shingrix  Completed   HPV VACCINES  Aged Out    Physical Exam: Vitals:   08/22/22 0938  BP: 132/76  Pulse: 70  Resp: (!) 22  Temp: 97.9 F (36.6 C)  SpO2: 92%  Weight: 120 lb (54.4 kg)  Height: 5\' 4"  (1.626 m)   Body mass index is 20.6 kg/m. Physical Exam Vitals reviewed.  Constitutional:      General: She is not in acute distress. HENT:     Head: Normocephalic.     Comments: 1.5 cm linear laceration to middle forehead, closed with dermabond, no sign of  infection or swelling Eyes:     General:        Right eye: No discharge.        Left eye: No discharge.     Extraocular Movements: Extraocular movements intact.     Pupils: Pupils are equal, round, and reactive to light.  Cardiovascular:     Rate and Rhythm: Normal rate and regular rhythm.     Pulses: Normal pulses.     Heart sounds: Normal heart sounds.  Pulmonary:     Effort: Pulmonary effort is normal. No respiratory distress.  Breath sounds: Normal breath sounds. No wheezing.  Musculoskeletal:     Cervical back: Neck supple.     Comments: Able to move extremities without difficulty  Skin:    General: Skin is warm.     Capillary Refill: Capillary refill takes less than 2 seconds.  Neurological:     Mental Status: She is alert.     Motor: No weakness.     Gait: Gait normal.  Psychiatric:        Mood and Affect: Mood normal.     Labs reviewed: Basic Metabolic Panel: Recent Labs    05/10/22 0452 05/13/22 0414 05/15/22 0414 05/19/22 0000  NA 132* 134* 132* 137  K 3.6 3.8 4.3 4.1  CL 99 100 98 101  CO2 24 27 23  29*  GLUCOSE 93 96 101*  --   BUN 15 11 12 11   CREATININE 0.78 0.74 0.78 0.8  CALCIUM 8.2* 8.3* 8.7*  --   PHOS  --   --  2.8  --    Liver Function Tests: Recent Labs    05/09/22 1205 05/15/22 0414  AST 26  --   ALT 18  --   ALKPHOS 84  --   BILITOT 0.9  --   PROT 7.3  --   ALBUMIN 2.7* 2.6*   No results for input(s): "LIPASE", "AMYLASE" in the last 8760 hours. No results for input(s): "AMMONIA" in the last 8760 hours. CBC: Recent Labs    05/09/22 1525 05/10/22 0452 05/13/22 0414 05/15/22 0414 05/19/22 0000  WBC 10.1 7.9 6.3 8.8 7.0  NEUTROABS 7.9*  --   --  5.3 4,515.00  HGB 12.8 10.8* 10.8* 11.2* 10.1*  HCT 39.8 32.9* 33.5* 34.4* 31*  MCV 99.0 99.1 98.8 97.5  --   PLT 268 201 198 263 228   Lipid Panel: No results for input(s): "CHOL", "HDL", "LDLCALC", "TRIG", "CHOLHDL", "LDLDIRECT" in the last 8760 hours. No results found for:  "HGBA1C"  Procedures since last visit: CT Head Wo Contrast  Result Date: 08/20/2022 CLINICAL DATA:  Fall getting out of bed with small laceration EXAM: CT HEAD WITHOUT CONTRAST CT CERVICAL SPINE WITHOUT CONTRAST TECHNIQUE: Multidetector CT imaging of the head and cervical spine was performed following the standard protocol without intravenous contrast. Multiplanar CT image reconstructions of the cervical spine were also generated. RADIATION DOSE REDUCTION: This exam was performed according to the departmental dose-optimization program which includes automated exposure control, adjustment of the mA and/or kV according to patient size and/or use of iterative reconstruction technique. COMPARISON:  None Available. FINDINGS: CT HEAD FINDINGS Brain: No evidence of acute infarction, hemorrhage, hydrocephalus, extra-axial collection or mass lesion/mass effect. Low-density in the cerebral white matter that is mild for age. Mild cerebral volume loss as well. Vascular: No hyperdense vessel or unexpected calcification. Skull: Mild forehead swelling without acute fracture. Sinuses/Orbits: Chronic frontal sinusitis with complete opacification. Some scalloping seen medially on the left; no cortical breakthrough. Endoscopic sinus surgery with patent openings. Negative orbits. CT CERVICAL SPINE FINDINGS Alignment: No traumatic malalignment. Mild C7-T1 degenerative anterolisthesis. Skull base and vertebrae: No acute fracture. No primary bone lesion or focal pathologic process. Soft tissues and spinal canal: No prevertebral fluid or swelling. No visible canal hematoma. Disc levels:  Generalized degenerative endplate and facet spurring. Upper chest: Mild opacity in the right upper lobe attributed to scarring/interstitial lung disease based on 06/14/2022 chest CT. IMPRESSION: No evidence of acute intracranial or cervical spine injury. Electronically Signed   By: Audry Riles.D.  On: 08/20/2022 10:21   CT Cervical Spine Wo  Contrast  Result Date: 08/20/2022 CLINICAL DATA:  Fall getting out of bed with small laceration EXAM: CT HEAD WITHOUT CONTRAST CT CERVICAL SPINE WITHOUT CONTRAST TECHNIQUE: Multidetector CT imaging of the head and cervical spine was performed following the standard protocol without intravenous contrast. Multiplanar CT image reconstructions of the cervical spine were also generated. RADIATION DOSE REDUCTION: This exam was performed according to the departmental dose-optimization program which includes automated exposure control, adjustment of the mA and/or kV according to patient size and/or use of iterative reconstruction technique. COMPARISON:  None Available. FINDINGS: CT HEAD FINDINGS Brain: No evidence of acute infarction, hemorrhage, hydrocephalus, extra-axial collection or mass lesion/mass effect. Low-density in the cerebral white matter that is mild for age. Mild cerebral volume loss as well. Vascular: No hyperdense vessel or unexpected calcification. Skull: Mild forehead swelling without acute fracture. Sinuses/Orbits: Chronic frontal sinusitis with complete opacification. Some scalloping seen medially on the left; no cortical breakthrough. Endoscopic sinus surgery with patent openings. Negative orbits. CT CERVICAL SPINE FINDINGS Alignment: No traumatic malalignment. Mild C7-T1 degenerative anterolisthesis. Skull base and vertebrae: No acute fracture. No primary bone lesion or focal pathologic process. Soft tissues and spinal canal: No prevertebral fluid or swelling. No visible canal hematoma. Disc levels:  Generalized degenerative endplate and facet spurring. Upper chest: Mild opacity in the right upper lobe attributed to scarring/interstitial lung disease based on 06/14/2022 chest CT. IMPRESSION: No evidence of acute intracranial or cervical spine injury. Electronically Signed   By: Tiburcio Pea M.D.   On: 08/20/2022 10:21    Assessment/Plan: 1. Laceration of other part of head without foreign  body, subsequent encounter - 08/03 mechanical fall - ED evaluation due to head laceration and headache - 1.5 cm linear laceration closed with dermabond - cont tylenol prn for pain   2. Fall at nursing home, subsequent encounter - see above - cont non-skin socks - discussed falls prevention     Labs/tests ordered:  none Next appt:  Visit date not found

## 2022-08-23 DIAGNOSIS — R278 Other lack of coordination: Secondary | ICD-10-CM | POA: Diagnosis not present

## 2022-08-23 DIAGNOSIS — M6281 Muscle weakness (generalized): Secondary | ICD-10-CM | POA: Diagnosis not present

## 2022-08-23 DIAGNOSIS — R2681 Unsteadiness on feet: Secondary | ICD-10-CM | POA: Diagnosis not present

## 2022-08-23 DIAGNOSIS — F02A4 Dementia in other diseases classified elsewhere, mild, with anxiety: Secondary | ICD-10-CM | POA: Diagnosis not present

## 2022-08-23 DIAGNOSIS — J189 Pneumonia, unspecified organism: Secondary | ICD-10-CM | POA: Diagnosis not present

## 2022-08-24 DIAGNOSIS — J449 Chronic obstructive pulmonary disease, unspecified: Secondary | ICD-10-CM | POA: Diagnosis not present

## 2022-08-24 DIAGNOSIS — F02A4 Dementia in other diseases classified elsewhere, mild, with anxiety: Secondary | ICD-10-CM | POA: Diagnosis not present

## 2022-08-24 DIAGNOSIS — R4701 Aphasia: Secondary | ICD-10-CM | POA: Diagnosis not present

## 2022-08-24 DIAGNOSIS — R41841 Cognitive communication deficit: Secondary | ICD-10-CM | POA: Diagnosis not present

## 2022-08-24 DIAGNOSIS — I10 Essential (primary) hypertension: Secondary | ICD-10-CM | POA: Diagnosis not present

## 2022-08-24 DIAGNOSIS — K219 Gastro-esophageal reflux disease without esophagitis: Secondary | ICD-10-CM | POA: Diagnosis not present

## 2022-08-24 DIAGNOSIS — R278 Other lack of coordination: Secondary | ICD-10-CM | POA: Diagnosis not present

## 2022-08-24 DIAGNOSIS — F324 Major depressive disorder, single episode, in partial remission: Secondary | ICD-10-CM | POA: Diagnosis not present

## 2022-08-25 DIAGNOSIS — F02A4 Dementia in other diseases classified elsewhere, mild, with anxiety: Secondary | ICD-10-CM | POA: Diagnosis not present

## 2022-08-25 DIAGNOSIS — M6281 Muscle weakness (generalized): Secondary | ICD-10-CM | POA: Diagnosis not present

## 2022-08-25 DIAGNOSIS — R2681 Unsteadiness on feet: Secondary | ICD-10-CM | POA: Diagnosis not present

## 2022-08-25 DIAGNOSIS — R278 Other lack of coordination: Secondary | ICD-10-CM | POA: Diagnosis not present

## 2022-08-26 DIAGNOSIS — J189 Pneumonia, unspecified organism: Secondary | ICD-10-CM | POA: Diagnosis not present

## 2022-08-26 DIAGNOSIS — M6281 Muscle weakness (generalized): Secondary | ICD-10-CM | POA: Diagnosis not present

## 2022-08-29 DIAGNOSIS — F324 Major depressive disorder, single episode, in partial remission: Secondary | ICD-10-CM | POA: Diagnosis not present

## 2022-08-29 DIAGNOSIS — J449 Chronic obstructive pulmonary disease, unspecified: Secondary | ICD-10-CM | POA: Diagnosis not present

## 2022-08-29 DIAGNOSIS — K219 Gastro-esophageal reflux disease without esophagitis: Secondary | ICD-10-CM | POA: Diagnosis not present

## 2022-08-29 DIAGNOSIS — R2681 Unsteadiness on feet: Secondary | ICD-10-CM | POA: Diagnosis not present

## 2022-08-29 DIAGNOSIS — I10 Essential (primary) hypertension: Secondary | ICD-10-CM | POA: Diagnosis not present

## 2022-08-29 DIAGNOSIS — R4701 Aphasia: Secondary | ICD-10-CM | POA: Diagnosis not present

## 2022-08-29 DIAGNOSIS — F02A4 Dementia in other diseases classified elsewhere, mild, with anxiety: Secondary | ICD-10-CM | POA: Diagnosis not present

## 2022-08-29 DIAGNOSIS — R41841 Cognitive communication deficit: Secondary | ICD-10-CM | POA: Diagnosis not present

## 2022-08-29 DIAGNOSIS — R278 Other lack of coordination: Secondary | ICD-10-CM | POA: Diagnosis not present

## 2022-08-29 DIAGNOSIS — M6281 Muscle weakness (generalized): Secondary | ICD-10-CM | POA: Diagnosis not present

## 2022-08-30 DIAGNOSIS — J189 Pneumonia, unspecified organism: Secondary | ICD-10-CM | POA: Diagnosis not present

## 2022-08-30 DIAGNOSIS — R2681 Unsteadiness on feet: Secondary | ICD-10-CM | POA: Diagnosis not present

## 2022-08-30 DIAGNOSIS — R278 Other lack of coordination: Secondary | ICD-10-CM | POA: Diagnosis not present

## 2022-08-30 DIAGNOSIS — F02A4 Dementia in other diseases classified elsewhere, mild, with anxiety: Secondary | ICD-10-CM | POA: Diagnosis not present

## 2022-08-30 DIAGNOSIS — M6281 Muscle weakness (generalized): Secondary | ICD-10-CM | POA: Diagnosis not present

## 2022-08-31 DIAGNOSIS — R2681 Unsteadiness on feet: Secondary | ICD-10-CM | POA: Diagnosis not present

## 2022-08-31 DIAGNOSIS — I10 Essential (primary) hypertension: Secondary | ICD-10-CM | POA: Diagnosis not present

## 2022-08-31 DIAGNOSIS — F02A4 Dementia in other diseases classified elsewhere, mild, with anxiety: Secondary | ICD-10-CM | POA: Diagnosis not present

## 2022-08-31 DIAGNOSIS — M6281 Muscle weakness (generalized): Secondary | ICD-10-CM | POA: Diagnosis not present

## 2022-08-31 DIAGNOSIS — R4701 Aphasia: Secondary | ICD-10-CM | POA: Diagnosis not present

## 2022-08-31 DIAGNOSIS — R41841 Cognitive communication deficit: Secondary | ICD-10-CM | POA: Diagnosis not present

## 2022-08-31 DIAGNOSIS — K219 Gastro-esophageal reflux disease without esophagitis: Secondary | ICD-10-CM | POA: Diagnosis not present

## 2022-08-31 DIAGNOSIS — R278 Other lack of coordination: Secondary | ICD-10-CM | POA: Diagnosis not present

## 2022-08-31 DIAGNOSIS — J449 Chronic obstructive pulmonary disease, unspecified: Secondary | ICD-10-CM | POA: Diagnosis not present

## 2022-08-31 DIAGNOSIS — F324 Major depressive disorder, single episode, in partial remission: Secondary | ICD-10-CM | POA: Diagnosis not present

## 2022-09-01 DIAGNOSIS — R278 Other lack of coordination: Secondary | ICD-10-CM | POA: Diagnosis not present

## 2022-09-01 DIAGNOSIS — F02A4 Dementia in other diseases classified elsewhere, mild, with anxiety: Secondary | ICD-10-CM | POA: Diagnosis not present

## 2022-09-01 DIAGNOSIS — J189 Pneumonia, unspecified organism: Secondary | ICD-10-CM | POA: Diagnosis not present

## 2022-09-01 DIAGNOSIS — M6281 Muscle weakness (generalized): Secondary | ICD-10-CM | POA: Diagnosis not present

## 2022-09-01 DIAGNOSIS — R2681 Unsteadiness on feet: Secondary | ICD-10-CM | POA: Diagnosis not present

## 2022-09-05 DIAGNOSIS — R41841 Cognitive communication deficit: Secondary | ICD-10-CM | POA: Diagnosis not present

## 2022-09-05 DIAGNOSIS — K219 Gastro-esophageal reflux disease without esophagitis: Secondary | ICD-10-CM | POA: Diagnosis not present

## 2022-09-05 DIAGNOSIS — R278 Other lack of coordination: Secondary | ICD-10-CM | POA: Diagnosis not present

## 2022-09-05 DIAGNOSIS — J449 Chronic obstructive pulmonary disease, unspecified: Secondary | ICD-10-CM | POA: Diagnosis not present

## 2022-09-05 DIAGNOSIS — F02A4 Dementia in other diseases classified elsewhere, mild, with anxiety: Secondary | ICD-10-CM | POA: Diagnosis not present

## 2022-09-05 DIAGNOSIS — I10 Essential (primary) hypertension: Secondary | ICD-10-CM | POA: Diagnosis not present

## 2022-09-05 DIAGNOSIS — F324 Major depressive disorder, single episode, in partial remission: Secondary | ICD-10-CM | POA: Diagnosis not present

## 2022-09-05 DIAGNOSIS — R4701 Aphasia: Secondary | ICD-10-CM | POA: Diagnosis not present

## 2022-09-06 DIAGNOSIS — J189 Pneumonia, unspecified organism: Secondary | ICD-10-CM | POA: Diagnosis not present

## 2022-09-06 DIAGNOSIS — R2681 Unsteadiness on feet: Secondary | ICD-10-CM | POA: Diagnosis not present

## 2022-09-06 DIAGNOSIS — R278 Other lack of coordination: Secondary | ICD-10-CM | POA: Diagnosis not present

## 2022-09-06 DIAGNOSIS — M6281 Muscle weakness (generalized): Secondary | ICD-10-CM | POA: Diagnosis not present

## 2022-09-06 DIAGNOSIS — F02A4 Dementia in other diseases classified elsewhere, mild, with anxiety: Secondary | ICD-10-CM | POA: Diagnosis not present

## 2022-09-07 DIAGNOSIS — R2681 Unsteadiness on feet: Secondary | ICD-10-CM | POA: Diagnosis not present

## 2022-09-07 DIAGNOSIS — I10 Essential (primary) hypertension: Secondary | ICD-10-CM | POA: Diagnosis not present

## 2022-09-07 DIAGNOSIS — R41841 Cognitive communication deficit: Secondary | ICD-10-CM | POA: Diagnosis not present

## 2022-09-07 DIAGNOSIS — R4701 Aphasia: Secondary | ICD-10-CM | POA: Diagnosis not present

## 2022-09-07 DIAGNOSIS — F324 Major depressive disorder, single episode, in partial remission: Secondary | ICD-10-CM | POA: Diagnosis not present

## 2022-09-07 DIAGNOSIS — M6281 Muscle weakness (generalized): Secondary | ICD-10-CM | POA: Diagnosis not present

## 2022-09-07 DIAGNOSIS — K219 Gastro-esophageal reflux disease without esophagitis: Secondary | ICD-10-CM | POA: Diagnosis not present

## 2022-09-07 DIAGNOSIS — J449 Chronic obstructive pulmonary disease, unspecified: Secondary | ICD-10-CM | POA: Diagnosis not present

## 2022-09-07 DIAGNOSIS — F02A4 Dementia in other diseases classified elsewhere, mild, with anxiety: Secondary | ICD-10-CM | POA: Diagnosis not present

## 2022-09-07 DIAGNOSIS — R278 Other lack of coordination: Secondary | ICD-10-CM | POA: Diagnosis not present

## 2022-09-08 DIAGNOSIS — R41841 Cognitive communication deficit: Secondary | ICD-10-CM | POA: Diagnosis not present

## 2022-09-08 DIAGNOSIS — J449 Chronic obstructive pulmonary disease, unspecified: Secondary | ICD-10-CM | POA: Diagnosis not present

## 2022-09-08 DIAGNOSIS — F324 Major depressive disorder, single episode, in partial remission: Secondary | ICD-10-CM | POA: Diagnosis not present

## 2022-09-08 DIAGNOSIS — K219 Gastro-esophageal reflux disease without esophagitis: Secondary | ICD-10-CM | POA: Diagnosis not present

## 2022-09-08 DIAGNOSIS — R4701 Aphasia: Secondary | ICD-10-CM | POA: Diagnosis not present

## 2022-09-08 DIAGNOSIS — I10 Essential (primary) hypertension: Secondary | ICD-10-CM | POA: Diagnosis not present

## 2022-09-08 DIAGNOSIS — F02A4 Dementia in other diseases classified elsewhere, mild, with anxiety: Secondary | ICD-10-CM | POA: Diagnosis not present

## 2022-09-08 DIAGNOSIS — R278 Other lack of coordination: Secondary | ICD-10-CM | POA: Diagnosis not present

## 2022-09-09 DIAGNOSIS — J189 Pneumonia, unspecified organism: Secondary | ICD-10-CM | POA: Diagnosis not present

## 2022-09-09 DIAGNOSIS — M6281 Muscle weakness (generalized): Secondary | ICD-10-CM | POA: Diagnosis not present

## 2022-09-12 DIAGNOSIS — E785 Hyperlipidemia, unspecified: Secondary | ICD-10-CM | POA: Diagnosis not present

## 2022-09-12 DIAGNOSIS — R278 Other lack of coordination: Secondary | ICD-10-CM | POA: Diagnosis not present

## 2022-09-12 DIAGNOSIS — D519 Vitamin B12 deficiency anemia, unspecified: Secondary | ICD-10-CM | POA: Diagnosis not present

## 2022-09-12 DIAGNOSIS — E639 Nutritional deficiency, unspecified: Secondary | ICD-10-CM | POA: Diagnosis not present

## 2022-09-12 DIAGNOSIS — M6281 Muscle weakness (generalized): Secondary | ICD-10-CM | POA: Diagnosis not present

## 2022-09-12 DIAGNOSIS — R2681 Unsteadiness on feet: Secondary | ICD-10-CM | POA: Diagnosis not present

## 2022-09-12 DIAGNOSIS — F02A4 Dementia in other diseases classified elsewhere, mild, with anxiety: Secondary | ICD-10-CM | POA: Diagnosis not present

## 2022-09-12 LAB — LIPID PANEL
Cholesterol: 149 (ref 0–200)
HDL: 56 (ref 35–70)
LDL Cholesterol: 75
Triglycerides: 98 (ref 40–160)

## 2022-09-12 LAB — BASIC METABOLIC PANEL
BUN: 16 (ref 4–21)
CO2: 28 — AB (ref 13–22)
Chloride: 104 (ref 99–108)
Creatinine: 0.9 (ref 0.5–1.1)
Glucose: 82
Potassium: 4.2 meq/L (ref 3.5–5.1)
Sodium: 140 (ref 137–147)

## 2022-09-12 LAB — HEPATIC FUNCTION PANEL
ALT: 9 U/L (ref 7–35)
AST: 17 (ref 13–35)
Alkaline Phosphatase: 117 (ref 25–125)
Bilirubin, Total: 0.6

## 2022-09-12 LAB — CBC: RBC: 4.11 (ref 3.87–5.11)

## 2022-09-12 LAB — CBC AND DIFFERENTIAL
HCT: 39 (ref 36–46)
Hemoglobin: 12.8 (ref 12.0–16.0)
Neutrophils Absolute: 4166
Platelets: 191 10*3/uL (ref 150–400)
WBC: 6.1

## 2022-09-12 LAB — COMPREHENSIVE METABOLIC PANEL
Albumin: 3.6 (ref 3.5–5.0)
Calcium: 9.3 (ref 8.7–10.7)
Globulin: 3.3

## 2022-09-13 DIAGNOSIS — R2681 Unsteadiness on feet: Secondary | ICD-10-CM | POA: Diagnosis not present

## 2022-09-13 DIAGNOSIS — R278 Other lack of coordination: Secondary | ICD-10-CM | POA: Diagnosis not present

## 2022-09-13 DIAGNOSIS — M6281 Muscle weakness (generalized): Secondary | ICD-10-CM | POA: Diagnosis not present

## 2022-09-13 DIAGNOSIS — F02A4 Dementia in other diseases classified elsewhere, mild, with anxiety: Secondary | ICD-10-CM | POA: Diagnosis not present

## 2022-09-13 DIAGNOSIS — J189 Pneumonia, unspecified organism: Secondary | ICD-10-CM | POA: Diagnosis not present

## 2022-09-14 ENCOUNTER — Ambulatory Visit (INDEPENDENT_AMBULATORY_CARE_PROVIDER_SITE_OTHER): Payer: Medicare HMO | Admitting: Nurse Practitioner

## 2022-09-14 ENCOUNTER — Encounter: Payer: Self-pay | Admitting: Nurse Practitioner

## 2022-09-14 VITALS — BP 110/80 | HR 83 | Ht 66.0 in | Wt 121.2 lb

## 2022-09-14 DIAGNOSIS — I5189 Other ill-defined heart diseases: Secondary | ICD-10-CM | POA: Diagnosis not present

## 2022-09-14 DIAGNOSIS — R41841 Cognitive communication deficit: Secondary | ICD-10-CM | POA: Diagnosis not present

## 2022-09-14 DIAGNOSIS — J9611 Chronic respiratory failure with hypoxia: Secondary | ICD-10-CM

## 2022-09-14 DIAGNOSIS — F02A4 Dementia in other diseases classified elsewhere, mild, with anxiety: Secondary | ICD-10-CM | POA: Diagnosis not present

## 2022-09-14 DIAGNOSIS — J449 Chronic obstructive pulmonary disease, unspecified: Secondary | ICD-10-CM

## 2022-09-14 DIAGNOSIS — K219 Gastro-esophageal reflux disease without esophagitis: Secondary | ICD-10-CM | POA: Diagnosis not present

## 2022-09-14 DIAGNOSIS — F324 Major depressive disorder, single episode, in partial remission: Secondary | ICD-10-CM | POA: Diagnosis not present

## 2022-09-14 DIAGNOSIS — J479 Bronchiectasis, uncomplicated: Secondary | ICD-10-CM

## 2022-09-14 DIAGNOSIS — R4701 Aphasia: Secondary | ICD-10-CM | POA: Diagnosis not present

## 2022-09-14 DIAGNOSIS — I10 Essential (primary) hypertension: Secondary | ICD-10-CM | POA: Diagnosis not present

## 2022-09-14 DIAGNOSIS — R278 Other lack of coordination: Secondary | ICD-10-CM | POA: Diagnosis not present

## 2022-09-14 NOTE — Progress Notes (Unsigned)
@Patient  ID: [name] [name], female    DOB: 13-Oct-1930, 89 y.o.   MRN: [id number]  No chief complaint on file.   Referring provider: Frederica Kuster, MD  HPI:   TEST/EVENTS:   Allergies  Allergen Reactions   Albuterol Other (See Comments)    "Had some rapid heartbeat in 2013 when she took it for some bronchitis"   Diltiazem Other (See Comments)    "Pre-syncope, bp went a bit low for bp on 180 mg dose"   Naproxen Swelling   Other Nausea Only and Other (See Comments)    AN UNNAMED PAIN MEDICATION CAUSED NAUSEA   Cardizem [Diltiazem Hcl] Other (See Comments)    Hypotension   Nitrofurantoin Rash and Other (See Comments)    Sept., 2018      Immunization History  Administered Date(s) Administered   Covid-19,MRNA Vaccine(Spikevax)55mos. thru 14yrs 02/09/2019, 02/27/2019, 10/01/2019, 05/13/2020, 10/13/2020, 11/30/2020   DTaP, 5 pertussis antigens 05/11/2015   Influenza Split 04/27/2009, 08/28/2009, 09/29/2010, 11/08/2011, 10/26/2012, 11/08/2013   Influenza, High Dose Seasonal PF 11/13/2021   Influenza, Quadrivalent, Recombinant, Inj, Pf 10/05/2018, 10/07/2020   Influenza,inj,Quad PF,6+ Mos 10/29/2012, 11/08/2013, 11/22/2014   Influenza-Unspecified 10/03/2015, 10/18/2016   PNEUMOCOCCAL CONJUGATE-20 06/14/2022   Pneumococcal Polysaccharide-23 08/19/2002, 01/18/2005, 04/27/2009, 08/28/2009   Pneumococcal-Unspecified 05/25/2012   Td (Adult),5 Lf Tetanus Toxid, Preservative Free 08/28/2009   Tdap 04/27/2009, 12/09/2010, 05/11/2015   Zoster Recombinant(Shingrix) 06/01/2017, 06/05/2017, 08/30/2017   Zoster, Live 08/08/2003, 04/27/2009, 05/28/2016   Zoster, Unspecified 05/28/2016, 06/05/2017, 08/30/2017    Past Medical History:  Diagnosis Date   Anxiety    Bradycardia    asymptomatic   Breast CA (HCC) 1993 OR 1994   LEFT, SURGERY AND RADIATION DONE   COPD (chronic obstructive pulmonary disease) (HCC)    MILD, NO INHALERS USED   DEGENERATIVE JOINT DISEASE, KNEE    Family  history of anesthesia complication    NEPHEW NAUSEA/VOMITING   GENU VALGUM    Hyperlipidemia    Hypertension    Numbness of leg 2011   just left shin   OTHER ACQUIRED DEFORMITY OF ANKLE AND FOOT OTHER    Pneumonia 2013   X 2   PVC's (premature ventricular contractions)    ROTATOR CUFF SYNDROME, LEFT    CANNOT LIFT LEFT ARM ALL THE WAY UP   UNEQUAL LEG LENGTH    UTI (urinary tract infection)    STARTED AUGMENTIN  ON 09-10-13    Tobacco History: Social History   Tobacco Use  Smoking Status Former   Current packs/day: 0.00   Average packs/day: 0.3 packs/day for 15.0 years (3.8 ttl pk-yrs)   Types: Cigarettes   Start date: 01/17/1977   Quit date: 01/18/1992   Years since quitting: 30.6   Passive exposure: Never  Smokeless Tobacco Never   Counseling given: Not Answered   Outpatient Medications Prior to Visit  Medication Sig Dispense Refill   acetaminophen (TYLENOL) 325 MG tablet Take 650 mg by mouth every 4 (four) hours as needed.     atorvastatin (LIPITOR) 20 MG tablet Take 20 mg by mouth daily.     Cyanocobalamin (VITAMIN B 12) 500 MCG TABS Take 1,000 mg by mouth daily.     Cyanocobalamin 1000 MCG/ML KIT Inject 1,000 mcg into the skin See admin instructions. Inject 1,000 mcg into the skin every 8 weeks     escitalopram (LEXAPRO) 20 MG tablet Take 20 mg by mouth daily.     galantamine (RAZADYNE ER) 16 MG 24 hr capsule Take 16 mg by  mouth daily with breakfast.     memantine (NAMENDA XR) 28 MG CP24 24 hr capsule Take 1 capsule (28 mg total) by mouth daily. 30 capsule    omeprazole (PRILOSEC) 20 MG capsule Take 20 mg by mouth daily.     polyethylene glycol (MIRALAX / GLYCOLAX) 17 g packet Take 17 g by mouth daily as needed for mild constipation. 14 each 0   Probiotic Product (ALIGN) CHEW Chew 2 tablets by mouth daily.     No facility-administered medications prior to visit.     Review of Systems:   Constitutional: No weight loss or gain, night sweats, fevers, chills,  fatigue, or lassitude. HEENT: No headaches, difficulty swallowing, tooth/dental problems, or sore throat. No sneezing, itching, ear ache, nasal congestion, or post nasal drip CV:  No chest pain, orthopnea, PND, swelling in lower extremities, anasarca, dizziness, palpitations, syncope Resp: No shortness of breath with exertion or at rest. No excess mucus or change in color of mucus. No productive or non-productive. No hemoptysis. No wheezing.  No chest wall deformity GI:  No heartburn, indigestion, abdominal pain, nausea, vomiting, diarrhea, change in bowel habits, loss of appetite, bloody stools.  GU: No dysuria, change in color of urine, urgency or frequency.  No flank pain, no hematuria  Skin: No rash, lesions, ulcerations MSK:  No joint pain or swelling.  No decreased range of motion.  No back pain. Neuro: No dizziness or lightheadedness.  Psych: No depression or anxiety. Mood stable.     Physical Exam:  There were no vitals taken for this visit.  GEN: Pleasant, interactive, well-nourished/chronically-ill appearing/acutely-ill appearing/poorly-nourished/morbidly obese; in no acute distress.****** HEENT:  Normocephalic and atraumatic. EACs patent bilaterally. TM pearly gray with present light reflex bilaterally. PERRLA. Sclera white. Nasal turbinates pink, moist and patent bilaterally. No rhinorrhea present. Oropharynx pink and moist, without exudate or edema. No lesions, ulcerations, or postnasal drip.  NECK:  Supple w/ fair ROM. No JVD present. Normal carotid impulses w/o bruits. Thyroid symmetrical with no goiter or nodules palpated. No lymphadenopathy.   CV: RRR, no m/r/g, no peripheral edema. Pulses intact, +2 bilaterally. No cyanosis, pallor or clubbing. PULMONARY:  Unlabored, regular breathing. Clear bilaterally A&P w/o wheezes/rales/rhonchi. No accessory muscle use.  GI: BS present and normoactive. Soft, non-tender to palpation. No organomegaly or masses detected. No CVA  tenderness. MSK: No erythema, warmth or tenderness. Cap refil <2 sec all extrem. No deformities or joint swelling noted.  Neuro: A/Ox3. No focal deficits noted.   Skin: Warm, no lesions or rashe Psych: Normal affect and behavior. Judgement and thought content appropriate.     Lab Results:  CBC    Component Value Date/Time   WBC 7.0 05/19/2022 0000   WBC 8.8 05/15/2022 0414   RBC 3.23 (A) 05/19/2022 0000   HGB 10.1 (A) 05/19/2022 0000   HGB 13.3 03/17/2021 0843   HCT 31 (A) 05/19/2022 0000   HCT 40.0 03/17/2021 0843   PLT 228 05/19/2022 0000   PLT 238 03/17/2021 0843   MCV 97.5 05/15/2022 0414   MCV 96 03/17/2021 0843   MCH 31.7 05/15/2022 0414   MCHC 32.6 05/15/2022 0414   RDW 13.8 05/15/2022 0414   RDW 12.8 03/17/2021 0843   LYMPHSABS 1.9 05/15/2022 0414   MONOABS 0.8 05/15/2022 0414   EOSABS 0.7 (H) 05/15/2022 0414   BASOSABS 0.1 05/15/2022 0414    BMET    Component Value Date/Time   NA 137 05/19/2022 0000   K 4.1 05/19/2022 0000   CL  101 05/19/2022 0000   CO2 29 (A) 05/19/2022 0000   GLUCOSE 101 (H) 05/15/2022 0414   BUN 11 05/19/2022 0000   CREATININE 0.8 05/19/2022 0000   CREATININE 0.78 05/15/2022 0414   CREATININE 0.87 12/25/2015 1059   CALCIUM 8.7 (L) 05/15/2022 0414   GFRNONAA >60 05/15/2022 0414   GFRAA 56 (L) 03/03/2020 0936    BNP    Component Value Date/Time   BNP 218.0 (H) 05/09/2022 1205     Imaging:  CT Head Wo Contrast  Result Date: 08/20/2022 CLINICAL DATA:  Fall getting out of bed with small laceration EXAM: CT HEAD WITHOUT CONTRAST CT CERVICAL SPINE WITHOUT CONTRAST TECHNIQUE: Multidetector CT imaging of the head and cervical spine was performed following the standard protocol without intravenous contrast. Multiplanar CT image reconstructions of the cervical spine were also generated. RADIATION DOSE REDUCTION: This exam was performed according to the departmental dose-optimization program which includes automated exposure control,  adjustment of the mA and/or kV according to patient size and/or use of iterative reconstruction technique. COMPARISON:  None Available. FINDINGS: CT HEAD FINDINGS Brain: No evidence of acute infarction, hemorrhage, hydrocephalus, extra-axial collection or mass lesion/mass effect. Low-density in the cerebral white matter that is mild for age. Mild cerebral volume loss as well. Vascular: No hyperdense vessel or unexpected calcification. Skull: Mild forehead swelling without acute fracture. Sinuses/Orbits: Chronic frontal sinusitis with complete opacification. Some scalloping seen medially on the left; no cortical breakthrough. Endoscopic sinus surgery with patent openings. Negative orbits. CT CERVICAL SPINE FINDINGS Alignment: No traumatic malalignment. Mild C7-T1 degenerative anterolisthesis. Skull base and vertebrae: No acute fracture. No primary bone lesion or focal pathologic process. Soft tissues and spinal canal: No prevertebral fluid or swelling. No visible canal hematoma. Disc levels:  Generalized degenerative endplate and facet spurring. Upper chest: Mild opacity in the right upper lobe attributed to scarring/interstitial lung disease based on 06/14/2022 chest CT. IMPRESSION: No evidence of acute intracranial or cervical spine injury. Electronically Signed   By: Tiburcio Pea M.D.   On: 08/20/2022 10:21   CT Cervical Spine Wo Contrast  Result Date: 08/20/2022 CLINICAL DATA:  Fall getting out of bed with small laceration EXAM: CT HEAD WITHOUT CONTRAST CT CERVICAL SPINE WITHOUT CONTRAST TECHNIQUE: Multidetector CT imaging of the head and cervical spine was performed following the standard protocol without intravenous contrast. Multiplanar CT image reconstructions of the cervical spine were also generated. RADIATION DOSE REDUCTION: This exam was performed according to the departmental dose-optimization program which includes automated exposure control, adjustment of the mA and/or kV according to patient  size and/or use of iterative reconstruction technique. COMPARISON:  None Available. FINDINGS: CT HEAD FINDINGS Brain: No evidence of acute infarction, hemorrhage, hydrocephalus, extra-axial collection or mass lesion/mass effect. Low-density in the cerebral white matter that is mild for age. Mild cerebral volume loss as well. Vascular: No hyperdense vessel or unexpected calcification. Skull: Mild forehead swelling without acute fracture. Sinuses/Orbits: Chronic frontal sinusitis with complete opacification. Some scalloping seen medially on the left; no cortical breakthrough. Endoscopic sinus surgery with patent openings. Negative orbits. CT CERVICAL SPINE FINDINGS Alignment: No traumatic malalignment. Mild C7-T1 degenerative anterolisthesis. Skull base and vertebrae: No acute fracture. No primary bone lesion or focal pathologic process. Soft tissues and spinal canal: No prevertebral fluid or swelling. No visible canal hematoma. Disc levels:  Generalized degenerative endplate and facet spurring. Upper chest: Mild opacity in the right upper lobe attributed to scarring/interstitial lung disease based on 06/14/2022 chest CT. IMPRESSION: No evidence of acute intracranial or  cervical spine injury. Electronically Signed   By: Tiburcio Pea M.D.   On: 08/20/2022 10:21    Administration History     None          Latest Ref Rng & Units 01/31/2017   12:44 PM  PFT Results  FVC-Pre L 1.84   FVC-Predicted Pre % 73   FVC-Post L 2.08   FVC-Predicted Post % 82   Pre FEV1/FVC % % 70   Post FEV1/FCV % % 73   FEV1-Pre L 1.30   FEV1-Predicted Pre % 69   FEV1-Post L 1.52   DLCO uncorrected ml/min/mmHg 11.91   DLCO UNC% % 46   DLCO corrected ml/min/mmHg 12.53   DLCO COR %Predicted % 48   DLVA Predicted % 78   TLC L 4.37   TLC % Predicted % 84   RV % Predicted % 96     No results found for: "NITRICOXIDE"      Assessment & Plan:   No problem-specific Assessment & Plan notes found for this  encounter.   I spent *** minutes of dedicated to the care of this patient on the date of this encounter to include pre-visit review of records, face-to-face time with the patient discussing conditions above, post visit ordering of testing, clinical documentation with the electronic health record, making appropriate referrals as documented, and communicating necessary findings to members of the patients care team.  Noemi Chapel, NP 09/14/2022  Pt aware and understands NP's role.

## 2022-09-14 NOTE — Patient Instructions (Addendum)
Restart supplemental oxygen 3 lpm POC with activity and continuous 2 lpm at night for goal >88-90%. You don't have to wear this if you are at rest or just getting up to the bathroom  Continue Albuterol inhaler 2 puffs every 6 hours as needed for shortness of breath or wheezing  Overnight oxygen study on 2 lpm supplemental oxygen  Follow up with Dr. Judeth Horn in 4 months. If symptoms do not improve or worsen, please contact office for sooner follow up or seek emergency care.

## 2022-09-15 ENCOUNTER — Encounter: Payer: Self-pay | Admitting: Nurse Practitioner

## 2022-09-15 DIAGNOSIS — J479 Bronchiectasis, uncomplicated: Secondary | ICD-10-CM | POA: Insufficient documentation

## 2022-09-15 DIAGNOSIS — J9611 Chronic respiratory failure with hypoxia: Secondary | ICD-10-CM | POA: Insufficient documentation

## 2022-09-15 DIAGNOSIS — I5189 Other ill-defined heart diseases: Secondary | ICD-10-CM | POA: Insufficient documentation

## 2022-09-15 DIAGNOSIS — J189 Pneumonia, unspecified organism: Secondary | ICD-10-CM | POA: Diagnosis not present

## 2022-09-15 DIAGNOSIS — M6281 Muscle weakness (generalized): Secondary | ICD-10-CM | POA: Diagnosis not present

## 2022-09-15 DIAGNOSIS — F02A4 Dementia in other diseases classified elsewhere, mild, with anxiety: Secondary | ICD-10-CM | POA: Diagnosis not present

## 2022-09-15 DIAGNOSIS — R2681 Unsteadiness on feet: Secondary | ICD-10-CM | POA: Diagnosis not present

## 2022-09-15 DIAGNOSIS — R278 Other lack of coordination: Secondary | ICD-10-CM | POA: Diagnosis not present

## 2022-09-15 NOTE — Assessment & Plan Note (Signed)
Euvolemic. See above plan.

## 2022-09-15 NOTE — Assessment & Plan Note (Addendum)
Likely due to chronic aspiration. No infectious symptoms. No chronic cough. Monitor and initiate mucociliary clearance as needed. Aspiration precautions reviewed.

## 2022-09-15 NOTE — Assessment & Plan Note (Addendum)
Walk test today with desaturations to 86% on room air. Recovered well with 3 lpm on POC unit, which is unchanged from her last visit so overall requirement is stable. No acute worsening. Persistent hypoxia and O2 requirement likely multifactorial. She has mild pulmonary hypertension, evidence of interstitial lung disease with chronic fibrotic changes and btx on imaging, emphysema, and diastolic dysfunction. She does not wish to undergo any further testing. She has minimal to no symptom burden per her report. Advised her of the importance of O2 use and avoiding hypoxic episodes. She was agreeable to resuming therapy. Will obtain ONO on 2 lpm continuous to ensure nocturnal hypoxia is corrected. Her daughter was provided with rx for the facility she was at. POC order placed as she never received this after her last visit. Goal >88-90%. Encouraged to continue or resume PT sessions.  Patient Instructions  Restart supplemental oxygen 3 lpm POC with activity and continuous 2 lpm at night for goal >88-90%. You don't have to wear this if you are at rest or just getting up to the bathroom  Continue Albuterol inhaler 2 puffs every 6 hours as needed for shortness of breath or wheezing  Overnight oxygen study on 2 lpm supplemental oxygen  Follow up with Dr. Judeth Horn in 4 months. If symptoms do not improve or worsen, please contact office for sooner follow up or seek emergency care.

## 2022-09-15 NOTE — Assessment & Plan Note (Signed)
Emphysematous changes. Minimal symptom burden. No recent exacerbations. Does not require SABA use. Action plan in place.

## 2022-09-16 DIAGNOSIS — R278 Other lack of coordination: Secondary | ICD-10-CM | POA: Diagnosis not present

## 2022-09-16 DIAGNOSIS — R41841 Cognitive communication deficit: Secondary | ICD-10-CM | POA: Diagnosis not present

## 2022-09-16 DIAGNOSIS — R4701 Aphasia: Secondary | ICD-10-CM | POA: Diagnosis not present

## 2022-09-16 DIAGNOSIS — J449 Chronic obstructive pulmonary disease, unspecified: Secondary | ICD-10-CM | POA: Diagnosis not present

## 2022-09-16 DIAGNOSIS — F02A4 Dementia in other diseases classified elsewhere, mild, with anxiety: Secondary | ICD-10-CM | POA: Diagnosis not present

## 2022-09-16 DIAGNOSIS — I10 Essential (primary) hypertension: Secondary | ICD-10-CM | POA: Diagnosis not present

## 2022-09-16 DIAGNOSIS — F324 Major depressive disorder, single episode, in partial remission: Secondary | ICD-10-CM | POA: Diagnosis not present

## 2022-09-16 DIAGNOSIS — K219 Gastro-esophageal reflux disease without esophagitis: Secondary | ICD-10-CM | POA: Diagnosis not present

## 2022-09-19 DIAGNOSIS — R278 Other lack of coordination: Secondary | ICD-10-CM | POA: Diagnosis not present

## 2022-09-19 DIAGNOSIS — M6281 Muscle weakness (generalized): Secondary | ICD-10-CM | POA: Diagnosis not present

## 2022-09-19 DIAGNOSIS — F02A4 Dementia in other diseases classified elsewhere, mild, with anxiety: Secondary | ICD-10-CM | POA: Diagnosis not present

## 2022-09-19 DIAGNOSIS — R2681 Unsteadiness on feet: Secondary | ICD-10-CM | POA: Diagnosis not present

## 2022-09-20 DIAGNOSIS — F02A4 Dementia in other diseases classified elsewhere, mild, with anxiety: Secondary | ICD-10-CM | POA: Diagnosis not present

## 2022-09-20 DIAGNOSIS — R278 Other lack of coordination: Secondary | ICD-10-CM | POA: Diagnosis not present

## 2022-09-20 DIAGNOSIS — R2681 Unsteadiness on feet: Secondary | ICD-10-CM | POA: Diagnosis not present

## 2022-09-20 DIAGNOSIS — M6281 Muscle weakness (generalized): Secondary | ICD-10-CM | POA: Diagnosis not present

## 2022-09-21 DIAGNOSIS — M6281 Muscle weakness (generalized): Secondary | ICD-10-CM | POA: Diagnosis not present

## 2022-09-21 DIAGNOSIS — I10 Essential (primary) hypertension: Secondary | ICD-10-CM | POA: Diagnosis not present

## 2022-09-21 DIAGNOSIS — R278 Other lack of coordination: Secondary | ICD-10-CM | POA: Diagnosis not present

## 2022-09-21 DIAGNOSIS — K219 Gastro-esophageal reflux disease without esophagitis: Secondary | ICD-10-CM | POA: Diagnosis not present

## 2022-09-21 DIAGNOSIS — J449 Chronic obstructive pulmonary disease, unspecified: Secondary | ICD-10-CM | POA: Diagnosis not present

## 2022-09-21 DIAGNOSIS — F02A4 Dementia in other diseases classified elsewhere, mild, with anxiety: Secondary | ICD-10-CM | POA: Diagnosis not present

## 2022-09-21 DIAGNOSIS — R41841 Cognitive communication deficit: Secondary | ICD-10-CM | POA: Diagnosis not present

## 2022-09-21 DIAGNOSIS — R2681 Unsteadiness on feet: Secondary | ICD-10-CM | POA: Diagnosis not present

## 2022-09-21 DIAGNOSIS — F324 Major depressive disorder, single episode, in partial remission: Secondary | ICD-10-CM | POA: Diagnosis not present

## 2022-09-21 DIAGNOSIS — R4701 Aphasia: Secondary | ICD-10-CM | POA: Diagnosis not present

## 2022-09-22 DIAGNOSIS — I10 Essential (primary) hypertension: Secondary | ICD-10-CM | POA: Diagnosis not present

## 2022-09-22 DIAGNOSIS — J449 Chronic obstructive pulmonary disease, unspecified: Secondary | ICD-10-CM | POA: Diagnosis not present

## 2022-09-22 DIAGNOSIS — F02A4 Dementia in other diseases classified elsewhere, mild, with anxiety: Secondary | ICD-10-CM | POA: Diagnosis not present

## 2022-09-22 DIAGNOSIS — F324 Major depressive disorder, single episode, in partial remission: Secondary | ICD-10-CM | POA: Diagnosis not present

## 2022-09-22 DIAGNOSIS — R278 Other lack of coordination: Secondary | ICD-10-CM | POA: Diagnosis not present

## 2022-09-22 DIAGNOSIS — R4701 Aphasia: Secondary | ICD-10-CM | POA: Diagnosis not present

## 2022-09-22 DIAGNOSIS — K219 Gastro-esophageal reflux disease without esophagitis: Secondary | ICD-10-CM | POA: Diagnosis not present

## 2022-09-22 DIAGNOSIS — R41841 Cognitive communication deficit: Secondary | ICD-10-CM | POA: Diagnosis not present

## 2022-09-26 DIAGNOSIS — R2681 Unsteadiness on feet: Secondary | ICD-10-CM | POA: Diagnosis not present

## 2022-09-26 DIAGNOSIS — M6281 Muscle weakness (generalized): Secondary | ICD-10-CM | POA: Diagnosis not present

## 2022-09-26 DIAGNOSIS — F02A4 Dementia in other diseases classified elsewhere, mild, with anxiety: Secondary | ICD-10-CM | POA: Diagnosis not present

## 2022-09-26 DIAGNOSIS — R278 Other lack of coordination: Secondary | ICD-10-CM | POA: Diagnosis not present

## 2022-09-27 DIAGNOSIS — I10 Essential (primary) hypertension: Secondary | ICD-10-CM | POA: Diagnosis not present

## 2022-09-27 DIAGNOSIS — R278 Other lack of coordination: Secondary | ICD-10-CM | POA: Diagnosis not present

## 2022-09-27 DIAGNOSIS — F324 Major depressive disorder, single episode, in partial remission: Secondary | ICD-10-CM | POA: Diagnosis not present

## 2022-09-27 DIAGNOSIS — K219 Gastro-esophageal reflux disease without esophagitis: Secondary | ICD-10-CM | POA: Diagnosis not present

## 2022-09-27 DIAGNOSIS — R2681 Unsteadiness on feet: Secondary | ICD-10-CM | POA: Diagnosis not present

## 2022-09-27 DIAGNOSIS — M6281 Muscle weakness (generalized): Secondary | ICD-10-CM | POA: Diagnosis not present

## 2022-09-27 DIAGNOSIS — J449 Chronic obstructive pulmonary disease, unspecified: Secondary | ICD-10-CM | POA: Diagnosis not present

## 2022-09-27 DIAGNOSIS — F02A4 Dementia in other diseases classified elsewhere, mild, with anxiety: Secondary | ICD-10-CM | POA: Diagnosis not present

## 2022-09-27 DIAGNOSIS — R4701 Aphasia: Secondary | ICD-10-CM | POA: Diagnosis not present

## 2022-09-27 DIAGNOSIS — R41841 Cognitive communication deficit: Secondary | ICD-10-CM | POA: Diagnosis not present

## 2022-09-29 DIAGNOSIS — R41841 Cognitive communication deficit: Secondary | ICD-10-CM | POA: Diagnosis not present

## 2022-09-29 DIAGNOSIS — J449 Chronic obstructive pulmonary disease, unspecified: Secondary | ICD-10-CM | POA: Diagnosis not present

## 2022-09-29 DIAGNOSIS — R278 Other lack of coordination: Secondary | ICD-10-CM | POA: Diagnosis not present

## 2022-09-29 DIAGNOSIS — F324 Major depressive disorder, single episode, in partial remission: Secondary | ICD-10-CM | POA: Diagnosis not present

## 2022-09-29 DIAGNOSIS — R2681 Unsteadiness on feet: Secondary | ICD-10-CM | POA: Diagnosis not present

## 2022-09-29 DIAGNOSIS — K219 Gastro-esophageal reflux disease without esophagitis: Secondary | ICD-10-CM | POA: Diagnosis not present

## 2022-09-29 DIAGNOSIS — M6281 Muscle weakness (generalized): Secondary | ICD-10-CM | POA: Diagnosis not present

## 2022-09-29 DIAGNOSIS — I10 Essential (primary) hypertension: Secondary | ICD-10-CM | POA: Diagnosis not present

## 2022-09-29 DIAGNOSIS — F02A4 Dementia in other diseases classified elsewhere, mild, with anxiety: Secondary | ICD-10-CM | POA: Diagnosis not present

## 2022-09-29 DIAGNOSIS — R4701 Aphasia: Secondary | ICD-10-CM | POA: Diagnosis not present

## 2022-10-03 DIAGNOSIS — R278 Other lack of coordination: Secondary | ICD-10-CM | POA: Diagnosis not present

## 2022-10-03 DIAGNOSIS — M6281 Muscle weakness (generalized): Secondary | ICD-10-CM | POA: Diagnosis not present

## 2022-10-03 DIAGNOSIS — R2681 Unsteadiness on feet: Secondary | ICD-10-CM | POA: Diagnosis not present

## 2022-10-03 DIAGNOSIS — F02A4 Dementia in other diseases classified elsewhere, mild, with anxiety: Secondary | ICD-10-CM | POA: Diagnosis not present

## 2022-10-04 DIAGNOSIS — F02A4 Dementia in other diseases classified elsewhere, mild, with anxiety: Secondary | ICD-10-CM | POA: Diagnosis not present

## 2022-10-04 DIAGNOSIS — R41841 Cognitive communication deficit: Secondary | ICD-10-CM | POA: Diagnosis not present

## 2022-10-04 DIAGNOSIS — R278 Other lack of coordination: Secondary | ICD-10-CM | POA: Diagnosis not present

## 2022-10-04 DIAGNOSIS — J449 Chronic obstructive pulmonary disease, unspecified: Secondary | ICD-10-CM | POA: Diagnosis not present

## 2022-10-04 DIAGNOSIS — M6281 Muscle weakness (generalized): Secondary | ICD-10-CM | POA: Diagnosis not present

## 2022-10-04 DIAGNOSIS — I10 Essential (primary) hypertension: Secondary | ICD-10-CM | POA: Diagnosis not present

## 2022-10-04 DIAGNOSIS — K219 Gastro-esophageal reflux disease without esophagitis: Secondary | ICD-10-CM | POA: Diagnosis not present

## 2022-10-04 DIAGNOSIS — R4701 Aphasia: Secondary | ICD-10-CM | POA: Diagnosis not present

## 2022-10-04 DIAGNOSIS — R2681 Unsteadiness on feet: Secondary | ICD-10-CM | POA: Diagnosis not present

## 2022-10-04 DIAGNOSIS — F324 Major depressive disorder, single episode, in partial remission: Secondary | ICD-10-CM | POA: Diagnosis not present

## 2022-10-06 DIAGNOSIS — F02A4 Dementia in other diseases classified elsewhere, mild, with anxiety: Secondary | ICD-10-CM | POA: Diagnosis not present

## 2022-10-06 DIAGNOSIS — R4701 Aphasia: Secondary | ICD-10-CM | POA: Diagnosis not present

## 2022-10-06 DIAGNOSIS — R41841 Cognitive communication deficit: Secondary | ICD-10-CM | POA: Diagnosis not present

## 2022-10-06 DIAGNOSIS — I10 Essential (primary) hypertension: Secondary | ICD-10-CM | POA: Diagnosis not present

## 2022-10-06 DIAGNOSIS — J449 Chronic obstructive pulmonary disease, unspecified: Secondary | ICD-10-CM | POA: Diagnosis not present

## 2022-10-06 DIAGNOSIS — K219 Gastro-esophageal reflux disease without esophagitis: Secondary | ICD-10-CM | POA: Diagnosis not present

## 2022-10-06 DIAGNOSIS — M6281 Muscle weakness (generalized): Secondary | ICD-10-CM | POA: Diagnosis not present

## 2022-10-06 DIAGNOSIS — R2681 Unsteadiness on feet: Secondary | ICD-10-CM | POA: Diagnosis not present

## 2022-10-06 DIAGNOSIS — F324 Major depressive disorder, single episode, in partial remission: Secondary | ICD-10-CM | POA: Diagnosis not present

## 2022-10-06 DIAGNOSIS — R278 Other lack of coordination: Secondary | ICD-10-CM | POA: Diagnosis not present

## 2022-10-10 DIAGNOSIS — K219 Gastro-esophageal reflux disease without esophagitis: Secondary | ICD-10-CM | POA: Diagnosis not present

## 2022-10-10 DIAGNOSIS — R41841 Cognitive communication deficit: Secondary | ICD-10-CM | POA: Diagnosis not present

## 2022-10-10 DIAGNOSIS — F324 Major depressive disorder, single episode, in partial remission: Secondary | ICD-10-CM | POA: Diagnosis not present

## 2022-10-10 DIAGNOSIS — R278 Other lack of coordination: Secondary | ICD-10-CM | POA: Diagnosis not present

## 2022-10-10 DIAGNOSIS — R2681 Unsteadiness on feet: Secondary | ICD-10-CM | POA: Diagnosis not present

## 2022-10-10 DIAGNOSIS — M6281 Muscle weakness (generalized): Secondary | ICD-10-CM | POA: Diagnosis not present

## 2022-10-10 DIAGNOSIS — J449 Chronic obstructive pulmonary disease, unspecified: Secondary | ICD-10-CM | POA: Diagnosis not present

## 2022-10-10 DIAGNOSIS — R4701 Aphasia: Secondary | ICD-10-CM | POA: Diagnosis not present

## 2022-10-10 DIAGNOSIS — I10 Essential (primary) hypertension: Secondary | ICD-10-CM | POA: Diagnosis not present

## 2022-10-10 DIAGNOSIS — F02A4 Dementia in other diseases classified elsewhere, mild, with anxiety: Secondary | ICD-10-CM | POA: Diagnosis not present

## 2022-10-12 DIAGNOSIS — R2681 Unsteadiness on feet: Secondary | ICD-10-CM | POA: Diagnosis not present

## 2022-10-12 DIAGNOSIS — M6281 Muscle weakness (generalized): Secondary | ICD-10-CM | POA: Diagnosis not present

## 2022-10-12 DIAGNOSIS — F02A4 Dementia in other diseases classified elsewhere, mild, with anxiety: Secondary | ICD-10-CM | POA: Diagnosis not present

## 2022-10-12 DIAGNOSIS — R278 Other lack of coordination: Secondary | ICD-10-CM | POA: Diagnosis not present

## 2022-10-13 DIAGNOSIS — M6281 Muscle weakness (generalized): Secondary | ICD-10-CM | POA: Diagnosis not present

## 2022-10-13 DIAGNOSIS — R2681 Unsteadiness on feet: Secondary | ICD-10-CM | POA: Diagnosis not present

## 2022-10-13 DIAGNOSIS — R278 Other lack of coordination: Secondary | ICD-10-CM | POA: Diagnosis not present

## 2022-10-13 DIAGNOSIS — F02A4 Dementia in other diseases classified elsewhere, mild, with anxiety: Secondary | ICD-10-CM | POA: Diagnosis not present

## 2022-10-14 DIAGNOSIS — J449 Chronic obstructive pulmonary disease, unspecified: Secondary | ICD-10-CM | POA: Diagnosis not present

## 2022-10-14 DIAGNOSIS — F324 Major depressive disorder, single episode, in partial remission: Secondary | ICD-10-CM | POA: Diagnosis not present

## 2022-10-14 DIAGNOSIS — I10 Essential (primary) hypertension: Secondary | ICD-10-CM | POA: Diagnosis not present

## 2022-10-14 DIAGNOSIS — F02A4 Dementia in other diseases classified elsewhere, mild, with anxiety: Secondary | ICD-10-CM | POA: Diagnosis not present

## 2022-10-14 DIAGNOSIS — R278 Other lack of coordination: Secondary | ICD-10-CM | POA: Diagnosis not present

## 2022-10-14 DIAGNOSIS — R41841 Cognitive communication deficit: Secondary | ICD-10-CM | POA: Diagnosis not present

## 2022-10-14 DIAGNOSIS — R4701 Aphasia: Secondary | ICD-10-CM | POA: Diagnosis not present

## 2022-10-14 DIAGNOSIS — K219 Gastro-esophageal reflux disease without esophagitis: Secondary | ICD-10-CM | POA: Diagnosis not present

## 2022-10-17 ENCOUNTER — Non-Acute Institutional Stay (INDEPENDENT_AMBULATORY_CARE_PROVIDER_SITE_OTHER): Payer: Self-pay | Admitting: Orthopedic Surgery

## 2022-10-17 ENCOUNTER — Encounter: Payer: Self-pay | Admitting: Orthopedic Surgery

## 2022-10-17 DIAGNOSIS — Z0000 Encounter for general adult medical examination without abnormal findings: Secondary | ICD-10-CM | POA: Diagnosis not present

## 2022-10-17 DIAGNOSIS — R2681 Unsteadiness on feet: Secondary | ICD-10-CM | POA: Diagnosis not present

## 2022-10-17 DIAGNOSIS — R278 Other lack of coordination: Secondary | ICD-10-CM | POA: Diagnosis not present

## 2022-10-17 DIAGNOSIS — M6281 Muscle weakness (generalized): Secondary | ICD-10-CM | POA: Diagnosis not present

## 2022-10-17 DIAGNOSIS — F02A4 Dementia in other diseases classified elsewhere, mild, with anxiety: Secondary | ICD-10-CM | POA: Diagnosis not present

## 2022-10-17 NOTE — Progress Notes (Signed)
Subjective:   [name] [name] is a 89 y.o. female who presents for Medicare Annual (Subsequent) preventive examination.  Visit Complete: In person  Patient Medicare AWV questionnaire was completed by the patient on 10/17/2022; I have confirmed that all information answered by patient is correct and no changes since this date.  Cardiac Risk Factors include: advanced age (>39men, >43 women);hypertension;sedentary lifestyle     Objective:    Today's Vitals   10/17/22 0945  BP: 114/67  Pulse: 67  Resp: 20  Temp: (!) 97.2 F (36.2 C)  SpO2: 95%  Weight: 114 lb 6.4 oz (51.9 kg)  Height: 5\' 6"  (1.676 m)   Body mass index is 18.46 kg/m.     10/17/2022    9:55 AM 08/22/2022    9:47 AM 08/20/2022    9:17 AM 07/20/2022    4:22 PM 05/16/2022    3:05 PM 05/10/2022    7:51 PM 05/09/2022   11:31 AM  Advanced Directives  Does Patient Have a Medical Advance Directive? Yes Yes No Yes No  No  Type of Estate agent of [place];Living will;Out of facility DNR (pink MOST or yellow form) Out of facility DNR (pink MOST or yellow form)       Does patient want to make changes to medical advance directive? No - Patient declined No - Patient declined  No - Patient declined     Copy of Healthcare Power of Attorney in Chart? No - copy requested        Would patient like information on creating a medical advance directive?   No - Patient declined  No - Patient declined No - Patient declined     Current Medications (verified) Outpatient Encounter Medications as of 10/17/2022  Medication Sig   acetaminophen (TYLENOL) 325 MG tablet Take 650 mg by mouth every 4 (four) hours as needed.   atorvastatin (LIPITOR) 20 MG tablet Take 20 mg by mouth daily.   Cyanocobalamin (VITAMIN B 12) 500 MCG TABS Take 1,000 mg by mouth daily.   Cyanocobalamin 1000 MCG/ML KIT Inject 1,000 mcg into the skin See admin instructions. Inject 1,000 mcg into the skin every 8 weeks   escitalopram (LEXAPRO) 20 MG  tablet Take 20 mg by mouth daily.   GALANTAMINE HYDROBROMIDE PO Take 16 mg by mouth daily.   memantine (NAMENDA XR) 28 MG CP24 24 hr capsule Take 1 capsule (28 mg total) by mouth daily.   omeprazole (PRILOSEC) 20 MG capsule Take 20 mg by mouth daily.   polyethylene glycol (MIRALAX / GLYCOLAX) 17 g packet Take 17 g by mouth daily as needed for mild constipation.   Probiotic Product (ALIGN) CHEW Chew 2 tablets by mouth daily.   [DISCONTINUED] galantamine (RAZADYNE ER) 16 MG 24 hr capsule Take 16 mg by mouth daily with breakfast.   No facility-administered encounter medications on file as of 10/17/2022.    Allergies (verified) Albuterol, Diltiazem, Naproxen, Other, Yellow dye, Cardizem [diltiazem hcl], and Nitrofurantoin   History: Past Medical History:  Diagnosis Date   Anxiety    Bradycardia    asymptomatic   Breast CA (HCC) 1993 OR 1994   LEFT, SURGERY AND RADIATION DONE   COPD (chronic obstructive pulmonary disease) (HCC)    MILD, NO INHALERS USED   DEGENERATIVE JOINT DISEASE, KNEE    Family history of anesthesia complication    NEPHEW NAUSEA/VOMITING   GENU VALGUM    Hyperlipidemia    Hypertension    Numbness of leg 2011  just left shin   OTHER ACQUIRED DEFORMITY OF ANKLE AND FOOT OTHER    Pneumonia 2013   X 2   PVC's (premature ventricular contractions)    ROTATOR CUFF SYNDROME, LEFT    CANNOT LIFT LEFT ARM ALL THE WAY UP   UNEQUAL LEG LENGTH    UTI (urinary tract infection)    STARTED AUGMENTIN  ON 09-10-13   Past Surgical History:  Procedure Laterality Date   APPENDECTOMY  AGE 14   BREAST LUMPECTOMY Left 1991   radiation   BREAST SURGERY Left 1993 OR 1994   LUMPECTOMY AND RADIATION DONE   JOINT REPLACEMENT     NASAL SINUS SURGERY  2006   REVERSE SHOULDER ARTHROPLASTY Left 07/31/2015   Procedure: LEFT REVERSE SHOULDER ARTHROPLASTY;  Surgeon: Beverely Low, MD;  Location: [facility] OR;  Service: Orthopedics;  Laterality: Left;   TOTAL HIP ARTHROPLASTY Bilateral LEFT 2008  AND RIGHT 2010   TOTAL KNEE ARTHROPLASTY Left 09/17/2013   Procedure: LEFT TOTAL KNEE ARTHROPLASTY;  Surgeon: Shelda Pal, MD;  Location: [facility];  Service: Orthopedics;  Laterality: Left;   Family History  Problem Relation Age of Onset   Diabetes Mother    Heart Problems Father    Hypertension Brother    Social History   Socioeconomic History   Marital status: Widowed    Spouse name: Not on file   Number of children: Not on file   Years of education: Not on file   Highest education level: Not on file  Occupational History   Not on file  Tobacco Use   Smoking status: Former    Current packs/day: 0.00    Average packs/day: 0.3 packs/day for 15.0 years (3.8 ttl pk-yrs)    Types: Cigarettes    Start date: 01/17/1977    Quit date: 01/18/1992    Years since quitting: 30.7    Passive exposure: Never   Smokeless tobacco: Never  Substance and Sexual Activity   Alcohol use: Yes    Comment: WINE 2 GLASSES PER DAY   Drug use: No   Sexual activity: Not on file  Other Topics Concern   Not on file  Social History Narrative   Not on file   Social Determinants of Health   Financial Resource Strain: Low Risk  (10/17/2022)   Overall Financial Resource Strain (CARDIA)    Difficulty of Paying Living Expenses: Not hard at all  Food Insecurity: No Food Insecurity (10/17/2022)   Hunger Vital Sign    Worried About Running Out of Food in the Last Year: Never true    Ran Out of Food in the Last Year: Never true  Transportation Needs: No Transportation Needs (10/17/2022)   PRAPARE - Administrator, Civil Service (Medical): No    Lack of Transportation (Non-Medical): No  Physical Activity: Sufficiently Active (10/17/2022)   Exercise Vital Sign    Days of Exercise per Week: 5 days    Minutes of Exercise per Session: 30 min  Stress: No Stress Concern Present (10/17/2022)   [employer] of Occupational Health - Occupational Stress Questionnaire    Feeling of Stress : Not at all   Social Connections: Socially Isolated (10/17/2022)   Social Connection and Isolation Panel [NHANES]    Frequency of Communication with Friends and Family: More than three times a week    Frequency of Social Gatherings with Friends and Family: More than three times a week    Attends Religious Services: Never    Active Member of  Clubs or Organizations: No    Attends Banker Meetings: Never    Marital Status: Widowed    Tobacco Counseling Counseling given: Not Answered   Clinical Intake:  Pre-visit preparation completed: Yes  Pain : No/denies pain     BMI - recorded: 18.46 Nutritional Status: BMI <19  Underweight Nutritional Risks: None Diabetes: No  How often do you need to have someone help you when you read instructions, pamphlets, or other written materials from your doctor or pharmacy?: 2 - Rarely What is the last grade level you completed in school?: 2 years college  Interpreter Needed?: No      Activities of Daily Living    10/17/2022   10:11 AM 05/10/2022    2:54 AM  In your present state of health, do you have any difficulty performing the following activities:  Hearing? 0 1  Vision? 0 0  Difficulty concentrating or making decisions? 0 1  Walking or climbing stairs? 0 1  Dressing or bathing? 0 0  Doing errands, shopping? 1 0  Preparing Food and eating ? Y   Using the Toilet? N   In the past six months, have you accidently leaked urine? N   Do you have problems with loss of bowel control? N   Managing your Medications? Y   Managing your Finances? Y   Housekeeping or managing your Housekeeping? Y     Patient Care Team: Mahlon Gammon, MD as PCP - General (Internal Medicine) Quintella Reichert, MD as PCP - Cardiology (Cardiology)  Indicate any recent Medical Services you may have received from other than [facility] providers in the past year (date may be approximate).     Assessment:   This is a routine wellness examination for  Anokhi.  Hearing/Vision screen No results found.   Goals Addressed             This Visit's Progress    Maintain Mobility and Function   On track    Evidence-based guidance:  Acknowledge and validate impact of pain, loss of strength and potential disfigurement (hand osteoarthritis) on mental health and daily life, such as social isolation, anxiety, depression, impaired sexual relationship and   injury from falls.  Anticipate referral to physical or occupational therapy for assessment, therapeutic exercise and recommendation for adaptive equipment or assistive devices; encourage participation.  Assess impact on ability to perform activities of daily living, as well as engage in sports and leisure events or requirements of work or school.  Provide anticipatory guidance and reassurance about the benefit of exercise to maintain function; acknowledge and normalize fear that exercise may worsen symptoms.  Encourage regular exercise, at least 10 minutes at a time for 45 minutes per week; consider yoga, water exercise and proprioceptive exercises; encourage use of wearable activity tracker to increase motivation and adherence.  Encourage maintenance or resumption of daily activities, including employment, as pain allows and with minimal exposure to trauma.  Assist patient to advocate for adaptations to the work environment.  Consider level of pain and function, gender, age, lifestyle, patient preference, quality of life, readiness and ?ocapacity to benefit? when recommending patients for orthopaedic surgery consultation.  Explore strategies, such as changes to medication regimen or activity that enables patient to anticipate and manage flare-ups that increase deconditioning and disability.  Explore patient preferences; encourage exposure to a broader range of activities that have been avoided for fear of experiencing pain.  Identify barriers to participation in therapy or exercise, such as pain  with activity, anticipated or imagined pain.  Monitor postoperative joint replacement or any preexisting joint replacement for ongoing pain and loss of function; provide social support and encouragement throughout recovery.   Notes:        Depression Screen    10/17/2022   10:14 AM 07/20/2022    4:22 PM  PHQ 2/9 Scores  PHQ - 2 Score 0 0    Fall Risk    10/17/2022   10:16 AM 08/22/2022   11:03 AM 07/20/2022    4:21 PM 06/23/2022   12:59 PM  Fall Risk   Falls in the past year? 1 1 0 0  Number falls in past yr: 0 0 0 0  Injury with Fall? 0 1 0 0  Risk for fall due to : History of fall(s);Impaired balance/gait History of fall(s);Impaired balance/gait  History of fall(s);Impaired balance/gait;Impaired mobility  Follow up Falls evaluation completed;Education provided;Falls prevention discussed Falls evaluation completed;Education provided  Falls evaluation completed    MEDICARE RISK AT HOME: Medicare Risk at Home Any stairs in or around the home?: No If so, are there any without handrails?: No Home free of loose throw rugs in walkways, pet beds, electrical cords, etc?: Yes Adequate lighting in your home to reduce risk of falls?: Yes Life alert?: No Use of a cane, walker or w/c?: Yes Grab bars in the bathroom?: Yes Shower chair or bench in shower?: Yes Elevated toilet seat or a handicapped toilet?: Yes  TIMED UP AND GO:  Was the test performed?  No    Cognitive Function:    10/17/2022   10:16 AM  MMSE - Mini Mental State Exam  Orientation to time 5  Orientation to Place 5  Registration 3  Attention/ Calculation 5  Recall 2  Language- name 2 objects 2  Language- repeat 1  Language- follow 3 step command 3  Language- read & follow direction 1  Copy design 1        Immunizations Immunization History  Administered Date(s) Administered   DTaP, 5 pertussis antigens 05/11/2015   Influenza Split 04/27/2009, 08/28/2009, 09/29/2010, 11/08/2011, 10/26/2012, 11/08/2013    Influenza, High Dose Seasonal PF 11/13/2021   Influenza, Quadrivalent, Recombinant, Inj, Pf 10/05/2018, 10/07/2020   Influenza,inj,Quad PF,6+ Mos 10/29/2012, 11/08/2013, 11/22/2014   Influenza-Unspecified 10/03/2015, 10/18/2016   Moderna Covid-19 Seasonal Vaccine 6 months thru 88years of age 46/23/2021, 02/27/2019, 10/01/2019, 05/13/2020, 10/13/2020, 11/30/2020   PNEUMOCOCCAL CONJUGATE-20 06/14/2022   Pneumococcal Polysaccharide-23 08/19/2002, 01/18/2005, 04/27/2009, 08/28/2009   Pneumococcal-Unspecified 05/25/2012   Td (Adult),5 Lf Tetanus Toxid, Preservative Free 08/28/2009   Tdap 04/27/2009, 12/09/2010, 05/11/2015   Zoster Recombinant(Shingrix) 06/01/2017, 06/05/2017, 08/30/2017   Zoster, Live 08/08/2003, 04/27/2009, 05/28/2016   Zoster, Unspecified 05/28/2016, 06/05/2017, 08/30/2017    TDAP status: Up to date  Flu Vaccine status: Due, Education has been provided regarding the importance of this vaccine. Advised may receive this vaccine at local pharmacy or Health Dept. Aware to provide a copy of the vaccination record if obtained from local pharmacy or Health Dept. Verbalized acceptance and understanding.  Pneumococcal vaccine status: Up to date  Covid-19 vaccine status: Completed vaccines  Qualifies for Shingles Vaccine? Yes   Zostavax completed Yes   Shingrix Completed?: Yes  Screening Tests Health Maintenance  Topic Date Due   COVID-19 Vaccine (1) 11/30/2020   INFLUENZA VACCINE  08/18/2022   Medicare Annual Wellness (AWV)  10/17/2023   DTaP/Tdap/Td (6 - Td or Tdap) 05/10/2025   Pneumonia Vaccine 53+ Years old  Completed   DEXA SCAN  Completed   Zoster Vaccines- Shingrix  Completed   HPV VACCINES  Aged Out    Health Maintenance  Health Maintenance Due  Topic Date Due   COVID-19 Vaccine (1) 11/30/2020   INFLUENZA VACCINE  08/18/2022    Colorectal cancer screening: No longer required.   Mammogram status: No longer required due to advanced age.  Bone Density  status: Completed 10/2019. Results reflect: Bone density results: OSTEOPOROSIS. Repeat every 2 years.  Lung Cancer Screening: (Low Dose CT Chest recommended if Age 66-80 years, 20 pack-year currently smoking OR have quit w/in 15years.) does not qualify.   Lung Cancer Screening Referral: No  Additional Screening:  Hepatitis C Screening: does not qualify; Completed   Vision Screening: Recommended annual ophthalmology exams for early detection of glaucoma and other disorders of the eye. Is the patient up to date with their annual eye exam?  Yes  Who is the provider or what is the name of the office in which the patient attends annual eye exams? In house provider If pt is not established with a provider, would they like to be referred to a provider to establish care? No .   Dental Screening: Recommended annual dental exams for proper oral hygiene  Diabetic Foot Exam: Diabetic Foot Exam: Completed 10/17/2022  Community Resource Referral / Chronic Care Management: CRR required this visit?  No   CCM required this visit?  No     Plan:     I have personally reviewed and noted the following in the patient's chart:   Medical and social history Use of alcohol, tobacco or illicit drugs  Current medications and supplements including opioid prescriptions. Patient is not currently taking opioid prescriptions. Functional ability and status Nutritional status Physical activity Advanced directives List of other physicians Hospitalizations, surgeries, and ER visits in previous 12 months Vitals Screenings to include cognitive, depression, and falls Referrals and appointments  In addition, I have reviewed and discussed with patient certain preventive protocols, quality metrics, and best practice recommendations. A written personalized care plan for preventive services as well as general preventive health recommendations were provided to patient.     Octavia Heir, NP   10/17/2022   After  Visit Summary: (MyChart) Due to this being a telephonic visit, the after visit summary with patients personalized plan was offered to patient via MyChart   Nurse Notes: MMSE 28/30. Plans to get flu/covid vaccines when offered by [facility]. Doing well in assisted living. Does not want mammograms or bone density tests anymore.

## 2022-10-17 NOTE — Patient Instructions (Addendum)
  [name] [name] , Thank you for taking time to come for your Medicare Wellness Visit. I appreciate your ongoing commitment to your health goals. Please review the following plan we discussed and let me know if I can assist you in the future.   These are the goals we discussed:  Goals      Maintain Mobility and Function     Evidence-based guidance:  Acknowledge and validate impact of pain, loss of strength and potential disfigurement (hand osteoarthritis) on mental health and daily life, such as social isolation, anxiety, depression, impaired sexual relationship and   injury from falls.  Anticipate referral to physical or occupational therapy for assessment, therapeutic exercise and recommendation for adaptive equipment or assistive devices; encourage participation.  Assess impact on ability to perform activities of daily living, as well as engage in sports and leisure events or requirements of work or school.  Provide anticipatory guidance and reassurance about the benefit of exercise to maintain function; acknowledge and normalize fear that exercise may worsen symptoms.  Encourage regular exercise, at least 10 minutes at a time for 45 minutes per week; consider yoga, water exercise and proprioceptive exercises; encourage use of wearable activity tracker to increase motivation and adherence.  Encourage maintenance or resumption of daily activities, including employment, as pain allows and with minimal exposure to trauma.  Assist patient to advocate for adaptations to the work environment.  Consider level of pain and function, gender, age, lifestyle, patient preference, quality of life, readiness and ?ocapacity to benefit? when recommending patients for orthopaedic surgery consultation.  Explore strategies, such as changes to medication regimen or activity that enables patient to anticipate and manage flare-ups that increase deconditioning and disability.  Explore patient preferences; encourage  exposure to a broader range of activities that have been avoided for fear of experiencing pain.  Identify barriers to participation in therapy or exercise, such as pain with activity, anticipated or imagined pain.  Monitor postoperative joint replacement or any preexisting joint replacement for ongoing pain and loss of function; provide social support and encouragement throughout recovery.   Notes:         This is a list of the screening recommended for you and due dates:  Health Maintenance  Topic Date Due   COVID-19 Vaccine (1) 11/30/2020   Flu Shot  08/18/2022   Medicare Annual Wellness Visit  10/17/2023   DTaP/Tdap/Td vaccine (6 - Td or Tdap) 05/10/2025   Pneumonia Vaccine  Completed   DEXA scan (bone density measurement)  Completed   Zoster (Shingles) Vaccine  Completed   HPV Vaccine  Aged Out   MMSE 28/30. Plans to get flu/covid vaccines when offered by [facility]. Doing well in assisted living. Does not want mammograms or bone density tests anymore.

## 2022-10-18 DIAGNOSIS — R2681 Unsteadiness on feet: Secondary | ICD-10-CM | POA: Diagnosis not present

## 2022-10-18 DIAGNOSIS — R278 Other lack of coordination: Secondary | ICD-10-CM | POA: Diagnosis not present

## 2022-10-18 DIAGNOSIS — M6281 Muscle weakness (generalized): Secondary | ICD-10-CM | POA: Diagnosis not present

## 2022-10-18 DIAGNOSIS — F02A4 Dementia in other diseases classified elsewhere, mild, with anxiety: Secondary | ICD-10-CM | POA: Diagnosis not present

## 2022-10-20 DIAGNOSIS — R41841 Cognitive communication deficit: Secondary | ICD-10-CM | POA: Diagnosis not present

## 2022-10-20 DIAGNOSIS — J449 Chronic obstructive pulmonary disease, unspecified: Secondary | ICD-10-CM | POA: Diagnosis not present

## 2022-10-20 DIAGNOSIS — F02A4 Dementia in other diseases classified elsewhere, mild, with anxiety: Secondary | ICD-10-CM | POA: Diagnosis not present

## 2022-10-20 DIAGNOSIS — K219 Gastro-esophageal reflux disease without esophagitis: Secondary | ICD-10-CM | POA: Diagnosis not present

## 2022-10-20 DIAGNOSIS — F324 Major depressive disorder, single episode, in partial remission: Secondary | ICD-10-CM | POA: Diagnosis not present

## 2022-10-20 DIAGNOSIS — R4701 Aphasia: Secondary | ICD-10-CM | POA: Diagnosis not present

## 2022-10-20 DIAGNOSIS — R278 Other lack of coordination: Secondary | ICD-10-CM | POA: Diagnosis not present

## 2022-10-20 DIAGNOSIS — I10 Essential (primary) hypertension: Secondary | ICD-10-CM | POA: Diagnosis not present

## 2022-10-21 DIAGNOSIS — M6281 Muscle weakness (generalized): Secondary | ICD-10-CM | POA: Diagnosis not present

## 2022-10-21 DIAGNOSIS — K219 Gastro-esophageal reflux disease without esophagitis: Secondary | ICD-10-CM | POA: Diagnosis not present

## 2022-10-21 DIAGNOSIS — R4701 Aphasia: Secondary | ICD-10-CM | POA: Diagnosis not present

## 2022-10-21 DIAGNOSIS — R41841 Cognitive communication deficit: Secondary | ICD-10-CM | POA: Diagnosis not present

## 2022-10-21 DIAGNOSIS — R278 Other lack of coordination: Secondary | ICD-10-CM | POA: Diagnosis not present

## 2022-10-21 DIAGNOSIS — J449 Chronic obstructive pulmonary disease, unspecified: Secondary | ICD-10-CM | POA: Diagnosis not present

## 2022-10-21 DIAGNOSIS — R2681 Unsteadiness on feet: Secondary | ICD-10-CM | POA: Diagnosis not present

## 2022-10-21 DIAGNOSIS — F02A4 Dementia in other diseases classified elsewhere, mild, with anxiety: Secondary | ICD-10-CM | POA: Diagnosis not present

## 2022-10-21 DIAGNOSIS — I10 Essential (primary) hypertension: Secondary | ICD-10-CM | POA: Diagnosis not present

## 2022-10-21 DIAGNOSIS — F324 Major depressive disorder, single episode, in partial remission: Secondary | ICD-10-CM | POA: Diagnosis not present

## 2022-10-24 DIAGNOSIS — M6281 Muscle weakness (generalized): Secondary | ICD-10-CM | POA: Diagnosis not present

## 2022-10-24 DIAGNOSIS — K219 Gastro-esophageal reflux disease without esophagitis: Secondary | ICD-10-CM | POA: Diagnosis not present

## 2022-10-24 DIAGNOSIS — F324 Major depressive disorder, single episode, in partial remission: Secondary | ICD-10-CM | POA: Diagnosis not present

## 2022-10-24 DIAGNOSIS — R41841 Cognitive communication deficit: Secondary | ICD-10-CM | POA: Diagnosis not present

## 2022-10-24 DIAGNOSIS — F02A4 Dementia in other diseases classified elsewhere, mild, with anxiety: Secondary | ICD-10-CM | POA: Diagnosis not present

## 2022-10-24 DIAGNOSIS — R278 Other lack of coordination: Secondary | ICD-10-CM | POA: Diagnosis not present

## 2022-10-24 DIAGNOSIS — R2681 Unsteadiness on feet: Secondary | ICD-10-CM | POA: Diagnosis not present

## 2022-10-24 DIAGNOSIS — R4701 Aphasia: Secondary | ICD-10-CM | POA: Diagnosis not present

## 2022-10-24 DIAGNOSIS — J449 Chronic obstructive pulmonary disease, unspecified: Secondary | ICD-10-CM | POA: Diagnosis not present

## 2022-10-24 DIAGNOSIS — I10 Essential (primary) hypertension: Secondary | ICD-10-CM | POA: Diagnosis not present

## 2022-10-25 ENCOUNTER — Non-Acute Institutional Stay: Payer: Medicare HMO | Admitting: Orthopedic Surgery

## 2022-10-25 ENCOUNTER — Encounter: Payer: Self-pay | Admitting: Orthopedic Surgery

## 2022-10-25 DIAGNOSIS — I10 Essential (primary) hypertension: Secondary | ICD-10-CM | POA: Diagnosis not present

## 2022-10-25 DIAGNOSIS — F02B0 Dementia in other diseases classified elsewhere, moderate, without behavioral disturbance, psychotic disturbance, mood disturbance, and anxiety: Secondary | ICD-10-CM

## 2022-10-25 DIAGNOSIS — K219 Gastro-esophageal reflux disease without esophagitis: Secondary | ICD-10-CM

## 2022-10-25 DIAGNOSIS — J449 Chronic obstructive pulmonary disease, unspecified: Secondary | ICD-10-CM | POA: Diagnosis not present

## 2022-10-25 DIAGNOSIS — G301 Alzheimer's disease with late onset: Secondary | ICD-10-CM | POA: Diagnosis not present

## 2022-10-25 DIAGNOSIS — F339 Major depressive disorder, recurrent, unspecified: Secondary | ICD-10-CM | POA: Diagnosis not present

## 2022-10-25 NOTE — Progress Notes (Signed)
Location:  [facility] Room Number: 9/A Place of Service:  ALF [phone]) Provider:  Octavia Heir, NP   Mahlon Gammon, MD  Patient Care Team: Mahlon Gammon, MD as PCP - General (Internal Medicine) Quintella Reichert, MD as PCP - Cardiology (Cardiology)  Extended Emergency Contact Information Primary Emergency Contact: Ruch,Holly Address: [address]          [place], [place] [address] [place] Phone: [phone] Relation: Niece Secondary Emergency Contact: Ruch,George Address: [address]          [place], [place] [address] [place] Home Phone: [phone] Mobile Phone: [phone] Relation: Nephew  Code Status:  DNR Goals of care: Advanced Directive information    10/17/2022    9:55 AM  Advanced Directives  Does Patient Have a Medical Advance Directive? Yes  Type of Estate agent of [place];Living will;Out of facility DNR (pink MOST or yellow form)  Does patient want to make changes to medical advance directive? No - Patient declined  Copy of Healthcare Power of Attorney in Chart? No - copy requested     Chief Complaint  Patient presents with   Medical Management of Chronic Issues    HPI:  Pt is a 89 y.o. female seen today for medical management of chronic diseases.    She currently resides on the assisted living unit at [facility]. PMH:   HTN- BUN/creat 16/0.9 09/12/2022, off metoprolol since 02/2022, see trends below COPD- 05/2022 CT chest noted stable COPD/chronic bronchitis, uses oxygen prn at night, not on oxygen GERD- hgb 12.8 05/19/2022, remains on omeprazole Alzheimer's dementia- MMSE 28/30 (09/30)> was 20/30, 08/2022 CT head noted low-density in the cerebral white matter that is mild for age, and mild cerebral volume loss, no behaviors, remains on Galantamine and Namenda Recurrent depression- no mood changes, Na+ 140 08/2022, remains on Lexapro  No recent falls or injuries.     Recent blood pressures:  10/02- 93/60  09/25- 114/67  09/18- 109/57  Recent weights:  10/08- 124.4 lbs  09/02- 114.4 lbs  07/11- 120 lbs    Past Medical History:  Diagnosis Date   Anxiety    Bradycardia    asymptomatic   Breast CA (HCC) 1993 OR 1994   LEFT, SURGERY AND RADIATION DONE   COPD (chronic obstructive pulmonary disease) (HCC)    MILD, NO INHALERS USED   DEGENERATIVE JOINT DISEASE, KNEE    Family history of anesthesia complication    NEPHEW NAUSEA/VOMITING   GENU VALGUM    Hyperlipidemia    Hypertension    Numbness of leg 2011   just left shin   OTHER ACQUIRED DEFORMITY OF ANKLE AND FOOT OTHER    Pneumonia 2013   X 2   PVC's (premature ventricular contractions)    ROTATOR CUFF SYNDROME, LEFT    CANNOT LIFT LEFT ARM ALL THE WAY UP   UNEQUAL LEG LENGTH    UTI (urinary tract infection)    STARTED AUGMENTIN  ON 09-10-13   Past Surgical History:  Procedure Laterality Date   APPENDECTOMY  AGE 5   BREAST LUMPECTOMY Left 1991   radiation   BREAST SURGERY Left 1993 OR 1994   LUMPECTOMY AND RADIATION DONE   JOINT REPLACEMENT     NASAL SINUS SURGERY  2006   REVERSE SHOULDER ARTHROPLASTY Left 07/31/2015   Procedure: LEFT REVERSE SHOULDER ARTHROPLASTY;  Surgeon: Beverely Low, MD;  Location: [facility];  Service: Orthopedics;  Laterality:  Left;   TOTAL HIP ARTHROPLASTY Bilateral LEFT 2008 AND RIGHT 2010   TOTAL KNEE ARTHROPLASTY Left 09/17/2013   Procedure: LEFT TOTAL KNEE ARTHROPLASTY;  Surgeon: Shelda Pal, MD;  Location: [facility];  Service: Orthopedics;  Laterality: Left;    Allergies  Allergen Reactions   Albuterol Other (See Comments)    "Had some rapid heartbeat in 2013 when she took it for some bronchitis"   Diltiazem Other (See Comments)    "Pre-syncope, bp went a bit low for bp on 180 mg dose"   Naproxen Swelling   Other Nausea Only and Other (See Comments)    AN UNNAMED PAIN MEDICATION CAUSED NAUSEA   Yellow Dye     #10   Cardizem [Diltiazem Hcl]  Other (See Comments)    Hypotension   Nitrofurantoin Rash and Other (See Comments)    Sept., 2018      Outpatient Encounter Medications as of 10/25/2022  Medication Sig   acetaminophen (TYLENOL) 325 MG tablet Take 650 mg by mouth every 4 (four) hours as needed.   atorvastatin (LIPITOR) 20 MG tablet Take 20 mg by mouth daily.   Cyanocobalamin (VITAMIN B 12) 500 MCG TABS Take 1,000 mg by mouth daily.   Cyanocobalamin 1000 MCG/ML KIT Inject 1,000 mcg into the skin See admin instructions. Inject 1,000 mcg into the skin every 8 weeks   escitalopram (LEXAPRO) 20 MG tablet Take 20 mg by mouth daily.   GALANTAMINE HYDROBROMIDE PO Take 16 mg by mouth daily.   memantine (NAMENDA XR) 28 MG CP24 24 hr capsule Take 1 capsule (28 mg total) by mouth daily.   omeprazole (PRILOSEC) 20 MG capsule Take 20 mg by mouth daily.   polyethylene glycol (MIRALAX / GLYCOLAX) 17 g packet Take 17 g by mouth daily as needed for mild constipation.   Probiotic Product (ALIGN) CHEW Chew 2 tablets by mouth daily.   No facility-administered encounter medications on file as of 10/25/2022.    Review of Systems  Constitutional:  Negative for fatigue and fever.  HENT:  Negative for hearing loss and trouble swallowing.   Eyes:  Negative for visual disturbance.  Respiratory:  Positive for shortness of breath. Negative for cough and wheezing.        DOE  Cardiovascular:  Negative for chest pain and leg swelling.  Gastrointestinal:  Negative for abdominal distention and abdominal pain.  Genitourinary:  Negative for dysuria, frequency, hematuria and vaginal bleeding.  Musculoskeletal:  Positive for gait problem.  Skin:  Negative for wound.  Neurological:  Positive for weakness. Negative for dizziness and light-headedness.  Psychiatric/Behavioral:  Positive for confusion and dysphoric mood. The patient is not nervous/anxious.     Immunization History  Administered Date(s) Administered   DTaP, 5 pertussis antigens  05/11/2015   Influenza Split 04/27/2009, 08/28/2009, 09/29/2010, 11/08/2011, 10/26/2012, 11/08/2013   Influenza, High Dose Seasonal PF 11/13/2021   Influenza, Quadrivalent, Recombinant, Inj, Pf 10/05/2018, 10/07/2020   Influenza,inj,Quad PF,6+ Mos 10/29/2012, 11/08/2013, 11/22/2014   Influenza-Unspecified 10/03/2015, 10/18/2016   Moderna Covid-19 Seasonal Vaccine 6 months thru 87years of age 31/23/2021, 02/27/2019, 10/01/2019, 05/13/2020, 10/13/2020, 11/30/2020   PNEUMOCOCCAL CONJUGATE-20 06/14/2022   Pneumococcal Polysaccharide-23 08/19/2002, 01/18/2005, 04/27/2009, 08/28/2009   Pneumococcal-Unspecified 05/25/2012   Td (Adult),5 Lf Tetanus Toxid, Preservative Free 08/28/2009   Tdap 04/27/2009, 12/09/2010, 05/11/2015   Zoster Recombinant(Shingrix) 06/01/2017, 06/05/2017, 08/30/2017   Zoster, Live 08/08/2003, 04/27/2009, 05/28/2016   Zoster, Unspecified 05/28/2016, 06/05/2017, 08/30/2017   Pertinent  Health Maintenance Due  Topic Date Due   INFLUENZA  VACCINE  08/18/2022   DEXA SCAN  Completed      06/23/2022   12:59 PM 07/20/2022    4:21 PM 08/22/2022   11:03 AM 10/17/2022   10:16 AM  Fall Risk  Falls in the past year? 0 0 1 1  Was there an injury with Fall? 0 0 1 0  Fall Risk Category Calculator 0 0 2 1  Patient at Risk for Falls Due to History of fall(s);Impaired balance/gait;Impaired mobility  History of fall(s);Impaired balance/gait History of fall(s);Impaired balance/gait  Fall risk Follow up Falls evaluation completed  Falls evaluation completed;Education provided Falls evaluation completed;Education provided;Falls prevention discussed   Functional Status Survey:    Vitals:   10/25/22 1352  BP: 93/60  Pulse: 70  Resp: 18  Temp: (!) 97.5 F (36.4 C)  SpO2: 92%  Weight: 114 lb 6.4 oz (51.9 kg)  Height: 5\' 6"  (1.676 m)   Body mass index is 18.46 kg/m. Physical Exam Vitals reviewed.  Constitutional:      General: She is not in acute distress. HENT:     Head:  Normocephalic.     Right Ear: There is no impacted cerumen.     Left Ear: There is no impacted cerumen.     Nose: Nose normal.     Mouth/Throat:     Mouth: Mucous membranes are moist.  Eyes:     General:        Right eye: No discharge.        Left eye: No discharge.  Cardiovascular:     Rate and Rhythm: Normal rate and regular rhythm.     Pulses: Normal pulses.     Heart sounds: Normal heart sounds.  Pulmonary:     Effort: Pulmonary effort is normal. No respiratory distress.     Breath sounds: Normal breath sounds. No wheezing.  Abdominal:     General: Bowel sounds are normal. There is no distension.     Palpations: Abdomen is soft.     Tenderness: There is no abdominal tenderness.  Musculoskeletal:     Cervical back: Neck supple.     Right lower leg: No edema.     Left lower leg: No edema.  Skin:    General: Skin is warm.     Capillary Refill: Capillary refill takes less than 2 seconds.  Neurological:     General: No focal deficit present.     Mental Status: She is alert and oriented to person, place, and time.     Motor: Weakness present.     Gait: Gait abnormal.  Psychiatric:        Mood and Affect: Mood normal.     Labs reviewed: Recent Labs    05/10/22 0452 05/13/22 0414 05/15/22 0414 05/19/22 0000 09/12/22 0000  NA 132* 134* 132* 137 140  K 3.6 3.8 4.3 4.1 4.2  CL 99 100 98 101 104  CO2 24 27 23  29* 28*  GLUCOSE 93 96 101*  --   --   BUN 15 11 12 11 16   CREATININE 0.78 0.74 0.78 0.8 0.9  CALCIUM 8.2* 8.3* 8.7*  --  9.3  PHOS  --   --  2.8  --   --    Recent Labs    05/09/22 1205 05/15/22 0414 09/12/22 0000  AST 26  --  17  ALT 18  --  9  ALKPHOS 84  --  117  BILITOT 0.9  --   --   PROT 7.3  --   --  ALBUMIN 2.7* 2.6* 3.6   Recent Labs    05/10/22 0452 05/13/22 0414 05/15/22 0414 05/19/22 0000 09/12/22 0000  WBC 7.9 6.3 8.8 7.0 6.1  NEUTROABS  --   --  5.3 4,515.00 4,166.00  HGB 10.8* 10.8* 11.2* 10.1* 12.8  HCT 32.9* 33.5* 34.4* 31*  39  MCV 99.1 98.8 97.5  --   --   PLT 201 198 263 228 191   Lab Results  Component Value Date   TSH 2.980 03/17/2021   No results found for: "HGBA1C" Lab Results  Component Value Date   CHOL 149 09/12/2022   HDL 56 09/12/2022   LDLCALC 75 09/12/2022   TRIG 98 09/12/2022    Significant Diagnostic Results in last 30 days:  No results found.  Assessment/Plan 1. Essential hypertension - continues to trend low, SBP 90-100's - manual blood pressures BID x 1 week> give report to PCP - off metoprolol 02/2022 due to low SBP - cbc/diff> future  2. Chronic obstructive pulmonary disease, unspecified COPD type (HCC) - mild DOE, otherwise asymptomatic - not on medication - cont oxygen prn qhs  3. Gastroesophageal reflux disease, unspecified whether esophagitis present - hgb stable - cont omeprazole  4. Moderate late onset Alzheimer's dementia without behavioral disturbance, psychotic disturbance, mood disturbance, or anxiety (HCC) - no behaviors - able to perform most ADLs except showering  - weights stable - cont Galantamine and Namenda  5. Recurrent depression (HCC) - no mood changes - Na+ 140 - cont Lexapro     Family/ staff Communication: plan discussed with patient and nurse  Labs/tests ordered:  manual blood pressures BID x 7 days, cbc/diff 10/10

## 2022-10-26 DIAGNOSIS — J449 Chronic obstructive pulmonary disease, unspecified: Secondary | ICD-10-CM | POA: Diagnosis not present

## 2022-10-26 DIAGNOSIS — F324 Major depressive disorder, single episode, in partial remission: Secondary | ICD-10-CM | POA: Diagnosis not present

## 2022-10-26 DIAGNOSIS — R41841 Cognitive communication deficit: Secondary | ICD-10-CM | POA: Diagnosis not present

## 2022-10-26 DIAGNOSIS — I10 Essential (primary) hypertension: Secondary | ICD-10-CM | POA: Diagnosis not present

## 2022-10-26 DIAGNOSIS — K219 Gastro-esophageal reflux disease without esophagitis: Secondary | ICD-10-CM | POA: Diagnosis not present

## 2022-10-26 DIAGNOSIS — F02A4 Dementia in other diseases classified elsewhere, mild, with anxiety: Secondary | ICD-10-CM | POA: Diagnosis not present

## 2022-10-26 DIAGNOSIS — R2681 Unsteadiness on feet: Secondary | ICD-10-CM | POA: Diagnosis not present

## 2022-10-26 DIAGNOSIS — M6281 Muscle weakness (generalized): Secondary | ICD-10-CM | POA: Diagnosis not present

## 2022-10-26 DIAGNOSIS — R4701 Aphasia: Secondary | ICD-10-CM | POA: Diagnosis not present

## 2022-10-26 DIAGNOSIS — R278 Other lack of coordination: Secondary | ICD-10-CM | POA: Diagnosis not present

## 2022-10-27 DIAGNOSIS — D649 Anemia, unspecified: Secondary | ICD-10-CM | POA: Diagnosis not present

## 2022-10-28 DIAGNOSIS — F02A4 Dementia in other diseases classified elsewhere, mild, with anxiety: Secondary | ICD-10-CM | POA: Diagnosis not present

## 2022-10-28 DIAGNOSIS — R41841 Cognitive communication deficit: Secondary | ICD-10-CM | POA: Diagnosis not present

## 2022-10-28 DIAGNOSIS — R2681 Unsteadiness on feet: Secondary | ICD-10-CM | POA: Diagnosis not present

## 2022-10-28 DIAGNOSIS — F324 Major depressive disorder, single episode, in partial remission: Secondary | ICD-10-CM | POA: Diagnosis not present

## 2022-10-28 DIAGNOSIS — R278 Other lack of coordination: Secondary | ICD-10-CM | POA: Diagnosis not present

## 2022-10-28 DIAGNOSIS — J449 Chronic obstructive pulmonary disease, unspecified: Secondary | ICD-10-CM | POA: Diagnosis not present

## 2022-10-28 DIAGNOSIS — R4701 Aphasia: Secondary | ICD-10-CM | POA: Diagnosis not present

## 2022-10-28 DIAGNOSIS — I10 Essential (primary) hypertension: Secondary | ICD-10-CM | POA: Diagnosis not present

## 2022-10-28 DIAGNOSIS — M6281 Muscle weakness (generalized): Secondary | ICD-10-CM | POA: Diagnosis not present

## 2022-10-28 DIAGNOSIS — K219 Gastro-esophageal reflux disease without esophagitis: Secondary | ICD-10-CM | POA: Diagnosis not present

## 2022-11-02 DIAGNOSIS — R278 Other lack of coordination: Secondary | ICD-10-CM | POA: Diagnosis not present

## 2022-11-02 DIAGNOSIS — R2681 Unsteadiness on feet: Secondary | ICD-10-CM | POA: Diagnosis not present

## 2022-11-02 DIAGNOSIS — J449 Chronic obstructive pulmonary disease, unspecified: Secondary | ICD-10-CM | POA: Diagnosis not present

## 2022-11-02 DIAGNOSIS — I10 Essential (primary) hypertension: Secondary | ICD-10-CM | POA: Diagnosis not present

## 2022-11-02 DIAGNOSIS — R4701 Aphasia: Secondary | ICD-10-CM | POA: Diagnosis not present

## 2022-11-02 DIAGNOSIS — F324 Major depressive disorder, single episode, in partial remission: Secondary | ICD-10-CM | POA: Diagnosis not present

## 2022-11-02 DIAGNOSIS — K219 Gastro-esophageal reflux disease without esophagitis: Secondary | ICD-10-CM | POA: Diagnosis not present

## 2022-11-02 DIAGNOSIS — F02A4 Dementia in other diseases classified elsewhere, mild, with anxiety: Secondary | ICD-10-CM | POA: Diagnosis not present

## 2022-11-02 DIAGNOSIS — M6281 Muscle weakness (generalized): Secondary | ICD-10-CM | POA: Diagnosis not present

## 2022-11-02 DIAGNOSIS — R41841 Cognitive communication deficit: Secondary | ICD-10-CM | POA: Diagnosis not present

## 2022-11-03 DIAGNOSIS — F02A4 Dementia in other diseases classified elsewhere, mild, with anxiety: Secondary | ICD-10-CM | POA: Diagnosis not present

## 2022-11-03 DIAGNOSIS — R278 Other lack of coordination: Secondary | ICD-10-CM | POA: Diagnosis not present

## 2022-11-03 DIAGNOSIS — M6281 Muscle weakness (generalized): Secondary | ICD-10-CM | POA: Diagnosis not present

## 2022-11-03 DIAGNOSIS — R2681 Unsteadiness on feet: Secondary | ICD-10-CM | POA: Diagnosis not present

## 2022-11-04 DIAGNOSIS — R4701 Aphasia: Secondary | ICD-10-CM | POA: Diagnosis not present

## 2022-11-04 DIAGNOSIS — R2681 Unsteadiness on feet: Secondary | ICD-10-CM | POA: Diagnosis not present

## 2022-11-04 DIAGNOSIS — J449 Chronic obstructive pulmonary disease, unspecified: Secondary | ICD-10-CM | POA: Diagnosis not present

## 2022-11-04 DIAGNOSIS — F02A4 Dementia in other diseases classified elsewhere, mild, with anxiety: Secondary | ICD-10-CM | POA: Diagnosis not present

## 2022-11-04 DIAGNOSIS — M6281 Muscle weakness (generalized): Secondary | ICD-10-CM | POA: Diagnosis not present

## 2022-11-04 DIAGNOSIS — K219 Gastro-esophageal reflux disease without esophagitis: Secondary | ICD-10-CM | POA: Diagnosis not present

## 2022-11-04 DIAGNOSIS — R278 Other lack of coordination: Secondary | ICD-10-CM | POA: Diagnosis not present

## 2022-11-04 DIAGNOSIS — F324 Major depressive disorder, single episode, in partial remission: Secondary | ICD-10-CM | POA: Diagnosis not present

## 2022-11-04 DIAGNOSIS — R41841 Cognitive communication deficit: Secondary | ICD-10-CM | POA: Diagnosis not present

## 2022-11-04 DIAGNOSIS — I10 Essential (primary) hypertension: Secondary | ICD-10-CM | POA: Diagnosis not present

## 2022-11-08 DIAGNOSIS — F324 Major depressive disorder, single episode, in partial remission: Secondary | ICD-10-CM | POA: Diagnosis not present

## 2022-11-08 DIAGNOSIS — R41841 Cognitive communication deficit: Secondary | ICD-10-CM | POA: Diagnosis not present

## 2022-11-08 DIAGNOSIS — J449 Chronic obstructive pulmonary disease, unspecified: Secondary | ICD-10-CM | POA: Diagnosis not present

## 2022-11-08 DIAGNOSIS — R278 Other lack of coordination: Secondary | ICD-10-CM | POA: Diagnosis not present

## 2022-11-08 DIAGNOSIS — I10 Essential (primary) hypertension: Secondary | ICD-10-CM | POA: Diagnosis not present

## 2022-11-08 DIAGNOSIS — K219 Gastro-esophageal reflux disease without esophagitis: Secondary | ICD-10-CM | POA: Diagnosis not present

## 2022-11-08 DIAGNOSIS — R4701 Aphasia: Secondary | ICD-10-CM | POA: Diagnosis not present

## 2022-11-08 DIAGNOSIS — F02A4 Dementia in other diseases classified elsewhere, mild, with anxiety: Secondary | ICD-10-CM | POA: Diagnosis not present

## 2022-11-14 DIAGNOSIS — K219 Gastro-esophageal reflux disease without esophagitis: Secondary | ICD-10-CM | POA: Diagnosis not present

## 2022-11-14 DIAGNOSIS — R4701 Aphasia: Secondary | ICD-10-CM | POA: Diagnosis not present

## 2022-11-14 DIAGNOSIS — R41841 Cognitive communication deficit: Secondary | ICD-10-CM | POA: Diagnosis not present

## 2022-11-14 DIAGNOSIS — F02A4 Dementia in other diseases classified elsewhere, mild, with anxiety: Secondary | ICD-10-CM | POA: Diagnosis not present

## 2022-11-14 DIAGNOSIS — J449 Chronic obstructive pulmonary disease, unspecified: Secondary | ICD-10-CM | POA: Diagnosis not present

## 2022-11-14 DIAGNOSIS — I10 Essential (primary) hypertension: Secondary | ICD-10-CM | POA: Diagnosis not present

## 2022-11-14 DIAGNOSIS — F324 Major depressive disorder, single episode, in partial remission: Secondary | ICD-10-CM | POA: Diagnosis not present

## 2022-11-14 DIAGNOSIS — R278 Other lack of coordination: Secondary | ICD-10-CM | POA: Diagnosis not present

## 2022-11-17 DIAGNOSIS — R4701 Aphasia: Secondary | ICD-10-CM | POA: Diagnosis not present

## 2022-11-17 DIAGNOSIS — R278 Other lack of coordination: Secondary | ICD-10-CM | POA: Diagnosis not present

## 2022-11-17 DIAGNOSIS — R41841 Cognitive communication deficit: Secondary | ICD-10-CM | POA: Diagnosis not present

## 2022-11-17 DIAGNOSIS — K219 Gastro-esophageal reflux disease without esophagitis: Secondary | ICD-10-CM | POA: Diagnosis not present

## 2022-11-17 DIAGNOSIS — I10 Essential (primary) hypertension: Secondary | ICD-10-CM | POA: Diagnosis not present

## 2022-11-17 DIAGNOSIS — F02A4 Dementia in other diseases classified elsewhere, mild, with anxiety: Secondary | ICD-10-CM | POA: Diagnosis not present

## 2022-11-17 DIAGNOSIS — J449 Chronic obstructive pulmonary disease, unspecified: Secondary | ICD-10-CM | POA: Diagnosis not present

## 2022-11-17 DIAGNOSIS — F324 Major depressive disorder, single episode, in partial remission: Secondary | ICD-10-CM | POA: Diagnosis not present

## 2022-11-21 DIAGNOSIS — R41841 Cognitive communication deficit: Secondary | ICD-10-CM | POA: Diagnosis not present

## 2022-11-21 DIAGNOSIS — R4701 Aphasia: Secondary | ICD-10-CM | POA: Diagnosis not present

## 2022-11-21 DIAGNOSIS — R278 Other lack of coordination: Secondary | ICD-10-CM | POA: Diagnosis not present

## 2022-11-21 DIAGNOSIS — J449 Chronic obstructive pulmonary disease, unspecified: Secondary | ICD-10-CM | POA: Diagnosis not present

## 2022-11-21 DIAGNOSIS — F02A4 Dementia in other diseases classified elsewhere, mild, with anxiety: Secondary | ICD-10-CM | POA: Diagnosis not present

## 2022-11-21 DIAGNOSIS — K219 Gastro-esophageal reflux disease without esophagitis: Secondary | ICD-10-CM | POA: Diagnosis not present

## 2022-11-21 DIAGNOSIS — I10 Essential (primary) hypertension: Secondary | ICD-10-CM | POA: Diagnosis not present

## 2022-11-21 DIAGNOSIS — F324 Major depressive disorder, single episode, in partial remission: Secondary | ICD-10-CM | POA: Diagnosis not present

## 2022-11-23 DIAGNOSIS — I10 Essential (primary) hypertension: Secondary | ICD-10-CM | POA: Diagnosis not present

## 2022-11-23 DIAGNOSIS — F324 Major depressive disorder, single episode, in partial remission: Secondary | ICD-10-CM | POA: Diagnosis not present

## 2022-11-23 DIAGNOSIS — J449 Chronic obstructive pulmonary disease, unspecified: Secondary | ICD-10-CM | POA: Diagnosis not present

## 2022-11-23 DIAGNOSIS — R4701 Aphasia: Secondary | ICD-10-CM | POA: Diagnosis not present

## 2022-11-23 DIAGNOSIS — R278 Other lack of coordination: Secondary | ICD-10-CM | POA: Diagnosis not present

## 2022-11-23 DIAGNOSIS — R41841 Cognitive communication deficit: Secondary | ICD-10-CM | POA: Diagnosis not present

## 2022-11-23 DIAGNOSIS — K219 Gastro-esophageal reflux disease without esophagitis: Secondary | ICD-10-CM | POA: Diagnosis not present

## 2022-11-23 DIAGNOSIS — F02A4 Dementia in other diseases classified elsewhere, mild, with anxiety: Secondary | ICD-10-CM | POA: Diagnosis not present

## 2022-11-25 DIAGNOSIS — R278 Other lack of coordination: Secondary | ICD-10-CM | POA: Diagnosis not present

## 2022-11-25 DIAGNOSIS — R4701 Aphasia: Secondary | ICD-10-CM | POA: Diagnosis not present

## 2022-11-25 DIAGNOSIS — K219 Gastro-esophageal reflux disease without esophagitis: Secondary | ICD-10-CM | POA: Diagnosis not present

## 2022-11-25 DIAGNOSIS — F02A4 Dementia in other diseases classified elsewhere, mild, with anxiety: Secondary | ICD-10-CM | POA: Diagnosis not present

## 2022-11-25 DIAGNOSIS — I10 Essential (primary) hypertension: Secondary | ICD-10-CM | POA: Diagnosis not present

## 2022-11-25 DIAGNOSIS — R41841 Cognitive communication deficit: Secondary | ICD-10-CM | POA: Diagnosis not present

## 2022-11-25 DIAGNOSIS — J449 Chronic obstructive pulmonary disease, unspecified: Secondary | ICD-10-CM | POA: Diagnosis not present

## 2022-11-25 DIAGNOSIS — F324 Major depressive disorder, single episode, in partial remission: Secondary | ICD-10-CM | POA: Diagnosis not present

## 2022-11-30 DIAGNOSIS — R278 Other lack of coordination: Secondary | ICD-10-CM | POA: Diagnosis not present

## 2022-11-30 DIAGNOSIS — I10 Essential (primary) hypertension: Secondary | ICD-10-CM | POA: Diagnosis not present

## 2022-11-30 DIAGNOSIS — J449 Chronic obstructive pulmonary disease, unspecified: Secondary | ICD-10-CM | POA: Diagnosis not present

## 2022-11-30 DIAGNOSIS — R41841 Cognitive communication deficit: Secondary | ICD-10-CM | POA: Diagnosis not present

## 2022-11-30 DIAGNOSIS — R4701 Aphasia: Secondary | ICD-10-CM | POA: Diagnosis not present

## 2022-11-30 DIAGNOSIS — K219 Gastro-esophageal reflux disease without esophagitis: Secondary | ICD-10-CM | POA: Diagnosis not present

## 2022-11-30 DIAGNOSIS — F02A4 Dementia in other diseases classified elsewhere, mild, with anxiety: Secondary | ICD-10-CM | POA: Diagnosis not present

## 2022-11-30 DIAGNOSIS — F324 Major depressive disorder, single episode, in partial remission: Secondary | ICD-10-CM | POA: Diagnosis not present

## 2022-12-07 DIAGNOSIS — I10 Essential (primary) hypertension: Secondary | ICD-10-CM | POA: Diagnosis not present

## 2022-12-07 DIAGNOSIS — R41841 Cognitive communication deficit: Secondary | ICD-10-CM | POA: Diagnosis not present

## 2022-12-07 DIAGNOSIS — R4701 Aphasia: Secondary | ICD-10-CM | POA: Diagnosis not present

## 2022-12-07 DIAGNOSIS — F02A4 Dementia in other diseases classified elsewhere, mild, with anxiety: Secondary | ICD-10-CM | POA: Diagnosis not present

## 2022-12-07 DIAGNOSIS — R278 Other lack of coordination: Secondary | ICD-10-CM | POA: Diagnosis not present

## 2022-12-07 DIAGNOSIS — F324 Major depressive disorder, single episode, in partial remission: Secondary | ICD-10-CM | POA: Diagnosis not present

## 2022-12-07 DIAGNOSIS — K219 Gastro-esophageal reflux disease without esophagitis: Secondary | ICD-10-CM | POA: Diagnosis not present

## 2022-12-07 DIAGNOSIS — J449 Chronic obstructive pulmonary disease, unspecified: Secondary | ICD-10-CM | POA: Diagnosis not present

## 2022-12-09 DIAGNOSIS — R4701 Aphasia: Secondary | ICD-10-CM | POA: Diagnosis not present

## 2022-12-09 DIAGNOSIS — J449 Chronic obstructive pulmonary disease, unspecified: Secondary | ICD-10-CM | POA: Diagnosis not present

## 2022-12-09 DIAGNOSIS — K219 Gastro-esophageal reflux disease without esophagitis: Secondary | ICD-10-CM | POA: Diagnosis not present

## 2022-12-09 DIAGNOSIS — R278 Other lack of coordination: Secondary | ICD-10-CM | POA: Diagnosis not present

## 2022-12-09 DIAGNOSIS — R41841 Cognitive communication deficit: Secondary | ICD-10-CM | POA: Diagnosis not present

## 2022-12-09 DIAGNOSIS — F324 Major depressive disorder, single episode, in partial remission: Secondary | ICD-10-CM | POA: Diagnosis not present

## 2022-12-09 DIAGNOSIS — I10 Essential (primary) hypertension: Secondary | ICD-10-CM | POA: Diagnosis not present

## 2022-12-09 DIAGNOSIS — F02A4 Dementia in other diseases classified elsewhere, mild, with anxiety: Secondary | ICD-10-CM | POA: Diagnosis not present

## 2022-12-12 DIAGNOSIS — J449 Chronic obstructive pulmonary disease, unspecified: Secondary | ICD-10-CM | POA: Diagnosis not present

## 2022-12-12 DIAGNOSIS — F324 Major depressive disorder, single episode, in partial remission: Secondary | ICD-10-CM | POA: Diagnosis not present

## 2022-12-12 DIAGNOSIS — F02A4 Dementia in other diseases classified elsewhere, mild, with anxiety: Secondary | ICD-10-CM | POA: Diagnosis not present

## 2022-12-12 DIAGNOSIS — I10 Essential (primary) hypertension: Secondary | ICD-10-CM | POA: Diagnosis not present

## 2022-12-12 DIAGNOSIS — R41841 Cognitive communication deficit: Secondary | ICD-10-CM | POA: Diagnosis not present

## 2022-12-12 DIAGNOSIS — K219 Gastro-esophageal reflux disease without esophagitis: Secondary | ICD-10-CM | POA: Diagnosis not present

## 2022-12-12 DIAGNOSIS — R278 Other lack of coordination: Secondary | ICD-10-CM | POA: Diagnosis not present

## 2022-12-12 DIAGNOSIS — R4701 Aphasia: Secondary | ICD-10-CM | POA: Diagnosis not present

## 2022-12-20 DIAGNOSIS — R278 Other lack of coordination: Secondary | ICD-10-CM | POA: Diagnosis not present

## 2022-12-20 DIAGNOSIS — R4701 Aphasia: Secondary | ICD-10-CM | POA: Diagnosis not present

## 2022-12-20 DIAGNOSIS — F02A4 Dementia in other diseases classified elsewhere, mild, with anxiety: Secondary | ICD-10-CM | POA: Diagnosis not present

## 2022-12-20 DIAGNOSIS — J449 Chronic obstructive pulmonary disease, unspecified: Secondary | ICD-10-CM | POA: Diagnosis not present

## 2022-12-20 DIAGNOSIS — K219 Gastro-esophageal reflux disease without esophagitis: Secondary | ICD-10-CM | POA: Diagnosis not present

## 2022-12-20 DIAGNOSIS — F324 Major depressive disorder, single episode, in partial remission: Secondary | ICD-10-CM | POA: Diagnosis not present

## 2022-12-20 DIAGNOSIS — R41841 Cognitive communication deficit: Secondary | ICD-10-CM | POA: Diagnosis not present

## 2022-12-20 DIAGNOSIS — I10 Essential (primary) hypertension: Secondary | ICD-10-CM | POA: Diagnosis not present

## 2022-12-22 DIAGNOSIS — R4701 Aphasia: Secondary | ICD-10-CM | POA: Diagnosis not present

## 2022-12-22 DIAGNOSIS — R278 Other lack of coordination: Secondary | ICD-10-CM | POA: Diagnosis not present

## 2022-12-22 DIAGNOSIS — I10 Essential (primary) hypertension: Secondary | ICD-10-CM | POA: Diagnosis not present

## 2022-12-22 DIAGNOSIS — F324 Major depressive disorder, single episode, in partial remission: Secondary | ICD-10-CM | POA: Diagnosis not present

## 2022-12-22 DIAGNOSIS — R41841 Cognitive communication deficit: Secondary | ICD-10-CM | POA: Diagnosis not present

## 2022-12-22 DIAGNOSIS — J449 Chronic obstructive pulmonary disease, unspecified: Secondary | ICD-10-CM | POA: Diagnosis not present

## 2022-12-22 DIAGNOSIS — K219 Gastro-esophageal reflux disease without esophagitis: Secondary | ICD-10-CM | POA: Diagnosis not present

## 2022-12-22 DIAGNOSIS — F02A4 Dementia in other diseases classified elsewhere, mild, with anxiety: Secondary | ICD-10-CM | POA: Diagnosis not present

## 2022-12-26 DIAGNOSIS — J449 Chronic obstructive pulmonary disease, unspecified: Secondary | ICD-10-CM | POA: Diagnosis not present

## 2022-12-26 DIAGNOSIS — R278 Other lack of coordination: Secondary | ICD-10-CM | POA: Diagnosis not present

## 2022-12-26 DIAGNOSIS — I10 Essential (primary) hypertension: Secondary | ICD-10-CM | POA: Diagnosis not present

## 2022-12-26 DIAGNOSIS — R4701 Aphasia: Secondary | ICD-10-CM | POA: Diagnosis not present

## 2022-12-26 DIAGNOSIS — F02A4 Dementia in other diseases classified elsewhere, mild, with anxiety: Secondary | ICD-10-CM | POA: Diagnosis not present

## 2022-12-26 DIAGNOSIS — K219 Gastro-esophageal reflux disease without esophagitis: Secondary | ICD-10-CM | POA: Diagnosis not present

## 2022-12-26 DIAGNOSIS — F324 Major depressive disorder, single episode, in partial remission: Secondary | ICD-10-CM | POA: Diagnosis not present

## 2022-12-26 DIAGNOSIS — R41841 Cognitive communication deficit: Secondary | ICD-10-CM | POA: Diagnosis not present

## 2022-12-27 ENCOUNTER — Encounter: Payer: Self-pay | Admitting: Adult Health

## 2022-12-27 ENCOUNTER — Non-Acute Institutional Stay: Payer: Medicare HMO | Admitting: Adult Health

## 2022-12-27 DIAGNOSIS — M25552 Pain in left hip: Secondary | ICD-10-CM

## 2022-12-27 DIAGNOSIS — F0390 Unspecified dementia without behavioral disturbance: Secondary | ICD-10-CM | POA: Diagnosis not present

## 2022-12-27 NOTE — Progress Notes (Signed)
Location:  [facility] Room Number: AL09-A Place of Service:  ALF [phone]) Provider:  Kenard Gower, DNP, FNP-BC  Patient Care Team: Mahlon Gammon, MD as PCP - General (Internal Medicine) Quintella Reichert, MD as PCP - Cardiology (Cardiology)  Extended Emergency Contact Information Primary Emergency Contact: Ruch,Holly Address: [address]          [place], [place] [address] [place] Phone: [phone] Relation: Niece Secondary Emergency Contact: Ruch,George Address: [address]          [place], [place] [address] [place] Home Phone: [phone] Mobile Phone: [phone] Relation: Nephew  Code Status:  DNR  Goals of care: Advanced Directive information    12/27/2022    4:06 PM  Advanced Directives  Does Patient Have a Medical Advance Directive? Yes  Type of Estate agent of [place];Out of facility DNR (pink MOST or yellow form);Living will  Does patient want to make changes to medical advance directive? No - Patient declined  Copy of Healthcare Power of Attorney in Chart? No - copy requested  Would patient like information on creating a medical advance directive? No - Patient declined     Chief Complaint  Patient presents with   Acute Visit     complains of pain on left hip    HPI:  Pt is a 89 y.o. female seen today for an acute visit due to complaints of left hip pain. She is a resident of [facility]. She stated that she fell sitting down on the floor yesterday and was able to get up on her own. Staff reported that patient is usually needing assistance in getting up. She was sitting in her couch and needed assistance to get up to her walker. No noted bruise nor open wound on her buttocks. No tenderness on her buttocks. Niece was at bedside, was visiting today. Patient has dementia and takes Galantamine and Namenda.   Past Medical History:  Diagnosis Date   Anxiety     Bradycardia    asymptomatic   Breast CA (HCC) 1993 OR 1994   LEFT, SURGERY AND RADIATION DONE   COPD (chronic obstructive pulmonary disease) (HCC)    MILD, NO INHALERS USED   DEGENERATIVE JOINT DISEASE, KNEE    Family history of anesthesia complication    NEPHEW NAUSEA/VOMITING   GENU VALGUM    Hyperlipidemia    Hypertension    Numbness of leg 2011   just left shin   OTHER ACQUIRED DEFORMITY OF ANKLE AND FOOT OTHER    Pneumonia 2013   X 2   PVC's (premature ventricular contractions)    ROTATOR CUFF SYNDROME, LEFT    CANNOT LIFT LEFT ARM ALL THE WAY UP   UNEQUAL LEG LENGTH    UTI (urinary tract infection)    STARTED AUGMENTIN  ON 09-10-13   Past Surgical History:  Procedure Laterality Date   APPENDECTOMY  AGE 22   BREAST LUMPECTOMY Left 1991   radiation   BREAST SURGERY Left 1993 OR 1994   LUMPECTOMY AND RADIATION DONE   JOINT REPLACEMENT     NASAL SINUS SURGERY  2006   REVERSE SHOULDER ARTHROPLASTY Left 07/31/2015   Procedure: LEFT REVERSE SHOULDER ARTHROPLASTY;  Surgeon: Beverely Low, MD;  Location: [facility] OR;  Service: Orthopedics;  Laterality: Left;   TOTAL HIP ARTHROPLASTY Bilateral LEFT 2008 AND RIGHT 2010   TOTAL KNEE ARTHROPLASTY Left 09/17/2013   Procedure: LEFT TOTAL KNEE ARTHROPLASTY;  Surgeon: Shelda Pal, MD;  Location: [facility];  Service: Orthopedics;  Laterality: Left;    Allergies  Allergen Reactions   Albuterol Other (See Comments)    "Had some rapid heartbeat in 2013 when she took it for some bronchitis"   Diltiazem Other (See Comments)    "Pre-syncope, bp went a bit low for bp on 180 mg dose"   Naproxen Swelling   Other Nausea Only and Other (See Comments)    AN UNNAMED PAIN MEDICATION CAUSED NAUSEA   Yellow Dye     #10   Cardizem [Diltiazem Hcl] Other (See Comments)    Hypotension   Nitrofurantoin Rash and Other (See Comments)    Sept., 2018      Outpatient Encounter Medications as of 12/27/2022  Medication Sig   acetaminophen (TYLENOL) 325 MG  tablet Take 650 mg by mouth every 4 (four) hours as needed.   atorvastatin (LIPITOR) 20 MG tablet Take 20 mg by mouth daily.   Cyanocobalamin (VITAMIN B 12) 500 MCG TABS Take 1,000 mg by mouth daily.   Cyanocobalamin 1000 MCG/ML KIT Inject 1,000 mcg into the skin See admin instructions. Inject 1,000 mcg into the skin every 8 weeks   escitalopram (LEXAPRO) 20 MG tablet Take 20 mg by mouth daily.   GALANTAMINE HYDROBROMIDE PO Take 16 mg by mouth daily.   memantine (NAMENDA XR) 28 MG CP24 24 hr capsule Take 1 capsule (28 mg total) by mouth daily.   omeprazole (PRILOSEC) 20 MG capsule Take 20 mg by mouth daily.   polyethylene glycol (MIRALAX / GLYCOLAX) 17 g packet Take 17 g by mouth daily as needed for mild constipation.   Probiotic Product (ALIGN) CHEW Chew 2 tablets by mouth daily.   No facility-administered encounter medications on file as of 12/27/2022.    Review of Systems  Constitutional:  Negative for appetite change, chills, fatigue and fever.  HENT:  Negative for congestion, hearing loss, rhinorrhea and sore throat.   Eyes: Negative.   Respiratory:  Negative for cough, shortness of breath and wheezing.   Cardiovascular:  Negative for chest pain, palpitations and leg swelling.  Gastrointestinal:  Negative for abdominal pain, constipation, diarrhea, nausea and vomiting.  Genitourinary:  Negative for dysuria.  Musculoskeletal:  Negative for arthralgias, back pain and myalgias.       Left hip pain  Skin:  Negative for color change, rash and wound.  Neurological:  Negative for dizziness, weakness and headaches.  Psychiatric/Behavioral:  Negative for behavioral problems. The patient is not nervous/anxious.      Immunization History  Administered Date(s) Administered   DTaP, 5 pertussis antigens 05/11/2015   Influenza Split 04/27/2009, 08/28/2009, 09/29/2010, 11/08/2011, 10/26/2012, 11/08/2013   Influenza, High Dose Seasonal PF 11/13/2021, 11/15/2022   Influenza, Quadrivalent,  Recombinant, Inj, Pf 10/05/2018, 10/07/2020   Influenza,inj,Quad PF,6+ Mos 10/29/2012, 11/08/2013, 11/22/2014   Influenza-Unspecified 10/03/2015, 10/18/2016   Moderna Covid-19 Fall Seasonal Vaccine 84yrs & older 11/29/2022   Moderna Covid-19 Seasonal Vaccine 6 months thru 87years of age 64/23/2021, 02/27/2019, 10/01/2019, 05/13/2020, 10/13/2020, 11/30/2020   PNEUMOCOCCAL CONJUGATE-20 06/14/2022   Pneumococcal Polysaccharide-23 08/19/2002, 01/18/2005, 04/27/2009, 08/28/2009   Pneumococcal-Unspecified 05/25/2012   Td (Adult),5 Lf Tetanus Toxid, Preservative Free 08/28/2009   Tdap 04/27/2009, 12/09/2010, 05/11/2015   Zoster Recombinant(Shingrix) 06/01/2017, 06/05/2017, 08/30/2017   Zoster, Live 08/08/2003, 04/27/2009, 05/28/2016   Zoster, Unspecified 05/28/2016, 06/05/2017, 08/30/2017   Pertinent  Health Maintenance Due  Topic Date Due   INFLUENZA VACCINE  Completed   DEXA SCAN  Completed  06/23/2022   12:59 PM 07/20/2022    4:21 PM 08/22/2022   11:03 AM 10/17/2022   10:16 AM  Fall Risk  Falls in the past year? 0 0 1 1  Was there an injury with Fall? 0 0 1 0  Fall Risk Category Calculator 0 0 2 1  Patient at Risk for Falls Due to History of fall(s);Impaired balance/gait;Impaired mobility  History of fall(s);Impaired balance/gait History of fall(s);Impaired balance/gait  Fall risk Follow up Falls evaluation completed  Falls evaluation completed;Education provided Falls evaluation completed;Education provided;Falls prevention discussed     Vitals:   12/27/22 1528  BP: 104/77  Pulse: 78  Resp: 20  Temp: 98.8 F (37.1 C)  SpO2: 95%  Weight: 128 lb (58.1 kg)  Height: 5\' 6"  (1.676 m)   Body mass index is 20.66 kg/m.  Physical Exam Constitutional:      General: She is not in acute distress.    Appearance: Normal appearance.  HENT:     Head: Normocephalic and atraumatic.     Nose: Nose normal.     Mouth/Throat:     Mouth: Mucous membranes are moist.  Eyes:      Conjunctiva/sclera: Conjunctivae normal.  Cardiovascular:     Rate and Rhythm: Normal rate and regular rhythm.  Pulmonary:     Effort: Pulmonary effort is normal.     Breath sounds: Normal breath sounds.  Abdominal:     General: Bowel sounds are normal.     Palpations: Abdomen is soft.  Musculoskeletal:        General: Normal range of motion.     Cervical back: Normal range of motion.  Skin:    General: Skin is warm and dry.  Neurological:     Mental Status: Mental status is at baseline.  Psychiatric:        Mood and Affect: Mood normal.        Behavior: Behavior normal.        Labs reviewed: Recent Labs    05/10/22 0452 05/13/22 0414 05/15/22 0414 05/19/22 0000 09/12/22 0000  NA 132* 134* 132* 137 140  K 3.6 3.8 4.3 4.1 4.2  CL 99 100 98 101 104  CO2 24 27 23  29* 28*  GLUCOSE 93 96 101*  --   --   BUN 15 11 12 11 16   CREATININE 0.78 0.74 0.78 0.8 0.9  CALCIUM 8.2* 8.3* 8.7*  --  9.3  PHOS  --   --  2.8  --   --    Recent Labs    05/09/22 1205 05/15/22 0414 09/12/22 0000  AST 26  --  17  ALT 18  --  9  ALKPHOS 84  --  117  BILITOT 0.9  --   --   PROT 7.3  --   --   ALBUMIN 2.7* 2.6* 3.6   Recent Labs    05/10/22 0452 05/13/22 0414 05/15/22 0414 05/19/22 0000 09/12/22 0000  WBC 7.9 6.3 8.8 7.0 6.1  NEUTROABS  --   --  5.3 4,515.00 4,166.00  HGB 10.8* 10.8* 11.2* 10.1* 12.8  HCT 32.9* 33.5* 34.4* 31* 39  MCV 99.1 98.8 97.5  --   --   PLT 201 198 263 228 191   Lab Results  Component Value Date   TSH 2.980 03/17/2021   No results found for: "HGBA1C" Lab Results  Component Value Date   CHOL 149 09/12/2022   HDL 56 09/12/2022   LDLCALC 75 09/12/2022   TRIG 98  09/12/2022    Significant Diagnostic Results in last 30 days:  No results found.  Assessment/Plan  1. Left hip pain (Primary) -  no bruising, no open wound, no redness -  continue Acetaminophen PRN  2. Senile dementia (HCC) -  continue Namenda and Galantamine -  fall  precautions   Family/ staff Communication: Discussed plan of care with resident and charge nurse  Labs/tests ordered:  None    Kenard Gower, DNP, MSN, FNP-BC [facility] [phone] (Monday-Friday 8:00 a.m. - 5:00 p.m.) [phone] (after hours)

## 2022-12-29 DIAGNOSIS — I10 Essential (primary) hypertension: Secondary | ICD-10-CM | POA: Diagnosis not present

## 2022-12-29 DIAGNOSIS — R41841 Cognitive communication deficit: Secondary | ICD-10-CM | POA: Diagnosis not present

## 2022-12-29 DIAGNOSIS — R4701 Aphasia: Secondary | ICD-10-CM | POA: Diagnosis not present

## 2022-12-29 DIAGNOSIS — F02A4 Dementia in other diseases classified elsewhere, mild, with anxiety: Secondary | ICD-10-CM | POA: Diagnosis not present

## 2022-12-29 DIAGNOSIS — K219 Gastro-esophageal reflux disease without esophagitis: Secondary | ICD-10-CM | POA: Diagnosis not present

## 2022-12-29 DIAGNOSIS — J449 Chronic obstructive pulmonary disease, unspecified: Secondary | ICD-10-CM | POA: Diagnosis not present

## 2022-12-29 DIAGNOSIS — R278 Other lack of coordination: Secondary | ICD-10-CM | POA: Diagnosis not present

## 2022-12-29 DIAGNOSIS — F324 Major depressive disorder, single episode, in partial remission: Secondary | ICD-10-CM | POA: Diagnosis not present

## 2023-01-03 DIAGNOSIS — R41841 Cognitive communication deficit: Secondary | ICD-10-CM | POA: Diagnosis not present

## 2023-01-03 DIAGNOSIS — I10 Essential (primary) hypertension: Secondary | ICD-10-CM | POA: Diagnosis not present

## 2023-01-03 DIAGNOSIS — R278 Other lack of coordination: Secondary | ICD-10-CM | POA: Diagnosis not present

## 2023-01-03 DIAGNOSIS — F02A4 Dementia in other diseases classified elsewhere, mild, with anxiety: Secondary | ICD-10-CM | POA: Diagnosis not present

## 2023-01-03 DIAGNOSIS — F324 Major depressive disorder, single episode, in partial remission: Secondary | ICD-10-CM | POA: Diagnosis not present

## 2023-01-03 DIAGNOSIS — R4701 Aphasia: Secondary | ICD-10-CM | POA: Diagnosis not present

## 2023-01-03 DIAGNOSIS — J449 Chronic obstructive pulmonary disease, unspecified: Secondary | ICD-10-CM | POA: Diagnosis not present

## 2023-01-03 DIAGNOSIS — K219 Gastro-esophageal reflux disease without esophagitis: Secondary | ICD-10-CM | POA: Diagnosis not present

## 2023-01-05 DIAGNOSIS — F324 Major depressive disorder, single episode, in partial remission: Secondary | ICD-10-CM | POA: Diagnosis not present

## 2023-01-05 DIAGNOSIS — K219 Gastro-esophageal reflux disease without esophagitis: Secondary | ICD-10-CM | POA: Diagnosis not present

## 2023-01-05 DIAGNOSIS — R4701 Aphasia: Secondary | ICD-10-CM | POA: Diagnosis not present

## 2023-01-05 DIAGNOSIS — I10 Essential (primary) hypertension: Secondary | ICD-10-CM | POA: Diagnosis not present

## 2023-01-05 DIAGNOSIS — R278 Other lack of coordination: Secondary | ICD-10-CM | POA: Diagnosis not present

## 2023-01-05 DIAGNOSIS — J449 Chronic obstructive pulmonary disease, unspecified: Secondary | ICD-10-CM | POA: Diagnosis not present

## 2023-01-05 DIAGNOSIS — F02A4 Dementia in other diseases classified elsewhere, mild, with anxiety: Secondary | ICD-10-CM | POA: Diagnosis not present

## 2023-01-05 DIAGNOSIS — R41841 Cognitive communication deficit: Secondary | ICD-10-CM | POA: Diagnosis not present

## 2023-01-06 ENCOUNTER — Telehealth: Payer: Self-pay | Admitting: Pulmonary Disease

## 2023-01-06 NOTE — Telephone Encounter (Signed)
I was calling PT to sched FU and her niece will make appt after speaking w/someone about her 51. She states she was supposed to be getting an )2 unit and there have been several issues with [facility]. Please call to advise after researching.

## 2023-01-12 DIAGNOSIS — R4701 Aphasia: Secondary | ICD-10-CM | POA: Diagnosis not present

## 2023-01-12 DIAGNOSIS — R278 Other lack of coordination: Secondary | ICD-10-CM | POA: Diagnosis not present

## 2023-01-12 DIAGNOSIS — R41841 Cognitive communication deficit: Secondary | ICD-10-CM | POA: Diagnosis not present

## 2023-01-12 DIAGNOSIS — I10 Essential (primary) hypertension: Secondary | ICD-10-CM | POA: Diagnosis not present

## 2023-01-12 DIAGNOSIS — F02A4 Dementia in other diseases classified elsewhere, mild, with anxiety: Secondary | ICD-10-CM | POA: Diagnosis not present

## 2023-01-12 DIAGNOSIS — F324 Major depressive disorder, single episode, in partial remission: Secondary | ICD-10-CM | POA: Diagnosis not present

## 2023-01-12 DIAGNOSIS — J449 Chronic obstructive pulmonary disease, unspecified: Secondary | ICD-10-CM | POA: Diagnosis not present

## 2023-01-12 DIAGNOSIS — K219 Gastro-esophageal reflux disease without esophagitis: Secondary | ICD-10-CM | POA: Diagnosis not present

## 2023-01-12 NOTE — Telephone Encounter (Signed)
I tried to call [name] [name] but got her answering machine. There was no "beep" so I could not leave a message.

## 2023-01-13 DIAGNOSIS — I10 Essential (primary) hypertension: Secondary | ICD-10-CM | POA: Diagnosis not present

## 2023-01-13 DIAGNOSIS — R4701 Aphasia: Secondary | ICD-10-CM | POA: Diagnosis not present

## 2023-01-13 DIAGNOSIS — F324 Major depressive disorder, single episode, in partial remission: Secondary | ICD-10-CM | POA: Diagnosis not present

## 2023-01-13 DIAGNOSIS — R41841 Cognitive communication deficit: Secondary | ICD-10-CM | POA: Diagnosis not present

## 2023-01-13 DIAGNOSIS — R278 Other lack of coordination: Secondary | ICD-10-CM | POA: Diagnosis not present

## 2023-01-13 DIAGNOSIS — J449 Chronic obstructive pulmonary disease, unspecified: Secondary | ICD-10-CM | POA: Diagnosis not present

## 2023-01-13 DIAGNOSIS — F02A4 Dementia in other diseases classified elsewhere, mild, with anxiety: Secondary | ICD-10-CM | POA: Diagnosis not present

## 2023-01-13 DIAGNOSIS — K219 Gastro-esophageal reflux disease without esophagitis: Secondary | ICD-10-CM | POA: Diagnosis not present

## 2023-01-13 NOTE — Telephone Encounter (Signed)
I tried calling the pt's niece, Jeanice Lim, ok per DPR  There was no answer and no option to leave msg   The pt needs to schedule f/u

## 2023-01-16 NOTE — Telephone Encounter (Signed)
Per office protocol; closing encounter after 2 attempts to reach PT.

## 2023-01-17 DIAGNOSIS — F324 Major depressive disorder, single episode, in partial remission: Secondary | ICD-10-CM | POA: Diagnosis not present

## 2023-01-17 DIAGNOSIS — F02A4 Dementia in other diseases classified elsewhere, mild, with anxiety: Secondary | ICD-10-CM | POA: Diagnosis not present

## 2023-01-17 DIAGNOSIS — R4701 Aphasia: Secondary | ICD-10-CM | POA: Diagnosis not present

## 2023-01-17 DIAGNOSIS — I10 Essential (primary) hypertension: Secondary | ICD-10-CM | POA: Diagnosis not present

## 2023-01-17 DIAGNOSIS — R41841 Cognitive communication deficit: Secondary | ICD-10-CM | POA: Diagnosis not present

## 2023-01-17 DIAGNOSIS — R278 Other lack of coordination: Secondary | ICD-10-CM | POA: Diagnosis not present

## 2023-01-17 DIAGNOSIS — K219 Gastro-esophageal reflux disease without esophagitis: Secondary | ICD-10-CM | POA: Diagnosis not present

## 2023-01-17 DIAGNOSIS — J449 Chronic obstructive pulmonary disease, unspecified: Secondary | ICD-10-CM | POA: Diagnosis not present

## 2023-02-01 ENCOUNTER — Encounter: Payer: Self-pay | Admitting: Internal Medicine

## 2023-02-01 ENCOUNTER — Non-Acute Institutional Stay: Payer: Medicare Other | Admitting: Internal Medicine

## 2023-02-01 DIAGNOSIS — E7800 Pure hypercholesterolemia, unspecified: Secondary | ICD-10-CM | POA: Insufficient documentation

## 2023-02-01 DIAGNOSIS — A09 Infectious gastroenteritis and colitis, unspecified: Secondary | ICD-10-CM | POA: Insufficient documentation

## 2023-02-01 DIAGNOSIS — J449 Chronic obstructive pulmonary disease, unspecified: Secondary | ICD-10-CM

## 2023-02-01 DIAGNOSIS — M25559 Pain in unspecified hip: Secondary | ICD-10-CM | POA: Insufficient documentation

## 2023-02-01 DIAGNOSIS — I10 Essential (primary) hypertension: Secondary | ICD-10-CM | POA: Diagnosis not present

## 2023-02-01 DIAGNOSIS — K219 Gastro-esophageal reflux disease without esophagitis: Secondary | ICD-10-CM | POA: Diagnosis not present

## 2023-02-01 DIAGNOSIS — F339 Major depressive disorder, recurrent, unspecified: Secondary | ICD-10-CM

## 2023-02-01 DIAGNOSIS — G301 Alzheimer's disease with late onset: Secondary | ICD-10-CM

## 2023-02-01 DIAGNOSIS — R600 Localized edema: Secondary | ICD-10-CM

## 2023-02-01 DIAGNOSIS — F02B0 Dementia in other diseases classified elsewhere, moderate, without behavioral disturbance, psychotic disturbance, mood disturbance, and anxiety: Secondary | ICD-10-CM

## 2023-02-01 DIAGNOSIS — N1832 Chronic kidney disease, stage 3b: Secondary | ICD-10-CM | POA: Insufficient documentation

## 2023-02-01 DIAGNOSIS — I495 Sick sinus syndrome: Secondary | ICD-10-CM | POA: Insufficient documentation

## 2023-02-01 DIAGNOSIS — R2681 Unsteadiness on feet: Secondary | ICD-10-CM | POA: Insufficient documentation

## 2023-02-01 DIAGNOSIS — R002 Palpitations: Secondary | ICD-10-CM | POA: Insufficient documentation

## 2023-02-01 DIAGNOSIS — E538 Deficiency of other specified B group vitamins: Secondary | ICD-10-CM | POA: Insufficient documentation

## 2023-02-01 DIAGNOSIS — K580 Irritable bowel syndrome with diarrhea: Secondary | ICD-10-CM | POA: Insufficient documentation

## 2023-02-01 DIAGNOSIS — R413 Other amnesia: Secondary | ICD-10-CM | POA: Insufficient documentation

## 2023-02-01 DIAGNOSIS — R1012 Left upper quadrant pain: Secondary | ICD-10-CM | POA: Insufficient documentation

## 2023-02-01 NOTE — Progress Notes (Signed)
Location:  [facility] Room Number: AL 9A Place of Service:  ALF [phone]) Provider:  Mahlon Gammon, MD   Mahlon Gammon, MD  Patient Care Team: Mahlon Gammon, MD as PCP - General (Internal Medicine) Quintella Reichert, MD as PCP - Cardiology (Cardiology)  Extended Emergency Contact Information Primary Emergency Contact: Ruch,Holly Address: [address]          [place], [place] [address] [place] Phone: [phone] Relation: Niece Secondary Emergency Contact: Ruch,George Address: [address]          [place], [place] [address] [place] Home Phone: [phone] Mobile Phone: [phone] Relation: Nephew  Code Status:  DNR Goals of care: Advanced Directive information    02/01/2023    4:26 PM  Advanced Directives  Does Patient Have a Medical Advance Directive? Yes  Type of Estate agent of [place];Out of facility DNR (pink MOST or yellow form);Living will  Does patient want to make changes to medical advance directive? No - Patient declined  Copy of Healthcare Power of Attorney in Chart? No - copy requested  Would patient like information on creating a medical advance directive? No - Patient declined     Chief Complaint  Patient presents with   Medical Management of Chronic Issues    Patient is being seen for a routine visit     HPI:  Pt is a 89 y.o. female seen today for medical management of chronic diseases.   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  COPD Using Oxygen now only at night Goal 88% or Higher and when she AMbulates Follows with Pulmonary CLD on CT scan Work up was negative Thought to be due to Chronic Aspirations   Dysphagia ST recommends Regular Diet but take aspiration precautions   Also h/o HLD, Depression, Hyponatremia, GERD   Was noticed to have Mild Edema in her Left Leg No Pain or redness She has had  surgery in that leg Stays in her room mostly Had no complains No Behaviors Walks with her walkaer Independent in her ADLS Wt Readings from Last 3 Encounters:  02/01/23 128 lb (58.1 kg)  12/27/22 128 lb (58.1 kg)  10/25/22 114 lb 6.4 oz (51.9 kg)    Past Medical History:  Diagnosis Date   Anxiety    Bradycardia    asymptomatic   Breast CA (HCC) 1993 OR 1994   LEFT, SURGERY AND RADIATION DONE   COPD (chronic obstructive pulmonary disease) (HCC)    MILD, NO INHALERS USED   DEGENERATIVE JOINT DISEASE, KNEE    Family history of anesthesia complication    NEPHEW NAUSEA/VOMITING   GENU VALGUM    Hyperlipidemia    Hypertension    Numbness of leg 2011   just left shin   OTHER ACQUIRED DEFORMITY OF ANKLE AND FOOT OTHER    Pneumonia 2013   X 2   PVC's (premature ventricular contractions)    ROTATOR CUFF SYNDROME, LEFT    CANNOT LIFT LEFT ARM ALL THE WAY UP   UNEQUAL LEG LENGTH    UTI (urinary tract infection)    STARTED AUGMENTIN  ON 09-10-13   Past Surgical History:  Procedure Laterality Date   APPENDECTOMY  AGE 77   BREAST LUMPECTOMY Left 1991   radiation   BREAST SURGERY Left 1993 OR 1994   LUMPECTOMY AND RADIATION DONE   JOINT  REPLACEMENT     NASAL SINUS SURGERY  2006   REVERSE SHOULDER ARTHROPLASTY Left 07/31/2015   Procedure: LEFT REVERSE SHOULDER ARTHROPLASTY;  Surgeon: Beverely Low, MD;  Location: [facility] OR;  Service: Orthopedics;  Laterality: Left;   TOTAL HIP ARTHROPLASTY Bilateral LEFT 2008 AND RIGHT 2010   TOTAL KNEE ARTHROPLASTY Left 09/17/2013   Procedure: LEFT TOTAL KNEE ARTHROPLASTY;  Surgeon: Shelda Pal, MD;  Location: [facility] ORS;  Service: Orthopedics;  Laterality: Left;    Allergies  Allergen Reactions   Albuterol Other (See Comments)    "Had some rapid heartbeat in 2013 when she took it for some bronchitis"   Diltiazem Other (See Comments)    "Pre-syncope, bp went a bit low for bp on 180 mg dose"   Naproxen Swelling   Other Nausea Only and Other (See  Comments)    AN UNNAMED PAIN MEDICATION CAUSED NAUSEA   Yellow Dye     #10   Cardizem [Diltiazem Hcl] Other (See Comments)    Hypotension   Nitrofurantoin Rash and Other (See Comments)    Sept., 2018      Outpatient Encounter Medications as of 02/01/2023  Medication Sig   acetaminophen (TYLENOL) 325 MG tablet Take 650 mg by mouth every 4 (four) hours as needed.   atorvastatin (LIPITOR) 20 MG tablet Take 20 mg by mouth daily.   Cyanocobalamin (VITAMIN B 12) 500 MCG TABS Take 1,000 mg by mouth daily.   Cyanocobalamin 1000 MCG/ML KIT Inject 1,000 mcg into the skin See admin instructions. Inject 1,000 mcg into the skin every 8 weeks   escitalopram (LEXAPRO) 20 MG tablet Take 20 mg by mouth daily.   GALANTAMINE HYDROBROMIDE PO Take 16 mg by mouth daily.   memantine (NAMENDA XR) 28 MG CP24 24 hr capsule Take 1 capsule (28 mg total) by mouth daily.   omeprazole (PRILOSEC) 20 MG capsule Take 20 mg by mouth daily.   polyethylene glycol (MIRALAX / GLYCOLAX) 17 g packet Take 17 g by mouth daily as needed for mild constipation.   Probiotic Product (ALIGN) CHEW Chew 2 tablets by mouth daily.   No facility-administered encounter medications on file as of 02/01/2023.    Review of Systems  Constitutional:  Negative for activity change and appetite change.  HENT: Negative.    Respiratory:  Negative for cough and shortness of breath.   Cardiovascular:  Negative for leg swelling.  Gastrointestinal:  Negative for constipation.  Genitourinary: Negative.   Musculoskeletal:  Positive for gait problem. Negative for arthralgias and myalgias.  Skin: Negative.   Neurological:  Negative for dizziness and weakness.  Psychiatric/Behavioral:  Positive for confusion. Negative for dysphoric mood and sleep disturbance.     Immunization History  Administered Date(s) Administered   DTaP, 5 pertussis antigens 05/11/2015   Influenza Split 04/27/2009, 08/28/2009, 09/29/2010, 11/08/2011, 10/26/2012, 11/08/2013    Influenza, High Dose Seasonal PF 11/13/2021, 11/15/2022   Influenza, Mdck, Trivalent,PF 6+ MOS(egg free) 10/03/2015   Influenza, Quadrivalent, Recombinant, Inj, Pf 10/05/2018, 10/07/2020   Influenza,inj,Quad PF,6+ Mos 10/29/2012, 11/08/2013, 11/22/2014   Influenza-Unspecified 10/03/2015, 10/18/2016   Moderna Covid-19 Fall Seasonal Vaccine 91yrs & older 11/29/2022   Moderna Covid-19 Seasonal Vaccine 6 months thru 88years of age 51/23/2021, 02/27/2019, 10/01/2019, 05/13/2020, 10/13/2020, 11/30/2020   Moderna Sars-Covid-2 Vaccination 02/27/2019   PNEUMOCOCCAL CONJUGATE-20 06/14/2022   Pneumococcal Polysaccharide-23 08/19/2002, 01/18/2005, 04/27/2009, 08/28/2009   Pneumococcal-Unspecified 05/25/2012   Td (Adult),5 Lf Tetanus Toxid, Preservative Free 08/28/2009   Tdap 04/27/2009, 12/09/2010, 05/11/2015   Zoster Recombinant(Shingrix) 06/01/2017, 06/05/2017,  08/30/2017   Zoster, Live 08/08/2003, 04/27/2009, 05/28/2016   Zoster, Unspecified 05/28/2016, 06/05/2017, 08/30/2017   Pertinent  Health Maintenance Due  Topic Date Due   INFLUENZA VACCINE  Completed   DEXA SCAN  Completed      06/23/2022   12:59 PM 07/20/2022    4:21 PM 08/22/2022   11:03 AM 10/17/2022   10:16 AM 02/01/2023    4:26 PM  Fall Risk  Falls in the past year? 0 0 1 1 0  Was there an injury with Fall? 0 0 1 0 0  Fall Risk Category Calculator 0 0 2 1 0  Patient at Risk for Falls Due to History of fall(s);Impaired balance/gait;Impaired mobility  History of fall(s);Impaired balance/gait History of fall(s);Impaired balance/gait No Fall Risks  Fall risk Follow up Falls evaluation completed  Falls evaluation completed;Education provided Falls evaluation completed;Education provided;Falls prevention discussed Falls evaluation completed   Functional Status Survey:    Vitals:   02/01/23 1623  BP: 116/68  Pulse: 79  Resp: 19  Temp: 98.1 F (36.7 C)  TempSrc: Temporal  SpO2: 94%  Weight: 128 lb (58.1 kg)  Height: 5\' 6"  (1.676  m)   Body mass index is 20.66 kg/m. Physical Exam Vitals reviewed.  Constitutional:      Appearance: Normal appearance.  HENT:     Head: Normocephalic.     Nose: Nose normal.     Mouth/Throat:     Mouth: Mucous membranes are moist.     Pharynx: Oropharynx is clear.  Eyes:     Pupils: Pupils are equal, round, and reactive to light.  Cardiovascular:     Rate and Rhythm: Normal rate and regular rhythm.     Pulses: Normal pulses.     Heart sounds: Normal heart sounds. No murmur heard. Pulmonary:     Effort: Pulmonary effort is normal.     Breath sounds: Normal breath sounds.  Abdominal:     General: Abdomen is flat. Bowel sounds are normal.     Palpations: Abdomen is soft.  Musculoskeletal:     Cervical back: Neck supple.     Comments: Mild Edema in Left Leg  Skin:    General: Skin is warm.  Neurological:     General: No focal deficit present.     Mental Status: She is alert and oriented to person, place, and time.  Psychiatric:        Mood and Affect: Mood normal.        Thought Content: Thought content normal.     Labs reviewed: Recent Labs    05/10/22 0452 05/13/22 0414 05/15/22 0414 05/19/22 0000 09/12/22 0000  NA 132* 134* 132* 137 140  K 3.6 3.8 4.3 4.1 4.2  CL 99 100 98 101 104  CO2 24 27 23  29* 28*  GLUCOSE 93 96 101*  --   --   BUN 15 11 12 11 16   CREATININE 0.78 0.74 0.78 0.8 0.9  CALCIUM 8.2* 8.3* 8.7*  --  9.3  PHOS  --   --  2.8  --   --    Recent Labs    05/09/22 1205 05/15/22 0414 09/12/22 0000  AST 26  --  17  ALT 18  --  9  ALKPHOS 84  --  117  BILITOT 0.9  --   --   PROT 7.3  --   --   ALBUMIN 2.7* 2.6* 3.6   Recent Labs    05/10/22 0452 05/13/22 0414 05/15/22 0414 05/19/22 0000  09/12/22 0000  WBC 7.9 6.3 8.8 7.0 6.1  NEUTROABS  --   --  5.3 4,515.00 4,166.00  HGB 10.8* 10.8* 11.2* 10.1* 12.8  HCT 32.9* 33.5* 34.4* 31* 39  MCV 99.1 98.8 97.5  --   --   PLT 201 198 263 228 191   Lab Results  Component Value Date   TSH  2.980 03/17/2021   No results found for: "HGBA1C" Lab Results  Component Value Date   CHOL 149 09/12/2022   HDL 56 09/12/2022   LDLCALC 75 09/12/2022   TRIG 98 09/12/2022    Significant Diagnostic Results in last 30 days:  No results found.  Assessment/Plan 1. Essential hypertension (Primary) No Meds BP stable  2. Chronic obstructive pulmonary disease, unspecified COPD type (HCC) Use Oxygen PRN  3. Gastroesophageal reflux disease, unspecified whether esophagitis present On Prilosec  4. Moderate late onset Alzheimer's dementia without behavioral disturbance, psychotic disturbance, mood disturbance, or anxiety (HCC) On Namenda and Galantamine Doing well in AL  5. Recurrent depression (HCC) Stable on Lexapro  6. Leg edema, left Elevate and try compression Stocking if worse 7 B12 def On both PO and IM dosing per previous PCP Last B 12 level was 599 8 HLD On Statin LDL 75 in 8/24 Family/ staff Communication:   Labs/tests ordered:

## 2023-03-01 ENCOUNTER — Non-Acute Institutional Stay: Payer: Self-pay | Admitting: Orthopedic Surgery

## 2023-03-01 ENCOUNTER — Encounter: Payer: Self-pay | Admitting: Orthopedic Surgery

## 2023-03-01 DIAGNOSIS — R600 Localized edema: Secondary | ICD-10-CM | POA: Diagnosis not present

## 2023-03-01 MED ORDER — FUROSEMIDE 20 MG PO TABS
20.0000 mg | ORAL_TABLET | Freq: Every day | ORAL | Status: DC
Start: 2023-03-01 — End: 2023-03-15

## 2023-03-01 MED ORDER — POTASSIUM CHLORIDE CRYS ER 10 MEQ PO TBCR
10.0000 meq | EXTENDED_RELEASE_TABLET | Freq: Every day | ORAL | Status: DC
Start: 2023-03-01 — End: 2023-03-15

## 2023-03-01 NOTE — Progress Notes (Signed)
Location:  [facility] Room Number: 9/A Place of Service:  ALF [phone]) Provider:  Octavia Heir, NP   Mahlon Gammon, MD  Patient Care Team: Mahlon Gammon, MD as PCP - General (Internal Medicine) Quintella Reichert, MD as PCP - Cardiology (Cardiology)  Extended Emergency Contact Information Primary Emergency Contact: Ruch,Holly Address: [address]          [place], [place] [address] [place] of [place] Phone: [phone] Relation: Niece Secondary Emergency Contact: Ruch,George Address: [address]          [place], [place] [address] [place] Home Phone: [phone] Mobile Phone: [phone] Relation: Nephew  Code Status:  DNR Goals of care: Advanced Directive information    02/01/2023    4:26 PM  Advanced Directives  Does Patient Have a Medical Advance Directive? Yes  Type of Estate agent of [place];Out of facility DNR (pink MOST or yellow form);Living will  Does patient want to make changes to medical advance directive? No - Patient declined  Copy of Healthcare Power of Attorney in Chart? No - copy requested  Would patient like information on creating a medical advance directive? No - Patient declined     Chief Complaint  Patient presents with   Acute Visit    Lower leg edema     HPI:  Pt is a 89 y.o. female seen today for acute visit due to lower leg edema.   She currently resides on the assisted living unit at [facility]. PMH: HTN, COPD, GERD, constipation, dementia, OA, breast cancer, anemia and depression.   Poor historian due to dementia. Increased pitting edema to BLE x 2 days. No h/o CHF. She denies chest pain, shortness of breath or acute cough. Admits to ambulating less and staying in her room more. Afebrile. Vitals stable.    Past Medical History:  Diagnosis Date   Anxiety    Bradycardia    asymptomatic   Breast CA (HCC) 1993 OR 1994   LEFT, SURGERY AND RADIATION DONE   COPD  (chronic obstructive pulmonary disease) (HCC)    MILD, NO INHALERS USED   DEGENERATIVE JOINT DISEASE, KNEE    Family history of anesthesia complication    NEPHEW NAUSEA/VOMITING   GENU VALGUM    Hyperlipidemia    Hypertension    Numbness of leg 2011   just left shin   OTHER ACQUIRED DEFORMITY OF ANKLE AND FOOT OTHER    Pneumonia 2013   X 2   PVC's (premature ventricular contractions)    ROTATOR CUFF SYNDROME, LEFT    CANNOT LIFT LEFT ARM ALL THE WAY UP   UNEQUAL LEG LENGTH    UTI (urinary tract infection)    STARTED AUGMENTIN  ON 09-10-13   Past Surgical History:  Procedure Laterality Date   APPENDECTOMY  AGE 44   BREAST LUMPECTOMY Left 1991   radiation   BREAST SURGERY Left 1993 OR 1994   LUMPECTOMY AND RADIATION DONE   JOINT REPLACEMENT     NASAL SINUS SURGERY  2006   REVERSE SHOULDER ARTHROPLASTY Left 07/31/2015   Procedure: LEFT REVERSE SHOULDER ARTHROPLASTY;  Surgeon: Beverely Low, MD;  Location: [facility] OR;  Service: Orthopedics;  Laterality: Left;   TOTAL HIP ARTHROPLASTY Bilateral LEFT 2008 AND RIGHT 2010   TOTAL KNEE ARTHROPLASTY Left 09/17/2013   Procedure: LEFT TOTAL KNEE ARTHROPLASTY;  Surgeon: Shelda Pal, MD;  Location: [facility] ORS;  Service: Orthopedics;  Laterality: Left;  Allergies  Allergen Reactions   Albuterol Other (See Comments)    "Had some rapid heartbeat in 2013 when she took it for some bronchitis"   Diltiazem Other (See Comments)    "Pre-syncope, bp went a bit low for bp on 180 mg dose"   Naproxen Swelling   Other Nausea Only and Other (See Comments)    AN UNNAMED PAIN MEDICATION CAUSED NAUSEA   Yellow Dye     #10   Cardizem [Diltiazem Hcl] Other (See Comments)    Hypotension   Nitrofurantoin Rash and Other (See Comments)    Sept., 2018      Outpatient Encounter Medications as of 03/01/2023  Medication Sig   acetaminophen (TYLENOL) 325 MG tablet Take 650 mg by mouth every 4 (four) hours as needed.   atorvastatin (LIPITOR) 20 MG tablet Take 20  mg by mouth daily.   Cyanocobalamin (VITAMIN B 12) 500 MCG TABS Take 1,000 mg by mouth daily.   Cyanocobalamin 1000 MCG/ML KIT Inject 1,000 mcg into the skin See admin instructions. Inject 1,000 mcg into the skin every 8 weeks   escitalopram (LEXAPRO) 20 MG tablet Take 20 mg by mouth daily.   GALANTAMINE HYDROBROMIDE PO Take 16 mg by mouth daily.   memantine (NAMENDA XR) 28 MG CP24 24 hr capsule Take 1 capsule (28 mg total) by mouth daily.   omeprazole (PRILOSEC) 20 MG capsule Take 20 mg by mouth daily.   polyethylene glycol (MIRALAX / GLYCOLAX) 17 g packet Take 17 g by mouth daily as needed for mild constipation.   Probiotic Product (ALIGN) CHEW Chew 2 tablets by mouth daily.   No facility-administered encounter medications on file as of 03/01/2023.    Review of Systems  Unable to perform ROS: Dementia    Immunization History  Administered Date(s) Administered   DTaP, 5 pertussis antigens 05/11/2015   Influenza Split 04/27/2009, 08/28/2009, 09/29/2010, 11/08/2011, 10/26/2012, 11/08/2013   Influenza, High Dose Seasonal PF 11/13/2021, 11/15/2022   Influenza, Mdck, Trivalent,PF 6+ MOS(egg free) 10/03/2015   Influenza, Quadrivalent, Recombinant, Inj, Pf 10/05/2018, 10/07/2020   Influenza,inj,Quad PF,6+ Mos 10/29/2012, 11/08/2013, 11/22/2014   Influenza-Unspecified 10/03/2015, 10/18/2016   Moderna Covid-19 Fall Seasonal Vaccine 3yrs & older 11/29/2022   Moderna Covid-19 Seasonal Vaccine 6 months thru 88years of age 78/23/2021, 02/27/2019, 10/01/2019, 05/13/2020, 10/13/2020, 11/30/2020   Moderna Sars-Covid-2 Vaccination 02/27/2019   PNEUMOCOCCAL CONJUGATE-20 06/14/2022   Pneumococcal Polysaccharide-23 08/19/2002, 01/18/2005, 04/27/2009, 08/28/2009   Pneumococcal-Unspecified 05/25/2012   Td (Adult),5 Lf Tetanus Toxid, Preservative Free 08/28/2009   Tdap 04/27/2009, 12/09/2010, 05/11/2015   Zoster Recombinant(Shingrix) 06/01/2017, 06/05/2017, 08/30/2017   Zoster, Live 08/08/2003,  04/27/2009, 05/28/2016   Zoster, Unspecified 05/28/2016, 06/05/2017, 08/30/2017   Pertinent  Health Maintenance Due  Topic Date Due   INFLUENZA VACCINE  Completed   DEXA SCAN  Completed      06/23/2022   12:59 PM 07/20/2022    4:21 PM 08/22/2022   11:03 AM 10/17/2022   10:16 AM 02/01/2023    4:26 PM  Fall Risk  Falls in the past year? 0 0 1 1 0  Was there an injury with Fall? 0 0 1 0 0  Fall Risk Category Calculator 0 0 2 1 0  Patient at Risk for Falls Due to History of fall(s);Impaired balance/gait;Impaired mobility  History of fall(s);Impaired balance/gait History of fall(s);Impaired balance/gait No Fall Risks  Fall risk Follow up Falls evaluation completed  Falls evaluation completed;Education provided Falls evaluation completed;Education provided;Falls prevention discussed Falls evaluation completed   Functional Status Survey:  Vitals:   03/01/23 1409  BP: 129/77  Pulse: 80  Resp: 16  Temp: 97.7 F (36.5 C)  SpO2: 94%  Weight: 130 lb 9.6 oz (59.2 kg)  Height: 5\' 6"  (1.676 m)   Body mass index is 21.08 kg/m. Physical Exam Vitals reviewed.  Constitutional:      General: She is not in acute distress. HENT:     Head: Normocephalic.  Eyes:     General:        Right eye: No discharge.        Left eye: No discharge.  Cardiovascular:     Rate and Rhythm: Normal rate and regular rhythm.     Pulses: Normal pulses.     Heart sounds: Normal heart sounds.  Pulmonary:     Effort: Pulmonary effort is normal.     Breath sounds: Normal breath sounds.  Abdominal:     General: Bowel sounds are normal.     Palpations: Abdomen is soft.  Musculoskeletal:     Cervical back: Neck supple.     Right lower leg: Edema present.     Left lower leg: Edema present.     Comments: 1+ pitting edema right leg, 2+ pitting edema right leg  Skin:    General: Skin is warm.     Capillary Refill: Capillary refill takes less than 2 seconds.  Neurological:     General: No focal deficit present.      Mental Status: She is alert and oriented to person, place, and time.  Psychiatric:        Mood and Affect: Mood normal.     Labs reviewed: Recent Labs    05/10/22 0452 05/13/22 0414 05/15/22 0414 05/19/22 0000 09/12/22 0000  NA 132* 134* 132* 137 140  K 3.6 3.8 4.3 4.1 4.2  CL 99 100 98 101 104  CO2 24 27 23  29* 28*  GLUCOSE 93 96 101*  --   --   BUN 15 11 12 11 16   CREATININE 0.78 0.74 0.78 0.8 0.9  CALCIUM 8.2* 8.3* 8.7*  --  9.3  PHOS  --   --  2.8  --   --    Recent Labs    05/09/22 1205 05/15/22 0414 09/12/22 0000  AST 26  --  17  ALT 18  --  9  ALKPHOS 84  --  117  BILITOT 0.9  --   --   PROT 7.3  --   --   ALBUMIN 2.7* 2.6* 3.6   Recent Labs    05/10/22 0452 05/13/22 0414 05/15/22 0414 05/19/22 0000 09/12/22 0000  WBC 7.9 6.3 8.8 7.0 6.1  NEUTROABS  --   --  5.3 4,515.00 4,166.00  HGB 10.8* 10.8* 11.2* 10.1* 12.8  HCT 32.9* 33.5* 34.4* 31* 39  MCV 99.1 98.8 97.5  --   --   PLT 201 198 263 228 191   Lab Results  Component Value Date   TSH 2.980 03/17/2021   No results found for: "HGBA1C" Lab Results  Component Value Date   CHOL 149 09/12/2022   HDL 56 09/12/2022   LDLCALC 75 09/12/2022   TRIG 98 09/12/2022    Significant Diagnostic Results in last 30 days:  No results found.  Assessment/Plan 1. Bilateral leg edema (Primary) - pitting edema x 2 days, R>L - no h/o CHF - lung sounds clear - suspect from sedentary lifestyle - start furosemide/KCL x 2 days - 02/14 compression stockings daily  - furosemide (LASIX) 20  MG tablet; Take 1 tablet (20 mg total) by mouth daily for 2 days. - potassium chloride (KLOR-CON M) 10 MEQ tablet; Take 1 tablet (10 mEq total) by mouth daily for 2 days.    Family/ staff Communication: plan discussed with patient and nurse  Labs/tests ordered:  none

## 2023-03-15 ENCOUNTER — Non-Acute Institutional Stay: Payer: Self-pay | Admitting: Orthopedic Surgery

## 2023-03-15 ENCOUNTER — Encounter: Payer: Self-pay | Admitting: Orthopedic Surgery

## 2023-03-15 DIAGNOSIS — R600 Localized edema: Secondary | ICD-10-CM

## 2023-03-15 MED ORDER — POTASSIUM CHLORIDE CRYS ER 10 MEQ PO TBCR
10.0000 meq | EXTENDED_RELEASE_TABLET | Freq: Every day | ORAL | Status: DC
Start: 2023-03-15 — End: 2023-04-23

## 2023-03-15 MED ORDER — FUROSEMIDE 20 MG PO TABS
20.0000 mg | ORAL_TABLET | Freq: Every day | ORAL | Status: DC
Start: 2023-03-15 — End: 2023-04-23

## 2023-03-15 NOTE — Progress Notes (Signed)
 Location:  [facility] Room Number: 9/A Place of Service:  ALF [phone]) Provider:  Octavia Heir, NP   Mahlon Gammon, MD  Patient Care Team: Mahlon Gammon, MD as PCP - General (Internal Medicine) Quintella Reichert, MD as PCP - Cardiology (Cardiology)  Extended Emergency Contact Information Primary Emergency Contact: Ruch,Holly Address: [address]          [place], [place] [address] [place] Phone: [phone] Relation: Niece Secondary Emergency Contact: Ruch,George Address: [address]          [place], [place] [address] [place] Home Phone: [phone] Mobile Phone: [phone] Relation: Nephew  Code Status:  DNR Goals of care: Advanced Directive information    02/01/2023    4:26 PM  Advanced Directives  Does Patient Have a Medical Advance Directive? Yes  Type of Estate agent of [place];Out of facility DNR (pink MOST or yellow form);Living will  Does patient want to make changes to medical advance directive? No - Patient declined  Copy of Healthcare Power of Attorney in Chart? No - copy requested  Would patient like information on creating a medical advance directive? No - Patient declined     Chief Complaint  Patient presents with   Acute Visit    Lower leg edema    HPI:  Pt is a 89 y.o. female seen today for acute visit due to bilateral leg edema.   She currently resides on the assisted living unit at [facility]. PMH: HTN, COPD, GERD, constipation, dementia, OA, breast cancer, anemia and depression.   02/12 she had bilateral pitting edema and was prescribed furosemide x 2 days. 02/14 she was advised to start compression stockings but refused. Today, pitting edema has returned. L>R. Her shoes are tight when she wears them. No h/o CHF. She is mainly sedentary in her room, but able to ambulate short distances. She remains on a regular diet. Poor historian due to dementia. She denies chest  pain, sob, and lower leg pain. No recent injury or fall. Afebrile. Vitals stable.      Past Medical History:  Diagnosis Date   Anxiety    Bradycardia    asymptomatic   Breast CA (HCC) 1993 OR 1994   LEFT, SURGERY AND RADIATION DONE   COPD (chronic obstructive pulmonary disease) (HCC)    MILD, NO INHALERS USED   DEGENERATIVE JOINT DISEASE, KNEE    Family history of anesthesia complication    NEPHEW NAUSEA/VOMITING   GENU VALGUM    Hyperlipidemia    Hypertension    Numbness of leg 2011   just left shin   OTHER ACQUIRED DEFORMITY OF ANKLE AND FOOT OTHER    Pneumonia 2013   X 2   PVC's (premature ventricular contractions)    ROTATOR CUFF SYNDROME, LEFT    CANNOT LIFT LEFT ARM ALL THE WAY UP   UNEQUAL LEG LENGTH    UTI (urinary tract infection)    STARTED AUGMENTIN  ON 09-10-13   Past Surgical History:  Procedure Laterality Date   APPENDECTOMY  AGE 27   BREAST LUMPECTOMY Left 1991   radiation   BREAST SURGERY Left 1993 OR 1994   LUMPECTOMY AND RADIATION DONE   JOINT REPLACEMENT     NASAL SINUS SURGERY  2006   REVERSE SHOULDER ARTHROPLASTY Left 07/31/2015   Procedure: LEFT REVERSE SHOULDER ARTHROPLASTY;  Surgeon: Beverely Low, MD;  Location: [facility] OR;  Service: Orthopedics;  Laterality: Left;  TOTAL HIP ARTHROPLASTY Bilateral LEFT 2008 AND RIGHT 2010   TOTAL KNEE ARTHROPLASTY Left 09/17/2013   Procedure: LEFT TOTAL KNEE ARTHROPLASTY;  Surgeon: Shelda Pal, MD;  Location: [facility] ORS;  Service: Orthopedics;  Laterality: Left;    Allergies  Allergen Reactions   Albuterol Other (See Comments)    "Had some rapid heartbeat in 2013 when she took it for some bronchitis"   Diltiazem Other (See Comments)    "Pre-syncope, bp went a bit low for bp on 180 mg dose"   Naproxen Swelling   Other Nausea Only and Other (See Comments)    AN UNNAMED PAIN MEDICATION CAUSED NAUSEA   Yellow Dye     #10   Cardizem [Diltiazem Hcl] Other (See Comments)    Hypotension   Nitrofurantoin Rash and  Other (See Comments)    Sept., 2018      Outpatient Encounter Medications as of 03/15/2023  Medication Sig   acetaminophen (TYLENOL) 325 MG tablet Take 650 mg by mouth every 4 (four) hours as needed.   atorvastatin (LIPITOR) 20 MG tablet Take 20 mg by mouth daily.   Cyanocobalamin (VITAMIN B 12) 500 MCG TABS Take 1,000 mg by mouth daily.   Cyanocobalamin 1000 MCG/ML KIT Inject 1,000 mcg into the skin See admin instructions. Inject 1,000 mcg into the skin every 8 weeks   escitalopram (LEXAPRO) 20 MG tablet Take 20 mg by mouth daily.   furosemide (LASIX) 20 MG tablet Take 1 tablet (20 mg total) by mouth daily for 2 days.   GALANTAMINE HYDROBROMIDE PO Take 16 mg by mouth daily.   memantine (NAMENDA XR) 28 MG CP24 24 hr capsule Take 1 capsule (28 mg total) by mouth daily.   omeprazole (PRILOSEC) 20 MG capsule Take 20 mg by mouth daily.   polyethylene glycol (MIRALAX / GLYCOLAX) 17 g packet Take 17 g by mouth daily as needed for mild constipation.   potassium chloride (KLOR-CON M) 10 MEQ tablet Take 1 tablet (10 mEq total) by mouth daily for 2 days.   Probiotic Product (ALIGN) CHEW Chew 2 tablets by mouth daily.   No facility-administered encounter medications on file as of 03/15/2023.    Review of Systems  Unable to perform ROS: Dementia    Immunization History  Administered Date(s) Administered   DTaP, 5 pertussis antigens 05/11/2015   Influenza Split 04/27/2009, 08/28/2009, 09/29/2010, 11/08/2011, 10/26/2012, 11/08/2013   Influenza, High Dose Seasonal PF 11/13/2021, 11/15/2022   Influenza, Mdck, Trivalent,PF 6+ MOS(egg free) 10/03/2015   Influenza, Quadrivalent, Recombinant, Inj, Pf 10/05/2018, 10/07/2020   Influenza,inj,Quad PF,6+ Mos 10/29/2012, 11/08/2013, 11/22/2014   Influenza-Unspecified 10/03/2015, 10/18/2016   Moderna Covid-19 Fall Seasonal Vaccine 64yrs & older 11/29/2022   Moderna Covid-19 Seasonal Vaccine 6 months thru 88years of age 38/23/2021, 02/27/2019, 10/01/2019,  05/13/2020, 10/13/2020, 11/30/2020   Moderna Sars-Covid-2 Vaccination 02/27/2019   PNEUMOCOCCAL CONJUGATE-20 06/14/2022   Pneumococcal Polysaccharide-23 08/19/2002, 01/18/2005, 04/27/2009, 08/28/2009   Pneumococcal-Unspecified 05/25/2012   Td (Adult),5 Lf Tetanus Toxid, Preservative Free 08/28/2009   Tdap 04/27/2009, 12/09/2010, 05/11/2015   Zoster Recombinant(Shingrix) 06/01/2017, 06/05/2017, 08/30/2017   Zoster, Live 08/08/2003, 04/27/2009, 05/28/2016   Zoster, Unspecified 05/28/2016, 06/05/2017, 08/30/2017   Pertinent  Health Maintenance Due  Topic Date Due   INFLUENZA VACCINE  Completed   DEXA SCAN  Completed      06/23/2022   12:59 PM 07/20/2022    4:21 PM 08/22/2022   11:03 AM 10/17/2022   10:16 AM 02/01/2023    4:26 PM  Fall Risk  Falls in  the past year? 0 0 1 1 0  Was there an injury with Fall? 0 0 1 0 0  Fall Risk Category Calculator 0 0 2 1 0  Patient at Risk for Falls Due to History of fall(s);Impaired balance/gait;Impaired mobility  History of fall(s);Impaired balance/gait History of fall(s);Impaired balance/gait No Fall Risks  Fall risk Follow up Falls evaluation completed  Falls evaluation completed;Education provided Falls evaluation completed;Education provided;Falls prevention discussed Falls evaluation completed   Functional Status Survey:    Vitals:   03/15/23 1510  BP: 132/78  Pulse: 63  Resp: 20  Temp: (!) 97.1 F (36.2 C)  SpO2: 94%  Weight: 130 lb 6.4 oz (59.1 kg)  Height: 5\' 6"  (1.676 m)   Body mass index is 21.05 kg/m. Physical Exam Vitals reviewed.  Constitutional:      General: She is not in acute distress. HENT:     Head: Normocephalic.  Eyes:     General:        Right eye: No discharge.        Left eye: No discharge.  Cardiovascular:     Rate and Rhythm: Normal rate and regular rhythm.     Pulses: Normal pulses.     Heart sounds: Normal heart sounds.  Pulmonary:     Effort: Pulmonary effort is normal.     Breath sounds: Normal breath  sounds. No rales.  Abdominal:     Palpations: Abdomen is soft.  Musculoskeletal:     Cervical back: Neck supple.     Right lower leg: Edema present.     Left lower leg: Edema present.     Comments: 1+ pitting, L>R, BLE cool to touch  Skin:    General: Skin is warm.     Capillary Refill: Capillary refill takes less than 2 seconds.  Neurological:     General: No focal deficit present.     Mental Status: She is alert.  Psychiatric:        Mood and Affect: Mood normal.     Labs reviewed: Recent Labs    05/10/22 0452 05/13/22 0414 05/15/22 0414 05/19/22 0000 09/12/22 0000  NA 132* 134* 132* 137 140  K 3.6 3.8 4.3 4.1 4.2  CL 99 100 98 101 104  CO2 24 27 23  29* 28*  GLUCOSE 93 96 101*  --   --   BUN 15 11 12 11 16   CREATININE 0.78 0.74 0.78 0.8 0.9  CALCIUM 8.2* 8.3* 8.7*  --  9.3  PHOS  --   --  2.8  --   --    Recent Labs    05/09/22 1205 05/15/22 0414 09/12/22 0000  AST 26  --  17  ALT 18  --  9  ALKPHOS 84  --  117  BILITOT 0.9  --   --   PROT 7.3  --   --   ALBUMIN 2.7* 2.6* 3.6   Recent Labs    05/10/22 0452 05/13/22 0414 05/15/22 0414 05/19/22 0000 09/12/22 0000  WBC 7.9 6.3 8.8 7.0 6.1  NEUTROABS  --   --  5.3 4,515.00 4,166.00  HGB 10.8* 10.8* 11.2* 10.1* 12.8  HCT 32.9* 33.5* 34.4* 31* 39  MCV 99.1 98.8 97.5  --   --   PLT 201 198 263 228 191   Lab Results  Component Value Date   TSH 2.980 03/17/2021   No results found for: "HGBA1C" Lab Results  Component Value Date   CHOL 149 09/12/2022   HDL  56 09/12/2022   LDLCALC 75 09/12/2022   TRIG 98 09/12/2022    Significant Diagnostic Results in last 30 days:  No results found.  Assessment/Plan 1. Bilateral leg edema (Primary) - 02/12 BLE pitting edema> resolved with furosemide 20 mg x 2 days - refused to wear compression stockings after diuresis - +1 pitting edema today, L>R - do not suspect DVT> denies pain, extremities cool  - will repeat furosemide 20 mg po QAM x 2 days - start KCL  10 meq po QAM x 2 days - if future episodes recommend furosemide 20 mg 2x/weekly for prevention     Family/ staff Communication: plan discussed with patient and nurse  Labs/tests ordered:  none

## 2023-04-11 ENCOUNTER — Encounter: Payer: Self-pay | Admitting: Orthopedic Surgery

## 2023-04-11 ENCOUNTER — Non-Acute Institutional Stay: Payer: Self-pay | Admitting: Orthopedic Surgery

## 2023-04-11 DIAGNOSIS — R600 Localized edema: Secondary | ICD-10-CM | POA: Diagnosis not present

## 2023-04-11 NOTE — Progress Notes (Signed)
 Location:  [facility] Room Number: 9/A Place of Service:  ALF [phone]) Provider:  Hazle Nordmann, NP   Patient Care Team: Mahlon Gammon, MD as PCP - General (Internal Medicine) Quintella Reichert, MD as PCP - Cardiology (Cardiology)  Extended Emergency Contact Information Primary Emergency Contact: Ruch,Holly Address: [address]          [place], [place] [address] [place] Phone: [phone] Relation: Niece Secondary Emergency Contact: Ruch,George Address: [address]          [place], [place] [address] [place] Home Phone: [phone] Mobile Phone: [phone] Relation: Nephew  Code Status:  DNR Goals of care: Advanced Directive information    02/01/2023    4:26 PM  Advanced Directives  Does Patient Have a Medical Advance Directive? Yes  Type of Estate agent of [place];Out of facility DNR (pink MOST or yellow form);Living will  Does patient want to make changes to medical advance directive? No - Patient declined  Copy of Healthcare Power of Attorney in Chart? No - copy requested  Would patient like information on creating a medical advance directive? No - Patient declined     Chief Complaint  Patient presents with   Leg Swelling    Lower leg swelling    HPI:  Pt is a 89 y.o. female seen today for an acute visit due to ongoing leg edema.   She currently resides on the assisted living unit at [facility]. PMH: HTN, COPD, GERD, constipation, dementia, OA, breast cancer, anemia and depression.   "02/12 she had bilateral pitting edema and was prescribed furosemide x 2 days. 02/14 she was advised to start compression stockings but refused. Today, pitting edema has returned. L>R. Her shoes are tight when she wears them. No h/o CHF. She is mainly sedentary in her room, but able to ambulate short distances. She remains on a regular diet. Poor historian due to dementia. She denies chest pain, sob, and  lower leg pain. No recent injury or fall. 02/26 she was prescribed furosemide 20 mg x 2 days ans symptoms resolved."  Today, lower leg edema has returned, L>R.  Admits to not ambulating long distances. She mainly stays in room. Treatment options discussed. She is willing to try compression stockings for a few days. Denies chest pain, sob and lower leg pain.    Past Medical History:  Diagnosis Date   Anxiety    Bradycardia    asymptomatic   Breast CA (HCC) 1993 OR 1994   LEFT, SURGERY AND RADIATION DONE   COPD (chronic obstructive pulmonary disease) (HCC)    MILD, NO INHALERS USED   DEGENERATIVE JOINT DISEASE, KNEE    Family history of anesthesia complication    NEPHEW NAUSEA/VOMITING   GENU VALGUM    Hyperlipidemia    Hypertension    Numbness of leg 2011   just left shin   OTHER ACQUIRED DEFORMITY OF ANKLE AND FOOT OTHER    Pneumonia 2013   X 2   PVC's (premature ventricular contractions)    ROTATOR CUFF SYNDROME, LEFT    CANNOT LIFT LEFT ARM ALL THE WAY UP   UNEQUAL LEG LENGTH    UTI (urinary tract infection)    STARTED AUGMENTIN  ON 09-10-13   Past Surgical History:  Procedure Laterality Date   APPENDECTOMY  AGE 67   BREAST LUMPECTOMY Left 1991   radiation   BREAST SURGERY Left 1993 OR 1994   LUMPECTOMY AND  RADIATION DONE   JOINT REPLACEMENT     NASAL SINUS SURGERY  2006   REVERSE SHOULDER ARTHROPLASTY Left 07/31/2015   Procedure: LEFT REVERSE SHOULDER ARTHROPLASTY;  Surgeon: Beverely Low, MD;  Location: [facility] OR;  Service: Orthopedics;  Laterality: Left;   TOTAL HIP ARTHROPLASTY Bilateral LEFT 2008 AND RIGHT 2010   TOTAL KNEE ARTHROPLASTY Left 09/17/2013   Procedure: LEFT TOTAL KNEE ARTHROPLASTY;  Surgeon: Shelda Pal, MD;  Location: [facility] ORS;  Service: Orthopedics;  Laterality: Left;    Allergies  Allergen Reactions   Albuterol Other (See Comments)    "Had some rapid heartbeat in 2013 when she took it for some bronchitis"   Diltiazem Other (See Comments)     "Pre-syncope, bp went a bit low for bp on 180 mg dose"   Naproxen Swelling   Other Nausea Only and Other (See Comments)    AN UNNAMED PAIN MEDICATION CAUSED NAUSEA   Yellow Dye     #10   Cardizem [Diltiazem Hcl] Other (See Comments)    Hypotension   Nitrofurantoin Rash and Other (See Comments)    Sept., 2018      Outpatient Encounter Medications as of 04/11/2023  Medication Sig   acetaminophen (TYLENOL) 325 MG tablet Take 650 mg by mouth every 4 (four) hours as needed.   atorvastatin (LIPITOR) 20 MG tablet Take 20 mg by mouth daily.   Cyanocobalamin (VITAMIN B 12) 500 MCG TABS Take 1,000 mg by mouth daily.   Cyanocobalamin 1000 MCG/ML KIT Inject 1,000 mcg into the skin See admin instructions. Inject 1,000 mcg into the skin every 8 weeks   escitalopram (LEXAPRO) 20 MG tablet Take 20 mg by mouth daily.   GALANTAMINE HYDROBROMIDE PO Take 16 mg by mouth daily.   memantine (NAMENDA XR) 28 MG CP24 24 hr capsule Take 1 capsule (28 mg total) by mouth daily.   omeprazole (PRILOSEC) 20 MG capsule Take 20 mg by mouth daily.   polyethylene glycol (MIRALAX / GLYCOLAX) 17 g packet Take 17 g by mouth daily as needed for mild constipation.   Probiotic Product (ALIGN) CHEW Chew 2 tablets by mouth daily.   furosemide (LASIX) 20 MG tablet Take 1 tablet (20 mg total) by mouth daily for 2 days.   potassium chloride (KLOR-CON M) 10 MEQ tablet Take 1 tablet (10 mEq total) by mouth daily for 2 days.   No facility-administered encounter medications on file as of 04/11/2023.    Review of Systems  Unable to perform ROS: Dementia    Immunization History  Administered Date(s) Administered   DTaP, 5 pertussis antigens 05/11/2015   Influenza Split 04/27/2009, 08/28/2009, 09/29/2010, 11/08/2011, 10/26/2012, 11/08/2013   Influenza, High Dose Seasonal PF 11/13/2021, 11/15/2022   Influenza, Mdck, Trivalent,PF 6+ MOS(egg free) 10/03/2015   Influenza, Quadrivalent, Recombinant, Inj, Pf 10/05/2018, 10/07/2020    Influenza,inj,Quad PF,6+ Mos 10/29/2012, 11/08/2013, 11/22/2014   Influenza-Unspecified 10/03/2015, 10/18/2016   Moderna Covid-19 Fall Seasonal Vaccine 59yrs & older 11/29/2022   Moderna Covid-19 Seasonal Vaccine 6 months thru 88years of age 21/23/2021, 02/27/2019, 10/01/2019, 05/13/2020, 10/13/2020, 11/30/2020   Moderna Sars-Covid-2 Vaccination 02/27/2019   PNEUMOCOCCAL CONJUGATE-20 06/14/2022   Pneumococcal Polysaccharide-23 08/19/2002, 01/18/2005, 04/27/2009, 08/28/2009   Pneumococcal-Unspecified 05/25/2012   Td (Adult),5 Lf Tetanus Toxid, Preservative Free 08/28/2009   Tdap 04/27/2009, 12/09/2010, 05/11/2015   Zoster Recombinant(Shingrix) 06/01/2017, 06/05/2017, 08/30/2017   Zoster, Live 08/08/2003, 04/27/2009, 05/28/2016   Zoster, Unspecified 05/28/2016, 06/05/2017, 08/30/2017   Pertinent  Health Maintenance Due  Topic Date Due   INFLUENZA VACCINE  Completed   DEXA SCAN  Completed      06/23/2022   12:59 PM 07/20/2022    4:21 PM 08/22/2022   11:03 AM 10/17/2022   10:16 AM 02/01/2023    4:26 PM  Fall Risk  Falls in the past year? 0 0 1 1 0  Was there an injury with Fall? 0 0 1 0 0  Fall Risk Category Calculator 0 0 2 1 0  Patient at Risk for Falls Due to History of fall(s);Impaired balance/gait;Impaired mobility  History of fall(s);Impaired balance/gait History of fall(s);Impaired balance/gait No Fall Risks  Fall risk Follow up Falls evaluation completed  Falls evaluation completed;Education provided Falls evaluation completed;Education provided;Falls prevention discussed Falls evaluation completed   Functional Status Survey:    Vitals:   04/11/23 0946  BP: 109/71  Pulse: (!) 56  Resp: 18  Temp: 98.6 F (37 C)  SpO2: 94%  Weight: 128 lb 3.2 oz (58.2 kg)  Height: 5\' 6"  (1.676 m)   Body mass index is 20.69 kg/m. Physical Exam Vitals reviewed.  Constitutional:      General: She is not in acute distress. HENT:     Head: Normocephalic.  Eyes:     General:        Right  eye: No discharge.        Left eye: No discharge.  Cardiovascular:     Rate and Rhythm: Normal rate and regular rhythm.     Pulses: Normal pulses.     Heart sounds: Normal heart sounds.  Pulmonary:     Effort: Pulmonary effort is normal.     Breath sounds: Normal breath sounds.  Abdominal:     General: Bowel sounds are normal.     Palpations: Abdomen is soft.  Musculoskeletal:     Cervical back: Neck supple.     Right lower leg: Edema present.     Left lower leg: Edema present.     Comments: Non pitting, L>R  Skin:    General: Skin is warm.     Capillary Refill: Capillary refill takes less than 2 seconds.  Neurological:     General: No focal deficit present.     Mental Status: She is alert. Mental status is at baseline.     Motor: Weakness present.     Gait: Gait abnormal.  Psychiatric:        Mood and Affect: Mood normal.     Labs reviewed: Recent Labs    05/10/22 0452 05/13/22 0414 05/15/22 0414 05/19/22 0000 09/12/22 0000  NA 132* 134* 132* 137 140  K 3.6 3.8 4.3 4.1 4.2  CL 99 100 98 101 104  CO2 24 27 23  29* 28*  GLUCOSE 93 96 101*  --   --   BUN 15 11 12 11 16   CREATININE 0.78 0.74 0.78 0.8 0.9  CALCIUM 8.2* 8.3* 8.7*  --  9.3  PHOS  --   --  2.8  --   --    Recent Labs    05/09/22 1205 05/15/22 0414 09/12/22 0000  AST 26  --  17  ALT 18  --  9  ALKPHOS 84  --  117  BILITOT 0.9  --   --   PROT 7.3  --   --   ALBUMIN 2.7* 2.6* 3.6   Recent Labs    05/10/22 0452 05/13/22 0414 05/15/22 0414 05/19/22 0000 09/12/22 0000  WBC 7.9 6.3 8.8 7.0 6.1  NEUTROABS  --   --  5.3 4,515.00  4,166.00  HGB 10.8* 10.8* 11.2* 10.1* 12.8  HCT 32.9* 33.5* 34.4* 31* 39  MCV 99.1 98.8 97.5  --   --   PLT 201 198 263 228 191   Lab Results  Component Value Date   TSH 2.980 03/17/2021   No results found for: "HGBA1C" Lab Results  Component Value Date   CHOL 149 09/12/2022   HDL 56 09/12/2022   LDLCALC 75 09/12/2022   TRIG 98 09/12/2022    Significant  Diagnostic Results in last 30 days:  No results found.  Assessment/Plan 1. Bilateral leg edema (Primary) - episodes 02/12 & 02/26> resolved with furosemide 20 mg x 2 days - non pitting edema, lung sounds clear  - no recent lab work - she is willing to try compression stockings - compression stockings x 2 days and then prn for swelling - consider scheduled furosemide 1-2 times weekly if edema worsens    Family/ staff Communication: plan discussed with patient and nurse  Labs/tests ordered:  cbc/diff, cmp 03/27

## 2023-04-13 LAB — CBC AND DIFFERENTIAL
HCT: 33 — AB (ref 36–46)
Hemoglobin: 11 — AB (ref 12.0–16.0)
Platelets: 169 K/uL (ref 150–400)
WBC: 6.5

## 2023-04-13 LAB — HEPATIC FUNCTION PANEL
ALT: 5 U/L — AB (ref 7–35)
AST: 13 (ref 13–35)
Alkaline Phosphatase: 77 (ref 25–125)
Bilirubin, Total: 0.5

## 2023-04-13 LAB — BASIC METABOLIC PANEL WITH GFR
BUN: 15 (ref 4–21)
CO2: 28 — AB (ref 13–22)
Chloride: 107 (ref 99–108)
Creatinine: 0.8 (ref 0.5–1.1)
Glucose: 78
Potassium: 3.9 meq/L (ref 3.5–5.1)
Sodium: 140 (ref 137–147)

## 2023-04-13 LAB — COMPREHENSIVE METABOLIC PANEL WITH GFR
Albumin: 3.2 — AB (ref 3.5–5.0)
Calcium: 8.5 — AB (ref 8.7–10.7)
Globulin: 2.4
eGFR: 68

## 2023-04-13 LAB — CBC: RBC: 3.49 — AB (ref 3.87–5.11)

## 2023-04-21 ENCOUNTER — Non-Acute Institutional Stay: Payer: Self-pay | Admitting: Orthopedic Surgery

## 2023-04-21 ENCOUNTER — Encounter: Payer: Self-pay | Admitting: Orthopedic Surgery

## 2023-04-21 DIAGNOSIS — K219 Gastro-esophageal reflux disease without esophagitis: Secondary | ICD-10-CM | POA: Diagnosis not present

## 2023-04-21 DIAGNOSIS — F339 Major depressive disorder, recurrent, unspecified: Secondary | ICD-10-CM

## 2023-04-21 DIAGNOSIS — J449 Chronic obstructive pulmonary disease, unspecified: Secondary | ICD-10-CM

## 2023-04-21 DIAGNOSIS — I10 Essential (primary) hypertension: Secondary | ICD-10-CM | POA: Diagnosis not present

## 2023-04-21 DIAGNOSIS — G301 Alzheimer's disease with late onset: Secondary | ICD-10-CM

## 2023-04-21 DIAGNOSIS — F02B0 Dementia in other diseases classified elsewhere, moderate, without behavioral disturbance, psychotic disturbance, mood disturbance, and anxiety: Secondary | ICD-10-CM

## 2023-04-21 DIAGNOSIS — R600 Localized edema: Secondary | ICD-10-CM

## 2023-04-21 NOTE — Progress Notes (Signed)
 Location:  [facility] Room Number: 9/A Place of Service:  ALF [phone]) Provider:  Octavia Heir, NP   Mahlon Gammon, MD  Patient Care Team: Mahlon Gammon, MD as PCP - General (Internal Medicine) Quintella Reichert, MD as PCP - Cardiology (Cardiology)  Extended Emergency Contact Information Primary Emergency Contact: Ruch,Holly Address: [address]          [place], [place] [address] [place] Phone: [phone] Relation: Niece Secondary Emergency Contact: Ruch,George Address: [address]          [place], [place] [address] [place] Home Phone: [phone] Mobile Phone: [phone] Relation: Nephew  Code Status:  DNR Goals of care: Advanced Directive information    02/01/2023    4:26 PM  Advanced Directives  Does Patient Have a Medical Advance Directive? Yes  Type of Estate agent of [place];Out of facility DNR (pink MOST or yellow form);Living will  Does patient want to make changes to medical advance directive? No - Patient declined  Copy of Healthcare Power of Attorney in Chart? No - copy requested  Would patient like information on creating a medical advance directive? No - Patient declined     Chief Complaint  Patient presents with   Medical Management of Chronic Issues    HPI:  Pt is a 89 y.o. female seen today for medical management of chronic diseases.    She currently resides on the assisted living unit at [facility]. PMH:    Bilateral lower leg edema- onset 02/2023, resolved with furosemide x 2 days, does not like compression stockings, recent BNP 145 04/13/2023 HTN- BUN/creat 15/0.81 04/13/2023, off metoprolol since 02/2022, see trends below COPD- 05/2022 CT chest noted stable COPD/chronic bronchitis, uses oxygen prn at night, not on oxygen GERD- hgb 11.0 04/13/2023, remains on omeprazole Alzheimer's dementia- MMSE 28/30 (09/30)> was 20/30, 08/2022 CT head noted low-density in the  cerebral white matter that is mild for age, and mild cerebral volume loss, no behaviors, remains on Galantamine and Namenda Recurrent depression- no mood changes, Na+ 140 04/13/2023, remains on Lexapro  No recent falls or injuries:  03/01- 128.2 lbs  02/01- 130.6 lbs  12/01- 128 lbs  Recent blood pressures:  03/26- 136/77  03/19- 109.71    Past Medical History:  Diagnosis Date   Anxiety    Bradycardia    asymptomatic   Breast CA (HCC) 1993 OR 1994   LEFT, SURGERY AND RADIATION DONE   COPD (chronic obstructive pulmonary disease) (HCC)    MILD, NO INHALERS USED   DEGENERATIVE JOINT DISEASE, KNEE    Family history of anesthesia complication    NEPHEW NAUSEA/VOMITING   GENU VALGUM    Hyperlipidemia    Hypertension    Numbness of leg 2011   just left shin   OTHER ACQUIRED DEFORMITY OF ANKLE AND FOOT OTHER    Pneumonia 2013   X 2   PVC's (premature ventricular contractions)    ROTATOR CUFF SYNDROME, LEFT    CANNOT LIFT LEFT ARM ALL THE WAY UP   UNEQUAL LEG LENGTH    UTI (urinary tract infection)    STARTED AUGMENTIN  ON 09-10-13   Past Surgical History:  Procedure Laterality Date   APPENDECTOMY  AGE 70   BREAST LUMPECTOMY Left 1991   radiation   BREAST SURGERY Left 1993 OR 1994   LUMPECTOMY AND RADIATION DONE   JOINT REPLACEMENT     NASAL SINUS SURGERY  2006   REVERSE SHOULDER ARTHROPLASTY Left 07/31/2015   Procedure: LEFT REVERSE SHOULDER ARTHROPLASTY;  Surgeon: Beverely Low, MD;  Location: [facility] OR;  Service: Orthopedics;  Laterality: Left;   TOTAL HIP ARTHROPLASTY Bilateral LEFT 2008 AND RIGHT 2010   TOTAL KNEE ARTHROPLASTY Left 09/17/2013   Procedure: LEFT TOTAL KNEE ARTHROPLASTY;  Surgeon: Shelda Pal, MD;  Location: [facility] ORS;  Service: Orthopedics;  Laterality: Left;    Allergies  Allergen Reactions   Albuterol Other (See Comments)    "Had some rapid heartbeat in 2013 when she took it for some bronchitis"   Diltiazem Other (See Comments)    "Pre-syncope, bp  went a bit low for bp on 180 mg dose"   Naproxen Swelling   Other Nausea Only and Other (See Comments)    AN UNNAMED PAIN MEDICATION CAUSED NAUSEA   Yellow Dye     #10   Cardizem [Diltiazem Hcl] Other (See Comments)    Hypotension   Nitrofurantoin Rash and Other (See Comments)    Sept., 2018      Outpatient Encounter Medications as of 04/21/2023  Medication Sig   acetaminophen (TYLENOL) 325 MG tablet Take 650 mg by mouth every 4 (four) hours as needed.   atorvastatin (LIPITOR) 20 MG tablet Take 20 mg by mouth daily.   Cyanocobalamin (VITAMIN B 12) 500 MCG TABS Take 1,000 mg by mouth daily.   Cyanocobalamin 1000 MCG/ML KIT Inject 1,000 mcg into the skin See admin instructions. Inject 1,000 mcg into the skin every 8 weeks   escitalopram (LEXAPRO) 20 MG tablet Take 20 mg by mouth daily.   furosemide (LASIX) 20 MG tablet Take 1 tablet (20 mg total) by mouth daily for 2 days.   GALANTAMINE HYDROBROMIDE PO Take 16 mg by mouth daily.   memantine (NAMENDA XR) 28 MG CP24 24 hr capsule Take 1 capsule (28 mg total) by mouth daily.   omeprazole (PRILOSEC) 20 MG capsule Take 20 mg by mouth daily.   polyethylene glycol (MIRALAX / GLYCOLAX) 17 g packet Take 17 g by mouth daily as needed for mild constipation.   potassium chloride (KLOR-CON M) 10 MEQ tablet Take 1 tablet (10 mEq total) by mouth daily for 2 days.   Probiotic Product (ALIGN) CHEW Chew 2 tablets by mouth daily.   No facility-administered encounter medications on file as of 04/21/2023.    Review of Systems  Unable to perform ROS: Dementia    Immunization History  Administered Date(s) Administered   DTaP, 5 pertussis antigens 05/11/2015   Influenza Split 04/27/2009, 08/28/2009, 09/29/2010, 11/08/2011, 10/26/2012, 11/08/2013   Influenza, High Dose Seasonal PF 11/13/2021, 11/15/2022   Influenza, Mdck, Trivalent,PF 6+ MOS(egg free) 10/03/2015   Influenza, Quadrivalent, Recombinant, Inj, Pf 10/05/2018, 10/07/2020   Influenza,inj,Quad  PF,6+ Mos 10/29/2012, 11/08/2013, 11/22/2014   Influenza-Unspecified 10/03/2015, 10/18/2016   Moderna Covid-19 Fall Seasonal Vaccine 40yrs & older 11/29/2022   Moderna Covid-19 Seasonal Vaccine 6 months thru 88years of age 58/23/2021, 02/27/2019, 10/01/2019, 05/13/2020, 10/13/2020, 11/30/2020   Moderna Sars-Covid-2 Vaccination 02/27/2019   PNEUMOCOCCAL CONJUGATE-20 06/14/2022   Pneumococcal Polysaccharide-23 08/19/2002, 01/18/2005, 04/27/2009, 08/28/2009   Pneumococcal-Unspecified 05/25/2012   Td (Adult),5 Lf Tetanus Toxid, Preservative Free 08/28/2009   Tdap 04/27/2009, 12/09/2010, 05/11/2015   Zoster Recombinant(Shingrix) 06/01/2017, 06/05/2017, 08/30/2017   Zoster, Live 08/08/2003, 04/27/2009, 05/28/2016   Zoster, Unspecified 05/28/2016, 06/05/2017, 08/30/2017   Pertinent  Health Maintenance Due  Topic Date Due   INFLUENZA VACCINE  08/18/2023   DEXA SCAN  Completed      07/20/2022  4:21 PM 08/22/2022   11:03 AM 10/17/2022   10:16 AM 02/01/2023    4:26 PM 04/11/2023    2:49 PM  Fall Risk  Falls in the past year? 0 1 1 0 0  Was there an injury with Fall? 0 1 0 0 0  Fall Risk Category Calculator 0 2 1 0 0  Patient at Risk for Falls Due to  History of fall(s);Impaired balance/gait History of fall(s);Impaired balance/gait No Fall Risks History of fall(s);Impaired balance/gait  Fall risk Follow up  Falls evaluation completed;Education provided Falls evaluation completed;Education provided;Falls prevention discussed Falls evaluation completed Falls evaluation completed;Education provided   Functional Status Survey:    Vitals:   04/21/23 1245  Pulse: 68  Resp: 19  Temp: (!) 97 F (36.1 C)  SpO2: 94%  Weight: 128 lb 3.2 oz (58.2 kg)  Height: 5\' 6"  (1.676 m)   Body mass index is 20.69 kg/m. Physical Exam Vitals reviewed.  Constitutional:      General: She is not in acute distress. HENT:     Head: Normocephalic.     Right Ear: There is no impacted cerumen.     Left Ear:  There is no impacted cerumen.     Nose: Nose normal.     Mouth/Throat:     Mouth: Mucous membranes are moist.  Eyes:     General:        Right eye: No discharge.        Left eye: No discharge.  Neck:     Vascular: No carotid bruit.  Cardiovascular:     Rate and Rhythm: Normal rate and regular rhythm.     Pulses: Normal pulses.     Heart sounds: Normal heart sounds.  Pulmonary:     Effort: Pulmonary effort is normal. No respiratory distress.     Breath sounds: Normal breath sounds. No wheezing or rales.  Abdominal:     General: Bowel sounds are normal.     Palpations: Abdomen is soft.  Musculoskeletal:     Cervical back: Neck supple.     Right lower leg: No edema.     Left lower leg: No edema.  Skin:    General: Skin is warm.     Capillary Refill: Capillary refill takes less than 2 seconds.  Neurological:     General: No focal deficit present.     Mental Status: She is alert and oriented to person, place, and time.     Gait: Gait abnormal.  Psychiatric:        Mood and Affect: Mood normal.     Comments: Very pleasant, follows commands, alert to self/person/place     Labs reviewed: Recent Labs    05/10/22 0452 05/13/22 0414 05/15/22 0414 05/19/22 0000 09/12/22 0000  NA 132* 134* 132* 137 140  K 3.6 3.8 4.3 4.1 4.2  CL 99 100 98 101 104  CO2 24 27 23  29* 28*  GLUCOSE 93 96 101*  --   --   BUN 15 11 12 11 16   CREATININE 0.78 0.74 0.78 0.8 0.9  CALCIUM 8.2* 8.3* 8.7*  --  9.3  PHOS  --   --  2.8  --   --    Recent Labs    05/09/22 1205 05/15/22 0414 09/12/22 0000  AST 26  --  17  ALT 18  --  9  ALKPHOS 84  --  117  BILITOT 0.9  --   --   PROT 7.3  --   --  ALBUMIN 2.7* 2.6* 3.6   Recent Labs    05/10/22 0452 05/13/22 0414 05/15/22 0414 05/19/22 0000 09/12/22 0000  WBC 7.9 6.3 8.8 7.0 6.1  NEUTROABS  --   --  5.3 4,515.00 4,166.00  HGB 10.8* 10.8* 11.2* 10.1* 12.8  HCT 32.9* 33.5* 34.4* 31* 39  MCV 99.1 98.8 97.5  --   --   PLT 201 198 263 228  191   Lab Results  Component Value Date   TSH 2.980 03/17/2021   No results found for: "HGBA1C" Lab Results  Component Value Date   CHOL 149 09/12/2022   HDL 56 09/12/2022   LDLCALC 75 09/12/2022   TRIG 98 09/12/2022    Significant Diagnostic Results in last 30 days:  No results found.  Assessment/Plan 1. Bilateral leg edema (Primary) - ongoing - recent BNP 145 - noncompliant with compression stockings - will start furosemide/ KCL 2x/week - bmp in 2 weeks  2. Essential hypertension - controlled - metoprolol discontinued 02/2022  3. Chronic obstructive pulmonary disease, unspecified COPD type (HCC) - no recent exacerbations - cont oxygen at night  4. Gastroesophageal reflux disease without esophagitis - hgb stable - cont omeprazole  5. Moderate late onset Alzheimer's dementia without behavioral disturbance, psychotic disturbance, mood disturbance, or anxiety (HCC) - no behaviors - dependent with ADLs except feeding/toileting - cont Galantamine and Namenda  6. Recurrent depression (HCC) - no mood changes - Na+ stable - cont Lexapro    Family/ staff Communication: plan discussed with patient and nurse  Labs/tests ordered:  none

## 2023-04-23 MED ORDER — FUROSEMIDE 20 MG PO TABS
20.0000 mg | ORAL_TABLET | ORAL | Status: DC
Start: 2023-04-24 — End: 2023-06-08

## 2023-04-23 MED ORDER — POTASSIUM CHLORIDE CRYS ER 10 MEQ PO TBCR
10.0000 meq | EXTENDED_RELEASE_TABLET | ORAL | Status: DC
Start: 2023-04-24 — End: 2023-07-27

## 2023-05-04 LAB — BASIC METABOLIC PANEL WITH GFR
BUN: 15 (ref 4–21)
CO2: 30 — AB (ref 13–22)
Chloride: 104 (ref 99–108)
Creatinine: 0.9 (ref 0.5–1.1)
Glucose: 83
Potassium: 4.2 meq/L (ref 3.5–5.1)
Sodium: 139 (ref 137–147)

## 2023-05-04 LAB — COMPREHENSIVE METABOLIC PANEL WITH GFR
Calcium: 9.2 (ref 8.7–10.7)
eGFR: 58

## 2023-05-08 LAB — BASIC METABOLIC PANEL WITH GFR
BUN: 15 (ref 4–21)
CO2: 28 — AB (ref 13–22)
Chloride: 105 (ref 99–108)
Creatinine: 0.9 (ref 0.5–1.1)
Glucose: 71
Potassium: 3.9 meq/L (ref 3.5–5.1)
Sodium: 141 (ref 137–147)

## 2023-05-08 LAB — COMPREHENSIVE METABOLIC PANEL WITH GFR
Calcium: 8.9 (ref 8.7–10.7)
eGFR: 64

## 2023-06-08 ENCOUNTER — Non-Acute Institutional Stay: Payer: Self-pay | Admitting: Internal Medicine

## 2023-06-08 ENCOUNTER — Encounter: Payer: Self-pay | Admitting: Internal Medicine

## 2023-06-08 DIAGNOSIS — W19XXXA Unspecified fall, initial encounter: Secondary | ICD-10-CM

## 2023-06-08 DIAGNOSIS — M25461 Effusion, right knee: Secondary | ICD-10-CM

## 2023-06-08 DIAGNOSIS — M25561 Pain in right knee: Secondary | ICD-10-CM | POA: Diagnosis not present

## 2023-06-08 NOTE — Progress Notes (Signed)
 Location:  [facility] Room Number: 9 A Place of Service:  ALF [phone]) Provider:  Marguerite Shiley, MD   Marguerite Shiley, MD  Patient Care Team: Marguerite Shiley, MD as PCP - General (Internal Medicine) Jacqueline Matsu, MD as PCP - Cardiology (Cardiology)  Extended Emergency Contact Information Primary Emergency Contact: Ruch,Holly Address: [address]          [place], [place] [address] [place] Mobile Phone: [phone] Relation: Niece Secondary Emergency Contact: Ruch,George Address: [address]          [place], [place] [address] [place] Home Phone: [phone] Mobile Phone: [phone] Relation: Nephew  Code Status:  DNR Goals of care: Advanced Directive information    02/01/2023    4:26 PM  Advanced Directives  Does Patient Have a Medical Advance Directive? Yes  Type of Estate agent of [place];Out of facility DNR (pink MOST or yellow form);Living will  Does patient want to make changes to medical advance directive? No - Patient declined  Copy of Healthcare Power of Attorney in Chart? No - copy requested  Would patient like information on creating a medical advance directive? No - Patient declined     Chief Complaint  Patient presents with   Fall    HPI:  Pt is a 89 y.o. 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  COPD Using Oxygen  now only at night Goal 88% or Higher and when she AMbulates Follows with Pulmonary Chronic Interstitial Lung disease on CT scan Work up was negative Thought to be due to Chronic Aspirations   Dysphagia ST recommends Regular Diet but take aspiration precautions   Also h/o  HLD, Depression, Hyponatremia, GERD  Past Medical History:  Diagnosis Date   Anxiety    Bradycardia    asymptomatic   Breast CA (HCC) 1993 OR 1994   LEFT, SURGERY AND RADIATION DONE   COPD (chronic obstructive pulmonary disease) (HCC)    MILD, NO INHALERS USED   DEGENERATIVE JOINT DISEASE, KNEE    Family history of anesthesia complication    NEPHEW NAUSEA/VOMITING   GENU VALGUM    Hyperlipidemia    Hypertension    Numbness of leg 2011   just left shin   OTHER ACQUIRED DEFORMITY OF ANKLE AND FOOT OTHER    Pneumonia 2013   X 2   PVC's (premature ventricular contractions)    ROTATOR CUFF SYNDROME, LEFT    CANNOT LIFT LEFT ARM ALL THE WAY UP   UNEQUAL LEG LENGTH    UTI (urinary tract infection)    STARTED AUGMENTIN   ON 09-10-13   Past Surgical History:  Procedure Laterality Date   APPENDECTOMY  AGE 40   BREAST LUMPECTOMY Left 1991   radiation   BREAST SURGERY Left 1993 OR 1994   LUMPECTOMY AND RADIATION DONE   JOINT REPLACEMENT     NASAL SINUS SURGERY  2006   REVERSE SHOULDER ARTHROPLASTY Left 07/31/2015   Procedure: LEFT REVERSE SHOULDER ARTHROPLASTY;  Surgeon: Winston Hawking, MD;  Location: [facility] OR;  Service: Orthopedics;  Laterality: Left;   TOTAL HIP ARTHROPLASTY Bilateral LEFT 2008 AND RIGHT  2010   TOTAL KNEE ARTHROPLASTY Left 09/17/2013   Procedure: LEFT TOTAL KNEE ARTHROPLASTY;  Surgeon: Bevin Bucks, MD;  Location: [facility] ORS;  Service: Orthopedics;  Laterality: Left;    Allergies  Allergen Reactions   Albuterol Other (See Comments)    "Had some rapid heartbeat in 2013 when she took it for some bronchitis"   Diltiazem Other (See Comments)    "Pre-syncope, bp went a bit low for bp on 180 mg dose"   Naproxen Swelling   Other Nausea Only and Other (See Comments)    AN UNNAMED PAIN MEDICATION CAUSED NAUSEA   Yellow Dye     #10   Cardizem [Diltiazem Hcl] Other (See Comments)    Hypotension   Nitrofurantoin Rash and Other (See Comments)    Sept., 2018       Outpatient Encounter Medications as of 06/08/2023  Medication Sig   acetaminophen  (TYLENOL ) 325 MG tablet Take 650 mg by mouth every 4 (four) hours as needed.   atorvastatin  (LIPITOR) 20 MG tablet Take 20 mg by mouth daily.   Cyanocobalamin  (VITAMIN B 12) 500 MCG TABS Take 1,000 mg by mouth daily.   Cyanocobalamin  1000 MCG/ML KIT Inject 1,000 mcg into the skin See admin instructions. Inject 1,000 mcg into the skin every 8 weeks   escitalopram  (LEXAPRO ) 20 MG tablet Take 20 mg by mouth daily.   GALANTAMINE  HYDROBROMIDE PO Take 16 mg by mouth daily.   memantine  (NAMENDA  XR) 28 MG CP24 24 hr capsule Take 1 capsule (28 mg total) by mouth daily.   omeprazole (PRILOSEC) 20 MG capsule Take 20 mg by mouth daily.   polyethylene glycol (MIRALAX  / GLYCOLAX ) 17 g packet Take 17 g by mouth daily as needed for mild constipation.   Probiotic Product (ALIGN) CHEW Chew 2 tablets by mouth daily.   furosemide  (LASIX ) 20 MG tablet Take 1 tablet (20 mg total) by mouth 2 (two) times a week for 2 days.   potassium chloride  (KLOR-CON  M) 10 MEQ tablet Take 1 tablet (10 mEq total) by mouth 2 (two) times a week for 2 days. Give with furosemide    No facility-administered encounter medications on file as of 06/08/2023.    Review of Systems  Constitutional:  Negative for activity change and appetite change.  HENT: Negative.    Respiratory:  Negative for cough and shortness of breath.   Cardiovascular:  Negative for leg swelling.  Gastrointestinal:  Negative for constipation.  Genitourinary: Negative.   Musculoskeletal:  Positive for gait problem and joint swelling. Negative for arthralgias and myalgias.  Skin: Negative.   Neurological:  Negative for dizziness and weakness.  Psychiatric/Behavioral:  Negative for confusion, dysphoric mood and sleep disturbance.     Immunization History  Administered Date(s) Administered   DTaP, 5 pertussis antigens 05/11/2015   Influenza Split 04/27/2009, 08/28/2009,  09/29/2010, 11/08/2011, 10/26/2012, 11/08/2013   Influenza, High Dose Seasonal PF 11/13/2021, 11/15/2022   Influenza, Mdck, Trivalent,PF 6+ MOS(egg free) 10/03/2015   Influenza, Quadrivalent, Recombinant, Inj, Pf 10/05/2018, 10/07/2020   Influenza,inj,Quad PF,6+ Mos 10/29/2012, 11/08/2013, 11/22/2014   Influenza-Unspecified 10/03/2015, 10/18/2016   Moderna Covid-19 Fall Seasonal Vaccine 88yrs & older 11/29/2022   Moderna Covid-19 Seasonal Vaccine 6 months thru 88years of age 24/23/2021, 02/27/2019, 10/01/2019, 05/13/2020, 10/13/2020, 11/30/2020   Moderna Sars-Covid-2 Vaccination 02/27/2019   PNEUMOCOCCAL CONJUGATE-20 06/14/2022   Pneumococcal Polysaccharide-23 08/19/2002, 01/18/2005, 04/27/2009, 08/28/2009   Pneumococcal-Unspecified 05/25/2012   Td (Adult),5 Lf Tetanus Toxid, Preservative Free 08/28/2009   Tdap 04/27/2009, 12/09/2010, 05/11/2015   Zoster  Recombinant(Shingrix) 06/01/2017, 06/05/2017, 08/30/2017   Zoster, Live 08/08/2003, 04/27/2009, 05/28/2016   Zoster, Unspecified 05/28/2016, 06/05/2017, 08/30/2017   Pertinent  Health Maintenance Due  Topic Date Due   INFLUENZA VACCINE  08/18/2023   DEXA SCAN  Completed      07/20/2022    4:21 PM 08/22/2022   11:03 AM 10/17/2022   10:16 AM 02/01/2023    4:26 PM 04/11/2023    2:49 PM  Fall Risk  Falls in the past year? 0 1 1 0 0  Was there an injury with Fall? 0 1 0 0 0  Fall Risk Category Calculator 0 2 1 0 0  Patient at Risk for Falls Due to  History of fall(s);Impaired balance/gait History of fall(s);Impaired balance/gait No Fall Risks History of fall(s);Impaired balance/gait  Fall risk Follow up  Falls evaluation completed;Education provided Falls evaluation completed;Education provided;Falls prevention discussed Falls evaluation completed Falls evaluation completed;Education provided   Functional Status Survey:    Vitals:   06/08/23 1108  BP: 128/83  Pulse: 84  Resp: 19  Temp: (!) 97.5 F (36.4 C)  SpO2: 93%  Weight: 126  lb (57.2 kg)  Height: 5\' 6"  (1.676 m)   Body mass index is 20.34 kg/m. Physical Exam Vitals reviewed.  Constitutional:      Appearance: Normal appearance.  HENT:     Head: Normocephalic.     Nose: Nose normal.     Mouth/Throat:     Mouth: Mucous membranes are moist.     Pharynx: Oropharynx is clear.  Eyes:     Pupils: Pupils are equal, round, and reactive to light.  Cardiovascular:     Rate and Rhythm: Normal rate and regular rhythm.     Pulses: Normal pulses.     Heart sounds: Normal heart sounds. No murmur heard. Pulmonary:     Effort: Pulmonary effort is normal.     Breath sounds: Normal breath sounds.  Abdominal:     General: Abdomen is flat. Bowel sounds are normal.     Palpations: Abdomen is soft.  Musculoskeletal:        General: No swelling.     Cervical back: Neck supple.     Comments: Right Knee mild swelling with Bruise and Crepitus  Skin:    General: Skin is warm.  Neurological:     General: No focal deficit present.     Mental Status: She is alert.  Psychiatric:        Mood and Affect: Mood normal.        Thought Content: Thought content normal.     Labs reviewed: Recent Labs    09/12/22 0000 05/04/23 0000 05/08/23 0000  NA 140 139 141  K 4.2 4.2 3.9  CL 104 104 105  CO2 28* 30* 28*  BUN 16 15 15   CREATININE 0.9 0.9 0.9  CALCIUM  9.3 9.2 8.9   Recent Labs    09/12/22 0000  AST 17  ALT 9  ALKPHOS 117  ALBUMIN 3.6   Recent Labs    09/12/22 0000  WBC 6.1  NEUTROABS 4,166.00  HGB 12.8  HCT 39  PLT 191   Lab Results  Component Value Date   TSH 2.980 03/17/2021   No results found for: "HGBA1C" Lab Results  Component Value Date   CHOL 149 09/12/2022   HDL 56 09/12/2022   LDLCALC 75 09/12/2022   TRIG 98 09/12/2022    Significant Diagnostic Results in last 30 days:  No results found.  Assessment/Plan 1. Pain and  swelling of right knee (Primary) Xray 2 views Can use Tylenol  PRN  2. Fall, initial encounter Most Likely  Mechanical fall Continue to monitor      Family/ staff Communication:   Labs/tests ordered:

## 2023-07-17 ENCOUNTER — Encounter: Payer: Self-pay | Admitting: Orthopedic Surgery

## 2023-07-17 ENCOUNTER — Non-Acute Institutional Stay: Payer: Self-pay | Admitting: Orthopedic Surgery

## 2023-07-17 DIAGNOSIS — R600 Localized edema: Secondary | ICD-10-CM

## 2023-07-17 DIAGNOSIS — R21 Rash and other nonspecific skin eruption: Secondary | ICD-10-CM

## 2023-07-17 NOTE — Progress Notes (Signed)
 Location:  [facility] Room Number: 9/A Place of Service:  ALF [phone]) Provider:  Greig FORBES Cluster, NP   Charlanne Fredia CROME, MD  Patient Care Team: Charlanne Fredia CROME, MD as PCP - General (Internal Medicine) Shlomo Wilbert SAUNDERS, MD as PCP - Cardiology (Cardiology)  Extended Emergency Contact Information Primary Emergency Contact: Ruch,Holly Address: [address]          [place], [place] [address] [place] Mobile Phone: [phone] Relation: Niece Secondary Emergency Contact: Ruch,George Address: [address]          [place], [place] [address] [place] Home Phone: [phone] Mobile Phone: [phone] Relation: Nephew  Code Status:  DNR Goals of care: Advanced Directive information    02/01/2023    4:26 PM  Advanced Directives  Does Patient Have a Medical Advance Directive? Yes  Type of Estate agent of [place];Out of facility DNR (pink MOST or yellow form);Living will  Does patient want to make changes to medical advance directive? No - Patient declined  Copy of Healthcare Power of Attorney in Chart? No - copy requested  Would patient like information on creating a medical advance directive? No - Patient declined     Chief Complaint  Patient presents with   Acute Visit    rash    HPI:  Pt is a 89 y.o. female seen today for acute visit due to rash.   She currently resides on the assisted living unit at [facility]. PMH: HTN, COPD, GERD, constipation, dementia, OA, breast cancer, anemia and depression.   Poor historian due to dementia. 06/27 rash noted to left upper chest. She denies pain to area. Reports itching is intermittent. No changes to toiletries or detergents per nursing. Scratch marks observed to chest. She is wearing very warm clothing for summer. On call was notified and started triamcinolone  x 7 days. Afebrile. Vitals stable.   Bilateral lower leg edema stable with furosemide  20 mg 2x/week. BUN/creat  15/0.86, K+ 3.9  05/08/2023.    Past Medical History:  Diagnosis Date   Anxiety    Bradycardia    asymptomatic   Breast CA (HCC) 1993 OR 1994   LEFT, SURGERY AND RADIATION DONE   COPD (chronic obstructive pulmonary disease) (HCC)    MILD, NO INHALERS USED   DEGENERATIVE JOINT DISEASE, KNEE    Family history of anesthesia complication    NEPHEW NAUSEA/VOMITING   GENU VALGUM    Hyperlipidemia    Hypertension    Numbness of leg 2011   just left shin   OTHER ACQUIRED DEFORMITY OF ANKLE AND FOOT OTHER    Pneumonia 2013   X 2   PVC's (premature ventricular contractions)    ROTATOR CUFF SYNDROME, LEFT    CANNOT LIFT LEFT ARM ALL THE WAY UP   UNEQUAL LEG LENGTH    UTI (urinary tract infection)    STARTED AUGMENTIN   ON 09-10-13   Past Surgical History:  Procedure Laterality Date   APPENDECTOMY  AGE 89   BREAST LUMPECTOMY Left 1991   radiation   BREAST SURGERY Left 1993 OR 1994   LUMPECTOMY AND RADIATION DONE   JOINT REPLACEMENT     NASAL SINUS SURGERY  2006   REVERSE SHOULDER ARTHROPLASTY Left 07/31/2015   Procedure: LEFT REVERSE SHOULDER ARTHROPLASTY;  Surgeon: Marcey Her, MD;  Location: [facility] OR;  Service: Orthopedics;  Laterality: Left;   TOTAL HIP ARTHROPLASTY Bilateral LEFT 2008 AND RIGHT 2010   TOTAL KNEE  ARTHROPLASTY Left 09/17/2013   Procedure: LEFT TOTAL KNEE ARTHROPLASTY;  Surgeon: Donnice JONETTA Car, MD;  Location: [facility];  Service: Orthopedics;  Laterality: Left;    Allergies  Allergen Reactions   Albuterol Other (See Comments)    Had some rapid heartbeat in 2013 when she took it for some bronchitis   Diltiazem Other (See Comments)    Pre-syncope, bp went a bit low for bp on 180 mg dose   Naproxen Swelling   Other Nausea Only and Other (See Comments)    AN UNNAMED PAIN MEDICATION CAUSED NAUSEA   Yellow Dye     #10   Cardizem [Diltiazem Hcl] Other (See Comments)    Hypotension   Nitrofurantoin Rash and Other (See Comments)    Sept., 2018      Outpatient  Encounter Medications as of 07/17/2023  Medication Sig   acetaminophen  (TYLENOL ) 325 MG tablet Take 650 mg by mouth every 4 (four) hours as needed.   atorvastatin  (LIPITOR) 20 MG tablet Take 20 mg by mouth daily.   Cyanocobalamin  (VITAMIN B 12) 500 MCG TABS Take 1,000 mg by mouth daily.   Cyanocobalamin  1000 MCG/ML KIT Inject 1,000 mcg into the skin See admin instructions. Inject 1,000 mcg into the skin every 8 weeks   escitalopram  (LEXAPRO ) 20 MG tablet Take 20 mg by mouth daily.   GALANTAMINE  HYDROBROMIDE PO Take 16 mg by mouth daily.   memantine  (NAMENDA  XR) 28 MG CP24 24 hr capsule Take 1 capsule (28 mg total) by mouth daily.   omeprazole (PRILOSEC) 20 MG capsule Take 20 mg by mouth daily.   polyethylene glycol (MIRALAX  / GLYCOLAX ) 17 g packet Take 17 g by mouth daily as needed for mild constipation.   potassium chloride  (KLOR-CON  M) 10 MEQ tablet Take 1 tablet (10 mEq total) by mouth 2 (two) times a week for 2 days. Give with furosemide    Probiotic Product (ALIGN) CHEW Chew 2 tablets by mouth daily.   No facility-administered encounter medications on file as of 07/17/2023.    Review of Systems  Unable to perform ROS: Dementia    Immunization History  Administered Date(s) Administered   DTaP, 5 pertussis antigens 05/11/2015   Influenza Split 04/27/2009, 08/28/2009, 09/29/2010, 11/08/2011, 10/26/2012, 11/08/2013   Influenza, High Dose Seasonal PF 11/13/2021, 11/15/2022   Influenza, Mdck, Trivalent,PF 6+ MOS(egg free) 10/03/2015   Influenza, Quadrivalent, Recombinant, Inj, Pf 10/05/2018, 10/07/2020   Influenza,inj,Quad PF,6+ Mos 10/29/2012, 11/08/2013, 11/22/2014   Influenza-Unspecified 10/03/2015, 10/18/2016   Moderna Covid-19 Fall Seasonal Vaccine 75yrs & older 11/29/2022   Moderna Covid-19 Seasonal Vaccine 6 months thru 88years of age 90/23/2021, 02/27/2019, 10/01/2019, 05/13/2020, 10/13/2020, 11/30/2020   Moderna Sars-Covid-2 Vaccination 02/27/2019   PNEUMOCOCCAL CONJUGATE-20  06/14/2022   Pneumococcal Polysaccharide-23 08/19/2002, 01/18/2005, 04/27/2009, 08/28/2009   Pneumococcal-Unspecified 05/25/2012   Td (Adult),5 Lf Tetanus Toxid, Preservative Free 08/28/2009   Tdap 04/27/2009, 12/09/2010, 05/11/2015   Zoster Recombinant(Shingrix) 06/01/2017, 06/05/2017, 08/30/2017   Zoster, Live 08/08/2003, 04/27/2009, 05/28/2016   Zoster, Unspecified 05/28/2016, 06/05/2017, 08/30/2017   Pertinent  Health Maintenance Due  Topic Date Due   INFLUENZA VACCINE  08/18/2023   DEXA SCAN  Completed      07/20/2022    4:21 PM 08/22/2022   11:03 AM 10/17/2022   10:16 AM 02/01/2023    4:26 PM 04/11/2023    2:49 PM  Fall Risk  Falls in the past year? 0 1 1 0 0  Was there an injury with Fall? 0 1 0 0 0  Fall Risk Category  Calculator 0 2 1 0 0  Patient at Risk for Falls Due to  History of fall(s);Impaired balance/gait History of fall(s);Impaired balance/gait No Fall Risks History of fall(s);Impaired balance/gait  Fall risk Follow up  Falls evaluation completed;Education provided Falls evaluation completed;Education provided;Falls prevention discussed Falls evaluation completed Falls evaluation completed;Education provided   Functional Status Survey:    Vitals:   07/17/23 1319  BP: 123/71  Pulse: 61  Resp: 18  Temp: (!) 97.3 F (36.3 C)  SpO2: 96%  Weight: 127 lb (57.6 kg)  Height: 5' 6 (1.676 m)   Body mass index is 20.5 kg/m. Physical Exam Vitals reviewed.  Constitutional:      General: She is not in acute distress. HENT:     Head: Normocephalic.   Eyes:     General:        Right eye: No discharge.        Left eye: No discharge.    Cardiovascular:     Rate and Rhythm: Normal rate and regular rhythm.     Pulses: Normal pulses.     Heart sounds: Normal heart sounds.  Pulmonary:     Effort: Pulmonary effort is normal.     Breath sounds: Normal breath sounds.   Musculoskeletal:        General: Normal range of motion.     Cervical back: Neck supple.      Right lower leg: Edema present.     Left lower leg: Edema present.     Comments: Non pitting edema   Skin:    Capillary Refill: Capillary refill takes less than 2 seconds.     Findings: Rash present.     Comments: Scattered maculopapular rash to left upper chest, scratch marks noted, no vesicles, hives or infection.    Neurological:     General: No focal deficit present.     Mental Status: She is alert. Mental status is at baseline.   Psychiatric:        Mood and Affect: Mood normal.     Labs reviewed: Recent Labs    09/12/22 0000 05/04/23 0000 05/08/23 0000  NA 140 139 141  K 4.2 4.2 3.9  CL 104 104 105  CO2 28* 30* 28*  BUN 16 15 15   CREATININE 0.9 0.9 0.9  CALCIUM  9.3 9.2 8.9   Recent Labs    09/12/22 0000  AST 17  ALT 9  ALKPHOS 117  ALBUMIN 3.6   Recent Labs    09/12/22 0000  WBC 6.1  NEUTROABS 4,166.00  HGB 12.8  HCT 39  PLT 191   Lab Results  Component Value Date   TSH 2.980 03/17/2021   No results found for: HGBA1C Lab Results  Component Value Date   CHOL 149 09/12/2022   HDL 56 09/12/2022   LDLCALC 75 09/12/2022   TRIG 98 09/12/2022    Significant Diagnostic Results in last 30 days:  No results found.  Assessment/Plan 1. Rash (Primary) - noted 06/27> triamcinolone  1% BID x 7 days started - maculopapular rash with scratch marks, no pain, vesicles or hives - no changes in toiletries or detergents - suspect from wearing warm, thick clothing in summer  - cont triamcinolone    2. Bilateral leg edema - non pitting edema - past BNP 145 - refused to wear compression stockings - BUN/creat 15/0.86, K+ 3.9  05/08/2023 - cont furosemide  2x/week    Family/ staff Communication: plan discussed with patient and nurse  Labs/tests ordered:  none

## 2023-07-26 ENCOUNTER — Encounter: Payer: Self-pay | Admitting: Internal Medicine

## 2023-07-26 ENCOUNTER — Non-Acute Institutional Stay (SKILLED_NURSING_FACILITY): Payer: Self-pay | Admitting: Internal Medicine

## 2023-07-26 DIAGNOSIS — G301 Alzheimer's disease with late onset: Secondary | ICD-10-CM

## 2023-07-26 DIAGNOSIS — E538 Deficiency of other specified B group vitamins: Secondary | ICD-10-CM

## 2023-07-26 DIAGNOSIS — F339 Major depressive disorder, recurrent, unspecified: Secondary | ICD-10-CM

## 2023-07-26 DIAGNOSIS — K219 Gastro-esophageal reflux disease without esophagitis: Secondary | ICD-10-CM

## 2023-07-26 DIAGNOSIS — R600 Localized edema: Secondary | ICD-10-CM | POA: Diagnosis not present

## 2023-07-26 DIAGNOSIS — J449 Chronic obstructive pulmonary disease, unspecified: Secondary | ICD-10-CM | POA: Diagnosis not present

## 2023-07-26 DIAGNOSIS — F02B0 Dementia in other diseases classified elsewhere, moderate, without behavioral disturbance, psychotic disturbance, mood disturbance, and anxiety: Secondary | ICD-10-CM

## 2023-07-26 DIAGNOSIS — Z66 Do not resuscitate: Secondary | ICD-10-CM

## 2023-07-26 NOTE — Progress Notes (Addendum)
 Location:  [facility] Room Number: AL09-A Place of Service:  ALF [phone]) Provider:  Charlanne Fredia CROME, MD  Patient Care Team: Charlanne Fredia CROME, MD as PCP - General (Internal Medicine) Shlomo Wilbert SAUNDERS, MD as PCP - Cardiology (Cardiology)  Extended Emergency Contact Information Primary Emergency Contact: Ruch,Holly Address: [address]          [place], [place] [address] [place] Mobile Phone: [phone] Relation: Niece Secondary Emergency Contact: Ruch,George Address: [address]          [place], [place] [address] [place] Home Phone: [phone] Mobile Phone: [phone] Relation: Nephew  Code Status:  DNR Goals of care: Advanced Directive information    02/01/2023    4:26 PM  Advanced Directives  Does Patient Have a Medical Advance Directive? Yes  Type of Estate agent of [place];Out of facility DNR (pink MOST or yellow form);Living will  Does patient want to make changes to medical advance directive? No - Patient declined  Copy of Healthcare Power of Attorney in Chart? No - copy requested  Would patient like information on creating a medical advance directive? No - Patient declined     Chief Complaint  Patient presents with   Medical Management of Chronic Issues    Routine visit    HPI:  Pt is a 89 y.o. female seen today for medical management of chronic diseases.   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 No new Nursing issues. No Behavior issues Her weight is stable Walks with her walker No Recent  Falls Wt Readings  from Last 3 Encounters:  10/18/23 122 lb (55.3 kg)  07/26/23 124 lb 9.6 oz (56.5 kg)  07/17/23 127 lb (57.6 kg)   Past Medical History:  Diagnosis Date   Anxiety    Bradycardia    asymptomatic   Breast CA (HCC) 1993 OR 1994   LEFT, SURGERY AND RADIATION DONE   COPD (chronic obstructive pulmonary disease) (HCC)    MILD, NO INHALERS USED   DEGENERATIVE JOINT DISEASE, KNEE    Family history of anesthesia complication    NEPHEW NAUSEA/VOMITING   GENU VALGUM    Hyperlipidemia    Hypertension    Numbness of leg 2011   just left shin   OTHER ACQUIRED DEFORMITY OF ANKLE AND FOOT OTHER    Pneumonia 2013   X 2   PVC's (premature ventricular contractions)    ROTATOR CUFF SYNDROME, LEFT    CANNOT LIFT LEFT ARM ALL THE WAY UP   UNEQUAL LEG LENGTH    UTI (urinary tract infection)    STARTED AUGMENTIN   ON 09-10-13   Past Surgical History:  Procedure Laterality Date   APPENDECTOMY  AGE 30   BREAST LUMPECTOMY Left 1991   radiation   BREAST SURGERY Left 1993 OR 1994   LUMPECTOMY AND RADIATION DONE   JOINT REPLACEMENT     NASAL SINUS SURGERY  2006   REVERSE SHOULDER ARTHROPLASTY Left 07/31/2015   Procedure: LEFT REVERSE SHOULDER ARTHROPLASTY;  Surgeon: Marcey Her, MD;  Location: [facility] OR;  Service: Orthopedics;  Laterality: Left;   TOTAL HIP ARTHROPLASTY Bilateral LEFT 2008 AND RIGHT 2010   TOTAL KNEE ARTHROPLASTY Left 09/17/2013   Procedure: LEFT TOTAL KNEE ARTHROPLASTY;  Surgeon: Donnice JONETTA Car, MD;  Location: [facility];  Service: Orthopedics;  Laterality: Left;    Allergies  Allergen Reactions   Albuterol Other (See Comments)    Had some rapid heartbeat in 2013 when she took it for some bronchitis   Diltiazem Other (See Comments)    Pre-syncope, bp went a bit low for bp on 180 mg dose   Naproxen Swelling   Other Nausea Only and Other (See Comments)    AN UNNAMED PAIN MEDICATION CAUSED NAUSEA   Yellow Dye #6 ([place] Yellow)     #10   Cardizem [Diltiazem Hcl] Other (See Comments)     Hypotension   Nitrofurantoin Rash and Other (See Comments)    Sept., 2018      Outpatient Encounter Medications as of 07/26/2023  Medication Sig   acetaminophen  (TYLENOL ) 325 MG tablet Take 650 mg by mouth every 4 (four) hours as needed.   atorvastatin  (LIPITOR) 20 MG tablet Take 20 mg by mouth daily.   Cyanocobalamin  (VITAMIN B 12) 500 MCG TABS Take 1,000 mg by mouth daily.   Cyanocobalamin  1000 MCG/ML KIT Inject 1,000 mcg into the skin See admin instructions. Inject 1,000 mcg into the skin every 8 weeks   escitalopram  (LEXAPRO ) 20 MG tablet Take 20 mg by mouth daily.   furosemide  (LASIX ) 10 MG/ML solution Take 10 mg by mouth 2 (two) times a week. Every Monday and Thursday for edema   GALANTAMINE  HYDROBROMIDE PO Take 16 mg by mouth daily.   memantine  (NAMENDA  XR) 28 MG CP24 24 hr capsule Take 1 capsule (28 mg total) by mouth daily.   omeprazole (PRILOSEC) 20 MG capsule Take 20 mg by mouth daily.   polyethylene glycol (MIRALAX  / GLYCOLAX ) 17 g packet Take 17 g by mouth daily as needed for mild constipation. (Patient taking differently: Take 17 g by mouth daily.)   Probiotic Product (ALIGN) CHEW Chew 2 tablets by mouth daily.   potassium chloride  (KLOR-CON  M) 10 MEQ tablet Take 1 tablet (10 mEq total) by mouth 2 (two) times a week for 2 days. Give with furosemide    [DISCONTINUED] potassium chloride  (KLOR-CON  M) 10 MEQ tablet Take 1 tablet (10 mEq total) by mouth 2 (two) times a week for 2 days. Give with furosemide    No facility-administered encounter medications on file as of 07/26/2023.    Review of Systems  Unable to perform ROS: Dementia    Immunization History  Administered Date(s) Administered   DTaP, 5 pertussis antigens 05/11/2015   INFLUENZA, HIGH DOSE SEASONAL PF 11/13/2021, 11/15/2022   Influenza Split 04/27/2009, 08/28/2009, 09/29/2010, 11/08/2011, 10/26/2012, 11/08/2013   Influenza, Mdck, Trivalent,PF 6+ MOS(egg free) 10/03/2015   Influenza, Quadrivalent, Recombinant,  Inj, Pf 10/05/2018, 10/07/2020   Influenza,inj,Quad PF,6+ Mos 10/29/2012, 11/08/2013, 11/22/2014   Influenza-Unspecified 10/03/2015, 10/18/2016   Moderna Covid-19 Fall Seasonal Vaccine 17yrs & older 11/29/2022   Moderna Covid-19 Seasonal Vaccine 6 months thru 88years of age 67/23/2021, 02/27/2019, 10/01/2019, 05/13/2020, 10/13/2020, 11/30/2020   Moderna Sars-Covid-2 Vaccination 02/27/2019   PNEUMOCOCCAL CONJUGATE-20 06/14/2022   Pneumococcal Polysaccharide-23 08/19/2002, 01/18/2005, 04/27/2009, 08/28/2009   Pneumococcal-Unspecified 05/25/2012   Td (Adult),5 Lf Tetanus Toxid, Preservative Free 08/28/2009   Tdap 04/27/2009, 12/09/2010, 05/11/2015   Zoster Recombinant(Shingrix) 06/01/2017, 06/05/2017, 08/30/2017   Zoster, Live 08/08/2003, 04/27/2009, 05/28/2016   Zoster, Unspecified 05/28/2016,  06/05/2017, 08/30/2017   Pertinent  Health Maintenance Due  Topic Date Due   Influenza Vaccine  08/18/2023   DEXA SCAN  Completed      08/22/2022   11:03 AM 10/17/2022   10:16 AM 02/01/2023    4:26 PM 04/11/2023    2:49 PM 07/17/2023    1:49 PM  Fall Risk  Falls in the past year? 1 1 0 0 1  Was there an injury with Fall? 1 0 0 0 0  Fall Risk Category Calculator 2 1 0 0 1  Patient at Risk for Falls Due to History of fall(s);Impaired balance/gait History of fall(s);Impaired balance/gait No Fall Risks History of fall(s);Impaired balance/gait History of fall(s)  Fall risk Follow up Falls evaluation completed;Education provided Falls evaluation completed;Education provided;Falls prevention discussed Falls evaluation completed Falls evaluation completed;Education provided Falls evaluation completed;Education provided   Functional Status Survey:    Vitals:   07/26/23 1445  BP: 108/66  Pulse: 65  Temp: 98.4 F (36.9 C)  SpO2: 95%  Weight: 124 lb 9.6 oz (56.5 kg)  Height: 5' 6 (1.676 m)   Body mass index is 20.11 kg/m. Physical Exam Vitals reviewed.  Constitutional:      Appearance: Normal  appearance.  HENT:     Head: Normocephalic.     Nose: Nose normal.     Mouth/Throat:     Mouth: Mucous membranes are moist.     Pharynx: Oropharynx is clear.  Eyes:     Pupils: Pupils are equal, round, and reactive to light.  Cardiovascular:     Rate and Rhythm: Normal rate and regular rhythm.     Pulses: Normal pulses.     Heart sounds: Normal heart sounds. No murmur heard. Pulmonary:     Effort: Pulmonary effort is normal.     Breath sounds: Normal breath sounds.  Abdominal:     General: Abdomen is flat. Bowel sounds are normal.     Palpations: Abdomen is soft.  Musculoskeletal:        General: No swelling.     Cervical back: Neck supple.  Skin:    General: Skin is warm.  Neurological:     General: No focal deficit present.     Mental Status: She is alert.  Psychiatric:        Mood and Affect: Mood normal.        Thought Content: Thought content normal.     Labs reviewed: Recent Labs    04/13/23 0000 05/04/23 0000 05/08/23 0000  NA 140 139 141  K 3.9 4.2 3.9  CL 107 104 105  CO2 28* 30* 28*  BUN 15 15 15   CREATININE 0.8 0.9 0.9  CALCIUM  8.5* 9.2 8.9   Recent Labs    04/13/23 0000  AST 13  ALT 5*  ALKPHOS 77  ALBUMIN 3.2*   Recent Labs    04/13/23 0000  WBC 6.5  HGB 11.0*  HCT 33*  PLT 169   Lab Results  Component Value Date   TSH 2.980 03/17/2021   No results found for: HGBA1C Lab Results  Component Value Date   CHOL 149 09/12/2022   HDL 56 09/12/2022   LDLCALC 75 09/12/2022   TRIG 98 09/12/2022    Significant Diagnostic Results in last 30 days:  No results found.  Assessment/Plan 1. Bilateral leg edema (Primary)  Lasix  2/week 2. Chronic obstructive pulmonary disease, unspecified COPD type (HCC) Oxygen  at night  3. HLD On statin Will repeat Lipids  4. Moderate late  onset Alzheimer's dementia without behavioral disturbance, psychotic disturbance, mood disturbance, or anxiety (HCC) Doing well in AL On Galantamine  and  Namenda   5. Recurrent depression (HCC) Lexapro   6. Vitamin B 12 deficiency On both PO and Injectoin Repeat levels    Family/ staff Communication:   Labs/tests ordered:  B 12 and lipid panel

## 2023-07-27 MED ORDER — POTASSIUM CHLORIDE CRYS ER 10 MEQ PO TBCR: 10.0000 meq | EXTENDED_RELEASE_TABLET | ORAL | Status: AC

## 2023-10-18 ENCOUNTER — Encounter: Payer: Self-pay | Admitting: Orthopedic Surgery

## 2023-10-18 ENCOUNTER — Non-Acute Institutional Stay: Payer: Self-pay | Admitting: Orthopedic Surgery

## 2023-10-18 DIAGNOSIS — E7800 Pure hypercholesterolemia, unspecified: Secondary | ICD-10-CM | POA: Diagnosis not present

## 2023-10-18 DIAGNOSIS — R600 Localized edema: Secondary | ICD-10-CM

## 2023-10-18 DIAGNOSIS — F339 Major depressive disorder, recurrent, unspecified: Secondary | ICD-10-CM

## 2023-10-18 DIAGNOSIS — G301 Alzheimer's disease with late onset: Secondary | ICD-10-CM

## 2023-10-18 DIAGNOSIS — F02B0 Dementia in other diseases classified elsewhere, moderate, without behavioral disturbance, psychotic disturbance, mood disturbance, and anxiety: Secondary | ICD-10-CM

## 2023-10-18 DIAGNOSIS — R634 Abnormal weight loss: Secondary | ICD-10-CM

## 2023-10-18 DIAGNOSIS — K219 Gastro-esophageal reflux disease without esophagitis: Secondary | ICD-10-CM

## 2023-10-18 DIAGNOSIS — I10 Essential (primary) hypertension: Secondary | ICD-10-CM | POA: Diagnosis not present

## 2023-10-18 NOTE — Progress Notes (Signed)
 Location:  [facility] Room Number: 9/A Place of Service:  SNF [phone]) Provider:  Greig FORBES Cluster, NP   Charlanne Fredia CROME, MD  Patient Care Team: Charlanne Fredia CROME, MD as PCP - General (Internal Medicine) Shlomo Wilbert SAUNDERS, MD as PCP - Cardiology (Cardiology)  Extended Emergency Contact Information Primary Emergency Contact: Ruch,Holly Address: [address]          [place], [place] [address] [place] Mobile Phone: [phone] Relation: Niece Secondary Emergency Contact: Ruch,George Address: [address]          [place], [place] [address] [place] Home Phone: [phone] Mobile Phone: [phone] Relation: Nephew  Code Status:  DNR Goals of care: Advanced Directive information    02/01/2023    4:26 PM  Advanced Directives  Does Patient Have a Medical Advance Directive? Yes  Type of Estate agent of [place];Out of facility DNR (pink MOST or yellow form);Living will  Does patient want to make changes to medical advance directive? No - Patient declined  Copy of Healthcare Power of Attorney in Chart? No - copy requested  Would patient like information on creating a medical advance directive? No - Patient declined     Chief Complaint  Patient presents with   Medical Management of Chronic Issues    HPI:  Pt is a 89 y.o. female seen today for medical management of chronic diseases.    Bilateral lower leg edema- onset 02/2023, BNP 145 04/13/2023, remains on compression stockings and furosemide  HTN- BUN/creat 15/0.81 04/13/2023, off metoprolol  since 02/2022, see trends below HLD- total 136, LDL 66 08/07/2023, remains on atorvastatin  COPD- 05/2022 CT chest noted stable COPD/chronic bronchitis, uses oxygen  prn at night, not on oxygen  GERD- hgb 11.0 04/13/2023, remains on omeprazole Alzheimer's dementia- MMSE 28/30 (09/30)> was 20/30, 08/2022 CT head noted low-density in the cerebral white matter that is mild for age, and  mild cerebral volume loss, no behaviors, remains on Galantamine  and Namenda  Recurrent depression- no mood changes, Na+ 140 04/13/2023, remains on Lexapro   Recent weights:  10/01- 118.8 lbs  08/01- 122 lbs  07/01- 124.6 lbs  06/01- 127 lbs   Recent blood pressures:  09/24- 109/60  09/17- 95/60  09/10- 112/65   Past Medical History:  Diagnosis Date   Anxiety    Bradycardia    asymptomatic   Breast CA (HCC) 1993 OR 1994   LEFT, SURGERY AND RADIATION DONE   COPD (chronic obstructive pulmonary disease) (HCC)    MILD, NO INHALERS USED   DEGENERATIVE JOINT DISEASE, KNEE    Family history of anesthesia complication    NEPHEW NAUSEA/VOMITING   GENU VALGUM    Hyperlipidemia    Hypertension    Numbness of leg 2011   just left shin   OTHER ACQUIRED DEFORMITY OF ANKLE AND FOOT OTHER    Pneumonia 2013   X 2   PVC's (premature ventricular contractions)    ROTATOR CUFF SYNDROME, LEFT    CANNOT LIFT LEFT ARM ALL THE WAY UP   UNEQUAL LEG LENGTH    UTI (urinary tract infection)    STARTED AUGMENTIN   ON 09-10-13   Past Surgical History:  Procedure Laterality Date   APPENDECTOMY  AGE 54   BREAST LUMPECTOMY Left 1991   radiation   BREAST SURGERY Left 1993 OR 1994   LUMPECTOMY AND RADIATION DONE   JOINT REPLACEMENT     NASAL SINUS SURGERY  2006   REVERSE SHOULDER ARTHROPLASTY Left 07/31/2015  Procedure: LEFT REVERSE SHOULDER ARTHROPLASTY;  Surgeon: Marcey Her, MD;  Location: [facility] OR;  Service: Orthopedics;  Laterality: Left;   TOTAL HIP ARTHROPLASTY Bilateral LEFT 2008 AND RIGHT 2010   TOTAL KNEE ARTHROPLASTY Left 09/17/2013   Procedure: LEFT TOTAL KNEE ARTHROPLASTY;  Surgeon: Donnice JONETTA Car, MD;  Location: [facility] ORS;  Service: Orthopedics;  Laterality: Left;    Allergies  Allergen Reactions   Albuterol Other (See Comments)    Had some rapid heartbeat in 2013 when she took it for some bronchitis   Diltiazem Other (See Comments)    Pre-syncope, bp went a bit low for bp on 180 mg  dose   Naproxen Swelling   Other Nausea Only and Other (See Comments)    AN UNNAMED PAIN MEDICATION CAUSED NAUSEA   Yellow Dye #6 (Sunset Yellow)     #10   Cardizem [Diltiazem Hcl] Other (See Comments)    Hypotension   Nitrofurantoin Rash and Other (See Comments)    Sept., 2018      Outpatient Encounter Medications as of 10/18/2023  Medication Sig   acetaminophen  (TYLENOL ) 325 MG tablet Take 650 mg by mouth every 4 (four) hours as needed.   atorvastatin  (LIPITOR) 20 MG tablet Take 20 mg by mouth daily.   Cyanocobalamin  (VITAMIN B 12) 500 MCG TABS Take 1,000 mg by mouth daily.   Cyanocobalamin  1000 MCG/ML KIT Inject 1,000 mcg into the skin See admin instructions. Inject 1,000 mcg into the skin every 8 weeks   escitalopram  (LEXAPRO ) 20 MG tablet Take 20 mg by mouth daily.   furosemide  (LASIX ) 10 MG/ML solution Take 10 mg by mouth 2 (two) times a week. Every Monday and Thursday for edema   GALANTAMINE  HYDROBROMIDE PO Take 16 mg by mouth daily.   memantine  (NAMENDA  XR) 28 MG CP24 24 hr capsule Take 1 capsule (28 mg total) by mouth daily.   omeprazole (PRILOSEC) 20 MG capsule Take 20 mg by mouth daily.   polyethylene glycol (MIRALAX  / GLYCOLAX ) 17 g packet Take 17 g by mouth daily as needed for mild constipation. (Patient taking differently: Take 17 g by mouth daily.)   potassium chloride  (KLOR-CON  M) 10 MEQ tablet Take 1 tablet (10 mEq total) by mouth 2 (two) times a week for 2 days. Give with furosemide    Probiotic Product (ALIGN) CHEW Chew 2 tablets by mouth daily.   No facility-administered encounter medications on file as of 10/18/2023.    Review of Systems  Unable to perform ROS: Dementia    Immunization History  Administered Date(s) Administered   DTaP, 5 pertussis antigens 05/11/2015   INFLUENZA, HIGH DOSE SEASONAL PF 11/13/2021, 11/15/2022   Influenza Split 04/27/2009, 08/28/2009, 09/29/2010, 11/08/2011, 10/26/2012, 11/08/2013   Influenza, Mdck, Trivalent,PF 6+ MOS(egg  free) 10/03/2015   Influenza, Quadrivalent, Recombinant, Inj, Pf 10/05/2018, 10/07/2020   Influenza,inj,Quad PF,6+ Mos 10/29/2012, 11/08/2013, 11/22/2014   Influenza-Unspecified 10/03/2015, 10/18/2016   Moderna Covid-19 Fall Seasonal Vaccine 70yrs & older 11/29/2022   Moderna Covid-19 Seasonal Vaccine 6 months thru 88years of age 65/23/2021, 02/27/2019, 10/01/2019, 05/13/2020, 10/13/2020, 11/30/2020   Moderna Sars-Covid-2 Vaccination 02/27/2019   PNEUMOCOCCAL CONJUGATE-20 06/14/2022   Pneumococcal Polysaccharide-23 08/19/2002, 01/18/2005, 04/27/2009, 08/28/2009   Pneumococcal-Unspecified 05/25/2012   Td (Adult),5 Lf Tetanus Toxid, Preservative Free 08/28/2009   Tdap 04/27/2009, 12/09/2010, 05/11/2015   Zoster Recombinant(Shingrix) 06/01/2017, 06/05/2017, 08/30/2017   Zoster, Live 08/08/2003, 04/27/2009, 05/28/2016   Zoster, Unspecified 05/28/2016, 06/05/2017, 08/30/2017   Pertinent  Health Maintenance Due  Topic Date Due   Influenza Vaccine  08/18/2023   DEXA SCAN  Completed      08/22/2022   11:03 AM 10/17/2022   10:16 AM 02/01/2023    4:26 PM 04/11/2023    2:49 PM 07/17/2023    1:49 PM  Fall Risk  Falls in the past year? 1 1 0 0 1  Was there an injury with Fall? 1 0 0 0 0  Fall Risk Category Calculator 2 1 0 0 1  Patient at Risk for Falls Due to History of fall(s);Impaired balance/gait History of fall(s);Impaired balance/gait No Fall Risks History of fall(s);Impaired balance/gait History of fall(s)  Fall risk Follow up Falls evaluation completed;Education provided Falls evaluation completed;Education provided;Falls prevention discussed Falls evaluation completed Falls evaluation completed;Education provided Falls evaluation completed;Education provided   Functional Status Survey:    Vitals:   10/18/23 1317  BP: 109/60  Pulse: 72  Resp: 18  Temp: 97.7 F (36.5 C)  SpO2: 93%  Weight: 122 lb (55.3 kg)  Height: 5' 6 (1.676 m)   Body mass index is 19.69 kg/m. Physical  Exam Vitals reviewed.  Constitutional:      General: She is not in acute distress.    Comments: Pale complexion  HENT:     Head: Normocephalic.     Right Ear: There is no impacted cerumen.     Left Ear: There is no impacted cerumen.     Nose: Nose normal.     Mouth/Throat:     Mouth: Mucous membranes are moist.  Eyes:     General:        Right eye: No discharge.        Left eye: No discharge.  Cardiovascular:     Rate and Rhythm: Normal rate and regular rhythm.     Pulses: Normal pulses.     Heart sounds: Normal heart sounds.  Pulmonary:     Effort: Pulmonary effort is normal. No respiratory distress.     Breath sounds: Normal breath sounds. No wheezing or rales.  Abdominal:     General: Bowel sounds are normal. There is no distension.     Palpations: Abdomen is soft.     Tenderness: There is no abdominal tenderness.  Musculoskeletal:     Cervical back: Neck supple.     Right lower leg: Edema present.     Left lower leg: Edema present.     Comments: Non pitting, compression stockings on  Skin:    General: Skin is warm.     Capillary Refill: Capillary refill takes less than 2 seconds.  Neurological:     General: No focal deficit present.     Mental Status: She is alert. Mental status is at baseline.     Motor: No weakness.     Gait: Gait abnormal.  Psychiatric:        Mood and Affect: Mood normal.     Comments: Alert to self/familiar face/place, follows commands, very pleasant     Labs reviewed: Recent Labs    04/13/23 0000 05/04/23 0000 05/08/23 0000  NA 140 139 141  K 3.9 4.2 3.9  CL 107 104 105  CO2 28* 30* 28*  BUN 15 15 15   CREATININE 0.8 0.9 0.9  CALCIUM  8.5* 9.2 8.9   Recent Labs    04/13/23 0000  AST 13  ALT 5*  ALKPHOS 77  ALBUMIN 3.2*   Recent Labs    04/13/23 0000  WBC 6.5  HGB 11.0*  HCT 33*  PLT 169   Lab Results  Component  Value Date   TSH 2.980 03/17/2021   No results found for: HGBA1C Lab Results  Component Value Date    CHOL 149 09/12/2022   HDL 56 09/12/2022   LDLCALC 75 09/12/2022   TRIG 98 09/12/2022    Significant Diagnostic Results in last 30 days:  No results found.  Assessment/Plan 1. Weight loss (Primary) - progressive weight loss x 3 months - weight down 9 lbs - start weekly weights - start Boost daily  - check TSH  2. Bilateral leg edema - improved - cont furosemide /KCL and compression  3. Essential hypertension - controlled without medication - metoprolol  discontinued  - see above  4. Pure hypercholesterolemia - LDL was 66 07/2023 - cont atorvastatin   5. Gastroesophageal reflux disease without esophagitis - cont omeprazole  6. Moderate late onset Alzheimer's dementia without behavioral disturbance, psychotic disturbance, mood disturbance, or anxiety (HCC) - no behaviors - some weight loss - dependent with some ADLs - cont Namenda  and Galantamine   7. Recurrent depression -mood stable - cont Lexapro     Family/ staff Communication: plan discussed with patient and nurse  Labs/tests ordered:  cbc/diff, cmp, TSH 10/06

## 2023-11-01 ENCOUNTER — Non-Acute Institutional Stay: Payer: Self-pay | Admitting: Orthopedic Surgery

## 2023-11-01 ENCOUNTER — Encounter: Payer: Self-pay | Admitting: Orthopedic Surgery

## 2023-11-01 DIAGNOSIS — Z0000 Encounter for general adult medical examination without abnormal findings: Secondary | ICD-10-CM

## 2023-11-01 NOTE — Progress Notes (Signed)
 Subjective:   [name] [name] is a 89 y.o. female who presents for Medicare Annual (Subsequent) preventive examination.  Visit Complete: In person  Patient Medicare AWV questionnaire was completed by the patient on 11/01/2023; I have confirmed that all information answered by patient is correct and no changes since this date.  Cardiac Risk Factors include: advanced age (>24men, >8 women);hypertension;sedentary lifestyle     Objective:    Today's Vitals   11/01/23 1516  BP: 120/62  Pulse: 72  Resp: 20  Temp: 98.3 F (36.8 C)  SpO2: 92%  Weight: 118 lb 12.8 oz (53.9 kg)  Height: 5' 6 (1.676 m)   Body mass index is 19.17 kg/m.     02/01/2023    4:26 PM 12/27/2022    4:06 PM 10/17/2022    9:55 AM 08/22/2022    9:47 AM 08/20/2022    9:17 AM 07/20/2022    4:22 PM 05/16/2022    3:05 PM  Advanced Directives  Does Patient Have a Medical Advance Directive? Yes Yes Yes Yes No Yes No  Type of Estate agent of [place];Out of facility DNR (pink MOST or yellow form);Living will Healthcare Power of [place];Out of facility DNR (pink MOST or yellow form);Living will Healthcare Power of [place];Living will;Out of facility DNR (pink MOST or yellow form) Out of facility DNR (pink MOST or yellow form)     Does patient want to make changes to medical advance directive? No - Patient declined No - Patient declined No - Patient declined No - Patient declined  No - Patient declined   Copy of Healthcare Power of Attorney in Chart? No - copy requested No - copy requested No - copy requested      Would patient like information on creating a medical advance directive? No - Patient declined No - Patient declined   No - Patient declined  No - Patient declined    Current Medications (verified) Outpatient Encounter Medications as of 11/01/2023  Medication Sig   acetaminophen  (TYLENOL ) 325 MG tablet Take 650 mg by mouth every 4 (four) hours as needed.   atorvastatin  (LIPITOR) 20 MG  tablet Take 20 mg by mouth daily.   Cyanocobalamin  (VITAMIN B 12) 500 MCG TABS Take 1,000 mg by mouth daily.   Cyanocobalamin  1000 MCG/ML KIT Inject 1,000 mcg into the skin See admin instructions. Inject 1,000 mcg into the skin every 8 weeks   escitalopram  (LEXAPRO ) 20 MG tablet Take 20 mg by mouth daily.   furosemide  (LASIX ) 10 MG/ML solution Take 10 mg by mouth 2 (two) times a week. Every Monday and Thursday for edema   GALANTAMINE  HYDROBROMIDE PO Take 16 mg by mouth daily.   memantine  (NAMENDA  XR) 28 MG CP24 24 hr capsule Take 1 capsule (28 mg total) by mouth daily.   omeprazole (PRILOSEC) 20 MG capsule Take 20 mg by mouth daily.   polyethylene glycol (MIRALAX  / GLYCOLAX ) 17 g packet Take 17 g by mouth daily as needed for mild constipation. (Patient taking differently: Take 17 g by mouth daily.)   potassium chloride  (KLOR-CON  M) 10 MEQ tablet Take 1 tablet (10 mEq total) by mouth 2 (two) times a week for 2 days. Give with furosemide    Probiotic Product (ALIGN) CHEW Chew 2 tablets by mouth daily.   No facility-administered encounter medications on file as of 11/01/2023.    Allergies (verified) Albuterol, Diltiazem, Naproxen, Other, Yellow dye #6 (sunset yellow), Cardizem [diltiazem hcl], and Nitrofurantoin   History: Past Medical History:  Diagnosis Date   Anxiety    Bradycardia    asymptomatic   Breast CA (HCC) 1993 OR 1994   LEFT, SURGERY AND RADIATION DONE   COPD (chronic obstructive pulmonary disease) (HCC)    MILD, NO INHALERS USED   DEGENERATIVE JOINT DISEASE, KNEE    Family history of anesthesia complication    NEPHEW NAUSEA/VOMITING   GENU VALGUM    Hyperlipidemia    Hypertension    Numbness of leg 2011   just left shin   OTHER ACQUIRED DEFORMITY OF ANKLE AND FOOT OTHER    Pneumonia 2013   X 2   PVC's (premature ventricular contractions)    ROTATOR CUFF SYNDROME, LEFT    CANNOT LIFT LEFT ARM ALL THE WAY UP   UNEQUAL LEG LENGTH    UTI (urinary tract infection)     STARTED AUGMENTIN   ON 09-10-13   Past Surgical History:  Procedure Laterality Date   APPENDECTOMY  AGE 33   BREAST LUMPECTOMY Left 1991   radiation   BREAST SURGERY Left 1993 OR 1994   LUMPECTOMY AND RADIATION DONE   JOINT REPLACEMENT     NASAL SINUS SURGERY  2006   REVERSE SHOULDER ARTHROPLASTY Left 07/31/2015   Procedure: LEFT REVERSE SHOULDER ARTHROPLASTY;  Surgeon: Marcey Her, MD;  Location: [facility];  Service: Orthopedics;  Laterality: Left;   TOTAL HIP ARTHROPLASTY Bilateral LEFT 2008 AND RIGHT 2010   TOTAL KNEE ARTHROPLASTY Left 09/17/2013   Procedure: LEFT TOTAL KNEE ARTHROPLASTY;  Surgeon: Donnice JONETTA Car, MD;  Location: [facility];  Service: Orthopedics;  Laterality: Left;   Family History  Problem Relation Age of Onset   Diabetes Mother    Heart Problems Father    Hypertension Brother    Social History   Socioeconomic History   Marital status: Widowed    Spouse name: Not on file   Number of children: Not on file   Years of education: Not on file   Highest education level: Not on file  Occupational History   Not on file  Tobacco Use   Smoking status: Former    Current packs/day: 0.00    Average packs/day: 0.3 packs/day for 15.0 years (3.8 ttl pk-yrs)    Types: Cigarettes    Start date: 01/17/1977    Quit date: 01/18/1992    Years since quitting: 31.8    Passive exposure: Never   Smokeless tobacco: Never  Substance and Sexual Activity   Alcohol  use: Yes    Comment: WINE 2 GLASSES PER DAY   Drug use: No   Sexual activity: Not on file  Other Topics Concern   Not on file  Social History Narrative   Not on file   Social Drivers of Health   Financial Resource Strain: Low Risk  (11/01/2023)   Overall Financial Resource Strain (CARDIA)    Difficulty of Paying Living Expenses: Not hard at all  Food Insecurity: No Food Insecurity (11/01/2023)   Hunger Vital Sign    Worried About Running Out of Food in the Last Year: Never true    Ran Out of Food in the Last Year: Never  true  Transportation Needs: No Transportation Needs (11/01/2023)   PRAPARE - Administrator, Civil Service (Medical): No    Lack of Transportation (Non-Medical): No  Physical Activity: Insufficiently Active (11/01/2023)   Exercise Vital Sign    Days of Exercise per Week: 5 days    Minutes of Exercise per Session: 10 min  Stress: No Stress Concern  Present (11/01/2023)   [employer] of Occupational Health - Occupational Stress Questionnaire    Feeling of Stress: Not at all  Social Connections: Socially Isolated (11/01/2023)   Social Connection and Isolation Panel    Frequency of Communication with Friends and Family: More than three times a week    Frequency of Social Gatherings with Friends and Family: More than three times a week    Attends Religious Services: Never    Database administrator or Organizations: No    Attends Banker Meetings: Never    Marital Status: Widowed    Tobacco Counseling Counseling given: Not Answered   Clinical Intake:  Pre-visit preparation completed: No  Pain : No/denies pain     BMI - recorded: 19.17 Nutritional Status: BMI of 19-24  Normal Nutritional Risks: None Diabetes: No  How often do you need to have someone help you when you read instructions, pamphlets, or other written materials from your doctor or pharmacy?: 4 - Often What is the last grade level you completed in school?: unable to recall  Interpreter Needed?: No      Activities of Daily Living    11/01/2023    3:19 PM  In your present state of health, do you have any difficulty performing the following activities:  Hearing? 0  Vision? 0  Difficulty concentrating or making decisions? 1  Walking or climbing stairs? 1  Dressing or bathing? 1  Doing errands, shopping? 1  Preparing Food and eating ? Y  Using the Toilet? Y  In the past six months, have you accidently leaked urine? Y  Do you have problems with loss of bowel control? Y   Managing your Medications? Y  Managing your Finances? Y  Housekeeping or managing your Housekeeping? Y    Patient Care Team: Charlanne Fredia CROME, MD as PCP - General (Internal Medicine) Shlomo Wilbert SAUNDERS, MD as PCP - Cardiology (Cardiology)  Indicate any recent Medical Services you may have received from other than [facility] providers in the past year (date may be approximate).     Assessment:   This is a routine wellness examination for Tomma.  Hearing/Vision screen No results found.   Goals Addressed             This Visit's Progress    Maintain Mobility and Function   On track    Evidence-based guidance:  Acknowledge and validate impact of pain, loss of strength and potential disfigurement (hand osteoarthritis) on mental health and daily life, such as social isolation, anxiety, depression, impaired sexual relationship and   injury from falls.  Anticipate referral to physical or occupational therapy for assessment, therapeutic exercise and recommendation for adaptive equipment or assistive devices; encourage participation.  Assess impact on ability to perform activities of daily living, as well as engage in sports and leisure events or requirements of work or school.  Provide anticipatory guidance and reassurance about the benefit of exercise to maintain function; acknowledge and normalize fear that exercise may worsen symptoms.  Encourage regular exercise, at least 10 minutes at a time for 45 minutes per week; consider yoga, water exercise and proprioceptive exercises; encourage use of wearable activity tracker to increase motivation and adherence.  Encourage maintenance or resumption of daily activities, including employment, as pain allows and with minimal exposure to trauma.  Assist patient to advocate for adaptations to the work environment.  Consider level of pain and function, gender, age, lifestyle, patient preference, quality of life, readiness and ?ocapacity to benefit?  when recommending patients for orthopaedic surgery consultation.  Explore strategies, such as changes to medication regimen or activity that enables patient to anticipate and manage flare-ups that increase deconditioning and disability.  Explore patient preferences; encourage exposure to a broader range of activities that have been avoided for fear of experiencing pain.  Identify barriers to participation in therapy or exercise, such as pain with activity, anticipated or imagined pain.  Monitor postoperative joint replacement or any preexisting joint replacement for ongoing pain and loss of function; provide social support and encouragement throughout recovery.   Notes:        Depression Screen    11/01/2023    3:21 PM 07/17/2023    1:49 PM 04/11/2023    2:49 PM 02/01/2023    4:26 PM 10/17/2022   10:14 AM 07/20/2022    4:22 PM  PHQ 2/9 Scores  PHQ - 2 Score 0 0 0 0 0 0    Fall Risk    11/01/2023    3:22 PM 07/17/2023    1:49 PM 04/11/2023    2:49 PM 02/01/2023    4:26 PM 10/17/2022   10:16 AM  Fall Risk   Falls in the past year? 1 1 0 0 1  Number falls in past yr: 0 0 0 0 0  Injury with Fall? 0 0 0 0 0  Risk for fall due to : History of fall(s);Impaired balance/gait History of fall(s) History of fall(s);Impaired balance/gait No Fall Risks History of fall(s);Impaired balance/gait  Follow up Falls evaluation completed Falls evaluation completed;Education provided Falls evaluation completed;Education provided Falls evaluation completed Falls evaluation completed;Education provided;Falls prevention discussed    MEDICARE RISK AT HOME: Medicare Risk at Home Any stairs in or around the home?: No If so, are there any without handrails?: No Home free of loose throw rugs in walkways, pet beds, electrical cords, etc?: Yes Adequate lighting in your home to reduce risk of falls?: Yes Life alert?: No Use of a cane, walker or w/c?: Yes Grab bars in the bathroom?: Yes Shower chair or bench in  shower?: Yes Elevated toilet seat or a handicapped toilet?: Yes  TIMED UP AND GO:  Was the test performed?  No    Cognitive Function:    11/01/2023    3:22 PM 10/17/2022   10:16 AM  MMSE - Mini Mental State Exam  Not completed: Unable to complete   Orientation to time  5  Orientation to Place  5  Registration  3  Attention/ Calculation  5  Recall  2  Language- name 2 objects  2  Language- repeat  1  Language- follow 3 step command  3  Language- read & follow direction  1  Copy design  1        11/01/2023    3:22 PM  6CIT Screen  What Year? 4 points  What month? 3 points  What time? 3 points  Count back from 20 2 points  Months in reverse 0 points  Repeat phrase 6 points  Total Score 18 points    Immunizations Immunization History  Administered Date(s) Administered   DTaP, 5 pertussis antigens 05/11/2015   INFLUENZA, HIGH DOSE SEASONAL PF 11/13/2021, 11/15/2022   Influenza Split 04/27/2009, 08/28/2009, 09/29/2010, 11/08/2011, 10/26/2012, 11/08/2013   Influenza, Mdck, Trivalent,PF 6+ MOS(egg free) 10/03/2015   Influenza, Quadrivalent, Recombinant, Inj, Pf 10/05/2018, 10/07/2020   Influenza,inj,Quad PF,6+ Mos 10/29/2012, 11/08/2013, 11/22/2014   Influenza-Unspecified 10/03/2015, 10/18/2016   Moderna Covid-19 Fall Seasonal Vaccine 43yrs & older 11/29/2022  Moderna Covid-19 Seasonal Vaccine 6 months thru 88years of age 73/23/2021, 02/27/2019, 10/01/2019, 05/13/2020, 10/13/2020, 11/30/2020   Moderna Sars-Covid-2 Vaccination 02/27/2019   PNEUMOCOCCAL CONJUGATE-20 06/14/2022   Pneumococcal Polysaccharide-23 08/19/2002, 01/18/2005, 04/27/2009, 08/28/2009   Pneumococcal-Unspecified 05/25/2012   Td (Adult),5 Lf Tetanus Toxid, Preservative Free 08/28/2009   Tdap 04/27/2009, 12/09/2010, 05/11/2015   Zoster Recombinant(Shingrix) 06/01/2017, 06/05/2017, 08/30/2017   Zoster, Live 08/08/2003, 04/27/2009, 05/28/2016   Zoster, Unspecified 05/28/2016, 06/05/2017, 08/30/2017     TDAP status: Up to date  Flu Vaccine status: Due, Education has been provided regarding the importance of this vaccine. Advised may receive this vaccine at local pharmacy or Health Dept. Aware to provide a copy of the vaccination record if obtained from local pharmacy or Health Dept. Verbalized acceptance and understanding.  Pneumococcal vaccine status: Up to date  Covid-19 vaccine status: Completed vaccines  Qualifies for Shingles Vaccine? Yes   Zostavax completed Yes   Shingrix Completed?: Yes  Screening Tests Health Maintenance  Topic Date Due   Mammogram  09/12/2021   Influenza Vaccine  08/18/2023   COVID-19 Vaccine (3 - Moderna risk series) 11/23/2023 (Originally 12/27/2022)   Medicare Annual Wellness (AWV)  10/31/2024   DTaP/Tdap/Td (6 - Td or Tdap) 05/10/2025   Pneumococcal Vaccine: 50+ Years  Completed   DEXA SCAN  Completed   Zoster Vaccines- Shingrix  Completed   Meningococcal B Vaccine  Aged Out    Health Maintenance  Health Maintenance Due  Topic Date Due   Mammogram  09/12/2021   Influenza Vaccine  08/18/2023    Colorectal cancer screening: No longer required.   Mammogram status: No longer required due to advanced age.  Bone Density status: Completed 10/2019. Results reflect: Bone density results: OSTEOPOROSIS. Repeat every 2-5 years.  Lung Cancer Screening: (Low Dose CT Chest recommended if Age 61-80 years, 20 pack-year currently smoking OR have quit w/in 15years.) does not qualify.   Lung Cancer Screening Referral: No  Additional Screening:  Hepatitis C Screening: does not qualify; Completed   Vision Screening: Recommended annual ophthalmology exams for early detection of glaucoma and other disorders of the eye. Is the patient up to date with their annual eye exam?  No  Who is the provider or what is the name of the office in which the patient attends annual eye exams? [facility] if needed If pt is not established with a provider, would  they like to be referred to a provider to establish care? No .   Dental Screening: Recommended annual dental exams for proper oral hygiene  Diabetic Foot Exam: Diabetic Foot Exam: Completed 11/01/2023  Community Resource Referral / Chronic Care Management: CRR required this visit?  No   CCM required this visit?  No     Plan:     I have personally reviewed and noted the following in the patient's chart:   Medical and social history Use of alcohol , tobacco or illicit drugs  Current medications and supplements including opioid prescriptions. Patient is not currently taking opioid prescriptions. Functional ability and status Nutritional status Physical activity Advanced directives List of other physicians Hospitalizations, surgeries, and ER visits in previous 12 months Vitals Screenings to include cognitive, depression, and falls Referrals and appointments  In addition, I have reviewed and discussed with patient certain preventive protocols, quality metrics, and best practice recommendations. A written personalized care plan for preventive services as well as general preventive health recommendations were provided to patient.     Greig FORBES Cluster, NP   11/01/2023   After  Visit Summary: (MyChart) Due to this being a telephonic visit, the after visit summary with patients personalized plan was offered to patient via MyChart   Nurse Notes: Patient resides in assisted living due to advanced dementia. UTD on vaccinations. Facility to administer flu/covid vaccinations next 2 weeks. Patient does not want future mammograms due to age.

## 2024-02-08 ENCOUNTER — Encounter: Payer: Self-pay | Admitting: Internal Medicine

## 2024-02-08 ENCOUNTER — Non-Acute Institutional Stay: Payer: Self-pay | Admitting: Internal Medicine

## 2024-02-08 DIAGNOSIS — K219 Gastro-esophageal reflux disease without esophagitis: Secondary | ICD-10-CM

## 2024-02-08 DIAGNOSIS — J449 Chronic obstructive pulmonary disease, unspecified: Secondary | ICD-10-CM | POA: Diagnosis not present

## 2024-02-08 DIAGNOSIS — F339 Major depressive disorder, recurrent, unspecified: Secondary | ICD-10-CM

## 2024-02-08 DIAGNOSIS — G301 Alzheimer's disease with late onset: Secondary | ICD-10-CM | POA: Diagnosis not present

## 2024-02-08 DIAGNOSIS — E538 Deficiency of other specified B group vitamins: Secondary | ICD-10-CM

## 2024-02-08 DIAGNOSIS — E7800 Pure hypercholesterolemia, unspecified: Secondary | ICD-10-CM | POA: Diagnosis not present

## 2024-02-08 DIAGNOSIS — R600 Localized edema: Secondary | ICD-10-CM | POA: Diagnosis not present

## 2024-02-08 DIAGNOSIS — F02B0 Dementia in other diseases classified elsewhere, moderate, without behavioral disturbance, psychotic disturbance, mood disturbance, and anxiety: Secondary | ICD-10-CM | POA: Diagnosis not present

## 2024-02-08 NOTE — Progress Notes (Signed)
 "  Location:  [facility] Service:  [facility] ([id number])  Provider:   Code Status: DNR Goals of Care:     02/01/2023    4:26 PM  Advanced Directives  Does Patient Have a Medical Advance Directive? Yes  Type of Estate Agent of [place];Out of facility DNR (pink MOST or yellow form);Living will  Does patient want to make changes to medical advance directive? No - Patient declined  Copy of Healthcare Power of Attorney in Chart? No - copy requested  Would patient like information on creating a medical advance directive? No - Patient declined     Chief Complaint  Patient presents with   Care Management    HPI: Patient is a 89 y.o. female seen today for medical management of chronic diseases.    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 No new Nursing issues. No Behavior issues Her weight is stable Walks with her walker No Falls Wt Readings from Last 3 Encounters:  02/08/24 120 lb 9.6 oz (54.7 kg)  11/01/23 118 lb 12.8 oz (53.9 kg)  10/18/23 122 lb (55.3 kg)   Past Medical History:  Diagnosis Date   Anxiety    Bradycardia    asymptomatic   Breast CA (HCC) 1993 OR 1994   LEFT, SURGERY AND RADIATION DONE   COPD (chronic obstructive pulmonary disease) (HCC)    MILD, NO INHALERS USED   DEGENERATIVE JOINT DISEASE, KNEE    Family history of anesthesia complication    NEPHEW NAUSEA/VOMITING   GENU VALGUM    Hyperlipidemia    Hypertension    Numbness of leg 2011   just left shin   OTHER ACQUIRED DEFORMITY OF ANKLE AND FOOT OTHER    Pneumonia 2013   X 2   PVC's (premature ventricular  contractions)    ROTATOR CUFF SYNDROME, LEFT    CANNOT LIFT LEFT ARM ALL THE WAY UP   UNEQUAL LEG LENGTH    UTI (urinary tract infection)    STARTED AUGMENTIN   ON 09-10-13    Past Surgical History:  Procedure Laterality Date   APPENDECTOMY  AGE 67   BREAST LUMPECTOMY Left 1991   radiation   BREAST SURGERY Left 1993 OR 1994   LUMPECTOMY AND RADIATION DONE   JOINT REPLACEMENT     NASAL SINUS SURGERY  2006   REVERSE SHOULDER ARTHROPLASTY Left 07/31/2015   Procedure: LEFT REVERSE SHOULDER ARTHROPLASTY;  Surgeon: Marcey Her, MD;  Location: [facility] OR;  Service: Orthopedics;  Laterality: Left;   TOTAL HIP ARTHROPLASTY Bilateral LEFT 2008 AND RIGHT 2010   TOTAL KNEE ARTHROPLASTY Left 09/17/2013   Procedure: LEFT TOTAL KNEE ARTHROPLASTY;  Surgeon: Donnice JONETTA Car, MD;  Location: [facility];  Service: Orthopedics;  Laterality: Left;    Allergies[1]  Outpatient Encounter Medications as of 02/08/2024  Medication Sig   acetaminophen  (TYLENOL ) 325 MG tablet Take 650 mg by mouth every 4 (four) hours as needed.   atorvastatin  (LIPITOR) 20 MG tablet Take 20 mg by mouth daily.   Cyanocobalamin  (VITAMIN B 12) 500 MCG TABS Take 1,000  mg by mouth daily.   Cyanocobalamin  1000 MCG/ML KIT Inject 1,000 mcg into the skin See admin instructions. Inject 1,000 mcg into the skin every 8 weeks   escitalopram  (LEXAPRO ) 20 MG tablet Take 20 mg by mouth daily.   furosemide  (LASIX ) 10 MG/ML solution Take 10 mg by mouth 2 (two) times a week. Every Monday and Thursday for edema   GALANTAMINE  HYDROBROMIDE PO Take 16 mg by mouth daily.   memantine  (NAMENDA  XR) 28 MG CP24 24 hr capsule Take 1 capsule (28 mg total) by mouth daily.   omeprazole (PRILOSEC) 20 MG capsule Take 20 mg by mouth daily.   potassium chloride  (KLOR-CON  M) 10 MEQ tablet Take 1 tablet (10 mEq total) by mouth 2 (two) times a week for 2 days. Give with furosemide    Probiotic Product (ALIGN) CHEW Chew 2 tablets by mouth daily.   No facility-administered  encounter medications on file as of 02/08/2024.    Review of Systems:  Review of Systems  Unable to perform ROS: Dementia    Health Maintenance  Topic Date Due   COVID-19 Vaccine (3 - Moderna risk series) 12/27/2022   Influenza Vaccine  08/18/2023   Medicare Annual Wellness (AWV)  10/31/2024   DTaP/Tdap/Td (6 - Td or Tdap) 05/10/2025   Pneumococcal Vaccine: 50+ Years  Completed   Bone Density Scan  Completed   Zoster Vaccines- Shingrix  Completed   Meningococcal B Vaccine  Aged Out   Mammogram  Discontinued    Physical Exam: Vitals:   02/08/24 1356  BP: 97/62  Pulse: 68  Resp: 17  Temp: 97.7 F (36.5 C)  Weight: 120 lb 9.6 oz (54.7 kg)   Body mass index is 19.47 kg/m. Physical Exam Vitals reviewed.  Constitutional:      Appearance: Normal appearance.  HENT:     Head: Normocephalic.     Nose: Nose normal.     Mouth/Throat:     Mouth: Mucous membranes are moist.     Pharynx: Oropharynx is clear.  Eyes:     Pupils: Pupils are equal, round, and reactive to light.  Cardiovascular:     Rate and Rhythm: Normal rate and regular rhythm.     Pulses: Normal pulses.     Heart sounds: Normal heart sounds. No murmur heard. Pulmonary:     Effort: Pulmonary effort is normal.     Breath sounds: Normal breath sounds.  Abdominal:     General: Abdomen is flat. Bowel sounds are normal.     Palpations: Abdomen is soft.  Musculoskeletal:        General: Swelling present.     Cervical back: Neck supple.  Skin:    General: Skin is warm.  Neurological:     General: No focal deficit present.     Mental Status: She is alert.  Psychiatric:        Mood and Affect: Mood normal.        Thought Content: Thought content normal.     Labs reviewed: Basic Metabolic Panel: Recent Labs    04/13/23 0000 05/04/23 0000 05/08/23 0000  NA 140 139 141  K 3.9 4.2 3.9  CL 107 104 105  CO2 28* 30* 28*  BUN 15 15 15   CREATININE 0.8 0.9 0.9  CALCIUM  8.5* 9.2 8.9   Liver Function  Tests: Recent Labs    04/13/23 0000  AST 13  ALT 5*  ALKPHOS 77  ALBUMIN 3.2*   No results for input(s): LIPASE, AMYLASE in the last  8760 hours. No results for input(s): AMMONIA in the last 8760 hours. CBC: Recent Labs    04/13/23 0000  WBC 6.5  HGB 11.0*  HCT 33*  PLT 169   Lipid Panel: No results for input(s): CHOL, HDL, LDLCALC, TRIG, CHOLHDL, LDLDIRECT in the last 8760 hours. No results found for: HGBA1C  Procedures since last visit: No results found.  Assessment/Plan 1. Bilateral leg edema (Primary) Low Dose of Lasix  with Compression stocking BNP 145 in past 2. Pure hypercholesterolemia On statin  3. Gastroesophageal reflux disease without esophagitis On Prilosec  4. Moderate late onset Alzheimer's dementia without behavioral disturbance, psychotic disturbance, mood disturbance, or anxiety (HCC) Namenda  and Galatamine  5. Recurrent depression Lexapro   6. Chronic obstructive pulmonary disease, unspecified COPD type (HCC) O2 at night  7. Vitamin B 12 deficiency On PO supplement    Labs/tests ordered:  CMP,Lipid,CBC,B 12 level Vit D level,TSH Next appt:  Visit date not found        [1]  Allergies Allergen Reactions   Albuterol Other (See Comments)    Had some rapid heartbeat in 2013 when she took it for some bronchitis   Diltiazem Other (See Comments)    Pre-syncope, bp went a bit low for bp on 180 mg dose   Naproxen Swelling   Other Nausea Only and Other (See Comments)    AN UNNAMED PAIN MEDICATION CAUSED NAUSEA   Yellow Dye #6 (Sunset Yellow)     #10   Cardizem [Diltiazem Hcl] Other (See Comments)    Hypotension   Nitrofurantoin Rash and Other (See Comments)    Sept., 2018     "
